# Patient Record
Sex: Female | Born: 1945 | Race: Black or African American | Hispanic: No | Smoking: Never smoker
Health system: Southern US, Community
[De-identification: ages and names within clinical notes are randomized; demographics above are authoritative.]

## PROBLEM LIST (undated history)

## (undated) DIAGNOSIS — E119 Type 2 diabetes mellitus without complications: Secondary | ICD-10-CM

## (undated) DIAGNOSIS — F329 Major depressive disorder, single episode, unspecified: Secondary | ICD-10-CM

## (undated) DIAGNOSIS — D649 Anemia, unspecified: Secondary | ICD-10-CM

## (undated) DIAGNOSIS — F32A Depression, unspecified: Secondary | ICD-10-CM

## (undated) DIAGNOSIS — I350 Nonrheumatic aortic (valve) stenosis: Secondary | ICD-10-CM

## (undated) DIAGNOSIS — H9190 Unspecified hearing loss, unspecified ear: Secondary | ICD-10-CM

## (undated) DIAGNOSIS — I639 Cerebral infarction, unspecified: Secondary | ICD-10-CM

## (undated) DIAGNOSIS — IMO0001 Reserved for inherently not codable concepts without codable children: Secondary | ICD-10-CM

## (undated) DIAGNOSIS — E785 Hyperlipidemia, unspecified: Secondary | ICD-10-CM

## (undated) DIAGNOSIS — F0390 Unspecified dementia without behavioral disturbance: Secondary | ICD-10-CM

## (undated) DIAGNOSIS — I5022 Chronic systolic (congestive) heart failure: Secondary | ICD-10-CM

## (undated) DIAGNOSIS — I219 Acute myocardial infarction, unspecified: Secondary | ICD-10-CM

## (undated) DIAGNOSIS — K219 Gastro-esophageal reflux disease without esophagitis: Secondary | ICD-10-CM

## (undated) DIAGNOSIS — I2510 Atherosclerotic heart disease of native coronary artery without angina pectoris: Secondary | ICD-10-CM

## (undated) DIAGNOSIS — N2581 Secondary hyperparathyroidism of renal origin: Secondary | ICD-10-CM

## (undated) DIAGNOSIS — I10 Essential (primary) hypertension: Secondary | ICD-10-CM

## (undated) DIAGNOSIS — N186 End stage renal disease: Secondary | ICD-10-CM

## (undated) DIAGNOSIS — Z8673 Personal history of transient ischemic attack (TIA), and cerebral infarction without residual deficits: Secondary | ICD-10-CM

## (undated) HISTORY — DX: Essential (primary) hypertension: I10

## (undated) HISTORY — DX: Type 2 diabetes mellitus without complications: E11.9

## (undated) HISTORY — DX: Unspecified dementia, unspecified severity, without behavioral disturbance, psychotic disturbance, mood disturbance, and anxiety: F03.90

## (undated) HISTORY — PX: CATARACT EXTRACTION, BILATERAL: SHX1313

## (undated) HISTORY — PX: WRIST SURGERY: SHX841

## (undated) HISTORY — DX: Unspecified hearing loss, unspecified ear: H91.90

## (undated) HISTORY — DX: Personal history of transient ischemic attack (TIA), and cerebral infarction without residual deficits: Z86.73

## (undated) HISTORY — DX: Anemia, unspecified: D64.9

## (undated) HISTORY — DX: Hyperlipidemia, unspecified: E78.5

## (undated) HISTORY — DX: Cerebral infarction, unspecified: I63.9

## (undated) HISTORY — DX: Atherosclerotic heart disease of native coronary artery without angina pectoris: I25.10

## (undated) HISTORY — DX: Chronic systolic (congestive) heart failure: I50.22

## (undated) HISTORY — DX: End stage renal disease: N18.6

## (undated) HISTORY — DX: Major depressive disorder, single episode, unspecified: F32.9

## (undated) HISTORY — DX: Gastro-esophageal reflux disease without esophagitis: K21.9

## (undated) HISTORY — DX: Depression, unspecified: F32.A

## (undated) HISTORY — DX: Secondary hyperparathyroidism of renal origin: N25.81

## (undated) HISTORY — DX: Acute myocardial infarction, unspecified: I21.9

## (undated) HISTORY — DX: Reserved for inherently not codable concepts without codable children: IMO0001

---

## 2009-11-25 ENCOUNTER — Inpatient Hospital Stay: Payer: Self-pay | Admitting: Internal Medicine

## 2009-12-20 ENCOUNTER — Encounter: Payer: Self-pay | Admitting: Family Medicine

## 2009-12-23 ENCOUNTER — Ambulatory Visit: Payer: Self-pay | Admitting: Internal Medicine

## 2010-01-18 ENCOUNTER — Other Ambulatory Visit: Payer: Self-pay | Admitting: Family Medicine

## 2010-01-31 ENCOUNTER — Other Ambulatory Visit: Payer: Self-pay | Admitting: Family Medicine

## 2012-03-24 ENCOUNTER — Inpatient Hospital Stay: Payer: Self-pay | Admitting: Internal Medicine

## 2012-03-24 LAB — COMPREHENSIVE METABOLIC PANEL
Alkaline Phosphatase: 84 U/L (ref 50–136)
Anion Gap: 14 (ref 7–16)
BUN: 67 mg/dL — ABNORMAL HIGH (ref 7–18)
Bilirubin,Total: 0.3 mg/dL (ref 0.2–1.0)
Chloride: 95 mmol/L — ABNORMAL LOW (ref 98–107)
Co2: 25 mmol/L (ref 21–32)
Creatinine: 9.04 mg/dL — ABNORMAL HIGH (ref 0.60–1.30)
EGFR (Non-African Amer.): 4 — ABNORMAL LOW
Glucose: 129 mg/dL — ABNORMAL HIGH (ref 65–99)
Potassium: 4.6 mmol/L (ref 3.5–5.1)
SGOT(AST): 27 U/L (ref 15–37)
SGPT (ALT): 19 U/L (ref 12–78)
Sodium: 134 mmol/L — ABNORMAL LOW (ref 136–145)
Total Protein: 8.3 g/dL — ABNORMAL HIGH (ref 6.4–8.2)

## 2012-03-24 LAB — CBC
HGB: 10.9 g/dL — ABNORMAL LOW (ref 12.0–16.0)
MCHC: 33.7 g/dL (ref 32.0–36.0)
Platelet: 242 10*3/uL (ref 150–440)
RDW: 17.5 % — ABNORMAL HIGH (ref 11.5–14.5)
WBC: 5.3 10*3/uL (ref 3.6–11.0)

## 2012-03-24 LAB — URINALYSIS, COMPLETE
Glucose,UR: NEGATIVE mg/dL (ref 0–75)
Protein: 100
Specific Gravity: 1.011 (ref 1.003–1.030)
Squamous Epithelial: 2
WBC UR: 16 /HPF (ref 0–5)

## 2012-03-24 LAB — CK TOTAL AND CKMB (NOT AT ARMC)
CK, Total: 128 U/L (ref 21–215)
CK-MB: 4.2 ng/mL — ABNORMAL HIGH (ref 0.5–3.6)

## 2012-03-25 LAB — LIPID PANEL
Cholesterol: 203 mg/dL — ABNORMAL HIGH (ref 0–200)
Triglycerides: 84 mg/dL (ref 0–200)
VLDL Cholesterol, Calc: 17 mg/dL (ref 5–40)

## 2012-03-25 LAB — RENAL FUNCTION PANEL
Albumin: 3.3 g/dL — ABNORMAL LOW (ref 3.4–5.0)
Anion Gap: 12 (ref 7–16)
BUN: 81 mg/dL — ABNORMAL HIGH (ref 7–18)
Chloride: 97 mmol/L — ABNORMAL LOW (ref 98–107)
Co2: 26 mmol/L (ref 21–32)
Creatinine: 10.64 mg/dL — ABNORMAL HIGH (ref 0.60–1.30)
Potassium: 4.5 mmol/L (ref 3.5–5.1)
Sodium: 135 mmol/L — ABNORMAL LOW (ref 136–145)

## 2012-03-25 LAB — PHOSPHORUS: Phosphorus: 4.8 mg/dL (ref 2.5–4.9)

## 2012-03-27 LAB — URINE CULTURE

## 2012-04-25 ENCOUNTER — Ambulatory Visit: Payer: Self-pay | Admitting: Vascular Surgery

## 2012-04-25 LAB — BASIC METABOLIC PANEL
Anion Gap: 7 (ref 7–16)
Calcium, Total: 8.7 mg/dL (ref 8.5–10.1)
Chloride: 94 mmol/L — ABNORMAL LOW (ref 98–107)
Creatinine: 6.15 mg/dL — ABNORMAL HIGH (ref 0.60–1.30)
Potassium: 4 mmol/L (ref 3.5–5.1)

## 2012-04-25 LAB — CBC
HCT: 32.5 % — ABNORMAL LOW (ref 35.0–47.0)
HGB: 10.7 g/dL — ABNORMAL LOW (ref 12.0–16.0)
MCH: 32.3 pg (ref 26.0–34.0)
MCHC: 33 g/dL (ref 32.0–36.0)
MCV: 98 fL (ref 80–100)
RDW: 18.3 % — ABNORMAL HIGH (ref 11.5–14.5)
WBC: 4 10*3/uL (ref 3.6–11.0)

## 2012-05-12 ENCOUNTER — Emergency Department: Payer: Self-pay | Admitting: Emergency Medicine

## 2012-05-12 LAB — URINALYSIS, COMPLETE
Bacteria: NONE SEEN
Bilirubin,UR: NEGATIVE
Ketone: NEGATIVE
Nitrite: NEGATIVE
Ph: 5 (ref 4.5–8.0)
Protein: >=500
RBC,UR: 4 /HPF (ref 0–5)
Specific Gravity: 1.012 (ref 1.003–1.030)
Squamous Epithelial: 2
WBC UR: 15 /HPF (ref 0–5)

## 2012-05-12 LAB — CBC
HCT: 41.3 % (ref 35.0–47.0)
HGB: 13.3 g/dL (ref 12.0–16.0)
MCV: 98 fL (ref 80–100)
RBC: 4.21 10*6/uL (ref 3.80–5.20)
WBC: 3.5 10*3/uL — ABNORMAL LOW (ref 3.6–11.0)

## 2012-05-12 LAB — COMPREHENSIVE METABOLIC PANEL
Albumin: 3.4 g/dL (ref 3.4–5.0)
Alkaline Phosphatase: 112 U/L (ref 50–136)
Anion Gap: 6 — ABNORMAL LOW (ref 7–16)
BUN: 33 mg/dL — ABNORMAL HIGH (ref 7–18)
Bilirubin,Total: 0.3 mg/dL (ref 0.2–1.0)
Calcium, Total: 8.7 mg/dL (ref 8.5–10.1)
Co2: 32 mmol/L (ref 21–32)
Creatinine: 5.88 mg/dL — ABNORMAL HIGH (ref 0.60–1.30)
EGFR (African American): 8 — ABNORMAL LOW
EGFR (Non-African Amer.): 7 — ABNORMAL LOW
Glucose: 88 mg/dL (ref 65–99)
Potassium: 3.7 mmol/L (ref 3.5–5.1)
SGPT (ALT): 18 U/L (ref 12–78)
Total Protein: 7.6 g/dL (ref 6.4–8.2)

## 2012-05-12 LAB — CK TOTAL AND CKMB (NOT AT ARMC)
CK, Total: 50 U/L (ref 21–215)
CK-MB: 1.3 ng/mL (ref 0.5–3.6)

## 2012-05-17 ENCOUNTER — Ambulatory Visit: Payer: Self-pay | Admitting: Vascular Surgery

## 2012-05-20 ENCOUNTER — Inpatient Hospital Stay: Payer: Self-pay | Admitting: Family Medicine

## 2012-05-20 LAB — URINALYSIS, COMPLETE
Glucose,UR: NEGATIVE mg/dL (ref 0–75)
Ketone: NEGATIVE
Leukocyte Esterase: NEGATIVE
Ph: 5 (ref 4.5–8.0)
Protein: >=500
RBC,UR: 3 /HPF (ref 0–5)
Specific Gravity: 1.013 (ref 1.003–1.030)
WBC UR: 1 /HPF (ref 0–5)

## 2012-05-20 LAB — TROPONIN I: Troponin-I: 0.49 ng/mL — ABNORMAL HIGH

## 2012-05-20 LAB — COMPREHENSIVE METABOLIC PANEL
Anion Gap: 9 (ref 7–16)
Bilirubin,Total: 0.4 mg/dL (ref 0.2–1.0)
Calcium, Total: 8.1 mg/dL — ABNORMAL LOW (ref 8.5–10.1)
Chloride: 98 mmol/L (ref 98–107)
EGFR (Non-African Amer.): 13 — ABNORMAL LOW
Glucose: 95 mg/dL (ref 65–99)
Osmolality: 273 (ref 275–301)
SGOT(AST): 24 U/L (ref 15–37)
Sodium: 135 mmol/L — ABNORMAL LOW (ref 136–145)
Total Protein: 7.7 g/dL (ref 6.4–8.2)

## 2012-05-20 LAB — CK TOTAL AND CKMB (NOT AT ARMC)
CK, Total: 70 U/L (ref 21–215)
CK-MB: 2.2 ng/mL (ref 0.5–3.6)

## 2012-05-20 LAB — CBC
HGB: 12.5 g/dL (ref 12.0–16.0)
MCH: 31.2 pg (ref 26.0–34.0)
MCHC: 32.9 g/dL (ref 32.0–36.0)
MCV: 95 fL (ref 80–100)
Platelet: 157 10*3/uL (ref 150–440)
RBC: 4.01 10*6/uL (ref 3.80–5.20)

## 2012-05-21 DIAGNOSIS — I359 Nonrheumatic aortic valve disorder, unspecified: Secondary | ICD-10-CM

## 2012-05-21 LAB — BASIC METABOLIC PANEL
Anion Gap: 8 (ref 7–16)
BUN: 36 mg/dL — ABNORMAL HIGH (ref 7–18)
Calcium, Total: 8.1 mg/dL — ABNORMAL LOW (ref 8.5–10.1)
Chloride: 101 mmol/L (ref 98–107)
Co2: 28 mmol/L (ref 21–32)
Creatinine: 5.44 mg/dL — ABNORMAL HIGH (ref 0.60–1.30)
Glucose: 50 mg/dL — ABNORMAL LOW (ref 65–99)
Osmolality: 279 (ref 275–301)
Potassium: 3.4 mmol/L — ABNORMAL LOW (ref 3.5–5.1)
Sodium: 137 mmol/L (ref 136–145)

## 2012-05-21 LAB — CBC WITH DIFFERENTIAL/PLATELET
HCT: 35.8 % (ref 35.0–47.0)
Lymphocyte %: 23 %
MCV: 95 fL (ref 80–100)
Monocyte #: 0.8 x10 3/mm (ref 0.2–0.9)
Monocyte %: 15.9 %
Neutrophil #: 2.8 10*3/uL (ref 1.4–6.5)
Platelet: 172 10*3/uL (ref 150–440)
RBC: 3.79 10*6/uL — ABNORMAL LOW (ref 3.80–5.20)
RDW: 17.4 % — ABNORMAL HIGH (ref 11.5–14.5)
WBC: 4.8 10*3/uL (ref 3.6–11.0)

## 2012-05-21 LAB — CK TOTAL AND CKMB (NOT AT ARMC)
CK, Total: 61 U/L (ref 21–215)
CK, Total: 62 U/L (ref 21–215)
CK-MB: 1.6 ng/mL (ref 0.5–3.6)

## 2012-05-21 LAB — TROPONIN I: Troponin-I: 0.52 ng/mL — ABNORMAL HIGH

## 2012-05-24 LAB — PHOSPHORUS: Phosphorus: 3.5 mg/dL (ref 2.5–4.9)

## 2012-05-25 LAB — PLATELET COUNT: Platelet: 201 10*3/uL (ref 150–440)

## 2012-05-27 LAB — CULTURE, BLOOD (SINGLE)

## 2012-05-31 ENCOUNTER — Emergency Department: Payer: Self-pay | Admitting: Emergency Medicine

## 2012-06-01 LAB — COMPREHENSIVE METABOLIC PANEL
Albumin: 3.5 g/dL (ref 3.4–5.0)
Alkaline Phosphatase: 114 U/L (ref 50–136)
BUN: 30 mg/dL — ABNORMAL HIGH (ref 7–18)
Co2: 32 mmol/L (ref 21–32)
EGFR (African American): 12 — ABNORMAL LOW
Osmolality: 288 (ref 275–301)
SGPT (ALT): 24 U/L (ref 12–78)
Sodium: 140 mmol/L (ref 136–145)

## 2012-06-01 LAB — URINALYSIS, COMPLETE
Bacteria: NONE SEEN
Bilirubin,UR: NEGATIVE
Glucose,UR: 50 mg/dL (ref 0–75)
Hyaline Cast: 9
Ketone: NEGATIVE
Nitrite: NEGATIVE
Ph: 7 (ref 4.5–8.0)
Protein: >=500
RBC,UR: 5 /HPF (ref 0–5)
WBC UR: 2 /HPF (ref 0–5)

## 2012-06-01 LAB — CBC
HGB: 11.3 g/dL — ABNORMAL LOW (ref 12.0–16.0)
MCHC: 32.3 g/dL (ref 32.0–36.0)
MCV: 93 fL (ref 80–100)
Platelet: 291 10*3/uL (ref 150–440)
RDW: 17.9 % — ABNORMAL HIGH (ref 11.5–14.5)

## 2012-07-30 ENCOUNTER — Ambulatory Visit: Payer: Self-pay | Admitting: Vascular Surgery

## 2012-11-05 ENCOUNTER — Ambulatory Visit: Payer: Self-pay | Admitting: Vascular Surgery

## 2012-12-10 ENCOUNTER — Emergency Department: Payer: Self-pay | Admitting: Emergency Medicine

## 2012-12-10 LAB — CBC
HCT: 34.9 % — ABNORMAL LOW (ref 35.0–47.0)
MCH: 31.2 pg (ref 26.0–34.0)
MCHC: 34.2 g/dL (ref 32.0–36.0)
Platelet: 214 10*3/uL (ref 150–440)

## 2012-12-10 LAB — COMPREHENSIVE METABOLIC PANEL
Alkaline Phosphatase: 103 U/L (ref 50–136)
BUN: 43 mg/dL — ABNORMAL HIGH (ref 7–18)
Bilirubin,Total: 0.5 mg/dL (ref 0.2–1.0)
Calcium, Total: 8.7 mg/dL (ref 8.5–10.1)
Creatinine: 5.53 mg/dL — ABNORMAL HIGH (ref 0.60–1.30)
EGFR (African American): 9 — ABNORMAL LOW
EGFR (Non-African Amer.): 7 — ABNORMAL LOW
Potassium: 3.2 mmol/L — ABNORMAL LOW (ref 3.5–5.1)
SGOT(AST): 25 U/L (ref 15–37)
SGPT (ALT): 25 U/L (ref 12–78)
Sodium: 134 mmol/L — ABNORMAL LOW (ref 136–145)
Total Protein: 7.5 g/dL (ref 6.4–8.2)

## 2012-12-10 LAB — URINALYSIS, COMPLETE
Bacteria: NONE SEEN
Glucose,UR: 50 mg/dL (ref 0–75)
Ketone: NEGATIVE
Nitrite: NEGATIVE
Ph: 5 (ref 4.5–8.0)
Protein: >=500
RBC,UR: 1 /HPF (ref 0–5)
Specific Gravity: 1.011 (ref 1.003–1.030)
Squamous Epithelial: 2

## 2013-06-29 ENCOUNTER — Observation Stay: Payer: Self-pay | Admitting: Specialist

## 2013-06-29 LAB — URINALYSIS, COMPLETE
BILIRUBIN, UR: NEGATIVE
BLOOD: NEGATIVE
Bacteria: NONE SEEN
Glucose,UR: 50 mg/dL (ref 0–75)
KETONE: NEGATIVE
LEUKOCYTE ESTERASE: NEGATIVE
Nitrite: NEGATIVE
PH: 7 (ref 4.5–8.0)
RBC,UR: 5 /HPF (ref 0–5)
Specific Gravity: 1.016 (ref 1.003–1.030)
Squamous Epithelial: <1

## 2013-06-29 LAB — BASIC METABOLIC PANEL
Anion Gap: 6 — ABNORMAL LOW (ref 7–16)
BUN: 38 mg/dL — ABNORMAL HIGH (ref 7–18)
CHLORIDE: 98 mmol/L (ref 98–107)
CO2: 32 mmol/L (ref 21–32)
Calcium, Total: 8.7 mg/dL (ref 8.5–10.1)
Creatinine: 6.08 mg/dL — ABNORMAL HIGH (ref 0.60–1.30)
EGFR (African American): 8 — ABNORMAL LOW
GFR CALC NON AF AMER: 7 — AB
GLUCOSE: 98 mg/dL (ref 65–99)
Osmolality: 281 (ref 275–301)
Potassium: 3.2 mmol/L — ABNORMAL LOW (ref 3.5–5.1)
Sodium: 136 mmol/L (ref 136–145)

## 2013-06-29 LAB — CBC
HCT: 41.8 % (ref 35.0–47.0)
HGB: 13.1 g/dL (ref 12.0–16.0)
MCH: 29.8 pg (ref 26.0–34.0)
MCHC: 31.4 g/dL — ABNORMAL LOW (ref 32.0–36.0)
MCV: 95 fL (ref 80–100)
Platelet: 163 10*3/uL (ref 150–440)
RBC: 4.41 10*6/uL (ref 3.80–5.20)
RDW: 14.1 % (ref 11.5–14.5)
WBC: 2.8 10*3/uL — ABNORMAL LOW (ref 3.6–11.0)

## 2013-06-29 LAB — TROPONIN I: TROPONIN-I: 0.02 ng/mL

## 2013-06-29 LAB — CLOSTRIDIUM DIFFICILE(ARMC)

## 2013-06-29 LAB — COMPREHENSIVE METABOLIC PANEL
ALBUMIN: 3.9 g/dL (ref 3.4–5.0)
Alkaline Phosphatase: 83 U/L
Anion Gap: 6 — ABNORMAL LOW (ref 7–16)
BUN: 32 mg/dL — AB (ref 7–18)
Bilirubin,Total: 0.4 mg/dL (ref 0.2–1.0)
CALCIUM: 9.4 mg/dL (ref 8.5–10.1)
Chloride: 95 mmol/L — ABNORMAL LOW (ref 98–107)
Co2: 32 mmol/L (ref 21–32)
Creatinine: 5.54 mg/dL — ABNORMAL HIGH (ref 0.60–1.30)
EGFR (African American): 9 — ABNORMAL LOW
EGFR (Non-African Amer.): 7 — ABNORMAL LOW
GLUCOSE: 137 mg/dL — AB (ref 65–99)
Osmolality: 275 (ref 275–301)
Potassium: 3.1 mmol/L — ABNORMAL LOW (ref 3.5–5.1)
SGOT(AST): 32 U/L (ref 15–37)
SGPT (ALT): 27 U/L (ref 12–78)
Sodium: 133 mmol/L — ABNORMAL LOW (ref 136–145)
Total Protein: 8.5 g/dL — ABNORMAL HIGH (ref 6.4–8.2)

## 2013-06-29 LAB — LIPASE, BLOOD: Lipase: 155 U/L (ref 73–393)

## 2013-06-30 LAB — CBC WITH DIFFERENTIAL/PLATELET
Basophil #: 0 10*3/uL (ref 0.0–0.1)
Basophil %: 0.5 %
Eosinophil #: 0 10*3/uL (ref 0.0–0.7)
Eosinophil %: 0.7 %
HCT: 32.3 % — ABNORMAL LOW (ref 35.0–47.0)
HGB: 10.4 g/dL — ABNORMAL LOW (ref 12.0–16.0)
Lymphocyte #: 0.6 10*3/uL — ABNORMAL LOW (ref 1.0–3.6)
Lymphocyte %: 18.1 %
MCH: 30.1 pg (ref 26.0–34.0)
MCHC: 32.1 g/dL (ref 32.0–36.0)
MCV: 94 fL (ref 80–100)
Monocyte #: 0.5 x10 3/mm (ref 0.2–0.9)
Monocyte %: 14.4 %
NEUTROS PCT: 66.3 %
Neutrophil #: 2.3 10*3/uL (ref 1.4–6.5)
Platelet: 148 10*3/uL — ABNORMAL LOW (ref 150–440)
RBC: 3.45 10*6/uL — AB (ref 3.80–5.20)
RDW: 14.3 % (ref 11.5–14.5)
WBC: 3.7 10*3/uL (ref 3.6–11.0)

## 2013-06-30 LAB — BASIC METABOLIC PANEL
ANION GAP: 9 (ref 7–16)
BUN: 45 mg/dL — AB (ref 7–18)
CALCIUM: 8.2 mg/dL — AB (ref 8.5–10.1)
CHLORIDE: 98 mmol/L (ref 98–107)
Co2: 30 mmol/L (ref 21–32)
Creatinine: 6.84 mg/dL — ABNORMAL HIGH (ref 0.60–1.30)
EGFR (African American): 7 — ABNORMAL LOW
GFR CALC NON AF AMER: 6 — AB
Glucose: 111 mg/dL — ABNORMAL HIGH (ref 65–99)
Osmolality: 286 (ref 275–301)
POTASSIUM: 2.7 mmol/L — AB (ref 3.5–5.1)
SODIUM: 137 mmol/L (ref 136–145)

## 2013-06-30 LAB — CLOSTRIDIUM DIFFICILE(ARMC)

## 2013-06-30 LAB — PHOSPHORUS: PHOSPHORUS: 3.9 mg/dL (ref 2.5–4.9)

## 2013-07-01 LAB — BASIC METABOLIC PANEL
Anion Gap: 4 — ABNORMAL LOW (ref 7–16)
BUN: 27 mg/dL — AB (ref 7–18)
CALCIUM: 8.2 mg/dL — AB (ref 8.5–10.1)
Chloride: 103 mmol/L (ref 98–107)
Co2: 32 mmol/L (ref 21–32)
Creatinine: 5.18 mg/dL — ABNORMAL HIGH (ref 0.60–1.30)
EGFR (Non-African Amer.): 8 — ABNORMAL LOW
GFR CALC AF AMER: 9 — AB
Glucose: 95 mg/dL (ref 65–99)
OSMOLALITY: 282 (ref 275–301)
POTASSIUM: 3.9 mmol/L (ref 3.5–5.1)
Sodium: 139 mmol/L (ref 136–145)

## 2013-07-01 LAB — STOOL CULTURE

## 2013-07-16 ENCOUNTER — Emergency Department: Payer: Self-pay | Admitting: Internal Medicine

## 2013-07-16 LAB — COMPREHENSIVE METABOLIC PANEL
ALBUMIN: 3.6 g/dL (ref 3.4–5.0)
ALT: 24 U/L (ref 12–78)
ANION GAP: 4 — AB (ref 7–16)
Alkaline Phosphatase: 107 U/L
BUN: 17 mg/dL (ref 7–18)
Bilirubin,Total: 0.4 mg/dL (ref 0.2–1.0)
CHLORIDE: 100 mmol/L (ref 98–107)
CREATININE: 3.04 mg/dL — AB (ref 0.60–1.30)
Calcium, Total: 8.7 mg/dL (ref 8.5–10.1)
Co2: 34 mmol/L — ABNORMAL HIGH (ref 21–32)
EGFR (African American): 18 — ABNORMAL LOW
GFR CALC NON AF AMER: 15 — AB
Glucose: 159 mg/dL — ABNORMAL HIGH (ref 65–99)
OSMOLALITY: 281 (ref 275–301)
Potassium: 3.6 mmol/L (ref 3.5–5.1)
SGOT(AST): 79 U/L — ABNORMAL HIGH (ref 15–37)
Sodium: 138 mmol/L (ref 136–145)
Total Protein: 8.1 g/dL (ref 6.4–8.2)

## 2013-07-16 LAB — CBC
HCT: 31.6 % — ABNORMAL LOW (ref 35.0–47.0)
HGB: 10.2 g/dL — ABNORMAL LOW (ref 12.0–16.0)
MCH: 30.8 pg (ref 26.0–34.0)
MCHC: 32.5 g/dL (ref 32.0–36.0)
MCV: 95 fL (ref 80–100)
PLATELETS: 208 10*3/uL (ref 150–440)
RBC: 3.33 10*6/uL — ABNORMAL LOW (ref 3.80–5.20)
RDW: 15 % — ABNORMAL HIGH (ref 11.5–14.5)
WBC: 6.8 10*3/uL (ref 3.6–11.0)

## 2013-08-29 ENCOUNTER — Ambulatory Visit: Payer: Self-pay | Admitting: Vascular Surgery

## 2014-01-01 ENCOUNTER — Ambulatory Visit: Payer: Self-pay | Admitting: Physician Assistant

## 2014-04-21 ENCOUNTER — Inpatient Hospital Stay: Payer: Self-pay | Admitting: Specialist

## 2014-04-21 LAB — CBC WITH DIFFERENTIAL/PLATELET
BASOS ABS: 0.1 10*3/uL (ref 0.0–0.1)
BASOS PCT: 2.5 %
EOS ABS: 0.1 10*3/uL (ref 0.0–0.7)
Eosinophil %: 2 %
HCT: 33 % — ABNORMAL LOW (ref 35.0–47.0)
HGB: 11 g/dL — ABNORMAL LOW (ref 12.0–16.0)
Lymphocyte #: 0.9 10*3/uL — ABNORMAL LOW (ref 1.0–3.6)
Lymphocyte %: 19.5 %
MCH: 30 pg (ref 26.0–34.0)
MCHC: 33.2 g/dL (ref 32.0–36.0)
MCV: 90 fL (ref 80–100)
MONO ABS: 0.6 x10 3/mm (ref 0.2–0.9)
Monocyte %: 12 %
NEUTROS PCT: 64 %
Neutrophil #: 3.1 10*3/uL (ref 1.4–6.5)
PLATELETS: 128 10*3/uL — AB (ref 150–440)
RBC: 3.66 10*6/uL — ABNORMAL LOW (ref 3.80–5.20)
RDW: 17.2 % — AB (ref 11.5–14.5)
WBC: 4.8 10*3/uL (ref 3.6–11.0)

## 2014-04-21 LAB — TROPONIN I
Troponin-I: 21 ng/mL — ABNORMAL HIGH
Troponin-I: 20 ng/mL — ABNORMAL HIGH

## 2014-04-21 LAB — BASIC METABOLIC PANEL
ANION GAP: 8 (ref 7–16)
BUN: 44 mg/dL — ABNORMAL HIGH (ref 7–18)
CALCIUM: 9.2 mg/dL (ref 8.5–10.1)
CO2: 33 mmol/L — AB (ref 21–32)
Chloride: 93 mmol/L — ABNORMAL LOW (ref 98–107)
Creatinine: 5.72 mg/dL — ABNORMAL HIGH (ref 0.60–1.30)
EGFR (Non-African Amer.): 8 — ABNORMAL LOW
GFR CALC AF AMER: 9 — AB
Glucose: 159 mg/dL — ABNORMAL HIGH (ref 65–99)
OSMOLALITY: 283 (ref 275–301)
Potassium: 4.4 mmol/L (ref 3.5–5.1)
Sodium: 134 mmol/L — ABNORMAL LOW (ref 136–145)

## 2014-04-21 LAB — PROTIME-INR
INR: 0.9
Prothrombin Time: 12.6 secs

## 2014-04-21 LAB — APTT: ACTIVATED PTT: 35.3 s (ref 23.6–35.9)

## 2014-04-21 LAB — CK-MB
CK-MB: 7.5 ng/mL — ABNORMAL HIGH (ref 0.5–3.6)
CK-MB: 5.2 ng/mL — ABNORMAL HIGH (ref 0.5–3.6)

## 2014-04-22 ENCOUNTER — Other Ambulatory Visit: Payer: Self-pay | Admitting: Physician Assistant

## 2014-04-22 DIAGNOSIS — E785 Hyperlipidemia, unspecified: Secondary | ICD-10-CM

## 2014-04-22 DIAGNOSIS — N186 End stage renal disease: Secondary | ICD-10-CM

## 2014-04-22 DIAGNOSIS — I340 Nonrheumatic mitral (valve) insufficiency: Secondary | ICD-10-CM

## 2014-04-22 DIAGNOSIS — I214 Non-ST elevation (NSTEMI) myocardial infarction: Secondary | ICD-10-CM

## 2014-04-22 DIAGNOSIS — I10 Essential (primary) hypertension: Secondary | ICD-10-CM

## 2014-04-22 LAB — URINALYSIS, COMPLETE
Bilirubin,UR: NEGATIVE
Blood: NEGATIVE
Glucose,UR: 150 mg/dL (ref 0–75)
Ketone: NEGATIVE
Nitrite: NEGATIVE
PH: 6 (ref 4.5–8.0)
Protein: >=500
Specific Gravity: 1.022 (ref 1.003–1.030)
Squamous Epithelial: 4
WBC UR: 23 /HPF (ref 0–5)

## 2014-04-22 LAB — CBC WITH DIFFERENTIAL/PLATELET
BASOS PCT: 1.5 %
BASOS PCT: 1.2 %
Basophil #: 0.1 10*3/uL (ref 0.0–0.1)
Basophil #: 0 10*3/uL (ref 0.0–0.1)
EOS ABS: 0.1 10*3/uL (ref 0.0–0.7)
EOS PCT: 4.3 %
Eosinophil #: 0.2 10*3/uL (ref 0.0–0.7)
Eosinophil %: 3.4 %
HCT: 24.5 % — ABNORMAL LOW (ref 35.0–47.0)
HCT: 25.5 % — AB (ref 35.0–47.0)
HGB: 8.1 g/dL — AB (ref 12.0–16.0)
HGB: 8.5 g/dL — AB (ref 12.0–16.0)
LYMPHS ABS: 0.9 10*3/uL — AB (ref 1.0–3.6)
LYMPHS ABS: 0.9 10*3/uL — AB (ref 1.0–3.6)
Lymphocyte %: 24 %
Lymphocyte %: 23.9 %
MCH: 29.5 pg (ref 26.0–34.0)
MCH: 29.7 pg (ref 26.0–34.0)
MCHC: 33 g/dL (ref 32.0–36.0)
MCHC: 33.4 g/dL (ref 32.0–36.0)
MCV: 89 fL (ref 80–100)
MCV: 89 fL (ref 80–100)
MONO ABS: 0.5 x10 3/mm (ref 0.2–0.9)
MONO ABS: 0.5 x10 3/mm (ref 0.2–0.9)
Monocyte %: 12.6 %
Monocyte %: 12.8 %
NEUTROS ABS: 2.3 10*3/uL (ref 1.4–6.5)
NEUTROS PCT: 58.5 %
Neutrophil #: 2.2 10*3/uL (ref 1.4–6.5)
Neutrophil %: 57.8 %
Platelet: 98 10*3/uL — ABNORMAL LOW (ref 150–440)
Platelet: 106 10*3/uL — ABNORMAL LOW (ref 150–440)
RBC: 2.74 10*6/uL — ABNORMAL LOW (ref 3.80–5.20)
RBC: 2.86 10*6/uL — ABNORMAL LOW (ref 3.80–5.20)
RDW: 17 % — ABNORMAL HIGH (ref 11.5–14.5)
RDW: 16.9 % — ABNORMAL HIGH (ref 11.5–14.5)
WBC: 3.9 10*3/uL (ref 3.6–11.0)
WBC: 3.7 10*3/uL (ref 3.6–11.0)

## 2014-04-22 LAB — RENAL FUNCTION PANEL
ANION GAP: 4 — AB (ref 7–16)
Albumin: 2.8 g/dL — ABNORMAL LOW (ref 3.4–5.0)
BUN: 52 mg/dL — AB (ref 7–18)
CALCIUM: 8.1 mg/dL — AB (ref 8.5–10.1)
Chloride: 93 mmol/L — ABNORMAL LOW (ref 98–107)
Co2: 35 mmol/L — ABNORMAL HIGH (ref 21–32)
Creatinine: 6.6 mg/dL — ABNORMAL HIGH (ref 0.60–1.30)
GFR CALC AF AMER: 8 — AB
GFR CALC NON AF AMER: 7 — AB
Glucose: 195 mg/dL — ABNORMAL HIGH (ref 65–99)
OSMOLALITY: 284 (ref 275–301)
Phosphorus: 3.9 mg/dL (ref 2.5–4.9)
Potassium: 4.5 mmol/L (ref 3.5–5.1)
SODIUM: 132 mmol/L — AB (ref 136–145)

## 2014-04-22 LAB — BASIC METABOLIC PANEL
ANION GAP: 10 (ref 7–16)
BUN: 50 mg/dL — AB (ref 7–18)
CHLORIDE: 92 mmol/L — AB (ref 98–107)
Calcium, Total: 8.8 mg/dL (ref 8.5–10.1)
Co2: 32 mmol/L (ref 21–32)
Creatinine: 6.23 mg/dL — ABNORMAL HIGH (ref 0.60–1.30)
EGFR (African American): 9 — ABNORMAL LOW
EGFR (Non-African Amer.): 7 — ABNORMAL LOW
Glucose: 91 mg/dL (ref 65–99)
Osmolality: 281 (ref 275–301)
POTASSIUM: 4.1 mmol/L (ref 3.5–5.1)
Sodium: 134 mmol/L — ABNORMAL LOW (ref 136–145)

## 2014-04-22 LAB — LIPID PANEL
Cholesterol: 256 mg/dL — ABNORMAL HIGH (ref 0–200)
HDL: 73 mg/dL — AB (ref 40–60)
Ldl Cholesterol, Calc: 179 mg/dL — ABNORMAL HIGH (ref 0–100)
TRIGLYCERIDES: 22 mg/dL (ref 0–200)
VLDL CHOLESTEROL, CALC: 4 mg/dL — AB (ref 5–40)

## 2014-04-22 LAB — MAGNESIUM: MAGNESIUM: 2.1 mg/dL

## 2014-04-22 LAB — HEPARIN LEVEL (UNFRACTIONATED)
ANTI-XA(UNFRACTIONATED): 0.75 [IU]/mL — AB (ref 0.30–0.70)
Anti-Xa(Unfractionated): 0.22 IU/mL — ABNORMAL LOW (ref 0.30–0.70)

## 2014-04-22 LAB — HEMOGLOBIN A1C: Hemoglobin A1C: 5.1 % (ref 4.2–6.3)

## 2014-04-22 LAB — TSH: Thyroid Stimulating Horm: 1.08 u[IU]/mL

## 2014-04-22 LAB — TROPONIN I: Troponin-I: 21 ng/mL — ABNORMAL HIGH

## 2014-04-22 LAB — CK-MB: CK-MB: 4.3 ng/mL — AB (ref 0.5–3.6)

## 2014-04-23 LAB — CBC WITH DIFFERENTIAL/PLATELET
Basophil #: 0.1 10*3/uL (ref 0.0–0.1)
Basophil %: 1.5 %
EOS PCT: 7.7 %
Eosinophil #: 0.3 10*3/uL (ref 0.0–0.7)
HCT: 25.7 % — ABNORMAL LOW (ref 35.0–47.0)
HGB: 8.4 g/dL — ABNORMAL LOW (ref 12.0–16.0)
Lymphocyte #: 1.2 10*3/uL (ref 1.0–3.6)
Lymphocyte %: 33 %
MCH: 29.5 pg (ref 26.0–34.0)
MCHC: 32.7 g/dL (ref 32.0–36.0)
MCV: 90 fL (ref 80–100)
MONOS PCT: 10.7 %
Monocyte #: 0.4 x10 3/mm (ref 0.2–0.9)
NEUTROS ABS: 1.7 10*3/uL (ref 1.4–6.5)
Neutrophil %: 47.1 %
Platelet: 115 10*3/uL — ABNORMAL LOW (ref 150–440)
RBC: 2.84 10*6/uL — ABNORMAL LOW (ref 3.80–5.20)
RDW: 17 % — AB (ref 11.5–14.5)
WBC: 3.7 10*3/uL (ref 3.6–11.0)

## 2014-04-23 LAB — HEPARIN LEVEL (UNFRACTIONATED): Anti-Xa(Unfractionated): 0.33 IU/mL (ref 0.30–0.70)

## 2014-04-24 LAB — BASIC METABOLIC PANEL
Anion Gap: 9 (ref 7–16)
BUN: 48 mg/dL — AB (ref 7–18)
CHLORIDE: 94 mmol/L — AB (ref 98–107)
CREATININE: 5.9 mg/dL — AB (ref 0.60–1.30)
Calcium, Total: 8.1 mg/dL — ABNORMAL LOW (ref 8.5–10.1)
Co2: 30 mmol/L (ref 21–32)
EGFR (Non-African Amer.): 8 — ABNORMAL LOW
GFR CALC AF AMER: 9 — AB
Glucose: 111 mg/dL — ABNORMAL HIGH (ref 65–99)
Osmolality: 280 (ref 275–301)
Potassium: 4.5 mmol/L (ref 3.5–5.1)
Sodium: 133 mmol/L — ABNORMAL LOW (ref 136–145)

## 2014-04-24 LAB — CBC WITH DIFFERENTIAL/PLATELET
BASOS ABS: 0.1 10*3/uL (ref 0.0–0.1)
Basophil #: 0 10*3/uL (ref 0.0–0.1)
Basophil %: 1.5 %
Basophil %: 1 %
EOS PCT: 6.4 %
Eosinophil #: 0.3 10*3/uL (ref 0.0–0.7)
Eosinophil #: 0.2 10*3/uL (ref 0.0–0.7)
Eosinophil %: 4.7 %
HCT: 24.3 % — ABNORMAL LOW (ref 35.0–47.0)
HCT: 25.1 % — ABNORMAL LOW (ref 35.0–47.0)
HGB: 8.1 g/dL — AB (ref 12.0–16.0)
HGB: 8.3 g/dL — ABNORMAL LOW (ref 12.0–16.0)
LYMPHS ABS: 0.7 10*3/uL — AB (ref 1.0–3.6)
Lymphocyte #: 0.9 10*3/uL — ABNORMAL LOW (ref 1.0–3.6)
Lymphocyte %: 23.3 %
Lymphocyte %: 16.9 %
MCH: 30 pg (ref 26.0–34.0)
MCH: 29.6 pg (ref 26.0–34.0)
MCHC: 33.2 g/dL (ref 32.0–36.0)
MCHC: 33 g/dL (ref 32.0–36.0)
MCV: 90 fL (ref 80–100)
MCV: 90 fL (ref 80–100)
Monocyte #: 0.5 x10 3/mm (ref 0.2–0.9)
Monocyte #: 0.4 x10 3/mm (ref 0.2–0.9)
Monocyte %: 11.7 %
Monocyte %: 9.7 %
NEUTROS ABS: 2.8 10*3/uL (ref 1.4–6.5)
NEUTROS PCT: 57.1 %
Neutrophil #: 2.3 10*3/uL (ref 1.4–6.5)
Neutrophil %: 67.7 %
Platelet: 122 10*3/uL — ABNORMAL LOW (ref 150–440)
Platelet: 123 10*3/uL — ABNORMAL LOW (ref 150–440)
RBC: 2.7 10*6/uL — ABNORMAL LOW (ref 3.80–5.20)
RBC: 2.8 10*6/uL — ABNORMAL LOW (ref 3.80–5.20)
RDW: 16.4 % — ABNORMAL HIGH (ref 11.5–14.5)
RDW: 16.7 % — ABNORMAL HIGH (ref 11.5–14.5)
WBC: 4 10*3/uL (ref 3.6–11.0)
WBC: 4.1 10*3/uL (ref 3.6–11.0)

## 2014-04-24 LAB — RENAL FUNCTION PANEL
ANION GAP: 10 (ref 7–16)
Albumin: 2.7 g/dL — ABNORMAL LOW (ref 3.4–5.0)
BUN: 48 mg/dL — ABNORMAL HIGH (ref 7–18)
CALCIUM: 8.1 mg/dL — AB (ref 8.5–10.1)
Chloride: 93 mmol/L — ABNORMAL LOW (ref 98–107)
Co2: 30 mmol/L (ref 21–32)
Creatinine: 6.12 mg/dL — ABNORMAL HIGH (ref 0.60–1.30)
EGFR (African American): 9 — ABNORMAL LOW
EGFR (Non-African Amer.): 7 — ABNORMAL LOW
GLUCOSE: 229 mg/dL — AB (ref 65–99)
OSMOLALITY: 286 (ref 275–301)
PHOSPHORUS: 2.9 mg/dL (ref 2.5–4.9)
Potassium: 4.5 mmol/L (ref 3.5–5.1)
Sodium: 133 mmol/L — ABNORMAL LOW (ref 136–145)

## 2014-04-27 ENCOUNTER — Telehealth: Payer: Self-pay

## 2014-04-27 NOTE — Telephone Encounter (Signed)
Patient contacted regarding discharge from [facility] on 04/24/14. Spoke w/ pt's daughter, Vena Austrialeanor.  Patient's daughter understands to follow up with Dr. Mariah MillingGollan on 04/30/14 at 3:15 at [facility]. Patient's daughter understands discharge instructions? yes Patient's daughter understands medications and regiment? yes Patient's daughter understands to bring all medications to this visit? yes  [name] reports that pt has dementia, is resident at [facility] and they will ensure that she gets to her appt.

## 2014-04-30 ENCOUNTER — Encounter: Payer: Medicare Other | Admitting: Cardiovascular Disease

## 2014-05-12 ENCOUNTER — Ambulatory Visit (INDEPENDENT_AMBULATORY_CARE_PROVIDER_SITE_OTHER): Payer: Medicare Other | Admitting: Cardiovascular Disease

## 2014-05-12 ENCOUNTER — Encounter: Payer: Self-pay | Admitting: Cardiovascular Disease

## 2014-05-12 VITALS — BP 110/62 | HR 67 | Ht 63.0 in | Wt 128.0 lb

## 2014-05-12 DIAGNOSIS — I5022 Chronic systolic (congestive) heart failure: Secondary | ICD-10-CM | POA: Insufficient documentation

## 2014-05-12 DIAGNOSIS — E1159 Type 2 diabetes mellitus with other circulatory complications: Secondary | ICD-10-CM | POA: Insufficient documentation

## 2014-05-12 DIAGNOSIS — I350 Nonrheumatic aortic (valve) stenosis: Secondary | ICD-10-CM | POA: Insufficient documentation

## 2014-05-12 DIAGNOSIS — N186 End stage renal disease: Secondary | ICD-10-CM | POA: Insufficient documentation

## 2014-05-12 DIAGNOSIS — I214 Non-ST elevation (NSTEMI) myocardial infarction: Secondary | ICD-10-CM

## 2014-05-12 DIAGNOSIS — I159 Secondary hypertension, unspecified: Secondary | ICD-10-CM | POA: Insufficient documentation

## 2014-05-12 DIAGNOSIS — Z992 Dependence on renal dialysis: Secondary | ICD-10-CM

## 2014-05-12 NOTE — Assessment & Plan Note (Signed)
Currently with no symptoms of angina. Medications discussed in detail with her daughter. No further medication titration. Blood pressure within a reasonable range. 120 systolic today, 115 on my check

## 2014-05-12 NOTE — Assessment & Plan Note (Signed)
We have placed in order to hold her blood pressure medications prior to hemodialysis Monday, Wednesday, Friday

## 2014-05-12 NOTE — Assessment & Plan Note (Signed)
Ejection fraction 40-50% on recent echocardiogram, basal to mid inferior wall hypokinesis likely from CAD, RCA territory

## 2014-05-12 NOTE — Assessment & Plan Note (Signed)
We have encouraged continued exercise, careful diet management  Hemoglobin A1c seems to be well controlled

## 2014-05-12 NOTE — Assessment & Plan Note (Signed)
Prominent murmur on exam likely secondary to aortic valve stenosis, severe LVH. Likely  with outflow tract gradient . Relatively asymptomatic. We'll monitor for now

## 2014-05-12 NOTE — Patient Instructions (Signed)
You are doing well. No medication changes were made.  Please hold blood pressure medications the morning of hemodialysis (Monday, Wednesday and Friday)  Please call us if you have new issues that need to be addressed before your next appt.  Your physician wants you to follow-up in: 3 months.  You will receive a reminder letter in the mail two months in advance. If you don't receive a letter, please call our office to schedule the follow-up appointment.

## 2014-05-12 NOTE — Progress Notes (Signed)
Patient ID: [name], female    DOB: 03/23/1945, 69 y.o.   MRN: [id number]  HPI Comments: [name] is a pleasant 69 year old woman with end-stage renal disease on HD on (Monday, Wednesday, Friday), HTN, hyperlipidemia, who presented to [facility] with weakness, vomiting, with non-ST elevation MI, troponin 21, managed medically, ejection fraction 40-50% with basal to mid inferior wall hypokinesis, moderate aortic valve stenosis, severe LVH, moderate pulmonary hypertension. She presents to establish care in the [place] office  In follow-up today she presents with her daughter. Daughter has blood pressures from the nursing home. In general systolic pressure 120 up to 150 Patient reports no significant symptoms. No chest pain or shortness of breath with exertion Daughter does not feel that she is eating well. No dramatic weight loss.  Notes from primary care indicate a history of diabetes, chronic dementia, deconditioning, chronic constipation, GERD Lab work showing total cholesterol 278, LDL 167, hemoglobin A1c 5.8  EKG shows normal sinus rhythm     No Known Allergies  Outpatient Encounter Prescriptions as of 05/12/2014  Medication Sig  . aspirin 81 MG tablet Take 81 mg by mouth daily.  [name]. atorvastatin (LIPITOR) 20 MG tablet Take 20 mg by mouth daily.  . Calcium Acetate, Phos Binder, 667 MG CAPS Take by mouth 3 (three) times daily.  . clopidogrel (PLAVIX) 75 MG tablet Take 75 mg by mouth daily.  [name]. docusate sodium (COLACE) 100 MG capsule Take 100 mg by mouth daily.  . ergocalciferol (VITAMIN D2) 50000 UNITS capsule Take 50,000 Units by mouth every 30 (thirty) days.   [name]. ezetimibe (ZETIA) 10 MG tablet Take 10 mg by mouth daily.  . hydrALAZINE (APRESOLINE) 25 MG tablet Take 25 mg by mouth 3 (three) times daily.  . isosorbide dinitrate (ISORDIL) 30 MG tablet Take 30 mg by mouth 4 (four) times daily.  [name]. lisinopril (PRINIVIL,ZESTRIL) 20 MG tablet Take 20 mg by mouth daily.  . metoprolol  tartrate (LOPRESSOR) 25 MG tablet Take 25 mg by mouth 2 (two) times daily.  . promethazine (PHENERGAN) 25 MG tablet Take 25 mg by mouth every 6 (six) hours as needed for nausea or vomiting.  . ranitidine (ZANTAC) 150 MG capsule Take 150 mg by mouth every evening.  . [DISCONTINUED] isosorbide mononitrate (IMDUR) 30 MG 24 hr tablet Take 30 mg by mouth daily.    Past Medical History  Diagnosis Date  . Stroke   . MI (myocardial infarction)   . Hearing impaired   . ESRD (end stage renal disease)   . Hyperlipidemia   . Hypertension   . Diabetes mellitus without complication   . Depression   . Coronary artery disease   . Dementia   . Secondary hyperparathyroidism   . Dialysis patient     Monday, Wednesday and Friday.   . Chronic systolic CHF (congestive heart failure)   . GERD (gastroesophageal reflux disease)   . Personal history of transient ischemic attack (TIA) and cerebral infarction without residual deficit   . Anemia     Past Surgical History  Procedure Laterality Date  . Cataract extraction, bilateral    . Wrist surgery      Social History  reports that she has never smoked. She does not have any smokeless tobacco history on file. She reports that she does not drink alcohol or use illicit drugs.  Family History Family history is unknown by patient.   Review of Systems  Constitutional: Negative.   Respiratory: Negative.   Cardiovascular: Negative.   Gastrointestinal:  Negative.   Musculoskeletal: Negative.   Skin: Negative.   Neurological: Negative.   Hematological: Negative.   Psychiatric/Behavioral: Negative.        Relatively nonverbal  All other systems reviewed and are negative.  BP 110/62 mmHg  Pulse 67  Ht  (1.6 m)  Wt 128 lb (58.06 kg)  BMI 22.68 kg/m2   Physical Exam  Constitutional: She is oriented to person, place, and time. She appears well-developed and well-nourished.  HENT:  Head: Normocephalic.  Nose: Nose normal.  Mouth/Throat:  Oropharynx is clear and moist.  Eyes: Conjunctivae are normal. Pupils are equal, round, and reactive to light.  Neck: Normal range of motion. Neck supple. No JVD present.  Cardiovascular: Normal rate, regular rhythm, S1 normal, S2 normal, normal heart sounds and intact distal pulses.  Exam reveals no gallop and no friction rub.   No murmur heard. Pulmonary/Chest: Effort normal and breath sounds normal. No respiratory distress. She has no wheezes. She has no rales. She exhibits no tenderness.  Abdominal: Soft. Bowel sounds are normal. She exhibits no distension. There is no tenderness.  Musculoskeletal: Normal range of motion. She exhibits no edema or tenderness.  Lymphadenopathy:    She has no cervical adenopathy.  Neurological: She is alert and oriented to person, place, and time. Coordination normal.  Skin: Skin is warm and dry. No rash noted. No erythema.  Psychiatric: She has a normal mood and affect. Her behavior is normal. Judgment and thought content normal.    Assessment and Plan  Nursing note and vitals reviewed.

## 2014-05-12 NOTE — Assessment & Plan Note (Signed)
Blood pressure is well controlled on today's visit. No changes made to the medications. 

## 2014-07-10 NOTE — Op Note (Signed)
PATIENT NAMELASHE, [name] MR#:  [id number] DATE OF BIRTH:  [birth year]  DATE OF PROCEDURE:  07/30/2012  PREOPERATIVE DIAGNOSES: 1.  End-stage renal disease requiring hemodialysis.  2.  Poorly functioning dialysis access.   POSTOPERATIVE DIAGNOSES:   1.  End-stage renal disease requiring hemodialysis.  2.  Poorly functioning dialysis access.   PROCEDURES PERFORMED: 1.  Left forearm AV loop graft angiography.  2.  Percutaneous transluminal angioplasty of the venous anastomosis to 7 mm.  3.  Percutaneous transluminal angioplasty of the brachial artery to 7 mm separate and distinct lesion.   SURGEON: Levora Dredge, M.D.   SEDATION: Versed 4 mg plus fentanyl 150 mcg administered IV. Continuous ECG, pulse oximetry and cardiopulmonary monitoring is performed throughout the entire procedure by the interventional radiology nurse.   TOTAL SEDATION TIME: One hour.   ACCESS:  1.  A 6 French sheath, antegrade direction, left forearm loop graft.  2.  A 6 French sheath, retrograde direction, left forearm loop graft.   CONTRAST USED: Isovue 42 mL.   FLUOROSCOPY TIME: 2.8 minutes.   INDICATIONS: [name] is a 69 year old woman who presented to the office, referred from dialysis with increasing problems with dialysis, decreasing KT/v and prolonged bleeding. Physical examination as well as noninvasive studies demonstrated a stricture at the venous anastomosis. Risks and benefits for angiography and intervention were reviewed. All questions answered. The patient has agreed to proceed.   DESCRIPTION OF PROCEDURE: The patient is taken to special procedures and placed in the supine position. After adequate sedation is achieved, she is positioned supine with her left Charlene extended palm upward. The left Charlene is prepped and draped in a sterile fashion.   1% lidocaine is infiltrated in the soft tissues overlying the graft and access to the lateral loop of the graft is obtained.  This is in the  retrograde direction. Therefore, 1% lidocaine is infiltrated into the soft tissues overlying the medial limb of the loop graft and access is obtained in an antegrade direction, micro wire followed by micro sheath, J-wire followed by a 6 French sheath. Imaging is then performed of the graft as well as the venous outflow. After review of the images, 3000 units of heparin are given.   Magic torque wire is then advanced through the antegrade sheath across this stricture, which is approximately 90%. It is strictured at a bifurcation of the antecubital crossing vein and cephalic vein. The true cephalic vein now appears to be at least 4 to 6 mm in diameter and appears patent throughout its course. The antecubital crossing vein appears to fill the basilic vein in the mid forearm.   With the wire negotiated into the basilic distribution,  first a 5 x 4 balloon then a 6 x 4, but ultimately a 7 x 2 balloon is required to angioplasty the venous anastomosis. All inflations are to 16 to 20 atmospheres for 1 to 2 minutes. Follow-up angiography demonstrates an excellent result, but there now appears to be a lesion noted in the mid brachial, approximately mid biceps level, approximately 20 cm from this lesion. Several doses of nitroglycerin were utilized to try to relax the vein to eliminate this narrowing. However, these do not alter the vein and I believe this is actually a stricture of a valve. Therefore, the 7 mm balloon is reintroduced over the wire and advanced across this narrowing and used to angioplasty the narrowed area to a 7 mm for an inflation of 1 minute; 18 atmospheres was utilized.  After removing balloon, follow-up angiography demonstrates complete resolution of this lesion as well. Reflux images demonstrate the arterial portion is widely patent.   Pursestring suture of 4-0 Monocryl are then placed from both sheaths ,the sheaths are removed, light pressure is held and there are no immediate complications.    INTERPRETATION: The forearm loop graft appears to be in excellent condition. At the venous anastomosis, there is a high-grade stricture/stenosis greater than 90%. This is encompassing a bifurcation, where the true cephalic vein meets an antecubital crossing vein. Antecubital fills the basilic. The more proximal cephalic and basilic veins are widely patent, as is the axillary and subclavian and the central veins as well. Arterial portion is widely patent. After angioplasty of the venous anastomosis, there is now excellent luminal gain, it now matches the graft itself and therefore appears to be a full 7 cm. There is lesion noted in the mid basilic vein and this is angioplastied to 7 mm as well with an excellent result.   SUMMARY: Successful salvage of a left Charlene forearm loop graft. There now appears to have been significant maturation of the cephalic vein and it appears to be quite nice and readily usable for a fistula. Should the durability of this angioplasty fall short and continued problems occur with the graft, then clearly the best option would be to create a left Charlene brachiocephalic fistula. This was discussed with the family.    ____________________________ Charlene DillsGregory G. Jonas Goh, MD ggs:cc D: 07/31/2012 17:41:45 ET T: 07/31/2012 19:41:42 ET JOB#: [id number]  cc: Charlene DillsGregory G. Bless Belshe, MD, <Dictator> Charlene DillsGREGORY G Kacper Cartlidge MD ELECTRONICALLY SIGNED 08/06/2012 9:26

## 2014-07-10 NOTE — Consult Note (Signed)
Brief Consult Note: Diagnosis: Recurrent strokes.   Patient was seen by consultant.   Consult note dictated.   Comments: - first stroke in 01/2010 - right putamen (caudate body). Came back again on 03/24/12 for many days of not acting right. - Since then has developed left occipital, left parietal and left frontal infracts. - New ischemic areas are small (subactue) left occipital and left frontal (extension of previou infracts). - likely mid size vessel disease with hypotension (undocumented? unrecognized) event. - Consider MRA (without contrast) - reviewed rest of the stroke w/up ECHO - LVH, US carotid - ok. - Agree with ASA + Plavix, statin, gentle BP conrol, avoid hypotension, fever. - consider flu shot, multivitamines etc. - will follow less frequenlty.  Electronic Signatures: Jolene ProvostShah, Hemang Kalpeshkumar (MD)  (Signed 06-Jan-14 15:07)  Authored: Brief Consult Note   Last Updated: 06-Jan-14 15:07 by Jolene ProvostShah, Hemang Kalpeshkumar (MD)

## 2014-07-10 NOTE — Consult Note (Signed)
PATIENT NAME:  [name], [name] MR#:  [id number] DATE OF BIRTH:  1945-12-29  DATE OF CONSULTATION:  03/25/2012  REQUESTING PHYSICIAN:  Shaune PollackQing Chen, MD CONSULTING PHYSICIAN:  Caeli Linehan Lizabeth LeydenN. Noely Kuhnle, MD  REASON FOR CONSULTATION: Evaluation and management of end-stage renal disease in a hemodialysis patient.   HISTORY OF PRESENT ILLNESS: The patient is a 69 year old African American female with past medical history of hypertension, diabetes mellitus, hyperlipidemia, prior CVA, history of end-stage renal disease on hemodialysis Monday, Wednesday, and Friday and anemia of chronic kidney disease who presented to [facility] on 03/24/2012 with decreased mental status. Initial work-up revealed an acute to subacute CVA. There appeared to be basal ganglia lacunar infarcts. There was also a possibility of subacute left thalamic infarct. She is undergoing further work-up with MRI. This was performed this a.m. which demonstrated a small acute nonhemorrhagic left frontal lobe infarct adjacent to the left frontal horn of the lateral ventricle. The patient appears to be a very poor historian at this time. She cannot explain as to why she is currently here. In regards to her end-stage renal disease, the patient dialyzes at the [facility] on Monday, Wednesday, Friday. [facility]. Her estimated dry weight is 60.5 kg.  Her weight currently is 60.5 kg.  She also has associated anemia of chronic kidney disease for which she receives Epogen 1000 units IV weekly. She has a right internal jugular PermCath used for dialysis. She last dialyzed on Friday.   PAST MEDICAL HISTORY: 1.  Hypertension.  2.  Diabetes mellitus.  3. End-stage renal disease on hemodialysis Monday, Wednesday and Friday followed at the [facility] by [facility] with estimated dry weight of 60.5 kg.  4.  Anemia of chronic kidney disease.  5.  Hyperlipidemia.  6.  Prior CVA. 7.  Secondary  hyperparathyroidism.   PAST SURGICAL HISTORY: Right internal jugular PermCath placement.   SOCIAL HISTORY: The patient [place] in [place]. She lives with her daughter. No reported tobacco, alcohol or illicit drug use. [place] used to previously [place] in [place].   FAMILY HISTORY: The patient states that her father deceased of a myocardial infarction.   CURRENT INPATIENT MEDICATIONS:  Include acetaminophen 325 mg p.o. every 4 hours p.r.n., aspirin 325 mg daily, atorvastatin 80 mg p.o. at bedtime, Coreg 6.25 mg p.o. q.12 hours, ceftriaxone 1 gram IV every 24 hours, Plavix 75 mg daily, Colace 100 mg p.o. b.i.d., heparin 5000 units subcutaneous q.12 hours, sliding scale insulin, morphine 2 to 4 mg IV every 4 hours p.r.n. pain, Zofran 4 mg IV every 4 hours p.r.n., Protonix 40 mg p.o. every 6:00 a.m., Zantac 150 mg p.o. at bedtime.   ALLERGIES: No known drug allergies.   REVIEW OF SYSTEMS:  CONSTITUTIONAL: Denies fevers, chills or weight loss.  EYES: Denies diplopia, blurry vision.  HEENT: Denies headaches, hearing loss. Denies epistaxis.  CARDIOVASCULAR: Denies chest pain, palpitations, PND.  RESPIRATORY: Denies cough, shortness of breath or hemoptysis.  GASTROINTESTINAL: Denies nausea, vomiting, diarrhea.  GENITOURINARY: Denies frequency or urgency. Does have history of end-stage renal disease.  MUSCULOSKELETAL: Denies joint pain, swelling or redness.  INTEGUMENTARY: Denies skin rashes or lesions.  NEUROLOGIC: Reports prior history of CVA.  PSYCHIATRIC: Denies depression or bipolar.  ENDOCRINE: Has history of diabetes mellitus.  HEMATOLOGIC/LYMPHATIC: Has history of anemia of chronic kidney disease. ALLERGY/IMMUNOLOGIC: Denies seasonal allergies or history of immunodeficiency.   PHYSICAL EXAMINATION: VITAL SIGNS: Temperature 98, pulse 69, respirations 18, blood pressure 143/82.  GENERAL:  Well-developed, well-nourished African American female who appears her stated age, currently in no acute  distress.  HEENT: Normocephalic, atraumatic. Extraocular movements appear to be intact. Pupils are equal, round and reactive to light. No scleral icterus. Conjunctivae are pink. No epistaxis noted. Gross hearing intact. Oral mucosa moist.  NECK: Supple without JVD or lymphadenopathy.  LUNGS: Clear to auscultation bilaterally with normal respiratory effort.  HEART: S1, S2 regular rate and rhythm. No murmurs, rubs or gallops appreciated.   ABDOMEN: Soft, nontender, nondistended. Bowel sounds positive. No rebound or guarding. No gross organomegaly appreciated.  EXTREMITIES: No clubbing, cyanosis or edema.  NEUROLOGIC: The patient is awake and alert. She is spontaneously moving her upper and lower extremities. She does appear to be confused about the history of present illness, however.  SKIN: Warm and dry. No rashes noted.  MUSCULOSKELETAL: No joint redness, swelling or tenderness appreciated.  PSYCHIATRIC: The patient is with appropriate affect. No appears to have very poor insight into her current illness.   LABORATORY AND DIAGNOSTIC DATA: MRI of the brain shows acute nonhemorrhagic small left prior occipital lobe infarct at the periphery of an old infarct.  There is also a small acute nonhemorrhagic left frontal lobe infarct adjacent to the left frontal horn of the lateral ventricle.  Lipid profile shows cholesterol 203, triglycerides 84, HDL 66, LDL 120. Urinalysis shows urine protein 100 mg/dL, 2 RBCs per high-power field, 16 WBCs per high-power field. CBC shows WBC 5.3, hemoglobin 10.9, hematocrit 32.4, platelets 242.  CMP shows sodium 134, potassium is 4.6, chloride 95, CO2 25, BUN 67 creatinine 9.04, glucose 429, total protein 8.3, alkaline phosphatase 84, ALT 19, AST 27.   IMPRESSION:  This is a 69 year old African American female with past medical history of hypertension, diabetes mellitus, end-stage renal disease on hemodialysis Monday, Wednesday, and Friday followed at the [facility] by [facility] nephrology, hyperlipidemia, cerebrovascular accident who  presents now with acute cerebrovascular accident of the left frontal lobe as well as acute nonhemorrhagic small left prior occipital lobe infarct at the periphery of an old infarct.  1.  End-stage renal disease on hemodialysis Monday, Wednesday and Friday. The patient is normally followed at the [facility] by [place] [facility]rology. The patient's estimated dry weight is 60.5 kg which she appears to be at presently. We will perform hemodialysis today with attempted ultrafiltration 0.5 kg.  We will use her right internal jugular PermCath. Will avoid heparin for now.  2.  Anemia of chronic kidney disease. Hemoglobin is currently acceptable at 10.9. We will hold the Epogen for now given the acute cerebrovascular accident.  3.  Secondary hyperparathyroidism. We will check intact PTH and phosphorus during dialysis today.  4.  Acute cerebrovascular accident. The patient appears to have cerebrovascular accident in 2 regions of the brain. Would recommend neurology consultation for this, however, the hospitalist is currently following.  5.  I would like to thank Dr. Imogene Burn for this kind referral. Further plan as the patient progresses.     ____________________________ Lennox Pippins, MD mnl:ct D: 03/25/2012 11:59:26 ET T: 03/25/2012 12:23:38 ET JOB#: [id number]  cc: Lennox Pippins, MD, <Dictator> Lennox Pippins MD ELECTRONICALLY SIGNED 04/26/2012 22:46

## 2014-07-10 NOTE — Discharge Summary (Signed)
PATIENT NAMEKINSLEY, [name] MR#:  [id number] DATE OF BIRTH:  06/01/1945  DATE OF ADMISSION:  05/20/2012  DATE OF DISCHARGE:  05/24/2012.  REASON FOR ADMISSION:  Syncope associated with hypotension.  DISCHARGE DIAGNOSES: 1.  Syncope associated with hypotension. 2.  Right-sided weakness secondary to hypotension due to transient ischemic attack with history of multiple narrowing of multiple blood vessels on the brain.  3.  End-stage renal disease.  4.  Bronchitis.  5.  Elevated troponin due to demand ischemia.  6.  A history of cerebrovascular accident.  7.  Chronic right side weakness.  8.  Right forearm pain status post recent graft placement. No signs of infection. Blood cultures are negative. The patient received about two doses of vancomycin. No need to continue that.  9.  Hypertension.  10.  Insulin-dependent diabetes.  11. Vascular dementia.   LABORATORY, DIAGNOSTIC, AND RADIOLOGICAL DATA:  1.  Blood sugars running in between 100 to 279, but overall, the last several days around 100 to 150. Creatinine of admission 3.47, sodium 135, potassium 3.3, hypokalemia has been repleted. 2.  LFTs within normal limits.  3.  Troponins 0.49 then 0.48 then 0.52.  4.  Hemoglobin 11.7. White count 4.8. Blood cultures no growth x 2.  5.  URINALYSIS:  No signs of infection.  6.  PTH 177.  7.  Hepatitis B antigen negative.   EKG:  Normal sinus rhythm without any significant ST depression or elevation. She has a prolonged QT.   CONSULTANTS:  Dr. Mady Roberts, and Dr. Levora Roberts.   DISPOSITION:  Home with Home Health.   MEDICATIONS:  Zocor 5 mg once a day, Zetia 10 mg once a day, Plavix 75 mg once a day, Norvasc 2.5 mg once a day, Lantus 10 units once a day, Colace 100 mg take 2 at bedtime, Zantac 150 mg once a day, melatonin 5 mg once a day, vitamin D 500 units once a month, fish oil 1000 mg twice a day, Norco 325/5, take 1 to 2 every 6 hours, renal vitamins with B complex once a day,  Renvela take 1600 mg 3 times a day and one with snacks, vitamin B12 once a day and aspirin 81 mg once a day.  DISCHARGE INSTRUCTIONS:  The patient discharge with recommendation of [facility]  DIET:  Carbohydrate controlled renal diet.  FOLLOW UP:  Dr. Levora Roberts, Dr. Mady Roberts and [name] in the next 1 to 2 weeks.    HOSPITAL COURSE:  The patient is a very nice 69 year old female who was admitted on 05/20/2012 after an episode of syncope and hypotension. The patient has history of high blood pressure, diabetes. She had a CVA of the frontal lobe moderate high-grade diffuse narrowing of the arteries surrounding that area. She has end-stage renal disease, and she showed up to the Emergency Room after dialysis with a blood pressure of 88/41. The patient had 2.3 L removed that day and had a bolus of 250 mL, and she is starting to feel better. She presented with worsening right-sided weakness, which actually resolved after the IV fluids. She has not had any infection or chills. She had an AV graft placed on the left upper extremity three days prior by [name] and Dr. Gilda Roberts. The patient was also seen by Neurology, [name], during previous admission who has recommended her blood pressure to run in the high normal range. Her Coreg has been stopped and her Norvasc dose has been decreased prior  to this admission. The patient seemed to have elevated troponins that were related to demand ischemia. As far as problems: goes: 1.  Syncope after hypotension was likely due to the dialysis removing fluid, the patient being significantly hypotensive, having significant symptoms that were related to her prior stroke with worsening. Since the patient had a CVA in 03/2012, she was found to have significant narrowing of multiple arteries on the MRA so for that reason [name] has recommended her blood pressure to run normal high. At this moment on her state of end-stage renal disease, normal  high for her could be somewhere around 140 to 180. At this moment, I am not concerned about her blood pressures since they have been around 150s, 160s, occasionally in the 180s right now. I am going to let her have her Norvasc at 2.5 mg a day. Her Norvasc was increased here to 5 mg but I think that for her safety, it is better to decrease it back to [name]'s recommendations. Her syncopal episode happened only once, did not repeat any of the symptoms, and her right-sided weakness actually improved after her hypotension was resolved.  2.  Right forearm pain. The patient is status post recent graft placement. She received about two doses of vancomycin after dialysis. At this moment I do not think there is any need for continuation of antibiotics. Her white count is normal. She has not had any fevers, but she does have significant swelling of the left forearm. I discussed this with [name], who was very nice and came to check on the patient. He states that this is the common swelling that she could have with the fistula and that she will be okay to be discharged and follow up in the office.  At this moment I have discussed that with the family and family is okay taking her home and monitor from there.  3.  As far as her elevated troponin that were due to hypotension and demand ischemia. She is on aspirin and Plavix, and we are going to continue that. She is not on a beta blocker due to her hypotension.  4.  Her diabetes has been controlled with her Lantus. 5.  Her vascular dementia has not had any significant worsening. She has not been agitated and as far as her end-stage renal disease, she needs to continue to follow up with [name] on the regular basis. Continue dialysis on Monday , Wednesday, and Friday.   TIME SPENT:  I spent about 45 minutes with this discharge today.    ____________________________ Charlene Furnaceoberto Sanchez Gutierrez, MD rsg:jm D: 05/24/2012 09:40:30 ET T: 05/24/2012 10:09:15  ET JOB#: [id number]  cc: Charlene Furnaceoberto Sanchez Gutierrez, MD, <Dictator> Charlene Lizabeth LeydenN. Lateef, MD Renford DillsGregory G. Schnier, MD Nira ConnAriana Pancaldo, MD Pearletha FurlOBERTO SANCHEZ GUTIERRE MD ELECTRONICALLY SIGNED 06/04/2012 13:11

## 2014-07-10 NOTE — H&P (Signed)
DATE OF BIRTH:  11-08-1945  PRIMARY CARE PHYSICIAN:  Dr. Lahoma Rocker.  REFERRING PHYSICIAN:  Dr. Carollee Massed.  CHIEF COMPLAINT:  Decreased mental status for several days.   HISTORY OF PRESENT ILLNESS:  A 69 year old African American female with a history of hypertension, diabetes, ESRD on dialysis, hyperlipidemia and CVA, who presented to the ED with decreased mental status. The patient is awake, but not oriented. Could not provide any information. According to the patient's daughter, the patient has had a decreased mental status and ataxia for several days on and off. The patient also has right leg weakness. The patient denies any other symptoms. The CAT scan of head showed acute or subacute CVA. The patient had a CVA in 2011.   PAST MEDICAL HISTORY:  Hypertension; diabetes; ESRD on dialysis on Monday, Wednesday, Friday; hyperlipidemia; CVA.   FAMILY HISTORY:  Diabetes, hypertension.   SOCIAL HISTORY:  The patient has no smoking or drinking or illicit drugs. Living with her daughter. She came from [place] last November.  REVIEW OF SYSTEMS:  CONSTITUTIONAL:  The patient denies any fever or chills. No headache or dizziness. No double or blurred vision.  ENT:  No epistaxis, slurred speech or dysphagia.  CARDIOVASCULAR:  No chest pain, palpitation, orthopnea or nocturnal dyspnea. No leg edema.  PULMONARY:  No cough, sputum, shortness of breath or hemoptysis.  GASTROINTESTINAL:  No abdominal pain, nausea, vomiting or diarrhea. No melena or bloody stool.  GENITOURINARY:  No dysuria, hematuria or incontinence.  SKIN:  No rash or jaundice.  HEMATOLOGY:  No easy bruising or bleeding.  NEUROLOGY:  No syncope, loss of consciousness or seizure, but has decreased mental status and ataxia.   ALLERGIES:  None.  HOME MEDICATIONS:  Zantac 150 p.o. tablet once a day, Plavix 75 mg p.o. daily, multivitamin daily, Lantus 10 units subcu at bedtime, glipizide 5 mg p.o. daily, Coreg 6.25 mg p.o. daily, Colace 100  mg p.o. b.i.d., atorvastatin 80 mg p.o. at bedtime, aspirin 81 mg p.o. daily, Norvasc 10 mg p.o. daily,   PHYSICAL EXAMINATION: VITAL SIGNS:  Temperature 97.7, blood pressure 148/62, pulse 62, O2 saturation 100% on room air.  GENERAL:  The patient is alert, awake, oriented, in no acute distress.  HEENT:  Pupils round, equal, reactive to light and accommodation. Moist oral mucosa. Clear oropharynx.  NECK:  Supple. No JVD or carotid bruits. No lymphadenopathy. No thyromegaly.  CARDIOVASCULAR:  S1, S2, regular rate and rhythm. No murmurs or gallops.  PULMONARY:  Bilateral air entry. No wheezing or rales. No use of accessory muscles to breathe.  ABDOMEN:  Soft. No distention or tenderness. No organomegaly. Bowel sounds present.  EXTREMITIES:  No edema, clubbing or cyanosis. No calf tenderness. Strong bilateral pedal pulses.  NEUROLOGY:  The patient is awake, but confused. Follows commands. No focal deficit. Power 5/5. Sensation intact.   LABORATORY DATA:  Urinalysis showed WBC 16, RBC 2. Glucose 129, BUN 67, creatinine 9.04, sodium 134, potassium 4.6, chloride 95, bicarb of 25. CBC showed WBC 5.3, hemoglobin 10.9 and platelets 242. CK 128, CK-MB 4.2. EKG showed normal sinus rhythm at 62 BPM. CAT scan of head showed left thalamic subacute infarct.  IMPRESSIONS:  1.  Left side acute or subacute cerebrovascular accident.  2.  Urinary tract infection.  3.  Anemia.  4.  Hypertension.  5.  Diabetes.  6.  End-stage renal disease on dialysis.  7.  Hyperlipidemia.   PLAN OF TREATMENT:   1.  The patient will be admitted to the telemetry  floor. We will increase the aspirin to 325 mg p.o. daily, continue Plavix and atorvastatin. We will get an MRI of the brain, echocardiograph in the carotid duplex. 2.  We will request a PT and swallowing study.  3.  For dialysis, we will get a nephrology consult for the arrangement of dialysis. 4.  I discussed the patient's situation and plan of treatment with the  patient's daughter.   TIME SPENT:  About 66 minutes.   ____________________________ Shaune PollackQing Tamyra Fojtik, MD qc:ms D: 03/24/2012 19:48:22 ET T: 03/24/2012 20:16:59 ET JOB#: [id number]  cc: Shaune PollackQing Anastazia Creek, MD, <Dictator> Shaune PollackQING Raihan Kimmel MD ELECTRONICALLY SIGNED 03/25/2012 15:56

## 2014-07-10 NOTE — Op Note (Signed)
PATIENT NAMAlyson Ingles:  [name], [name] MR#:  [id number] DATE OF BIRTH:  06-14-1945  DATE OF PROCEDURE:  11/05/2012  PREOPERATIVE DIAGNOSES: 1.  Complication of dialysis device with nonfunction of catheter.  2.  End-stage renal disease requiring dialysis.   POSTOPERATIVE DIAGNOSES:  1.  Complication of dialysis device with nonfunction of catheter.  2.  End-stage renal disease requiring dialysis.   PROCEDURE PERFORMED: Removal of right IJ cuffed tunneled dialysis catheter.   SURGEON: Renford DillsGregory G. Corrie Brannen, M.D.   DESCRIPTION OF PROCEDURE: The patient is in the preoperative holding area. She is positioned supine. The right neck and chest wall and catheter are prepped and draped in sterile fashion. Sutures are removed. Cuff is localized by palpation and 1% lidocaine is infiltrated in the soft tissues. A small incision is created overlying the cuff and the dissection is carried down to expose the catheter, which is grasped with a hemostat and surrounding tissue attachments to the cuff are dissected sharply with an 11 blade scalpel and scissors. Catheter is then removed, light pressure is held at the base of the neck and the incision is closed with 4-0 Monocryl subcuticular.   ____________________________ Renford DillsGregory G. Raylan Hanton, MD ggs:cc D: 11/05/2012 14:16:26 ET T: 11/05/2012 17:10:48 ET JOB#: [id number]  cc: Renford DillsGregory G. Trek Kimball, MD, <Dictator> Renford DillsGREGORY G Anjelica Gorniak MD ELECTRONICALLY SIGNED 11/29/2012 9:10

## 2014-07-10 NOTE — Consult Note (Signed)
PATIENT NAME:  [name], [name] MR#:  [id number] DATE OF BIRTH:  March 25, 1945  DATE OF CONSULTATION:  03/25/2012  REFERRING PHYSICIAN:  Srikar R. Sudini, MD CONSULTING PHYSICIAN:  Hemang K. Sherryll BurgerShah, MD PRIMARY CARE PHYSICIAN: Nira ConnAriana Pancaldo, MD    REASON FOR CONSULTATION: Concern for multiple strokes,   HISTORY OF PRESENT ILLNESS: The patient is a 69 year old African American female when who had her first stroke back in November 2011 when she came down from [place] her family and was found to have some weakness on the left side of her hemibody. She started on aspirin initially before the stroke, so she was switched over to the Plavix.  Interim, the patient had a left occipitoparietal and frontal stroke which were not documented clinically. This is based on her MRI.   The patient was evaluated in the dialysis unit. No family member was available to interview, so the history was mostly taken from the chart. The patient cannot tell me how long she is here in the hospital or why she came to the hospital, but per the chart the patient was brought because of her "altered mental status."   PAST MEDICAL HISTORY: Significant for hypertension, diabetes, end-stage renal disease on hemodialysis on Monday, Wednesday, Friday through the Port-A-Cath. She has hyperlipidemia, previous history of stroke, anemia of chronic disease.   PAST SURGICAL HISTORY: Significant for port placement.   FAMILY HISTORY: Significant for diabetes and hypertension.   SOCIAL HISTORY: Significant that she currently does not smoke or drink alcohol. She was living with her daughter. She came from [place].   REVIEW OF SYSTEMS: Difficult to obtain due to her altered mental status, but she denied any pain or weakness. She is confused. She is not sure why she is here in the hospital.   MEDICATIONS AND ALLERGIES: I reviewed her allergy and home medication list and hospital medication list.   PHYSICAL  EXAMINATION: VITAL SIGNS: Her temperature is 98, pulse 70, pulse ox 100%, respiratory rate 17, blood pressure 140/80.  GENERAL: She is an elderly-looking African American female lying in the hospital bed, not in acute distress. The patient has dialysis going on through her right Port-A-Cath.  LUNGS: It was difficult to do her lung exam.  HEART: S1, S2 heart sounds.  NECK: Carotid exam did not reveal any bruit.   MENTAL STATUS EXAM: She was alert. She was not oriented. She could not tell me the current date, day, month. She could not tell me the President's name. She could not tell me any President's name. Her immediate recall was 1 out of 3 and delayed recall was 0 out of 3. The patient was not able to follow 2-step commands but was able to follow 1-step commands. The patient's attention and concentration were reduced. I did not see any neurological neglect. She does have some language impairment. Her voice was hypophonic.   On her cranial nerves, her pupils are equal, round, and reactive. Extraocular movements were slow. Her face was symmetric. Tongue was midline. Facial sensations were intact. Her hearing was intact. Her uvula was upgoing.   On her motor exam, she has mildly increased tone in her bilateral upper and lower extremities. She picked up both her arms and legs without any problems. She had some problem with her dexterity but otherwise it seemed to be nonfocal.   Her sensations were intact to light touch. Her deep tendon reflexes were symmetric.   RADIOLOGICAL DATA: On review of her radiological data,  the patient has extensive white matter microvascular ischemic changes on her MRI.   I reviewed her MRI from November 2011, which showed right putaminal infarct extending to the body of the caudate nucleus which is suggestive of lenticulostriate branch of right MCA (small vessel disease).   The patient's new MRI from this admission, she has encephalomalacia on her FLAIR sequence in her  left occipital, left parietal, and left frontal region which seems to be distinct, 3 different infarcts, but that seems to be old.   She has an area of smudgy hyperintensity in her DWI with corresponding ADC suggestive of a new infarct (ischemic nonhemorrhagic infarct and stroke in her left occipital and left frontal region which seems to be extension of her old infarct rather than new territory infarct).  multiple mid  vessel disease or small vessel disease   infarct rather than embolic phenomenon.   ASSESSMENT AND PLAN:  Problem 1.  Multiple strokes: The patient's first stroke was in November 2011. Since then, she had at least 3 strokes before her admission. Her admission seems to be coming from her subacute infarct which is in her left occipital and left frontal region. Even though it is different territories, I think it is still related to the medium vessel disease and it might be unrecognized hypertensive event caused her to have this presentation.   The patient should have an MRA of the brain done to look for MCA and PCA focal stenosis. Still, this will not change the management as SAMMPRIS trial has recommended not to pursue a stent for severe intracranial disease, and aggressive medical management is still superior.   The patient was on Plavix. I am okay with adding aspirin. If the patient can tolerate, can be switched over to Aggrenox. The patient is on a statin. We should avoid sudden reduction in her blood pressure to avoid expanding her penumbra. The patient should have a flu shot. She should take multivitamins.   The patient should avoid any hyperthermia, which is known to worsen stroke outcome in long-term.   The patient should have deep vein thrombosis prophylaxis and a swallow evaluation.   Problem 2.  End-stage renal disease on hemodialysis, per Nephrology.   I talked to the patient briefly about the importance of risk factor control, but her ability to comprehend is very limited. I  have reviewed her other stroke workup. Her echo  showed moderate LVH, and carotid ultrasound did not show any hemodynamically significant stenosis.   I will see this patient on a less frequent basis. Feel free to contact me with any further questions.  ____________________________ Durene Cal. Sherryll Burger, MD hks:cb D: 03/25/2012 15:17:57 ET T: 03/25/2012 19:58:26 ET JOB#: [id number] cc: Hemang K. Sherryll Burger, MD, <Dictator> [facility] Cal [facility] MD ELECTRONICALLY SIGNED 03/29/2012 6:52

## 2014-07-10 NOTE — Discharge Summary (Signed)
PATIENT NAME:  [name], [name] MR#:  [id number] DATE OF BIRTH:  08-21-1945  DISCHARGE SUMMARY ADDENDUM  DATE OF ADMISSION:  05/20/2012 DATE OF DISCHARGE:  05/26/2012  The patient needed to stay a little bit longer over here because of power issues at home. At this moment, the temperature has risen, the skies are clear, and the family agreed to take her home today for what we are going to discharge her. There are not any other acute issues during this hospitalization to add on to my previous discharge summary. I spent about 25 minutes with this discharge today.     ____________________________ Felipa Furnaceoberto Sanchez Gutierrez, MD rsg:dm D: 05/26/2012 11:20:00 ET T: 05/26/2012 12:39:37 ET JOB#: [id number]  cc: Felipa Furnaceoberto Sanchez Gutierrez, MD, <Dictator> ROBERTO Juanda ChanceSANCHEZ GUTIERRE MD ELECTRONICALLY SIGNED 06/04/2012 13:11

## 2014-07-10 NOTE — Discharge Summary (Signed)
PATIENT NAMAlyson Ingles:  Roberts, [name] MR#:  [id number] DATE OF BIRTH:  07-17-1945  DATE OF ADMISSION:  03/24/2012 DATE OF DISCHARGE:  03/26/2012  PRIMARY CARE PHYSICIAN: Charlene ConnAriana Pancaldo, MD   DISCHARGE DIAGNOSES:  1.  Acute left frontal and occipital cerebrovascular accident.  2.  Hypertension.  3.  End-stage renal disease.  4.  Hypertension.  5.  Hyperlipidemia.   CONSULTATION:  Charlene K. Sherryll BurgerShah, MD, Neurology.   IMAGING STUDIES:  1.  MRI of the brain showed acute left frontal and occipital CVA.  2.  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  3.  2-Echocardiogram showed ___________ 55% with no embolic source.   ADMITTING HISTORY AND PHYSICAL: Please see detailed history and physical dictated on 03/24/2012. In brief, this is a 69 year old African American female patient with history of hypertension, diabetes, end-stage renal disease who presented to the Emergency Room with complaints of decreased mental status over the past few days. History was obtained mainly from the patient's daughter.  She also had some ataxia and right-sided weakness. The patient was admitted to the hospitalist service after her CT scan of the head showed acute to subacute cerebrovascular accident.   HOSPITAL COURSE:  1.  Acute frontal occipital stroke, left side. The patient had an MRI of the brain, which confirmed the acute stroke. 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 The case was seen by Dr. Sherryll Roberts and on further discussion he suggested maintaining her blood pressure at high normal ranges, as hypotension could have caused the stroke. Her aspirin, Plavix and statin are being continued at the time of discharge. She is being set up with home health physical therapy. Followup with Dr. Sherryll Roberts of Neurology in 1  to 2 weeks.  2.  Hypertension. The patient does have labile hypertension and with a drop in her blood pressure with dialysis, which could be exacerbating her symptoms of stroke. The patient's Coreg has been stopped.  Her Norvasc is being decreased to 2.5 from 10 mg at the time of discharge. Her blood pressure needs to be maintained around 140 to 150 systolic and not less.   The patient also had some tremors briefly in her right upper extremity. She has had onset from the time of her stroke, which was thought to be the possible cause. The patient will followup with Neurology.   DISCHARGE MEDICATIONS:  1.  Lantus 10 units subcutaneous once a day at bedtime.  2.  Colace 100 mg 2 capsules oral once a day at bedtime.  3.  Zantac 150 mg oral once a day.  4.  Plavix 75 mg oral once a day.  5.  Aspirin 81 mg oral once a day. 6.  Simvastatin 40 mg oral once a day.  7.  Zetia 10 mg oral once a day. 8.  Renal tabs 1 tablet oral once a day.  9.  Fish oil 1000 mg oral 2 times a day.  10.  Melatonin 5 mg oral once a day.  11.  Vitamin D3 50,000 international units once a month.  12.  Norvasc 2.5 mg oral once a day.  13.  Ativan 0.5 mg oral once a day at bedtime, as needed for anxiety.   DISCHARGE INSTRUCTIONS:  1.  Followup with Dr. Sherryll Roberts of Neurology in 1 to 2 weeks.  2.  Follow up with primary care physician in 1 to 2 weeks.             3.  The patient will work with physical therapy at home with Home Health, which has been set up. I have advised her daughter and the patient to watch out for any further deterioration as the patient is a high risk for further strokes, which I have explained to them and they have verbalized understanding.   Time spent on day of discharge and discharge activity was 50 minutes   ____________________________ [name]. Charlene Dorko, MD srs:eg D: 03/26/2012 15:04:02 ET T: 03/26/2012 21:35:58 ET JOB#: [id number]  cc: Charlene Heath R. Meribeth Vitug, MD, <Dictator> Charlene Conn,  MD Charlene K. Sherryll Burger, MD Charlene Fisherman MD ELECTRONICALLY SIGNED 03/28/2012 15:26

## 2014-07-10 NOTE — Op Note (Signed)
PATIENT NAME:  [name], [name] MR#:  [id number] DATE OF BIRTH:  1946/01/19  DATE OF PROCEDURE:  05/17/2012  PREOPERATIVE DIAGNOSIS:  End-stage renal disease requiring hemodialysis.   POSTOPERATIVE DIAGNOSIS:  End-stage renal disease requiring hemodialysis.   PROCEDURE PERFORMED:  Creation of a left forearm arteriovenous loop graft with 4 to 7 mm tapered PTFE Propaten graft.   SURGEON:  Renford DillsGregory G Schnier, MD.   ANESTHESIA:  General by LMA.   FLUIDS:  Per anesthesia record.   ESTIMATED BLOOD LOSS:  Minimal.   SPECIMEN:  None.   INDICATIONS:  The patient is a 69 year old woman who presents for creation of permanent dialysis access. The risks and benefits are reviewed. All questions answered. The patient agrees to proceed.   DESCRIPTION OF PROCEDURE:  The patient is taken to the Operating Room and placed in the supine position. After adequate general anesthesia is induced and appropriate invasive monitors are placed, she is positioned supine with her left arm extended palm upward. The left arm is prepped and draped in a sterile fashion. Next 0.25% Marcaine is infiltrated in the soft tissue progress approximately 1 fingerbreadth below the antecubital crease, and a transverse incision is created, and dissection is carried down through the soft tissues to expose a 5 to 6 mm vein. All tributaries are then looped with Silastic vessel loops. The dissection is then brought more medially, and the brachial artery is identified. It is then looped proximally and distally.   A counterincision is then created more distally on the forearm. A Gore tunneling device is used to pull a 4 to 7 mm tapered Propaten graft subcutaneously.   The brachial artery is then controlled with Silastic vessel loops. Arteriotomy is made, extended with Potts scissors, the graft is beveled, and an end graft-to-side brachial artery anastomosis is fashioned with running CV-6 suture. Flushing maneuvers are performed, and a graft is  pressurized. It is noted to have a smooth contour and be readily palpable for easy access. The graft is then irrigated with heparinized saline and clamped just above the suture line. With the graft approximated to the vein while the vein is in its native bed, the graft is marked. The vein is then opened using an 11 blade and the Potts scissors. Stay sutures of 6-0 Prolene are placed. The graft is beveled, and an end graft-to-side vein anastomosis is fashioned using running CV-6 suture. Flushing maneuvers are performed and flow is established through the graft. A soft thrill is noted with minimal pulsitility suggesting a low resistance outflow. The wounds are then irrigated and then closed in layers using running 3-0 Vicryl followed by 4-0 Monocryl subcuticular and Dermabond. The patient tolerated the procedure well. There were no immediate complications. Sponge and needle counts are correct, and she is taken to the recovery area in excellent condition.   ____________________________ Renford DillsGregory G. Schnier, MD ggs:jm D: 05/17/2012 18:13:43 ET T: 05/18/2012 11:50:03 ET JOB#: [id number]  cc: Renford DillsGregory G. Schnier, MD, <Dictator> Molli Barrowsaven Voora, MD Renford DillsGREGORY G SCHNIER MD ELECTRONICALLY SIGNED 06/18/2012 17:18

## 2014-07-10 NOTE — H&P (Signed)
PATIENT NAME:  [name], [name] MR#:  [id number] DATE OF BIRTH:  1946-01-02  DATE OF ADMISSION:  05/20/2012  PRIMARY CARE PHYSICIAN:  Nira ConnAriana Pancaldo, MD  PRIMARY NEUROLOGIST:  Dr. Cristopher PeruHemang Shah.   CHIEF COMPLAINT:  Syncope with hypotension.   HISTORY OF PRESENTING ILLNESS:  The patient is a 69 year old African American female patient with history of hypertension, diabetes, recent acute CVA of frontal lobe with moderate to high grade diffuse narrowing of the arteries, end-stage renal disease presents to the Emergency Room with episode of syncope after dialysis with blood pressure of 88/41. The patient had 2.3 liters removed today. Had 250 bolus of normal saline and the patient woke up and was back to normal. The patient is presently back to her baseline with daughter at bedside.   The patient was noted to have some right-sided weakness per EMS which has resolved. She has had on and off cough for about 7 days now without any fever or chills.   The patient had an AV graft placed in the left upper extremity 3 days prior by Dr. Wyn Quakerew. The patient had an episode of cyanosis and pain yesterday. The daughter called Dr. Wyn Quakerew who supposedly suggested to the daughter it was normal and asked her to elevate the hand. Presently, these symptoms have resolved.   The patient was seen by Dr. Sherryll BurgerShah of neurology during her recent admission for this stroke and recommended her blood pressures to run in the high normal range. She saw Dr. Sherryll BurgerShah as an outpatient 2 weeks prior. Her Coreg was stopped and Norvasc dose decreased.   The patient's troponin was checked today in the Emergency Room and was found to be elevated at 0.49 with normal CK and MB and the patient does not complain of any shortness of breath or chest pain. An EKG does not show any acute changes.   PAST MEDICAL HISTORY:   1.  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  2.  Diabetes mellitus, type 2.  3.  End-stage renal disease on hemodialysis.  4.   Hypertension.  5.  Hyperlipidemia.  6.  Dementia, vascular.   PAST SURGICAL HISTORY:  None.   ALLERGIES:  None.   FAMILY HISTORY:  Diabetes mellitus in multiple family members. The patient's uncle had liver cirrhosis, alcohol induced.   SOCIAL HISTORY:  The patient is widowed for 4 years. [place], [place]. No smoking. No alcohol.   REVIEW OF SYSTEMS:  Unobtainable as patient is confused. Denies any concerns on reviewing 12-point review of systems. The patient does have dementia.   HOME MEDICATIONS:  Aspirin 81 mg once a day, Colace 100 mg 2 times a day, fish oil 1000 mg 2 times a day, Lantus 10 units subcutaneous once a day, melatonin 5 mg oral once a day, Norco 5/325 mg 1 tablet every 6 hours, Norvasc 2.5 mg oral once a day, Plavix 75 mg oral once a day, Renvela 800 mg 2 tablets oral 3 times a day, vitamin B12 oral once a day, vitamin D3 at 50,000 International Units once a day, Zantac 150 mg oral once a day, Zetia 10 mg oral once a day, Zocor 5 mg oral once a day.   PHYSICAL EXAMINATION: VITAL SIGNS: Temperature 98.2, pulse 74, respirations 20, blood pressure 161/67 and saturating 97% on room air.  GENERAL: Moderately built [place] female patient sitting up in bed, comfortable, conversational, cooperative with  exam.  PSYCHIATRIC: Alert and awake. Orientation unable to assess as the patient does not respond to questions well.  HEENT: Atraumatic, normocephalic. Oral mucosa dry and pink. External ears and nose normal. No pallor. No icterus. Pupils bilaterally equal and reactive to light.  NECK: Supple. No thyromegaly. No palpable lymph nodes. Trachea midline. No carotid bruits or JVD.  CARDIOVASCULAR: S1, S2. No edema. Peripheral pulses 2+. Regular rate and rhythm.  RESPIRATORY: Has some rhonchi bilaterally with crackles on the right side.  GASTROINTESTINAL: Soft abdomen, nontender. Bowel  sounds present. No hepatosplenomegaly palpable.  SKIN: Warm and dry. No petechiae, rash, ulcers.  MUSCULOSKELETAL: No joint swelling, redness, effusion of the large joints. Normal muscle tone.  EXTREMITIES: Has left upper extremity AV graft with no thrill found.  NEUROLOGICAL: Motor strength 4+ in upper and lower extremities. Sensation to fine touch intact all over. Cranial nerves II through XII intact.  LYMPHATIC: No cervical lymphadenopathy.   DIAGNOSTIC DATA:  Glucose 95, BUN 21, creatinine 3.47, sodium 135, potassium 3.3. Troponin is 0.49, MB 2.2 with CK 70. WBC is 4.4 and hemoglobin 12.5. Urinalysis shows 2+ bacteria, but only 1 WBC contaminated. EKG shows normal sinus rhythm with no acute ST-T wave changes. Chest x-ray shows chronic changes. No acute abnormalities.   ASSESSMENT AND PLAN:   1.  Syncope secondary to hypotension after dialysis. The patient needs to have less amount of fluid removed with dialysis to maintain blood pressure up considering her symptoms of right-sided weakness with hypotension. The patient has been recommended high normal blood pressure by neurologist, Dr. Sherryll Burger. We will consult nephrology for her end-stage renal disease and dialysis needs.  2.  Right-sided weakness secondary to the hypotension. The patient did have significant narrowing of multiple blood vessels. Presently symptoms have resolved. We will try to maintain the patient's blood pressure between 140 to 150. Continue her Norvasc.  3.  End-stage renal disease with vascular graft. We will consult Dr. Gilda Crease with the patient's complaint of cyanosis in her left upper extremity. Presently, the symptoms have resolved, but there is a feeble pulse in that arm. We will await further input from vascular.  4.  Bronchitis. The patient has had some cough and some crackles on the left side but no pulmonary edema. We will start her on azithromycin along with p.r.n. breathing treatments and some guaifenesin.  5.  Elevated  troponin of 0.49. This is likely secondary from the hypotension and demand ischemia in the setting of end-stage renal disease. The patient did have recent echocardiogram during this stroke. We will get a repeat echo to look for any wall motion abnormalities. If her troponin is trending down with 2 more sets of cardiac enzymes and no new findings on the echo, the patient will not need any further workup and only outpatient followup.  6.  Deep vein thrombosis prophylaxis with heparin.   CODE STATUS:  Full code.   TIME SPENT TODAY ON THIS CASE:  45 minutes.    ____________________________ Molinda Bailiff Sudini, MD srs:si D: 05/20/2012 18:13:00 ET T: 05/20/2012 20:54:47 ET JOB#: [id number]  cc: Wardell Heath R. Sudini, MD, <Dictator> Hemang K. Sherryll Burger, MD Nira Conn, MD  Orie Fisherman MD ELECTRONICALLY SIGNED 05/21/2012 14:06

## 2014-07-10 NOTE — Consult Note (Signed)
Brief Consult Note: Diagnosis: pain at surgery site s/p loop forearm av graft.   Recommend further assessment or treatment.   Comments: Patient notes pain at the surgery site and there is some errythema and edema.  This does not appear aout of the norm for this surgery but there is prosthetic involved.  Therefore I would continue Vanc for about 7 days.  She specifically denies hand pain and the graft has a good bruit.  She will continue the follow up in the office with me as arranged.  Electronic Signatures: Levora DredgeSchnier, Gregory (MD)  (Signed 04-Mar-14 19:17)  Authored: Brief Consult Note   Last Updated: 04-Mar-14 19:17 by Levora DredgeSchnier, Gregory (MD)

## 2014-07-11 NOTE — Discharge Summary (Signed)
PATIENT NAME:  [name], [name] MR#:  [id number] DATE OF BIRTH:  Sep 17, 1945  DATE OF ADMISSION:  06/29/2013 DATE OF DISCHARGE:  07/01/2013  For a detailed note, please take a look at the history and physical done on admission on April 12th.   DIAGNOSES AT DISCHARGE: Diarrhea, likely viral in nature. End-stage renal disease, on hemodialysis Monday, Wednesday, Friday. Diabetes, accelerated hypertension, secondary hyperparathyroidism, dementia, hyperlipidemia.   DISCHARGE DIET:  The patient is being discharged on a  low-sodium, low-fat, American Diabetic Association diet.   ACTIVITY: As tolerated.   FOLLOWUP: With Dr. Nira ConnAriana Pancaldo in the next 1 to 2 weeks.   DISCHARGE MEDICATIONS:  Vitamin D3 50,000 International Units monthly, Renvela 800 mg t.i.d. with meals, simvastatin 40 mg at bedtime, Zantac 150 daily, Colace 100 mg b.i.d., Plavix 75 mg daily, Zetia 10 mg daily, Lantus 5 units at bedtime, amlodipine 5 mg b.i.d., hydralazine 25 mg t.i.d.    CONSULTANTS DURING HOSPITAL COURSE: Dr. Mosetta PigeonHarmeet Singh and Dr. Mady HaagensenMunsoor Lateef from nephrology.   PERTINENT STUDIES DONE DURING THE HOSPITAL COURSE: A CT scan of the head done without contrast done on April 12th showing no acute intracranial abnormalities, old left occipital infarct. Stool negative for C. diff. Stool comprehensive culture also essentially negative.   HOSPITAL COURSE: This is a 69 year old female with medical problems as mentioned above, presented to the hospital on June 29, 2013, due to generalized weakness and ongoing diarrhea.  1.  Diarrhea. The most likely cause of the patient's diarrhea is viral in nature. Initially on admission, it was suspected to be secondary to possible C. diff or infectious diarrhea although the patient had C. diff x 2 checked which were negative. Stool for comprehensive culture has been negative. She was treated supportively with IV fluids and antidiarrheals with Imodium and her diarrhea, she says, has improved  and resolved. She is now tolerating p.o. well with no nausea, vomiting and no worsening of her diarrhea.  2.  Generalized weakness. This was likely related to deconditioning along with ongoing diarrhea. The patient's diarrhea has been treated and now resolved. She was seen by physical therapy and they thought she would benefit from short-term rehab which is where she is going to be discharged to.  3.  End-stage renal disease, on hemodialysis. The patient gets dialysis on Monday, Wednesday, Friday. She was dialyzed here on her routine schedule and tolerated it well. She will continue that upon discharge.  4.  Secondary hyperparathyroidism. The patient was maintained on her Renvela. She will resume that.  5.  Diabetes. The patient was maintained on some sliding scale insulin but will resume her Lantus upon discharge.  6.  History of previous CVA. She will continue her Plavix and her statin.  7.  Hyperlipidemia. The patient was maintained on her Zetia and Zocor. She will resume that.  8.  Accelerated hypertension. The patient was started on her amlodipine and also hydralazine was added to her regimen. She will discharge on both of those medications for her blood pressure and further titration can be made outpatient.  9.  CODE STATUS: The patient is a full code.  10.  DISPOSITION: She is being discharged to a skilled nursing facility.   TIME SPENT: 40 minutes.   ____________________________ Rolly PancakeVivek J. Cherlynn KaiserSainani, MD vjs:cs D: 07/01/2013 15:38:00 ET T: 07/01/2013 15:50:05 ET JOB#: [id number]  cc: Rolly PancakeVivek J. Cherlynn KaiserSainani, MD, <Dictator> Nira ConnAriana Pancaldo, MD Houston SirenVIVEK J Matilyn Fehrman MD ELECTRONICALLY SIGNED 07/14/2013 10:38

## 2014-07-11 NOTE — H&P (Signed)
[name], [name] MR#:  [id number] DATE OF BIRTH:  [birth year]  DATE OF ADMISSION:  06/29/2013    PRIMARY CARE PHYSICIAN: Dr. Nira Roberts.  CHIEF COMPLAINT: Weakness and diarrhea.   HISTORY OF PRESENT ILLNESS: This is a 69 year old female with known history of end-stage lung disease on hemodialysis Monday, Wednesday, Friday, hypertension, diabetes, history of CVA, history of vascular dementia, who presents with complaints of diarrhea and weakness. The patient is a very poor historian due to her dementia. History was obtained from son-in-law. He reports the patient started to develop diarrhea today, as well as has been feeling weak for the last day, has decreased p.o. intake. He denies any nausea, any vomiting, any sick contact, anyone else sick in the home. The patient had significant diarrhea in the ED where it was sent for C. difficile, which was negative. The patient was afebrile, did not have any leukocytosis. As well blood pressure was uncontrolled upon presentation, but improved after receiving IV hydralazine. Given significant patient weakness and the need for IV hydration, hospitalists were requested to admit the patient. The family reports the patient is still making urine up to 3 times per day.   PAST MEDICAL HISTORY:  1. History of CVA.  2. Diabetes mellitus type 2. 3. End-stage lung disease on hemodialysis.  4. Hypertension.  5. Hyperlipidemia.  6. Vascular dementia.   PAST SURGICAL HISTORY: None.   ALLERGIES: None.  FAMILY HISTORY: Significant for diabetes mellitus.   SOCIAL HISTORY: The patient lives with her daughter and son-in-law. No alcohol. No smoking use.   REVIEW OF SYSTEMS: The patient has dementia, confused, cannot give a reliable review of systems.   HOME MEDICATIONS:  1. Lantus 5 units at bedtime.  2. Simvastatin 40 mg at bedtime.  3. Zetia 10 mg oral daily.  4. Plavix 70 mg oral daily.  5. Plavix 75 mg daily.  6. Norvasc 5 mg oral 2 times a  day.  7. Zantac 150 mg oral daily.  8. Colace 150 mg oral daily.  9. Vitamin D3 at 50,000 international units once a month.   PHYSICAL EXAMINATION:  VITAL SIGNS: Temperature 98.7, pulse 66, respiratory rate 16, blood pressure 164/68, saturating 96% on room air.  GENERAL: Well-nourished female who looks comfortable in bed, in no apparent distress.  HEENT: Head atraumatic, normocephalic. Pupils are equal and reactive to light. Pink conjunctivae. Anicteric sclerae. Dry oral mucosa.  NECK: Supple. No thyromegaly. No JVD.  CHEST: Good air entry bilaterally. No wheezing, rales, rhonchi.  CARDIOVASCULAR: S1, S2 heard. No rubs, murmurs, or gallops.  ABDOMEN: Soft, nontender, nondistended. Bowel sounds present.  EXTREMITIES: No edema. No clubbing. No cyanosis. Pedal and radial pulses felt bilaterally.  PSYCHIATRIC: The patient is pleasant, communicative, but appears to be confused, aware of her name. She knows in the hospital, but she cannot recall the hospital name. Impaired judgment and insight.  NEUROLOGIC: Cranial nerves appear to be grossly intact on physical examination. Moves all extremities without focal deficits.  SKIN: Normal skin turgor. Warm and dry and appears to be dehydrated.  MUSCULOSKELETAL: No joint effusion or erythema.  LYMPHATICS: No cervical lymphadenopathy could be appreciated.   PERTINENT LABORATORY DATA: Glucose 137, BUN 32, creatinine 5.54, sodium 133, potassium 3.1, chloride 95, CO2 of 32. ALT 27, AST 32, alkaline phosphatase 83. Troponin 0.02. White blood cells 2.8, hemoglobin 15.1, hematocrit 41.8, platelets 163,000. Urinalysis negative for leukocyte esterase and nitrite. C. difficile is negative.   ASSESSMENT AND PLAN:  1. Generalized weakness. This  is most likely related to patient's diarrhea. We will continue with hydration. Will consult physical therapy. She has no focal deficits. CT head did not show any acute findings.  2. Diarrhea. The patient is Clostridium  difficile negative. This is most likely viral gastroenteritis. Will follow on the stool workup. Continue with gentle IV fluid hydration. The patient will receive a total of 500 normal saline IV. We will give her another 500 over the next 10 hours and we will evaluate her clinically. If she does not have any volume overload. We will continue with further IV fluids and will encourage p.o. intake.  3. History of cerebrovascular accident. The patient has no new focal deficits. Continue with Plavix.  4. Diabetes mellitus. We will hold acting insulin. We will have her on insulin sliding scale.  5. End-stage renal disease. We will nephrology if patient needs hemodialysis during her hospital stay.  6. Hypertension: Initially uncontrolled, currently acceptable. Continue with home medication. Will add p.r.n. hydralazine.  7. Hyperlipidemia. Continue with Zetia and statin.  8. Vascular dementia. Continue with supportive care.  9. Deep vein thrombosis prophylaxis. Subcutaneous heparin.  CODE STATUS: Discussed with the family. The patient has a living will. Her daughter and son-in-law are her healthcare power of attorney, and she is a full code. Total   TIME SPENT ON ADMISSION AND PATIENT CARE: 45 minutes.   ____________________________ Starleen Armsawood S. Travis Mastel, MD dse:lt D: 06/29/2013 04:03:43 ET T: 06/29/2013 06:26:12 ET JOB#: [id number]  cc: Starleen Armsawood S. Dawid Dupriest, MD, <Dictator> Kamori Barbier Teena IraniS Ziah Leandro MD ELECTRONICALLY SIGNED 07/01/2013 2:52

## 2014-07-11 NOTE — Op Note (Signed)
PATIENT NAMEHANI, [name] MR#:  [id number] DATE OF BIRTH:  10/31/1945  DATE OF PROCEDURE:  08/29/2013  PREOPERATIVE DIAGNOSES: 1.  End-stage renal disease.  2.  Clotted left arm arteriovenous graft.  3.  Stroke.  4.  Diabetes.  5.  Hypertension.   POSTOPERATIVE DIAGNOSES: 1.  End-stage renal disease.  2.  Clotted left arm arteriovenous graft.  3.  Stroke.  4.  Diabetes.  5.  Hypertension.   PROCEDURES PERFORMED: 1.  Ultrasound guidance for vascular access in both an antegrade and retrograde fashion through the left arm AV graft.  2.  Left upper extremity shuntogram and central venogram.  3.  Catheter-directed thrombolysis with 6 mg of tPA to the entirety of the graft, the cephalic vein, the brachial artery and ulnar artery.  4.  Mechanical rheolytic thrombectomy to same vessels with the AngioJet AVX catheter.  5.  Percutaneous transluminal angioplasty of brachial artery and ulnar artery with a 4 mm diameter angioplasty balloon.  6.  Percutaneous transluminal angioplasty of arterial access site with 7 mm diameter angioplasty balloon.  7.  Percutaneous transluminal angioplasty of venous access site with 7 mm diameter angioplasty balloon.  8. Percutaneous transluminal angioplasty of venous anastomosis with 4 mm diameter angioplasty balloon.  9.  Viabahn-covered stent placement with a 6 mm diameter x 5 cm length covered stent for greater than 50% residual stenosis and extravasation after angioplasty.   SURGEON: Annice Needy, M.D.   ANESTHESIA:  Local with moderate conscious sedation.   ESTIMATED BLOOD LOSS:  100 mL.   CONTRAST USED:  45 mL of Visipaque.   INDICATION FOR PROCEDURE:  This is a 69 year old individual who was recommended to have intervention for her venous anastomotic stenosis almost a year ago and did not return for followup. She presents with a clotted graft. We are attempting to salvage this. Risks and benefits were discussed. Informed consent was obtained.    DESCRIPTION OF PROCEDURE:  The patient is brought to the vascular suite. The left upper extremity was sterilely prepped and draped and a sterile surgical field was created. We accessed the graft in both an antegrade and retrograde fashion crossing with ultrasound guidance and  permanent images were recorded. 6 French sheaths were placed and the patient was given 6000 units of intravenous heparin. Her initial imaging showed thrombus throughout the graft. No outflow and what appeared to be an occlusion at the venous anastomosis. There was actually thrombus in the brachial artery and into the radial and ulnar arteries on her initial imaging. At this point, 4 mg of tPA were delivered throughout the graft. I then used a Kumpe catheter and a Magic Torque wire to get the access to the brachial artery distal to the anastomosis and into the ulnar artery, and ran 2 more milligrams of tPA into the brachial artery and ulnar artery. This was allowed to dwell for 15 minutes. Mechanical rheolytic thrombectomy was then performed throughout the graft and through the brachial and ulnar artery similar to the thrombolysis. With the a stiff angled Glidewire and a Kumpe catheter, I was able to cross the occlusion of the venous anastomosis and confirm intraluminal flow and what appeared to be a cephalic vein for outflow. Her forearm loop graft appears to be plugged into the cephalic vein. The venous anastomosis was treated with a 4 mm diameter angioplasty balloon, and with this, there was seen extravasation at the venous anastomosis, as well as high-grade residual stenosis. The remainder of the outflow cephalic vein appeared  patent, as did the central venous circulation. There was still some thrombus and narrowing at the origin of the ulnar artery, and this was treated with a 4 mm diameter angioplasty balloon there with a good angiographic completion result. The arterial access site had thrombus and irregularity of the wall and was  treated with a 7 mm diameter high-pressure angioplasty balloon. Similar was seen at the venous access sites with residual thrombus and irregularity treated with a 7 mm diameter angioplasty balloon. At this point, the graft was pulsatile. There was flow within the graft. The flow was decent through the ulnar artery distal to the graft and through the brachial artery. The biggest issues remained at the venous anastomosis. I took out the retrograde sheath and placed a 4-0 Monocryl pursestring suture.  I exchanged for an 0.018 wire and a 6 mm diameter x 5 cm in length Viabahn stent was then deployed across the venous anastomosis encompassing the residual stenosis and covering the extravasation. This was post dilated with a 5 mm balloon with an excellent angiographic completion result and good clinical flow was seen through the graft. At this point, I elected to terminate the procedure. The other sheath was removed and a 4-0 Monocryl pursestring suture was placed. Pressure was held. Sterile dressing was placed. The patient tolerated the procedure well and was taken the recovery room in stable condition.   ____________________________ Annice NeedyJason S. Ariston Grandison, MD jsd:dmm D: 08/29/2013 17:00:52 ET T: 08/29/2013 22:07:48 ET JOB#: [id number]  cc: Annice NeedyJason S. Kamoni Gentles, MD, <Dictator> Annice NeedyJASON S Yousif Edelson MD ELECTRONICALLY SIGNED 09/04/2013 10:50

## 2014-07-16 ENCOUNTER — Other Ambulatory Visit: Payer: Self-pay | Admitting: *Deleted

## 2014-07-16 ENCOUNTER — Telehealth: Payer: Self-pay | Admitting: *Deleted

## 2014-07-16 MED ORDER — ISOSORBIDE DINITRATE 30 MG PO TABS: 30.0000 mg | ORAL_TABLET | Freq: Four times a day (QID) | ORAL | Status: DC

## 2014-07-16 MED ORDER — LISINOPRIL 20 MG PO TABS: 20.0000 mg | ORAL_TABLET | Freq: Every day | ORAL | Status: DC

## 2014-07-16 NOTE — Telephone Encounter (Signed)
. °  1. Which medications need to be refilled? Isosorbide   2. Which pharmacy is medication to [facility] sent [facility] [address]  3. Do they need a 30 day or 90 day supply? 90   4. Would they like a call back once the medication has been sent to the pharmacy? Yes

## 2014-07-17 ENCOUNTER — Other Ambulatory Visit: Payer: Self-pay | Admitting: *Deleted

## 2014-07-17 MED ORDER — METOPROLOL TARTRATE 25 MG PO TABS: 25.0000 mg | ORAL_TABLET | Freq: Two times a day (BID) | ORAL | Status: DC

## 2014-07-17 MED ORDER — HYDRALAZINE HCL 25 MG PO TABS: 25.0000 mg | ORAL_TABLET | Freq: Three times a day (TID) | ORAL | Status: DC

## 2014-07-17 NOTE — Telephone Encounter (Signed)
[facility] [place]

## 2014-07-19 NOTE — Consult Note (Signed)
General Aspect Primary Cardiologist: New to [facility] ________________  69 year old female with history of CAD s/p remote MI, multiple strokes, ESRD on HD (M,W,F), DM2, HTN, HLD, and dementia who presented to [facility] on 04/21/2014 with one day history of nausea, vomiting and diarrhea. In the ED she noted brief onset of chest pain. Troponin was checked and found to be 21. Cardiology was consulted for further evaluation.  ________________  PMH; 1. CAD s/p remote MI 2. Multiple strokes  3. ESRD on HD (M,W,F) 4. DM2 5. HTN 6. HLD 7. Dementia ________________   Present Illness 69 year old female with the above problem list who presented to [facility] on 04/21/2014 with the above complaint. She was noted to have a brief episode of chest pain while in the ER. She does not currently have a cardiologist. She has reported history of CAD s/p remote MI that was treated medically. No interventions. No ischemic evaluations since. She was fairly active up until 2013 when she suffered a stroke that slowed her down. She was fored to move down to [place] from [place] at that time time. She has suffered multiple strokes since that time continuing to slow her down. She is currently living with her daughter. She frequently has abdominal/N/V pain post HD, sometimes lasting for several hours.   Her daughter noted what she felt like was some fluid along the patient's vaginal folds so she brought her in for HD on 2/1. She tolerated the procedure well and felt great afterwards. She ate well. The following day the patient did not want to get out of bed. This was followed by N/V/D x1. She also noted some abdominal pain. No chest pain. She was brought into [facility] for further evaluation. Upon her arrival to [facility] she was noted to have a brief episode of chest pain while in the ED. Troponin was checked and found to be 21-->20-->21. EKG, NSR, 87 bpm, right axis deviation, possible LV strain 2/2 LVH, inferolateral TWI. History taken from patient's daughter.  She was treated with aspirin 81 and 4 SL NTG. She is currently chest pain and abdominal pain free.   Physical Exam:  GEN no acute distress   HEENT hearing intact to voice   NECK supple   RESP normal resp effort  clear BS   CARD Regular rate and rhythm  Normal, S1, S2  Murmur   Murmur Systolic  3/6 RUSB   ABD denies tenderness  soft   EXTR negative edema   SKIN normal to palpation   NEURO cranial nerves intact   PSYCH alert   Review of Systems:  General: Fatigue  Weakness   Skin: No Complaints   ENT: No Complaints   Eyes: No Complaints   Neck: No Complaints   Respiratory: No Complaints   Cardiovascular: Chest pain or discomfort   Gastrointestinal: Nausea  Diarrhea  abdominal pain   Genitourinary: No Complaints   Vascular: No Complaints   Musculoskeletal: No Complaints   Neurologic: No Complaints   Hematologic: No Complaints   Endocrine: No Complaints   Psychiatric: No Complaints   Review of Systems: All other systems were reviewed and found to be negative   Medications/Allergies Reviewed Medications/Allergies reviewed   Family & Social History:  Family and Social History:  Family History mother: was in good health until she passed; father: passed 2/2 stroke   Social History negative tobacco, negative ETOH, negative Illicit drugs   Place of Living Home  lives with daughter  cad:    dementia:    Dialysis:    CVA:    Diabetes:    HTN:   Home Medications: Medication Instructions Status  Vitamin D3 50,000 intl units oral capsule 1 cap(s) orally once a month Active  Zantac 150 mg oral tablet 1 tab(s) orally once a day Active  Plavix 75 mg oral tablet 1 tab(s) orally once a day Active  Zetia 10 mg oral tablet 1 tab(s) orally once a day Active  Colace sodium 100 mg oral capsule 1 cap(s) orally once a day Active  Norvasc 5 mg oral tablet 1 tab(s) orally 2 times a day Active  promethazine 25 mg oral tablet 1 tab(s) orally every 6  hours, As Needed - for Nausea, Vomiting Active  PhosLo 667 mg oral capsule 1 cap(s) orally 3 times a day with meals Active  atorvastatin 20 mg oral tablet 1 tab(s) orally once a day (at bedtime) Active   Lab Results:  Thyroid:  03-Feb-16 02:20   Thyroid Stimulating Hormone 1.08 (0.45-4.50 (IU = International Unit)  ----------------------- Pregnant patients have  different reference  ranges for TSH:  - - - - - - - - - -  Pregnant, first trimetser:  0.36 - 2.50 uIU/mL)  Routine Chem:  03-Feb-16 02:20   Result Comment TROPONIN - RESULTS VERIFIED BY REPEAT TESTING.  - PREV. C/ 04-21-14 _0  BY [email or web address].Marland KitchenAJO  Result(s) reported on 22 Apr 2014 at 03:27AM.  Cholesterol, Serum  256  Triglycerides, Serum 22  HDL (INHOUSE)  73  VLDL Cholesterol Calculated  4  LDL Cholesterol Calculated  179 (Result(s) reported on 22 Apr 2014 at 03:03AM.)  Glucose, Serum 91  BUN  50  Creatinine (comp)  6.23  Sodium, Serum  134  Potassium, Serum 4.1  Chloride, Serum  92  CO2, Serum 32  Calcium (Total), Serum 8.8  Anion Gap 10  Osmolality (calc) 281  eGFR (African American)  9  eGFR (Non-African American)  7 (eGFR values <43m/min/1.73 m2 may be an indication of chronic kidney disease (CKD). Calculated eGFR, using the MRDR Study equation, is useful in  patients with stable renal function. The eGFR calculation will not be reliable in acutely ill patients when serum creatinine is changing rapidly. It is not useful in patients on dialysis. The eGFR calculation may not be applicable to patients at the low and high extremes of body sizes, pregnant women, and vegetarians.)  Magnesium, Serum 2.1 (1.8-2.4 THERAPEUTIC RANGE: 4-7 mg/dL TOXIC: > 10 mg/dL  -----------------------)  Hemoglobin A1c ([facility]) 5.1 (The American Diabetes Association recommends that a primary goal of therapy should be <7% and that physicians should reevaluate the treatment regimen in patients with HbA1c values consistently >8%.)   Cardiac:  02-Feb-16 14:48   Troponin I  21.00 (0.00-0.05 0.05 ng/mL or less: NEGATIVE  Repeat testing in 3-6 hrs  if clinically indicated. >0.05 ng/mL: POTENTIAL  MYOCARDIAL INJURY. Repeat  testing in 3-6 hrs if  clinically indicated. NOTE: An increase or decrease  of 30% or more on serial  testing suggests a  clinically important change)  Troponin I - (0.00-0.05 0.05 ng/mL or less: NEGATIVE  Repeat testing in 3-6 hrs  if clinically indicated. >0.05 ng/mL: POTENTIAL  MYOCARDIAL INJURY. Repeat  testing in 3-6 hrs if  clinically indicated. NOTE: An increase or decrease  of 30% or more on serial  testing suggests a  clinically important change)  CPK-MB, Serum  7.5 (Result(s) reported on 21 Apr 2014 at 04:56PM.)    21:02  Troponin I  20.00 (0.00-0.05 0.05 ng/mL or less: NEGATIVE  Repeat testing in 3-6 hrs  if clinically indicated. >0.05 ng/mL: POTENTIAL  MYOCARDIAL INJURY. Repeat  testing in 3-6 hrs if  clinically indicated. NOTE: An increase or decrease  of 30% or more on serial  testing suggests a  clinically important change)  CPK-MB, Serum  5.2 (Result(s) reported on 21 Apr 2014 at 09:42PM.)  03-Feb-16 02:20   Troponin I  21.00 (0.00-0.05 0.05 ng/mL or less: NEGATIVE  Repeat testing in 3-6 hrs  if clinically indicated. >0.05 ng/mL: POTENTIAL  MYOCARDIAL INJURY. Repeat  testing in 3-6 hrs if  clinically indicated. NOTE: An increase or decrease  of 30% or more on serial  testing suggests a  clinically important change)  CPK-MB, Serum  4.3 (Result(s) reported on 22 Apr 2014 at 03:11AM.)  Routine Hem:  02-Feb-16 14:48   Hemoglobin (CBC)  11.0  Hemoglobin (CBC) -  Hematocrit (CBC)  33.0  Hematocrit (CBC) -  03-Feb-16 02:20   WBC (CBC) 3.9  RBC (CBC)  2.74  Hemoglobin (CBC)  8.1  Hematocrit (CBC)  24.5  Platelet Count (CBC)  98  MCV 89  MCH 29.5  MCHC 33.0  RDW  17.0  Neutrophil % 58.5  Lymphocyte % 24.0  Monocyte % 12.6  Eosinophil % 3.4   Basophil % 1.5  Neutrophil # 2.3  Lymphocyte #  0.9  Monocyte # 0.5  Eosinophil # 0.1  Basophil # 0.1 (Result(s) reported on 22 Apr 2014 at 03:27AM.)   EKG:  EKG Interp. by me   Interpretation NSR, 87 bpm, right axis deviation, possible anterior LV strain 2/2 LVH, inferolateral TWI   Radiology Results: CT:    02-Feb-16 15:47, CT Head Without Contrast  CT Head Without Contrast   REASON FOR EXAM:    weakness/dragging right foot  COMMENTS:       PROCEDURE: CT  - CT HEAD WITHOUT CONTRAST  - Apr 21 2014  3:47PM     CLINICAL DATA:  Pt here with c/o weakness for the past few days,  denies any pain, daughter states her primary has been changing her  blood pressure medicines for the past few weeks, daughter is  wondering if her weakness is from the med changes. Daughter also  concerned that pt has been dragging her right foot when she walks,  this is new for the pt. Bilateral grips weak but equal, speech is  weak but clear, answers questions appropriately.    EXAM:  CT HEAD WITHOUT CONTRAST  TECHNIQUE:  Contiguous axial images were obtained from the base of the skull  through the vertex without intravenous contrast.    COMPARISON:  06/29/2013    FINDINGS:  There is no evidence of mass effect, midline shift, or extra-axial  fluid collections. There is no evidence of a space-occupying lesion  or intracranial hemorrhage. There is no evidence of a cortical-based  area of acute infarction. There is an old left occipital lobe  infarct with encephalomalacia. There is a small old left posterior  parietal lobe infarct with encephalomalacia. There is generalized  cerebral atrophy. There is periventricular white matter low  attenuation likely secondary to microangiopathy.  The ventricles and sulci are appropriate for the patient's age. The  basal cisterns are patent.    Visualized portions of the orbits are unremarkable. The visualized  portions of the paranasal sinuses andmastoid  air cells are  unremarkable. Cerebrovascular atherosclerotic calcifications are  noted.    The osseous structures  are unremarkable.     IMPRESSION:  1. No acute intracranial pathology.  2. Chronic microvascular disease and cerebral atrophy.    Electronically Signed    By: Kathreen Devoid    On: 04/21/2014 15:53         Verified By: Jennette Banker, M.D., MD    Other -Explain in Comment Field: Other, Rash, Swelling  Vital Signs/Nurse's Notes: **Vital Signs.:   03-Feb-16 05:02  Vital Signs Type Routine  Temperature Temperature (F) 98.5  Celsius 36.9  Temperature Source oral  Pulse Pulse 79  Respirations Respirations 18  Systolic BP Systolic BP 009  Diastolic BP (mmHg) Diastolic BP (mmHg) 80  Mean BP 102  Pulse Ox % Pulse Ox % 100  Pulse Ox Activity Level  At rest  Oxygen Delivery Room Air/ 21 %    Impression 69 year old female with history of CAD s/p remote MI, multiple strokes, ESRD on HD (M,W,F), DM2, HTN, HLD, and dementia who presented to [facility] on 04/21/2014 with one day history of nausea, vomiting and diarrhea. In the ED she noted brief onset of chest pain. Troponin was checked and found to be 21. Cardiology was consulted for further evaluation.   1. Non ST elevation MI/CAD: -Given patient's baseline dementia and fraile state she is likely not a good candidate for invasive therapy at this time -Would continue heparin gtt for 48 hours total  -Already on Plavix 75 mg daily 2/2 history of strokes -Check echo to evaluate LV function and wall motion   -Lopressor 25 mg q 6 has been added (for uncontrolled HTN) -Continue Lipitor 40 mg, goal LDL <70  2. HTN: -Improving with the addition of Lopressor as above -Her Norvasc was also increased from 5 mg to 10 mg -Hydralazine is ordered for prn BP > SBP 170 -Continue to monitor  3. History of stroke: -Continue Plavix as above  4. ESRD on HD: -Volume management per Renal  5. DM2: -Per IM  6. Dementia:  7. HLD: -As  above  8. Thrombocytopenia: -PLT count trending down -Hold ASA currently   Electronic Signatures for Addendum Section:  Leonie Man (MD) (Signed Addendum 03-Feb-16 10:59)  I saw & examined the patient this AM along with Mr. Purcell Mouton.  I have reviewed the chart & available clinical data. Admitted for SSx of N/V then developed SSCP/Angina. Has ruled in for NSTEMI.  Initial plans have been based upon medical management due to co-morbidities (dementia, prior CVAs, frailty).  I spent ~15 min talking with her daughter (primary care-giver) who feels concerned about considering "invasive" evaluations.  The patient has clearly been declining since CVA.  She does not remeber going to HD.  I am in favor of continued [facility] management of NSTEMI with IV Heparin & continued Plavix.  Optimize BP control - agree with adding BB along wtih Hydralazine as ACE-I & CCB were esentially maxed out.  Check 2 D Echo for risk stratification -- depending on findings, slight chance that reconsidering an invasive strategy would be warranted, but unlikely.  Would have to be a high risk study.  For now, after discussion with the daughter, I do not forsee pursuing invasive evaluation. Will follow along.  Trent   Electronic Signatures: Rise Mu (PA-C)  (Signed 03-Feb-16 08:59)  Authored: General Aspect/Present Illness, History and Physical Exam, Review of System, Family & Social History, Past Medical History, Home Medications, Labs, EKG , Radiology, Allergies, Vital Signs/Nurse's Notes, Impression/Plan Leonie Man (MD)  (Signed  03-Feb-16 10:59)  Co-Signer: General Aspect/Present Illness, History and Physical Exam, Review of System, Family & Social History, Past Medical History, Home Medications, Labs, EKG , Radiology, Allergies, Vital Signs/Nurse's Notes, Impression/Plan   Last Updated: 03-Feb-16 10:59 by Leonie Man (MD)

## 2014-07-19 NOTE — Discharge Summary (Signed)
PATIENT NAME:  [name], [name] MR#:  [id number] DATE OF BIRTH:  Sep 20, 1945  DATE OF ADMISSION:  04/21/2014 DATE OF DISCHARGE:  04/24/2014  For a detailed note, please take a look at the history and physical done on admission by Dr. Imogene Burn.   DISCHARGE DIAGNOSES:  1.  Non-ST-elevation myocardial infarction. 2.  Endstage renal disease, on hemodialysis Monday, Wednesday and Friday. 3.  Hypertension. 4.  Hyperlipidemia. 5.  Secondary hyperparathyroidism. 6.  History of previous stroke.   DISCHARGE DIET: The patient is being discharged in a low-sodium, low-fat renal diet.   DISCHARGE ACTIVITY: As tolerated.  DISCHARGE FOLLOWUP: Follow up with Dr. Julien Nordmann in the next 1 to 2 weeks, also follow up with Dr. Venora Maples in the next 1 to 2 weeks.  DISCHARGE MEDICATIONS: Vitamin D3 50,000 international units monthly, Zantac 150 mg daily, Plavix 75 mg daily, Zetia 10 mg daily, promethazine 25 mg q. 6 hours as needed, PhosLo 667 mg 1 tab t.i.d. with meals, atorvastatin 20 mg at bedtime, Imdur 30 mg daily, lisinopril 20 mg daily, hydralazine 25 mg t.i.d., metoprolol tartrate 25 mg b.i.d., aspirin 81 mg daily.   CONSULTANTS DURING THE HOSPITAL COURSE: Dr. Lorine Bears and Dr. Mariah Milling from cardiology and Dr. Wynelle Link from nephrology.   PERTINENT STUDIES DURING HOSPITAL COURSE: CT scan of the head done without contrast on admission showed no acute intracranial pathology, chronic microvascular disease and cerebral atrophy.  A 2-dimensional echocardiogram showed an EF of 40% to 50%, mildly to moderately decreased global LV systolic function, basal and mid inferior wall is abnormal, moderately dilated left atrium, global pericardial effusion, moderately elevated pulmonary artery systolic pressure, mild global hypokinesis more prominent in the basal and mid inferior septal wall. This is consistent with RCA distribution.  HOSPITAL COURSE: This is a 69 year old female who presented to the hospital with  weakness and dragging her right foot and also noted to have an elevated troponin, as high as 21, consistent with a non-ST elevation MI.  1.  Non-ST elevation MI. This was an incidental finding on admission. The patient actually presented with weakness and difficulty walking. The patient acutely had no chest pain, no shortness of breath, no nausea or vomiting or any cardiac symptoms. The patient was started on a heparin drip, maintained on some aspirin and Plavix and started a beta blocker and also on a statin. A cardiology consult was obtained. They did consider doing a cardiac catheterization on the patient, although given her advanced dementia and her further comorbidities, the family opted for conservative management and no aggressive measures. The patient was therefore treated with heparin drip for 48 hours and has done well with it. She has had no chest pain. She is presently hemodynamically stable. She is being discharged on aspirin, Plavix, beta blocker, statin, ACE inhibitor, and Imdur. She will have close follow-up with a cardiologist as an outpatient. Echocardiogram did show some mild LV dysfunction and did show inferior apical hypokinesis.  2.  Hypertension. The patient remained hemodynamically stable. She will continue her metoprolol, hydralazine, lisinopril and Imdur as mentioned.  3.  Endstage renal disease, on hemodialysis. The patient was followed by nephrology. She did continue to get her dialysis on Monday, Wednesday and Friday. She will resume that.  4.  Secondary hyperparathyroidism. The patient was maintained on her Renvela. She will resume that.  5.  GERD. The patient will continue her ranitidine.  6.  Weakness and dragging her right foot. This was not secondary to any intracranial pathology. Her CT  head was negative. She was seen by physical therapy. They recommended short-term rehab. [facility] is being [facility].   CODE STATUS: The patient is a FULL code.    TIME SPENT ON DISCHARGE: 40 minutes.  DISCHARGE MEDICATIONS:  ____________________________ Rolly PancakeVivek J. Cherlynn KaiserSainani, MD vjs:sb D: 04/24/2014 14:30:21 ET T: 04/24/2014 14:45:20 ET JOB#: [id number]  cc: Rolly PancakeVivek J. Cherlynn KaiserSainani, MD, <Dictator> Antonieta Ibaimothy J. Gollan, MD Janeann ForehandJames H. Hawkins Jr., MD Houston SirenVIVEK J Samik Balkcom MD ELECTRONICALLY SIGNED 05/02/2014 12:54

## 2014-07-19 NOTE — H&P (Signed)
PATIENT NAMAlyson Roberts:  Roberts, Charlene MR#:  [id number] DATE OF BIRTH:  [birth year]  DATE OF ADMISSION:  04/21/2014  PRIMARY CARE PHYSICIAN:  Dr. Juanetta GoslingHawkins  CHIEF COMPLAINT: Vomiting yesterday and chest pain today.   HISTORY OF PRESENT ILLNESS: A 69 year old African American female with a history of hypertension, diabetes, ESRD, and dementia, was sent to ED from home due to vomiting yesterday and chest pain today. The patient is demented, unable to provide information. According to the patient's daughter, the patient was noticed to have vomiting yesterday. Also  patient has had weakness since yesterday, so patient was sent to ED for further evaluation. The patient developed chest pain in the middle part of the chest in ED 1 hour ago. The patient is demented. She denies any other symptoms. According to Dr. Mindi JunkerGottlieb, patient was noted to have  elevated troponin at 21, was treated with aspirin 81 mg 4 tablets nitroglycerin sublingual.   PAST MEDICAL HISTORY: Hypertension, diabetes, heart attack, CVA, ESRD, hyperlipidemia, dementia.   PAST SURGICAL HISTORY:  None.  FAMILY HISTORY:  Diabetes.   SOCIAL HISTORY: The patient lives with her daughter. No smoking or drinking or illicit drugs.   REVIEW OF SYSTEMS: The patient is demented, unable to obtain.  ALLERGIES: None.   HOME MEDICATIONS: Zetia 10 mg p.o. daily, Zantac 150 mg p.o. daily, vitamin D3, 50,000 International Units 1 cap once a day, Renvela 800 mg p.o. 3 times a day, Plavix 75 mg p.o. daily, Lipitor 40 mg p.o. at bedtime, Lantus 5 units subcutaneous once a day at bedtime, hydralazine 25 mg p.o. t.i.d., Colace 100 mg p.o. 1-2 tablets daily, Norvasc 5 mg p.o. twice a day, Actamin 325 mg 2 tablets every 4 hours p.r.n.    PHYSICAL EXAMINATION: VITAL SIGNS: Temperature 99.2, blood pressure 164/98, pulse 84, oxygen saturation at 92% on room air and 93% on oxygen.  GENERAL: The patient is awake, but is demented, only knows her name, in no acute distress.   HEENT: Pupils round, equal, reactive to light and accommodation. Moist oral mucosa. Clear oropharynx.  NECK: Supple. No JVD or carotid bruits, no lymphadenopathy, no thyromegaly.  CARDIOVASCULAR: S1, S2, regular rate, rhythm. Systolic murmur 3/6. No gallop. PULMONARY: Bilateral air entry. No wheezing or rales. No use of accessory muscle to breathe.  ABDOMEN: Soft. No distention or tenderness. No organomegaly. Bowel sounds present.  EXTREMITIES: No edema, clubbing, or cyanosis. No calf tenderness. Bilateral pedal pulses present.  SKIN: No rash or jaundice.  NEUROLOGIC: The patient is demented, followed limited commands. No focal deficit. Power 4/5. Sensation intact.   IMAGING: CAT scan of head, no acute intracranial pathology.   LABORATORY DATA: Troponin 21, glucose 159, BUN 44, creatinine 5.72, sodium 134, potassium 4.4, chloride 93, bicarbonate 33. WBC 4.8, hemoglobin 11.1, platelets 128,000.   EKG showed normal sinus rhythm at 87 BPM.   IMPRESSIONS: 1.  Acute non-ST segment elevation myocardial infarction. 2.  Hypertension.  3.  End-stage renal disease. 4.  Diabetes.  5.  Hyponatremia.  6.  Anemia of chronic disease.  7.  Thrombocytopenia.  8.  Dementia.  9.  History of cerebrovascular accident. 10.  Hyperlipidemia.   PLAN OF TREATMENT:   1.  The patient will be admitted to telemetry floor. We will continue aspirin, Plavix, and start heparin drip. Follow up troponin level.  2.  Will get a cardiology consult.  3.  Also, we will start statin.  4.  For ESRD, we will get a nephrology consult for hemodialysis.  5.  For diabetes, we will start a sliding scale, check hemoglobin A1c.  6.  For hypertension, according to patient's daughter, the patient's blood pressure has been not controlled about 170, even up to 200. The patient has been treated with Norvasc and then was added lisinopril, and blood pressure is still at 170s.  We will continue lisinopril and start Lopressor, give  hydralazine IV p.r.n.   I discussed the patient's condition and plan of treatment with the patient's daughter, who is healthcare power of attorney and she said the patient wants full code.   TIME SPENT: About 66 minutes.    ____________________________ Shaune Pollack, MD qc:LT D: 04/21/2014 17:37:00 ET T: 04/21/2014 18:23:41 ET JOB#: [id number]  cc: Shaune Pollack, MD, <Dictator> Shaune Pollack MD ELECTRONICALLY SIGNED 04/23/2014 17:38

## 2014-07-20 ENCOUNTER — Other Ambulatory Visit: Payer: Self-pay | Admitting: Vascular Surgery

## 2014-07-20 DIAGNOSIS — N186 End stage renal disease: Secondary | ICD-10-CM

## 2014-07-21 ENCOUNTER — Ambulatory Visit: Payer: Medicare Other

## 2014-07-27 ENCOUNTER — Telehealth: Payer: Self-pay

## 2014-07-27 NOTE — Telephone Encounter (Signed)
Left message for [name] to call back.

## 2014-07-27 NOTE — Telephone Encounter (Signed)
Pt c/o BP issue: STAT if pt c/o blurred vision, one-sided weakness or slurred speech  Daughter is calling  1. What are your last 5 BP readings? This morning 174/101, yesterday 169/91, 5/6 182/98, 5/2 157/90, 5/1  154/93  2. Are you having any other symptoms (ex. Dizziness, headache, blurred vision, passed out)? No, has been complaining of headache, but after fluids given, is ok.  3. What is your BP issue? elevated

## 2014-07-28 ENCOUNTER — Encounter: Payer: Self-pay | Admitting: *Deleted

## 2014-07-28 ENCOUNTER — Encounter
Admission: RE | Disposition: A | Discharge status: Home / Self Care | Payer: Medicare Other | Source: Ambulatory Visit | Attending: Vascular Surgery

## 2014-07-28 ENCOUNTER — Ambulatory Visit: Admission: RE | Admit: 2014-07-28 | Payer: Medicare Other | Source: Ambulatory Visit

## 2014-07-28 ENCOUNTER — Ambulatory Visit
Admission: RE | Admit: 2014-07-28 | Discharge: 2014-07-28 | Disposition: A | Discharge status: Home / Self Care | Payer: Medicare Other | Source: Ambulatory Visit | Attending: Vascular Surgery | Admitting: Vascular Surgery

## 2014-07-28 DIAGNOSIS — Z992 Dependence on renal dialysis: Secondary | ICD-10-CM | POA: Diagnosis not present

## 2014-07-28 DIAGNOSIS — I120 Hypertensive chronic kidney disease with stage 5 chronic kidney disease or end stage renal disease: Secondary | ICD-10-CM | POA: Insufficient documentation

## 2014-07-28 DIAGNOSIS — E119 Type 2 diabetes mellitus without complications: Secondary | ICD-10-CM | POA: Insufficient documentation

## 2014-07-28 DIAGNOSIS — Y832 Surgical operation with anastomosis, bypass or graft as the cause of abnormal reaction of the patient, or of later complication, without mention of misadventure at the time of the procedure: Secondary | ICD-10-CM | POA: Insufficient documentation

## 2014-07-28 DIAGNOSIS — Z79899 Other long term (current) drug therapy: Secondary | ICD-10-CM | POA: Insufficient documentation

## 2014-07-28 DIAGNOSIS — Z8673 Personal history of transient ischemic attack (TIA), and cerebral infarction without residual deficits: Secondary | ICD-10-CM | POA: Insufficient documentation

## 2014-07-28 DIAGNOSIS — T82868A Thrombosis of vascular prosthetic devices, implants and grafts, initial encounter: Secondary | ICD-10-CM | POA: Insufficient documentation

## 2014-07-28 DIAGNOSIS — Z7982 Long term (current) use of aspirin: Secondary | ICD-10-CM | POA: Insufficient documentation

## 2014-07-28 DIAGNOSIS — E785 Hyperlipidemia, unspecified: Secondary | ICD-10-CM | POA: Diagnosis not present

## 2014-07-28 DIAGNOSIS — N186 End stage renal disease: Secondary | ICD-10-CM | POA: Diagnosis not present

## 2014-07-28 HISTORY — PX: PERIPHERAL VASCULAR CATHETERIZATION: SHX172C

## 2014-07-28 LAB — GLUCOSE, CAPILLARY: Glucose-Capillary: 104 mg/dL — ABNORMAL HIGH (ref 70–99)

## 2014-07-28 LAB — POTASSIUM (ARMC VASCULAR LAB ONLY): Potassium (ARMC vascular lab): 4

## 2014-07-28 SURGERY — A/V SHUNTOGRAM/FISTULAGRAM
Anesthesia: Moderate Sedation | Laterality: Left | Wound class: Clean

## 2014-07-28 MED ORDER — IOHEXOL 300 MG/ML  SOLN
INTRAMUSCULAR | Status: DC | PRN
  Administered 2014-07-28: 20 mL via INTRAVENOUS
  Administered 2014-07-28: 50 mL via INTRAVENOUS

## 2014-07-28 MED ORDER — HEPARIN SODIUM (PORCINE) 1000 UNIT/ML IJ SOLN
INTRAMUSCULAR | Status: AC
  Filled 2014-07-28: qty 1

## 2014-07-28 MED ORDER — CEFAZOLIN SODIUM 1-5 GM-% IV SOLN
INTRAVENOUS | Status: AC
  Filled 2014-07-28: qty 50

## 2014-07-28 MED ORDER — FENTANYL CITRATE (PF) 100 MCG/2ML IJ SOLN
INTRAMUSCULAR | Status: DC | PRN
  Administered 2014-07-28 (×2): 50 ug via INTRAVENOUS

## 2014-07-28 MED ORDER — HEPARIN SODIUM (PORCINE) 1000 UNIT/ML IJ SOLN
INTRAMUSCULAR | Status: DC | PRN
  Administered 2014-07-28: 3000 [IU] via INTRAVENOUS
  Administered 2014-07-28: 2000 [IU] via INTRAVENOUS

## 2014-07-28 MED ORDER — MIDAZOLAM HCL 2 MG/2ML IJ SOLN
INTRAMUSCULAR | Status: DC | PRN
  Administered 2014-07-28: 1 mg via INTRAVENOUS
  Administered 2014-07-28: 2 mg via INTRAVENOUS

## 2014-07-28 MED ORDER — SODIUM CHLORIDE 0.9 % IV SOLN
INTRAVENOUS | Status: DC
  Administered 2014-07-28: 15:00:00 via INTRAVENOUS

## 2014-07-28 MED ORDER — LIDOCAINE HCL (PF) 1 % IJ SOLN
INTRAMUSCULAR | Status: AC
  Filled 2014-07-28: qty 10

## 2014-07-28 MED ORDER — FENTANYL CITRATE (PF) 100 MCG/2ML IJ SOLN
INTRAMUSCULAR | Status: AC
  Filled 2014-07-28: qty 2

## 2014-07-28 MED ORDER — CEFAZOLIN SODIUM 1-5 GM-% IV SOLN
1.0000 g | Freq: Once | INTRAVENOUS | Status: AC
  Administered 2014-07-28: 1 g via INTRAVENOUS
  Filled 2014-07-28: qty 50

## 2014-07-28 MED ORDER — MIDAZOLAM HCL 5 MG/5ML IJ SOLN
INTRAMUSCULAR | Status: AC
  Filled 2014-07-28: qty 5

## 2014-07-28 MED ORDER — HEPARIN (PORCINE) IN NACL 2-0.9 UNIT/ML-% IJ SOLN
INTRAMUSCULAR | Status: AC
  Filled 2014-07-28: qty 1000

## 2014-07-28 SURGICAL SUPPLY — 12 items
BALLN DORADO 8X60X80 (BALLOONS) ×6
BALLN LUTONIX DCB 7X40X130 (BALLOONS) ×3
BALLOON DORADO 8X60X80 (BALLOONS) ×2 IMPLANT
BALLOON LUTONIX DCB 7X40X130 (BALLOONS) ×1 IMPLANT
CATH SLIP KMP 65CM 5FR (CATHETERS) ×3 IMPLANT
DEVICE PRESTO INFLATION (MISCELLANEOUS) ×3 IMPLANT
DRAPE BRACHIAL (DRAPES) ×3 IMPLANT
PACK ANGIOGRAPHY (CUSTOM PROCEDURE TRAY) ×3 IMPLANT
SET INTRO CAPELLA COAXIAL (SET/KITS/TRAYS/PACK) ×6 IMPLANT
SHEATH BRITE TIP 6FRX5.5 (SHEATH) ×6 IMPLANT
TOWEL OR 17X26 4PK STRL BLUE (TOWEL DISPOSABLE) ×3 IMPLANT
WIRE MAGIC TOR.035 180C (WIRE) ×3 IMPLANT

## 2014-07-28 NOTE — Telephone Encounter (Signed)
Spoke w/ [name].  She is appreciative of the call, but states that pt is sched to have her abscess looked at today.  Asked her to call back if we can be of further assistance.

## 2014-07-28 NOTE — H&P (Signed)
[facility] Admission History & Physical  MRN : [id number]  [name] is a 69 y.o. (05/24/1945) female who presents with chief complaint of No chief complaint on file. [name].  History of Present Illness: The patient is sent by the dialysis center secondary to problems with the left arm forearm AV loop graft. The problems include prolonged bleeding as well as difficulty with cannulation. The duration of this has been for several weeks. This occurs with every dialysis session.  Current Facility-Administered Medications  Medication Dose Route Frequency Provider Last Rate Last Dose  . 0.9 %  sodium chloride infusion   Intravenous Continuous Renford DillsGregory G Clarabell Matsuoka, MD 20 mL/hr at 07/28/14 1437    . ceFAZolin (ANCEF) IVPB 1 g/50 mL premix  1 g Intravenous Once Renford DillsGregory G Leean Amezcua, MD        Past Medical History  Diagnosis Date  . Stroke   . MI (myocardial infarction)   . Hearing impaired   . ESRD (end stage renal disease)   . Hyperlipidemia   . Hypertension   . Diabetes mellitus without complication   . Depression   . Coronary artery disease   . Dementia   . Secondary hyperparathyroidism   . Dialysis patient     Monday, Wednesday and Friday.   . Chronic systolic CHF (congestive heart failure)   . GERD (gastroesophageal reflux disease)   . Personal history of transient ischemic attack (TIA) and cerebral infarction without residual deficit   . Anemia     Past Surgical History  Procedure Laterality Date  . Cataract extraction, bilateral    . Wrist surgery      Social History History  Substance Use Topics  . Smoking status: Never Smoker   . Smokeless tobacco: Not on file  . Alcohol Use: No    Family History Family History  Problem Relation Age of Onset  . Family history unknown: Yes   no family history of porphyria or autoimmune disease  No Known Allergies   REVIEW OF SYSTEMS (Negative unless checked)  Constitutional: [] Weight loss  [] Fever   [] Chills Cardiac: [] Chest pain   [] Chest pressure   [] Palpitations   [] Shortness of breath when laying flat   [] Shortness of breath at rest   [] Shortness of breath with exertion. Vascular:  [] Pain in legs with walking   [] Pain in legs at rest   [] Pain in legs when laying flat   [] Claudication   [] Pain in feet when walking  [] Pain in feet at rest  [] Pain in feet when laying flat   [] History of DVT   [] Phlebitis   [] Swelling in legs   [] Varicose veins   [] Non-healing ulcers Pulmonary:   [] Uses home oxygen   [] Productive cough   [] Hemoptysis   [] Wheeze  [] COPD   [] Asthma Neurologic:  [] Dizziness  [] Blackouts   [] Seizures   [] History of stroke   [] History of TIA  [] Aphasia   [] Temporary blindness   [] Dysphagia   [] Weakness or numbness in arms   [] Weakness or numbness in legs Musculoskeletal:  [] Arthritis   [] Joint swelling   [] Joint pain   [] Low back pain Hematologic:  [] Easy bruising  [] Easy bleeding   [] Hypercoagulable state   [] Anemic  [] Hepatitis Gastrointestinal:  [] Blood in stool   [] Vomiting blood  [] Gastroesophageal reflux/heartburn   [] Difficulty swallowing. Genitourinary:  [] Chronic kidney disease   [] Difficult urination  [] Frequent urination  [] Burning with urination   [] Blood in urine Skin:  [] Rashes   [] Ulcers   [] Wounds Psychological:  []   History of anxiety   []  History of major depression.  Physical Examination  Filed Vitals:   07/28/14 1418  BP: 145/81  Pulse: 68  Temp: 98.3 F (36.8 C)  TempSrc: Oral  Resp: 14  Height: 5\' 3"  (1.6 m)  Weight: 123 lb (55.792 kg)  SpO2: 96%   Body mass index is 21.79 kg/(m^2).  Head: [place]/AT, No temporalis wasting.  Ear/Nose/Throat: Hearing grossly intact, nares w/o erythema or drainage, oropharynx w/o Erythema/Exudate, Mallampati score: Class II.  Dentition poor.  Eyes: PERRLA, EOMI.  Neck: Supple, no nuchal rigidity.  No bruit or JVD.  Pulmonary:  Good air movement, clear to auscultation bilaterally, no increased work of respiration or use of  accessory muscles  Cardiac: RRR, normal S1, S2, no Murmurs, rubs or gallops. Vascular: Left forearm AV loop graft with marked pulsatility. No evidence of purulence or drainage. Vessel Right Left  Radial Palpable Palpable  Ulnar Palpable Palpable  Brachial Palpable Palpable  Carotid Palpable, without bruit Palpable, without bruit  Aorta Not palpable N/A  Femoral Palpable Palpable  Popliteal Palpable Palpable  PT Palpable Palpable  DP Palpable Palpable   Gastrointestinal: soft, non-tender/non-distended. No guarding/reflex. No masses, surgical incisions, or scars. Musculoskeletal: M/S 5/5 throughout.  No deformity or atrophy. Neurologic: CN 2-12 intact. Pain and light touch intact in extremities.  Symmetrical.  Speech is fluent. Motor exam as listed above. Psychiatric: Judgment intact, Mood & affect appropriate for pt's clinical situation. Dermatologic: No rashes or ulcers noted.  No cellulitis or open wounds. Lymph : No Cervical, Axillary, or Inguinal lymphadenopathy.  Diagnostic Studies None     CBC Lab Results  Component Value Date   WBC 4.1 04/24/2014   HGB 8.3* 04/24/2014   HCT 25.1* 04/24/2014   MCV 90 04/24/2014   PLT 123* 04/24/2014    BMET    Component Value Date/Time   NA 133* 04/24/2014 1037   K 4.5 04/24/2014 1037   CL 93* 04/24/2014 1037   CO2 30 04/24/2014 1037   GLUCOSE 229* 04/24/2014 1037   BUN 48* 04/24/2014 1037   CREATININE 6.12* 04/24/2014 1037   CALCIUM 8.1* 04/24/2014 1037   GFRNONAA 15* 07/16/2013 1750   GFRAA 18* 07/16/2013 1750   CrCl cannot be calculated (Patient has no serum creatinine result on file.).  COAG Lab Results  Component Value Date   INR 0.9 04/21/2014    Assessment/Plan 1. The patient presents with complications of her AV access. This will require angiography with the hope for intervention. Risks and benefits have been reviewed all questions answered patient has agreed to proceed. 2.  End-stage renal disease requiring  hemodialysis. She will continue her dialysis via her forearm access however should this not be salvageable she is aware that a tunneled catheter will be placed so that she can maintain her dialysis schedule. 3.  Hypertension patient will continue her home meds 4.  Diabetes mellitus. Patient will be monitored for her blood sugars she will be treated with insulin sliding scale periprocedurally. She will continue her home diabetic regime upon discharge. 5. Hyperlipidemia she will continue her statin.   Saman Giddens, Latina CraverGregory G, MD  07/28/2014 3:11 PM

## 2014-07-28 NOTE — Discharge Instructions (Signed)

## 2014-07-28 NOTE — Op Note (Signed)
OPERATIVE NOTE   PROCEDURE: 1. left forearm loop arteriovenous graft cannulation under ultrasound guidance 2. left arm graftogram 3. Percutaneous transluminal angioplasty of the cephalic vein above the elbow 4. Percutaneous transluminal angioplasty of the forearm loop graft venous cannulation zone 5. Percutaneous transluminal angioplasty of the arterial portion forearm loop graft  PRE-OPERATIVE DIAGNOSIS: Complication left arteriovenous forearm loop graft                                                       End Stage Renal Disease  POST-OPERATIVE DIAGNOSIS: same as above   SURGEON: Katha Cabal, M.D.  ANESTHESIA: Conscious Sedation   ESTIMATED BLOOD LOSS: minimal  FINDING(S): 1. Strictures of the venous outflow in the proximal cephalic at the mid biceps level. 2. Greater than 80% narrowing of the venous portion of the left forearm loop graft. 3. Greater than 70% narrowing of the arterial portion of the left forearm loop graft  SPECIMEN(S):  None  CONTRAST: 60 cc Isovue  FLUOROSCOPY TIME: 3.1 minutes  INDICATIONS: [name] is a 69 y.o. female who  presents with malfunctioning left AV access.  The patient is scheduled for left forearm loop graft angiography and intervention .  The patient is aware the risks include but are not limited to: bleeding, infection, thrombosis of the cannulated access, and possible anaphylactic reaction to the contrast.  The patient acknowledges if the access can not be salvaged a tunneled catheter will be needed and will be placed during this procedure.  The patient is aware of the risks of the procedure and elects to proceed with the angiogram and intervention.  DESCRIPTION: After full informed written consent was obtained, the patient was brought back to the Special Procedure suite and placed supine position.  Appropriate cardiopulmonary monitors were placed.  The left arm was prepped and draped in the standard fashion.  Appropriate timeout  is called. The left forearm loop graft  was cannulated with a micropuncture needle in an antegrade direction.  The microwire was advanced and the needle was exchanged for  a microsheath.  The J-wire was then advanced and a 6 Fr sheath inserted.  Hand injections were completed to image the access from the arterial anastomosis through the entire access.  The central venous structures were also imaged by hand injections.  In a similar fashion, a 6 French sheath was placed in the retrograde direction into the forearm loop graft so that the arterial portion can be treated.  Based on the images,  3 separate and distinct lesions are identified. There is a string sign noted in the cephalic vein which is the only outflow at the mid biceps level above the antecubital fossa. This is separate and distinct from the graft which is a forearm loop graft. A second lesion of greater than 80% is noted along the lateral portion of the graft in the area where the venous cannulation occurs. A third separate and distinct lesion is noted extending to the arterial anastomosis within the graft and this required an additional sheath in the retrograde direction to be treated.  Having evaluated the diagnostic images 3000 units of heparin was given. A Magic torque wire was advanced through the antegrade sheath and negotiated across both the lesion within the graft as well as the lesion within the cephalic vein. Initially the cephalic lesion is  treated it is dilated with a 5 x 4 Dorado balloon to 18 atm for 1 minute. A 7 x 4 Lutonix balloon is then advanced across the lesion inflated to 14 atm for 3 full minutes. Follow-up imaging demonstrates less than 10% residual stenosis and attention is then turned to the lesion within the forearm loop graft itself. An 8 x 6 Dorado balloon is advanced across this lesion and inflated to 20 atm for approximately 1-1/2 minutes. Again follow-up imaging demonstrates less than 10% residual stenosis. Also  noted is a lack of forward flow within the graft. Upon recompression of the graft and injection through the antegrade sheath. The sheath is occlusive through lesion which extends toward the arterial anastomosis. This then requires that a second retrograde sheath be placed. Magic torque wire is then advanced through the retrograde sheath and negotiated out into the brachial artery where injection demonstrates the stenosis within the arterial portion the 8 x 6 Dorado balloon is then advanced over the wire across this lesion and inflated to 16 atm for proximally 2 minutes. Follow-up imaging from the brachial artery demonstrates that there is less than 10% residual stenosis and there is now a rapid flow of contrast through the AV loop graft.    A 4-0 Monocryl purse-string suture was sewn around both of the sheaths.  The sheaths are removed and light pressure was applied.  A sterile bandage was applied to the puncture site.   INTERPRETATION: Initial views in a retrograde direction demonstrate 2 separate and distinct lesions one in the venous outflow more proximally on the arm and the other within the graft itself in the venous cannulation site. As the case progresses a third critical stenosis is noted in the arterial portion at the level of the anastomosis. All 3 of these lesions are well treated as described above with less than 10% residual stenosis at the conclusion of the procedure.    COMPLICATIONS: None  CONDITION: [name], M.D [facility] Office: [phone]  07/28/2014 7:32 PM

## 2014-07-29 ENCOUNTER — Encounter: Payer: Self-pay | Admitting: Vascular Surgery

## 2014-07-29 ENCOUNTER — Telehealth: Payer: Self-pay | Admitting: *Deleted

## 2014-07-29 NOTE — Telephone Encounter (Signed)
Pt daughter calling having question about medication  Isosorbide her previous dosage is 1 once a day and new prescription says 4 once a day   Pt is being given the old dosage, for daughter was not sure if this was a mistake or is there a change she did not know about.  Please advise.

## 2014-07-29 NOTE — Telephone Encounter (Signed)
Pt is taking Isosorbide 30 mg 1 Tablet by mouth daily. Pt was never taking Isosorbide 30 mg QID. She was taking Isosorbide during dialysis 4 times a week but she is currently taking 1 tablet by mouth daily. She believes her isosorbide was confused at last ov when she mentioned that she was taking medication 4 times a week during dialysis days. Please advise pt daughter for she is confused and would like to just make sure that Dr. Mariah MillingGollan is aware that pt has not been taking Isosorbide 30 mg QID as seen in last OV.

## 2014-08-11 ENCOUNTER — Ambulatory Visit (INDEPENDENT_AMBULATORY_CARE_PROVIDER_SITE_OTHER): Payer: Medicare Other | Admitting: Cardiovascular Disease

## 2014-08-11 ENCOUNTER — Encounter: Payer: Self-pay | Admitting: Cardiovascular Disease

## 2014-08-11 VITALS — BP 133/69 | HR 64 | Ht 63.0 in | Wt 133.0 lb

## 2014-08-11 DIAGNOSIS — I5022 Chronic systolic (congestive) heart failure: Secondary | ICD-10-CM

## 2014-08-11 DIAGNOSIS — I350 Nonrheumatic aortic (valve) stenosis: Secondary | ICD-10-CM | POA: Diagnosis not present

## 2014-08-11 DIAGNOSIS — I159 Secondary hypertension, unspecified: Secondary | ICD-10-CM

## 2014-08-11 DIAGNOSIS — E785 Hyperlipidemia, unspecified: Secondary | ICD-10-CM | POA: Diagnosis not present

## 2014-08-11 DIAGNOSIS — E1159 Type 2 diabetes mellitus with other circulatory complications: Secondary | ICD-10-CM

## 2014-08-11 MED ORDER — ISOSORBIDE MONONITRATE ER 30 MG PO TB24: 30.0000 mg | ORAL_TABLET | Freq: Every day | ORAL | Status: DC

## 2014-08-11 MED ORDER — ATORVASTATIN CALCIUM 80 MG PO TABS: 80.0000 mg | ORAL_TABLET | Freq: Every day | ORAL | Status: DC

## 2014-08-11 NOTE — Progress Notes (Signed)
Patient ID: [name], female    DOB: 06/30/1945, 69 y.o.   MRN: [id number]  HPI Comments: [name] is a pleasant 69 year old woman with end-stage renal disease on HD on (Monday, Wednesday, Friday), HTN, hyperlipidemia, who presented to [facility] with weakness, vomiting, with non-ST elevation MI, troponin 21, managed medically, ejection fraction 40-50% with basal to mid inferior wall hypokinesis, moderate aortic valve stenosis, severe LVH, moderate pulmonary hypertension. She presents for routine follow-up of her coronary artery disease, aortic valve stenosis, outflow tract gradient  In follow-up today she presents with her daughter.  Patient has been doing well, no recent falls. Eating well. Does exercises several days per week Denies any chest pain or shortness of breath with exertion   previous Lab work showing total cholesterol 278, LDL 167, hemoglobin A1c 5.8, this was on Lipitor 20 mg daily   These results were discussed with patient's daughter today  She has been taking Lipitor 20 mg daily, daughter reports dose was increased up to 80 mg for a short period of time, prescription was renewed for 20 mg daily though she does not know why the dose was  decreased     No Known Allergies  Outpatient Encounter Prescriptions as of 08/11/2014  Medication Sig  . aspirin 81 MG tablet Take 81 mg by mouth daily.  [name] atorvastatin (LIPITOR) 80 MG tablet Take 1 tablet (80 mg total) by mouth daily.  . calcium acetate (PHOSLO) 667 MG capsule Take 667 mg by mouth.  . Cholecalciferol (VITAMIN D-3 PO) Take 1 capsule by mouth every morning.  . clopidogrel (PLAVIX) 75 MG tablet Take 75 mg by mouth daily.  [name] docusate sodium (COLACE) 100 MG capsule Take 100 mg by mouth daily.  . ergocalciferol (VITAMIN D2) 50000 UNITS capsule Take 50,000 Units by mouth every 30 (thirty) days.   [name] ezetimibe (ZETIA) 10 MG tablet Take 10 mg by mouth daily.  . hydrALAZINE (APRESOLINE) 25 MG tablet Take 1 tablet (25 mg total) by  mouth 3 (three) times daily. (Patient taking differently: Take 25 mg by mouth 2 (two) times daily. )  . Lactobacillus (PROBIOTIC ACIDOPHILUS PO) Take 1 capsule by mouth every morning.  [name] lisinopril (PRINIVIL,ZESTRIL) 20 MG tablet Take 1 tablet (20 mg total) by mouth daily.  . metoprolol tartrate (LOPRESSOR) 25 MG tablet Take 1 tablet (25 mg total) by mouth 2 (two) times daily.  . Omega-3 Fatty Acids (FISH OIL) 1000 MG CPDR Take by mouth 2 (two) times daily.  . promethazine (PHENERGAN) 25 MG tablet Take 25 mg by mouth every 6 (six) hours as needed for nausea or vomiting.  . ranitidine (ZANTAC) 150 MG capsule Take 150 mg by mouth every evening.  . vitamin C (ASCORBIC ACID) 500 MG tablet Take 1,000 mg by mouth daily.  . [DISCONTINUED] atorvastatin (LIPITOR) 20 MG tablet Take 20 mg by mouth daily.  . [DISCONTINUED] isosorbide dinitrate (ISORDIL) 30 MG tablet Take 30 mg by mouth daily.  . isosorbide mononitrate (IMDUR) 30 MG 24 hr tablet Take 1 tablet (30 mg total) by mouth daily.  . [DISCONTINUED] atorvastatin (LIPITOR) 80 MG tablet Take 80 mg by mouth daily at 6 PM.  . [DISCONTINUED] Calcium Acetate, Phos Binder, 667 MG CAPS Take by mouth 3 (three) times daily.  . [DISCONTINUED] isosorbide dinitrate (ISORDIL) 30 MG tablet Take 1 tablet (30 mg total) by mouth 4 (four) times daily. (Patient not taking: Reported on 08/11/2014)   No facility-administered encounter medications on file as of 08/11/2014.  Past Medical History  Diagnosis Date  . Stroke   . MI (myocardial infarction)   . Hearing impaired   . ESRD (end stage renal disease)   . Hyperlipidemia   . Hypertension   . Diabetes mellitus without complication   . Depression   . Coronary artery disease   . Dementia   . Secondary hyperparathyroidism   . Dialysis patient     Monday, Wednesday and Friday.   . Chronic systolic CHF (congestive heart failure)   . GERD (gastroesophageal reflux disease)   . Personal history of transient ischemic  attack (TIA) and cerebral infarction without residual deficit   . Anemia     Past Surgical History  Procedure Laterality Date  . Cataract extraction, bilateral    . Wrist surgery    . Peripheral vascular catheterization Left 07/28/2014    Procedure: A/V Shuntogram/Fistulagram;  Surgeon: Renford DillsGregory G Schnier, MD;  Location: [facility] INVASIVE CV LAB;  Service: Cardiovascular;  Laterality: Left;  . Peripheral vascular catheterization Left 07/28/2014    Procedure: A/V Shunt Intervention;  Surgeon: Renford DillsGregory G Schnier, MD;  Location: [facility] INVASIVE CV LAB;  Service: Cardiovascular;  Laterality: Left;    Social History  reports that she has never smoked. She does not have any smokeless tobacco history on file. She reports that she does not drink alcohol or use illicit drugs.  Family History Family history is unknown by patient.   Review of Systems  Constitutional: Negative.   Respiratory: Negative.   Cardiovascular: Negative.   Gastrointestinal: Negative.   Musculoskeletal: Positive for gait problem.  Skin: Negative.   Neurological: Negative.   Hematological: Negative.   Psychiatric/Behavioral: Negative.        Relatively nonverbal  All other systems reviewed and are negative.  BP 133/69 mmHg  Pulse 64  Ht 5\' 3"  (1.6 m)  Wt 133 lb (60.328 kg)  BMI 23.57 kg/m2   Physical Exam  Constitutional: She is oriented to person, place, and time. She appears well-developed and well-nourished.  Sleepy  HENT:  Head: Normocephalic.  Nose: Nose normal.  Mouth/Throat: Oropharynx is clear and moist.  Eyes: Conjunctivae are normal. Pupils are equal, round, and reactive to light.  Neck: Normal range of motion. Neck supple. No JVD present.  Cardiovascular: Normal rate, regular rhythm, S1 normal, S2 normal and intact distal pulses.  Exam reveals no gallop and no friction rub.   Murmur heard.  Systolic murmur is present with a grade of 3/6  Pulmonary/Chest: Effort normal and breath sounds normal. No  respiratory distress. She has no wheezes. She has no rales. She exhibits no tenderness.  Abdominal: Soft. Bowel sounds are normal. She exhibits no distension. There is no tenderness.  Musculoskeletal: Normal range of motion. She exhibits no edema or tenderness.  Lymphadenopathy:    She has no cervical adenopathy.  Neurological: She is alert and oriented to person, place, and time. Coordination normal.  Skin: Skin is warm and dry. No rash noted. No erythema.  Psychiatric: She has a normal mood and affect. Her behavior is normal. Judgment and thought content normal.    Assessment and Plan  Nursing note and vitals reviewed.

## 2014-08-11 NOTE — Assessment & Plan Note (Signed)
Blood pressure is well controlled on today's visit. No changes made to the medications. 

## 2014-08-11 NOTE — Assessment & Plan Note (Signed)
Ejection fraction 40-50% on echocardiogram, basal to mid inferior wall hypokinesis likely from CAD, RCA territory She appears euvolemic on today's visit

## 2014-08-11 NOTE — Patient Instructions (Signed)
You are doing well.  Please increase the lipitor up to 80 mg daily  Please call us if you have new issues that need to be addressed before your next appt.  Your physician wants you to follow-up in: 6 months.  You will receive a reminder letter in the mail two months in advance. If you don't receive a letter, please call our office to schedule the follow-up appointment.   

## 2014-08-11 NOTE — Assessment & Plan Note (Signed)
Relatively sedentary, asymptomatic from her aortic valve stenosis

## 2014-08-11 NOTE — Assessment & Plan Note (Signed)
We will increase Lipitor back up to 80 mg daily. This was renewed at the lower dose. No complications per the daughter on the higher dose

## 2014-08-11 NOTE — Assessment & Plan Note (Addendum)
We have encouraged continued exercise, careful diet management   

## 2014-09-07 ENCOUNTER — Telehealth: Payer: Self-pay

## 2014-09-07 ENCOUNTER — Other Ambulatory Visit: Payer: Self-pay

## 2014-09-07 MED ORDER — CLOPIDOGREL BISULFATE 75 MG PO TABS: 75.0000 mg | ORAL_TABLET | Freq: Every day | ORAL | Status: DC

## 2014-09-07 NOTE — Telephone Encounter (Signed)
Left message for Eleanor to call back.

## 2014-09-07 NOTE — Telephone Encounter (Signed)
Pt daugter called states pt feet are still swollen Pt c/o swelling: STAT is pt has developed SOB within 24 hours  1. How long have you been experiencing swelling? Since the last visit with Dr. Mariah Milling  2. Where is the swelling located? Both feet, but not today  3.  Are you currently taking a "fluid pill"? No  4.  Are you currently SOB? no  5.  Have you traveled recently? no

## 2014-09-11 ENCOUNTER — Telehealth: Payer: Self-pay | Admitting: Family Medicine

## 2014-09-11 NOTE — Telephone Encounter (Signed)
Pt will be placed at [facility] (July 2-4) and they are requesting a PCL for the pt. Daughter will fax additional information.

## 2014-09-15 NOTE — Telephone Encounter (Signed)
I spoke to her the day they called but the forms were not in yet. You may want to check with [employer] or see if they have been put on [employer] desk. Just saw that you had this message last so if you need me to do this I will but I did talk to them just have not seen forms.

## 2014-09-16 ENCOUNTER — Encounter: Payer: Self-pay | Admitting: Family Medicine

## 2014-09-24 ENCOUNTER — Telehealth: Payer: Self-pay | Admitting: *Deleted

## 2014-09-24 NOTE — Telephone Encounter (Signed)
Spoke w/ Vena AustriaEleanor.  She reports that at pt's last ov on 08/11/14, pt's ov, her Lipitor was increased from 20 mg to 80 mg and that Dr. Mariah MillingGollan had advised that the Zetia was not helping pt very much.  She would like to d/c this, as the price has gone up to over $300/month.  Advised her that I will update her med list.

## 2014-09-24 NOTE — Telephone Encounter (Signed)
Pt c/o medication issue:  1. Name of Medication: Zetia  2. How are you currently taking this medication (dosage and times per day)? 10 mg 1 time a day  3. Are you having a reaction (difficulty breathing--STAT)? no  4. What is your medication issue? Daughter called and wants to know if her mother should continue this medication.  Please call Vena Austrialeanor her daughter back. Thanks

## 2014-10-02 ENCOUNTER — Other Ambulatory Visit: Payer: Self-pay | Admitting: Family Medicine

## 2014-10-02 MED ORDER — RANITIDINE HCL 150 MG PO CAPS: 150.0000 mg | ORAL_CAPSULE | Freq: Every evening | ORAL | Status: DC

## 2014-11-25 ENCOUNTER — Other Ambulatory Visit: Payer: Self-pay | Admitting: Cardiovascular Disease

## 2014-12-02 ENCOUNTER — Emergency Department: Payer: Medicare Other

## 2014-12-02 ENCOUNTER — Encounter: Payer: Self-pay | Admitting: Emergency Medicine

## 2014-12-02 ENCOUNTER — Inpatient Hospital Stay
Admission: EM | Admit: 2014-12-02 | Discharge: 2014-12-05 | DRG: 100 | Disposition: A | Discharge status: Skilled Nursing Facility | Payer: Medicare Other | Attending: Internal Medicine | Admitting: Internal Medicine

## 2014-12-02 DIAGNOSIS — N2581 Secondary hyperparathyroidism of renal origin: Secondary | ICD-10-CM | POA: Diagnosis present

## 2014-12-02 DIAGNOSIS — I252 Old myocardial infarction: Secondary | ICD-10-CM | POA: Diagnosis not present

## 2014-12-02 DIAGNOSIS — N186 End stage renal disease: Secondary | ICD-10-CM | POA: Diagnosis present

## 2014-12-02 DIAGNOSIS — F0390 Unspecified dementia without behavioral disturbance: Secondary | ICD-10-CM | POA: Diagnosis present

## 2014-12-02 DIAGNOSIS — Z8673 Personal history of transient ischemic attack (TIA), and cerebral infarction without residual deficits: Secondary | ICD-10-CM | POA: Diagnosis not present

## 2014-12-02 DIAGNOSIS — R569 Unspecified convulsions: Principal | ICD-10-CM

## 2014-12-02 DIAGNOSIS — H9190 Unspecified hearing loss, unspecified ear: Secondary | ICD-10-CM | POA: Diagnosis present

## 2014-12-02 DIAGNOSIS — I120 Hypertensive chronic kidney disease with stage 5 chronic kidney disease or end stage renal disease: Secondary | ICD-10-CM | POA: Diagnosis present

## 2014-12-02 DIAGNOSIS — G9389 Other specified disorders of brain: Secondary | ICD-10-CM | POA: Diagnosis present

## 2014-12-02 DIAGNOSIS — I5022 Chronic systolic (congestive) heart failure: Secondary | ICD-10-CM | POA: Diagnosis present

## 2014-12-02 DIAGNOSIS — D631 Anemia in chronic kidney disease: Secondary | ICD-10-CM | POA: Diagnosis present

## 2014-12-02 DIAGNOSIS — R531 Weakness: Secondary | ICD-10-CM | POA: Diagnosis present

## 2014-12-02 DIAGNOSIS — R748 Abnormal levels of other serum enzymes: Secondary | ICD-10-CM | POA: Diagnosis present

## 2014-12-02 DIAGNOSIS — E785 Hyperlipidemia, unspecified: Secondary | ICD-10-CM | POA: Diagnosis present

## 2014-12-02 DIAGNOSIS — E1122 Type 2 diabetes mellitus with diabetic chronic kidney disease: Secondary | ICD-10-CM | POA: Diagnosis present

## 2014-12-02 DIAGNOSIS — Z7982 Long term (current) use of aspirin: Secondary | ICD-10-CM | POA: Diagnosis not present

## 2014-12-02 DIAGNOSIS — Z992 Dependence on renal dialysis: Secondary | ICD-10-CM

## 2014-12-02 DIAGNOSIS — Z79899 Other long term (current) drug therapy: Secondary | ICD-10-CM

## 2014-12-02 DIAGNOSIS — I2510 Atherosclerotic heart disease of native coronary artery without angina pectoris: Secondary | ICD-10-CM | POA: Diagnosis present

## 2014-12-02 DIAGNOSIS — F329 Major depressive disorder, single episode, unspecified: Secondary | ICD-10-CM | POA: Diagnosis present

## 2014-12-02 LAB — CBC WITH DIFFERENTIAL/PLATELET
BASOS PCT: 2 %
Basophils Absolute: 0.1 10*3/uL (ref 0–0.1)
EOS ABS: 0.1 10*3/uL (ref 0–0.7)
EOS PCT: 2 %
HCT: 30.5 % — ABNORMAL LOW (ref 35.0–47.0)
Hemoglobin: 10.2 g/dL — ABNORMAL LOW (ref 12.0–16.0)
LYMPHS ABS: 0.6 10*3/uL — AB (ref 1.0–3.6)
Lymphocytes Relative: 14 %
MCH: 28.7 pg (ref 26.0–34.0)
MCHC: 33.3 g/dL (ref 32.0–36.0)
MCV: 86.3 fL (ref 80.0–100.0)
MONO ABS: 0.5 10*3/uL (ref 0.2–0.9)
MONOS PCT: 11 %
NEUTROS PCT: 71 %
Neutro Abs: 2.9 10*3/uL (ref 1.4–6.5)
PLATELETS: 203 10*3/uL (ref 150–440)
RBC: 3.53 MIL/uL — ABNORMAL LOW (ref 3.80–5.20)
RDW: 20.9 % — AB (ref 11.5–14.5)
WBC: 4.1 10*3/uL (ref 3.6–11.0)

## 2014-12-02 LAB — COMPREHENSIVE METABOLIC PANEL
ALBUMIN: 3.7 g/dL (ref 3.5–5.0)
ALK PHOS: 115 U/L (ref 38–126)
ALT: 30 U/L (ref 14–54)
ANION GAP: 15 (ref 5–15)
AST: 36 U/L (ref 15–41)
BUN: 55 mg/dL — ABNORMAL HIGH (ref 6–20)
CALCIUM: 9 mg/dL (ref 8.9–10.3)
CO2: 29 mmol/L (ref 22–32)
Chloride: 91 mmol/L — ABNORMAL LOW (ref 101–111)
Creatinine, Ser: 7.85 mg/dL — ABNORMAL HIGH (ref 0.44–1.00)
GFR calc non Af Amer: 5 mL/min — ABNORMAL LOW (ref >60)
GFR, EST AFRICAN AMERICAN: 5 mL/min — AB (ref >60)
GLUCOSE: 163 mg/dL — AB (ref 65–99)
POTASSIUM: 4.9 mmol/L (ref 3.5–5.1)
SODIUM: 135 mmol/L (ref 135–145)
Total Bilirubin: 0.7 mg/dL (ref 0.3–1.2)
Total Protein: 7.2 g/dL (ref 6.5–8.1)

## 2014-12-02 LAB — TROPONIN I: Troponin I: 0.39 ng/mL — ABNORMAL HIGH (ref <0.031)

## 2014-12-02 LAB — URINALYSIS COMPLETE WITH MICROSCOPIC (ARMC ONLY)
BACTERIA UA: NONE SEEN
BILIRUBIN URINE: NEGATIVE
GLUCOSE, UA: 50 mg/dL — AB
HGB URINE DIPSTICK: NEGATIVE
KETONES UR: NEGATIVE mg/dL
Leukocytes, UA: NEGATIVE
NITRITE: NEGATIVE
Protein, ur: >500 mg/dL — AB
Specific Gravity, Urine: 1.016 (ref 1.005–1.030)
pH: 7 (ref 5.0–8.0)

## 2014-12-02 LAB — APTT: APTT: 34 s (ref 24–36)

## 2014-12-02 LAB — PROTIME-INR
INR: 1.13
PROTHROMBIN TIME: 14.7 s (ref 11.4–15.0)

## 2014-12-02 LAB — MRSA PCR SCREENING: MRSA by PCR: NEGATIVE

## 2014-12-02 MED ORDER — CALCIUM ACETATE 667 MG PO CAPS
1334.0000 mg | ORAL_CAPSULE | Freq: Three times a day (TID) | ORAL | Status: DC
  Administered 2014-12-03 – 2014-12-05 (×5): 1334 mg via ORAL
  Filled 2014-12-02 (×13): qty 2

## 2014-12-02 MED ORDER — VITAMIN D (ERGOCALCIFEROL) 1.25 MG (50000 UNIT) PO CAPS
50000.0000 [IU] | ORAL_CAPSULE | ORAL | Status: DC
  Administered 2014-12-02: 50000 [IU] via ORAL
  Filled 2014-12-02: qty 1

## 2014-12-02 MED ORDER — ASPIRIN EC 81 MG PO TBEC
81.0000 mg | DELAYED_RELEASE_TABLET | Freq: Every day | ORAL | Status: DC
  Administered 2014-12-03 – 2014-12-05 (×3): 81 mg via ORAL
  Filled 2014-12-02 (×3): qty 1

## 2014-12-02 MED ORDER — SODIUM CHLORIDE 0.9 % IV SOLN
200.0000 mg | Freq: Once | INTRAVENOUS | Status: AC
  Administered 2014-12-02: 200 mg via INTRAVENOUS
  Filled 2014-12-02: qty 20

## 2014-12-02 MED ORDER — ONDANSETRON HCL 4 MG/2ML IJ SOLN: 4.0000 mg | Freq: Four times a day (QID) | INTRAMUSCULAR | Status: DC | PRN

## 2014-12-02 MED ORDER — OMEGA-3-ACID ETHYL ESTERS 1 G PO CAPS
1.0000 g | ORAL_CAPSULE | Freq: Every day | ORAL | Status: DC
  Administered 2014-12-03 – 2014-12-05 (×3): 1 g via ORAL
  Filled 2014-12-02 (×3): qty 1

## 2014-12-02 MED ORDER — DOCUSATE SODIUM 100 MG PO CAPS
100.0000 mg | ORAL_CAPSULE | Freq: Every day | ORAL | Status: DC
  Administered 2014-12-02 – 2014-12-04 (×3): 100 mg via ORAL
  Filled 2014-12-02 (×3): qty 1

## 2014-12-02 MED ORDER — ACETAMINOPHEN 650 MG RE SUPP: 650.0000 mg | Freq: Four times a day (QID) | RECTAL | Status: DC | PRN

## 2014-12-02 MED ORDER — ACETAMINOPHEN 325 MG PO TABS: 650.0000 mg | ORAL_TABLET | Freq: Four times a day (QID) | ORAL | Status: DC | PRN

## 2014-12-02 MED ORDER — VITAMIN C 500 MG PO TABS
500.0000 mg | ORAL_TABLET | Freq: Two times a day (BID) | ORAL | Status: DC
Start: 2014-12-02 — End: 2014-12-05
  Administered 2014-12-02 – 2014-12-05 (×6): 500 mg via ORAL
  Filled 2014-12-02 (×6): qty 1

## 2014-12-02 MED ORDER — RISAQUAD PO CAPS
1.0000 | ORAL_CAPSULE | Freq: Every day | ORAL | Status: DC
  Administered 2014-12-02 – 2014-12-04 (×3): 1 via ORAL
  Filled 2014-12-02 (×3): qty 1

## 2014-12-02 MED ORDER — METOPROLOL TARTRATE 25 MG PO TABS
25.0000 mg | ORAL_TABLET | Freq: Two times a day (BID) | ORAL | Status: DC
  Administered 2014-12-02 – 2014-12-05 (×6): 25 mg via ORAL
  Filled 2014-12-02 (×6): qty 1

## 2014-12-02 MED ORDER — ATORVASTATIN CALCIUM 20 MG PO TABS
80.0000 mg | ORAL_TABLET | Freq: Every day | ORAL | Status: DC
  Administered 2014-12-02 – 2014-12-05 (×4): 80 mg via ORAL
  Filled 2014-12-02 (×4): qty 4

## 2014-12-02 MED ORDER — ASPIRIN 81 MG PO CHEW
324.0000 mg | CHEWABLE_TABLET | Freq: Once | ORAL | Status: AC
  Administered 2014-12-02: 324 mg via ORAL
  Filled 2014-12-02: qty 4

## 2014-12-02 MED ORDER — ONDANSETRON HCL 4 MG PO TABS: 4.0000 mg | ORAL_TABLET | Freq: Four times a day (QID) | ORAL | Status: DC | PRN

## 2014-12-02 MED ORDER — HYDRALAZINE HCL 25 MG PO TABS
25.0000 mg | ORAL_TABLET | Freq: Three times a day (TID) | ORAL | Status: DC
  Administered 2014-12-02 – 2014-12-05 (×7): 25 mg via ORAL
  Filled 2014-12-02 (×8): qty 1

## 2014-12-02 MED ORDER — CLOPIDOGREL BISULFATE 75 MG PO TABS
75.0000 mg | ORAL_TABLET | Freq: Every day | ORAL | Status: DC
  Administered 2014-12-03 – 2014-12-05 (×3): 75 mg via ORAL
  Filled 2014-12-02 (×3): qty 1

## 2014-12-02 MED ORDER — ISOSORBIDE MONONITRATE ER 30 MG PO TB24
30.0000 mg | ORAL_TABLET | Freq: Every day | ORAL | Status: DC
  Administered 2014-12-03 – 2014-12-05 (×3): 30 mg via ORAL
  Filled 2014-12-02 (×3): qty 1

## 2014-12-02 MED ORDER — HEPARIN SODIUM (PORCINE) 5000 UNIT/ML IJ SOLN
5000.0000 [IU] | Freq: Three times a day (TID) | INTRAMUSCULAR | Status: DC
  Administered 2014-12-02 – 2014-12-05 (×7): 5000 [IU] via SUBCUTANEOUS
  Filled 2014-12-02 (×8): qty 1

## 2014-12-02 MED ORDER — PROMETHAZINE HCL 25 MG PO TABS
25.0000 mg | ORAL_TABLET | Freq: Four times a day (QID) | ORAL | Status: DC | PRN
  Filled 2014-12-02: qty 1

## 2014-12-02 MED ORDER — LISINOPRIL 20 MG PO TABS
20.0000 mg | ORAL_TABLET | Freq: Every day | ORAL | Status: DC
  Administered 2014-12-03 – 2014-12-05 (×2): 20 mg via ORAL
  Filled 2014-12-02 (×2): qty 1

## 2014-12-02 MED ORDER — FAMOTIDINE 20 MG PO TABS
20.0000 mg | ORAL_TABLET | Freq: Every day | ORAL | Status: DC
  Administered 2014-12-03 – 2014-12-05 (×3): 20 mg via ORAL
  Filled 2014-12-02 (×3): qty 1

## 2014-12-02 MED ORDER — SODIUM CHLORIDE 0.9 % IV SOLN
100.0000 mg | Freq: Two times a day (BID) | INTRAVENOUS | Status: DC
  Administered 2014-12-03 – 2014-12-04 (×4): 100 mg via INTRAVENOUS
  Filled 2014-12-02 (×8): qty 10

## 2014-12-02 NOTE — ED Notes (Signed)
Pt and family request that pt be left on bedpan and attempt to urinate prior to using a foley on pt.

## 2014-12-02 NOTE — ED Notes (Signed)
Ems from dialysis center for possible seizure. No hx of seizure. Dialysis MWF, no treatment today.

## 2014-12-02 NOTE — H&P (Signed)
[facility] [place] [facility] [place]   PATIENT NAME: [name]    MR#:  [id number]  DATE OF BIRTH:  [birth year]  DATE OF ADMISSION:  12/02/2014  PRIMARY CARE PHYSICIAN: Fidel Levy, MD   REQUESTING/REFERRING PHYSICIAN: Dr. Chari Manning  CHIEF COMPLAINT:   Chief Complaint  Patient presents with  . Seizures    HISTORY OF PRESENT ILLNESS:  [name]  is a 69 y.o. female with a known history of end-stage renal disease on hemodialysis, history of previous CVA, hypertension, hyperlipidemia, diabetes, secondary hyperparathyroidism, chronic systolic CHF, GERD, who presented to the hospital due to witnessed seizures at the dialysis center today. As per the daughter who gives most of the history she weakness patient having a seizure at the dialysis center. The daughter describes a seizure as an episode when her legs started to give way and she sat down and then her arm started to shake. The patient was somewhat responsive during the episode but shortly after the actual episodes she was lethargic and confused. She had another similar episode shortly thereafter the first one and sent to the ER for further evaluation. Patient CT scan of the head was negative and the ER physician spoke to a neurologist who requested admission and further workup of her seizures. Patient presently denies any headache, numbness, tingling or any other associated symptoms presently.  PAST MEDICAL HISTORY:   Past Medical History  Diagnosis Date  . Stroke   . MI (myocardial infarction)   . Hearing impaired   . ESRD (end stage renal disease)   . Hyperlipidemia   . Hypertension   . Diabetes mellitus without complication   . Depression   . Coronary artery disease   . Dementia   . Secondary hyperparathyroidism   . Dialysis patient     Monday, Wednesday and Friday.   . Chronic systolic CHF (congestive heart failure)   . GERD (gastroesophageal reflux disease)   . Personal history of  transient ischemic attack (TIA) and cerebral infarction without residual deficit   . Anemia     PAST SURGICAL HISTORY:   Past Surgical History  Procedure Laterality Date  . Cataract extraction, bilateral    . Wrist surgery    . Peripheral vascular catheterization Left 07/28/2014    Procedure: A/V Shuntogram/Fistulagram;  Surgeon: Renford Dills, MD;  Location: [facility] INVASIVE CV LAB;  Service: Cardiovascular;  Laterality: Left;  . Peripheral vascular catheterization Left 07/28/2014    Procedure: A/V Shunt Intervention;  Surgeon: Renford Dills, MD;  Location: [facility] INVASIVE CV LAB;  Service: Cardiovascular;  Laterality: Left;    SOCIAL HISTORY:   Social History  Substance Use Topics  . Smoking status: Never Smoker   . Smokeless tobacco: Not on file  . Alcohol Use: No    FAMILY HISTORY:   Family History  Problem Relation Age of Onset  . Family history unknown: Yes    DRUG ALLERGIES:  No Known Allergies  REVIEW OF SYSTEMS:   Review of Systems  Constitutional: Negative for fever and weight loss.  HENT: Negative for congestion, nosebleeds and tinnitus.   Eyes: Negative for blurred vision, double vision and redness.  Respiratory: Negative for cough, hemoptysis and shortness of breath.   Cardiovascular: Negative for chest pain, orthopnea, leg swelling and PND.  Gastrointestinal: Negative for nausea, vomiting, abdominal pain, diarrhea and melena.  Genitourinary: Negative for dysuria, urgency and hematuria.  Musculoskeletal: Negative for joint pain and falls.  Neurological: Positive for seizures. Negative for  dizziness, tingling, sensory change, focal weakness, weakness and headaches.  Endo/Heme/Allergies: Negative for polydipsia. Does not bruise/bleed easily.  Psychiatric/Behavioral: Negative for depression and memory loss. The patient is not nervous/anxious.     MEDICATIONS AT HOME:   Prior to Admission medications   Medication Sig Start Date End Date Taking?  Authorizing Provider  aspirin EC 81 MG tablet Take 81 mg by mouth daily.   Yes Historical Provider, MD  atorvastatin (LIPITOR) 80 MG tablet Take 1 tablet (80 mg total) by mouth daily. 08/11/14  Yes Antonieta Iba, MD  calcium acetate (PHOSLO) 667 MG capsule Take 1,334 mg by mouth 3 (three) times daily with meals.    Yes Historical Provider, MD  clopidogrel (PLAVIX) 75 MG tablet Take 1 tablet (75 mg total) by mouth daily. 09/07/14  Yes Antonieta Iba, MD  docusate sodium (COLACE) 100 MG capsule Take 100 mg by mouth at bedtime.    Yes Historical Provider, MD  hydrALAZINE (APRESOLINE) 25 MG tablet Take 1 tablet (25 mg total) by mouth 3 (three) times daily. Patient taking differently: Take 25 mg by mouth 2 (two) times daily.  07/17/14  Yes Antonieta Iba, MD  isosorbide mononitrate (IMDUR) 30 MG 24 hr tablet Take 1 tablet (30 mg total) by mouth daily. 08/11/14  Yes Antonieta Iba, MD  lactobacillus acidophilus (BACID) TABS tablet Take 1 tablet by mouth at bedtime.   Yes Historical Provider, MD  lisinopril (PRINIVIL,ZESTRIL) 20 MG tablet Take 1 tablet (20 mg total) by mouth daily. 07/16/14  Yes Antonieta Iba, MD  metoprolol tartrate (LOPRESSOR) 25 MG tablet Take 25 mg by mouth 2 (two) times daily.   Yes Historical Provider, MD  Omega-3 Fatty Acids (FISH OIL) 500 MG CAPS Take 500 mg by mouth 2 (two) times daily.   Yes Historical Provider, MD  promethazine (PHENERGAN) 25 MG tablet Take 25 mg by mouth every 6 (six) hours as needed for nausea or vomiting.   Yes Historical Provider, MD  ranitidine (ZANTAC) 150 MG capsule Take 1 capsule (150 mg total) by mouth every evening. Patient taking differently: Take 150 mg by mouth daily.  10/02/14  Yes Amy Rusty Aus, NP  vitamin C (ASCORBIC ACID) 500 MG tablet Take 500 mg by mouth 2 (two) times daily.    Yes Historical Provider, MD  Vitamin D, Ergocalciferol, (DRISDOL) 50000 UNITS CAPS capsule Take 50,000 Units by mouth every 30 (thirty) days. Pt takes on the  7th of every month.   Yes Historical Provider, MD      VITAL SIGNS:  Blood pressure 130/69, pulse 68, temperature 98.2 F (36.8 C), temperature source Oral, SpO2 85 %.  PHYSICAL EXAMINATION:  Physical Exam  GENERAL:  69 y.o.-year-old patient lying in the bed with no acute distress.  EYES: Pupils equal, round, reactive to light and accommodation. No scleral icterus. Extraocular muscles intact.  HEENT: Head atraumatic, normocephalic. Oropharynx and nasopharynx clear. No oropharyngeal erythema, moist oral mucosa  NECK:  Supple, no jugular venous distention. No thyroid enlargement, no tenderness.  LUNGS: Normal breath sounds bilaterally, no wheezing, rales, rhonchi. No use of accessory muscles of respiration.  CARDIOVASCULAR: S1, S2 RRR. 2/6 systolic ejection murmur heard at the left sternal border, No rubs, gallops, clicks.  ABDOMEN: Soft, nontender, nondistended. Bowel sounds present. No organomegaly or mass.  EXTREMITIES: No pedal edema, cyanosis, or clubbing. + 2 pedal & radial pulses b/l.   NEUROLOGIC: Cranial nerves II through XII are intact. No focal Motor or sensory deficits appreciated b/l.  PSYCHIATRIC: The patient is alert and oriented x 1. Good affect.  SKIN: No obvious rash, lesion, or ulcer.   Left upper extremity AV fistula with good bruit and thrill  LABORATORY PANEL:   CBC  Recent Labs Lab 12/02/14 1428  WBC 4.1  HGB 10.2*  HCT 30.5*  PLT 203   ------------------------------------------------------------------------------------------------------------------  Chemistries   Recent Labs Lab 12/02/14 1428  NA 135  K 4.9  CL 91*  CO2 29  GLUCOSE 163*  BUN 55*  CREATININE 7.85*  CALCIUM 9.0  AST 36  ALT 30  ALKPHOS 115  BILITOT 0.7   ------------------------------------------------------------------------------------------------------------------  Cardiac Enzymes  Recent Labs Lab 12/02/14 1428  TROPONINI 0.39*    ------------------------------------------------------------------------------------------------------------------  RADIOLOGY:  Ct Head Wo Contrast  12/02/2014   CLINICAL DATA:  Possible seizure  EXAM: CT HEAD WITHOUT CONTRAST  TECHNIQUE: Contiguous axial images were obtained from the base of the skull through the vertex without intravenous contrast.  COMPARISON:  April 21, 2014  FINDINGS: There is no midline shift, hydrocephalus, or mass. No acute hemorrhage or acute transcortical infarct is identified. There is encephalomalacia/old infarct in the left parietal and parietal occipital lobe. Old infarct are identified in bilateral basal ganglia. The bony calvarium is intact. The visualized sinuses are clear. There is chronic diffuse atrophy. Chronic bilateral periventricular white matter small vessel ischemic changes identified.  IMPRESSION: No focal acute intracranial abnormality identified.  Old infarcts unchanged compared prior exam.  Chronic diffuse atrophy and chronic bilateral periventricular white matter small vessel ischemic change.   Electronically Signed   By: Sherian Rein M.D.   On: 12/02/2014 13:59   Dg Chest Portable 1 View  12/02/2014   CLINICAL DATA:  Seizure.  Renal failure.  EXAM: PORTABLE CHEST - 1 VIEW  COMPARISON:  December 10, 2012  FINDINGS: There is mild left midlung atelectatic change. No edema or consolidation. Heart is upper normal in size with pulmonary vascularity within normal limits. No adenopathy. There is atherosclerotic change in the aorta.  IMPRESSION: Mild left midlung atelectatic change. No edema or consolidation. No change in cardiac silhouette allowing for somewhat more shallow degree of inspiration at this time.   Electronically Signed   By: Bretta Bang III M.D.   On: 12/02/2014 14:46     IMPRESSION AND PLAN:   69 year old female with past medical history of end-stage renal disease on hemodialysis, hypertension, history of previous MI, history of  previous CVA, secondary hyperparathyroid, GERD, who presented to the hospital due to witnessed seizures.  #1 seizures-likely the cause of patient's shaking and altered mental status change. -CT head is negative on admission. The ER physician discussed with neurologist who recommended loading the patient with IV Vimpat. -I will continue maintenance dose of Vimpat. Order an EEG, get a neurology consult. Place patient on seizure precautions.  #2 end-stage renal disease on hemodialysis-patient did not get her dialysis today as she had seizures prior to being started. -I will consult nephrology. There is no urgent need for dialysis presently.  #3 hypertension-continue metoprolol, lisinopril, Hydralazine  #4 secondary hyperparathyroidism-continue PhosLo.  #5 history of previous CVA-continue Plavix, statin    All the records are reviewed and case discussed with ED provider. Management plans discussed with the patient, family and they are in agreement.  CODE STATUS: Full  TOTAL TIME TAKING CARE OF THIS PATIENT: 45 minutes.    Houston Siren M.D on 12/02/2014 at 3:53 PM  Between 7am to 6pm - Pager - [phone]  After 6pm go to [email or web address] -  password [email or web address] [facility]  [facility]  Office  [phone]  CC: Primary care physician; Dicky Doe, MD

## 2014-12-02 NOTE — ED Notes (Signed)
Pt continues to drink without difficulty. Pt is in no acute distress and is able to carry out a conversation.

## 2014-12-02 NOTE — Plan of Care (Signed)
Problem: Discharge Progression Outcomes Goal: Discharge plan in place and appropriate Individualization: Pt lives at home with daughter and her family. Pt uses 2xwheels walker with 1xassist. Dialysis on MWF. AV fistula in L forearm. Goal: Other Discharge Outcomes/Goals Outcome: Progressing Pt alert to self. Calm and cooperative. VSS. Vimpat initiated. No signs of pain nor discomfort.

## 2014-12-02 NOTE — ED Provider Notes (Addendum)
[facility] Emergency Department Provider Note  ____________________________________________  Time seen: Approximately 12:55 PM  I have reviewed the triage vital signs and the nursing notes.   HISTORY  Chief Complaint Seizures  History of present illness and review of systems limited second or to the patient's dementia. All information obtained from daughter at bedside.  HPI [name] is a 69 y.o. female history of end stage renal disease on dialysis, usually dialyzed Monday Wednesday Friday, dementia, coronary artery disease, diabetes, hypertension, hyperlipidemia presents for evaluation of seizure today. Daughter reports she brought her mother to dialysis today and mother had one episode of generalized tonic-clonic activity which resolved but was followed shortly thereafter by another seizure which was noted by nursing staff. She did not receive dialysis. Daughter reports that she has "looks different" since yesterday but denies any recent illness including no cough, vomiting, diarrhea, fevers or chills. Current severity of symptoms is moderate. She has had no seizures before. No modifying factors.   Past Medical History  Diagnosis Date  . Stroke   . MI (myocardial infarction)   . Hearing impaired   . ESRD (end stage renal disease)   . Hyperlipidemia   . Hypertension   . Diabetes mellitus without complication   . Depression   . Coronary artery disease   . Dementia   . Secondary hyperparathyroidism   . Dialysis patient     Monday, Wednesday and Friday.   . Chronic systolic CHF (congestive heart failure)   . GERD (gastroesophageal reflux disease)   . Personal history of transient ischemic attack (TIA) and cerebral infarction without residual deficit   . Anemia     Patient Active Problem List   Diagnosis Date Noted  . Hyperlipidemia 08/11/2014  . End stage renal disease on dialysis 05/12/2014  . Chronic systolic CHF (congestive heart failure)  05/12/2014  . Secondary hypertension, unspecified 05/12/2014  . Type 2 diabetes mellitus with other circulatory complications 05/12/2014  . Non-STEMI (non-ST elevated myocardial infarction) 05/12/2014  . Moderate aortic valve stenosis 05/12/2014    Past Surgical History  Procedure Laterality Date  . Cataract extraction, bilateral    . Wrist surgery    . Peripheral vascular catheterization Left 07/28/2014    Procedure: A/V Shuntogram/Fistulagram;  Surgeon: Renford Dills, MD;  Location: [facility] INVASIVE CV LAB;  Service: Cardiovascular;  Laterality: Left;  . Peripheral vascular catheterization Left 07/28/2014    Procedure: A/V Shunt Intervention;  Surgeon: Renford Dills, MD;  Location: [facility] INVASIVE CV LAB;  Service: Cardiovascular;  Laterality: Left;    Current Outpatient Rx  Name  Route  Sig  Dispense  Refill  . aspirin EC 81 MG tablet   Oral   Take 81 mg by mouth daily.         [name] atorvastatin (LIPITOR) 80 MG tablet   Oral   Take 1 tablet (80 mg total) by mouth daily.   90 tablet   3   . calcium acetate (PHOSLO) 667 MG capsule   Oral   Take 1,334 mg by mouth 3 (three) times daily with meals.          . clopidogrel (PLAVIX) 75 MG tablet   Oral   Take 1 tablet (75 mg total) by mouth daily.   30 tablet   3   . docusate sodium (COLACE) 100 MG capsule   Oral   Take 100 mg by mouth at bedtime.          . hydrALAZINE (APRESOLINE) 25  MG tablet   Oral   Take 1 tablet (25 mg total) by mouth 3 (three) times daily. Patient taking differently: Take 25 mg by mouth 2 (two) times daily.    90 tablet   3   . isosorbide mononitrate (IMDUR) 30 MG 24 hr tablet   Oral   Take 1 tablet (30 mg total) by mouth daily.   90 tablet   3   . lactobacillus acidophilus (BACID) TABS tablet   Oral   Take 1 tablet by mouth at bedtime.         [name] lisinopril (PRINIVIL,ZESTRIL) 20 MG tablet   Oral   Take 1 tablet (20 mg total) by mouth daily.   90 tablet   3   . metoprolol  tartrate (LOPRESSOR) 25 MG tablet   Oral   Take 25 mg by mouth 2 (two) times daily.         . Omega-3 Fatty Acids (FISH OIL) 500 MG CAPS   Oral   Take 500 mg by mouth 2 (two) times daily.         . promethazine (PHENERGAN) 25 MG tablet   Oral   Take 25 mg by mouth every 6 (six) hours as needed for nausea or vomiting.         . ranitidine (ZANTAC) 150 MG capsule   Oral   Take 1 capsule (150 mg total) by mouth every evening. Patient taking differently: Take 150 mg by mouth daily.    90 capsule   3   . vitamin C (ASCORBIC ACID) 500 MG tablet   Oral   Take 500 mg by mouth 2 (two) times daily.          . Vitamin D, Ergocalciferol, (DRISDOL) 50000 UNITS CAPS capsule   Oral   Take 50,000 Units by mouth every 30 (thirty) days. Pt takes on the 7th of every month.           Allergies Review of patient's allergies indicates no known allergies.  Family History  Problem Relation Age of Onset  . Family history unknown: Yes    Social History Social History  Substance Use Topics  . Smoking status: Never Smoker   . Smokeless tobacco: None  . Alcohol Use: No    Review of Systems  Constitutional: No fever/chills Cardiovascular: Denies chest pain. Respiratory: Denies shortness of breath. Gastrointestinal: No abdominal pain.  No nausea, no vomiting.  No diarrhea.  No constipation. Skin: Negative for rash. Neurological: Negative for headaches, + for chronic right-sided weakness.    History of present illness and review of systems limited second or to the patient's dementia. All information obtained from daughter at bedside. ____________________________________________   PHYSICAL EXAM: Filed Vitals:   12/02/14 1307  BP: 130/69  Pulse: 68  Temp: 98.2 F (36.8 C)  TempSrc: Oral  SpO2: 85%      Constitutional: Alert and oriented to self and place but not to eat year which is her baseline according to her daughter. She is chronically ill-appearing but in no  acute distress. Eyes: Conjunctivae are normal. PERRL. EOMI. Head: Atraumatic. Nose: No congestion/rhinnorhea. Mouth/Throat: Mucous membranes are moist.  Oropharynx non-erythematous. Neck: No stridor.  Cardiovascular: Normal rate, regular rhythm. + IV/VI systolic murmur. Good peripheral circulation. Respiratory: Normal respiratory effort.  No retractions. Diminished breath sounds bilateral bases. Gastrointestinal: Soft and nontender. No distention. No abdominal bruits. No CVA tenderness. Genitourinary: deferred Musculoskeletal: No lower extremity tenderness nor edema.  No joint effusions. Neurologic:  Normal speech  and language. Rightward tongue deviation. 3+ out of 5 strength in bilateral upper and lower extremities, sensation intact to light touch throughout. Skin:  Skin is warm, dry and intact. No rash noted. Psychiatric: Mood and affect are normal. Speech and behavior are normal.  ____________________________________________   LABS (all labs ordered are listed, but only abnormal results are displayed)  Labs Reviewed  CBC WITH DIFFERENTIAL/PLATELET - Abnormal; Notable for the following:    RBC 3.53 (*)    Hemoglobin 10.2 (*)    HCT 30.5 (*)    RDW 20.9 (*)    Lymphs Abs 0.6 (*)    All other components within normal limits  COMPREHENSIVE METABOLIC PANEL - Abnormal; Notable for the following:    Chloride 91 (*)    Glucose, Bld 163 (*)    BUN 55 (*)    Creatinine, Ser 7.85 (*)    GFR calc non Af Amer 5 (*)    GFR calc Af Amer 5 (*)    All other components within normal limits  TROPONIN I - Abnormal; Notable for the following:    Troponin I 0.39 (*)    All other components within normal limits  PROTIME-INR  APTT  URINALYSIS COMPLETEWITH MICROSCOPIC ([facility] ONLY)   ____________________________________________  EKG  ED ECG REPORT I, Gayla Doss, the attending physician, personally viewed and interpreted this ECG.   Date: 12/02/2014  EKG Time: 14:06  Rate: 65   Rhythm: normal sinus rhythm  Axis:normal  Intervals:none  ST&T Change: Q waves in lead 3, aVF, V1, V2 3, V2 similar to prior EKG.  ____________________________________________  RADIOLOGY  Chest x-ray  IMPRESSION: Mild left midlung atelectatic change. No edema or consolidation. No change in cardiac silhouette allowing for somewhat more shallow degree of inspiration at this time.  CT head FINDINGS: There is no midline shift, hydrocephalus, or mass. No acute hemorrhage or acute transcortical infarct is identified. There is encephalomalacia/old infarct in the left parietal and parietal occipital lobe. Old infarct are identified in bilateral basal ganglia. The bony calvarium is intact. The visualized sinuses are clear. There is chronic diffuse atrophy. Chronic bilateral periventricular white matter small vessel ischemic changes identified.  IMPRESSION: No focal acute intracranial abnormality identified.  Old infarcts unchanged compared prior exam.  Chronic diffuse atrophy and chronic bilateral periventricular white matter small vessel ischemic change. ____________________________________________   PROCEDURES  Procedure(s) performed: None  Critical Care performed: No  ____________________________________________   INITIAL IMPRESSION / ASSESSMENT AND PLAN / ED COURSE  Pertinent labs & imaging results that were available during my care of the patient were reviewed by me and considered in my medical decision making (see chart for details).  [name] is a 69 y.o. female history of end stage renal disease on dialysis, usually dialyzed Monday Wednesday Friday, dementia, coronary artery disease, diabetes, hypertension, hyperlipidemia presents for evaluation of new-onset seizures today. On arrival to the emergency department, she is at her baseline terms of mental status however she does have weakness in bilateral upper and lower extremities. According to her daughter at  bedside, previously her weakness was only right-sided however at this time she has 3 out of 5 strength in the left arm and leg which may represent postictal/Todd's paralysis or new acute CVA. She is not a candidate for TPA given seizure at time of symptom onset. Additionally, on arrival she is hypoxic with O2 saturation 85% which has improved with supplemental oxygen. She has no chronic home oxygen requirement. Plan for screening labs, chest x-ray,  CT head, urinalysis, anticipate likely admission.  ----------------------------------------- 3:45 PM on 12/02/2014 -----------------------------------------  Labs reviewed and are notable for mild stable chronic anemia. Elevated BUN and creatinine level as expected given her history, normal potassium. Troponin is elevated at 0.39, most recent troponin was 21 on chart review. It is unclear whether not this is chronic. We'll give aspirin. Normal coags. Chest x-ray with no acute cardio pulmonary abnormality. On arrival to the emergency department she was noted to be hypoxic with O2 saturation of 85% however she is now satting 95-100% on 1 L of oxygen. No acute indication for dialysis. CT head with no acute intracranial process, old infarcts are noted and unchanged from prior however given the patient has new left-sided weakness, is not back to her baseline in terms of neurological examination, I discussed the case with Dr. Mellody Drown who recommends loading with Vimpat. He agrees with admission. I discussed with Dr. Cherlynn Kaiser, hospitalist, for admission at this time. ____________________________________________   FINAL CLINICAL IMPRESSION(S) / ED DIAGNOSES  Final diagnoses:  Seizures      Gayla Doss, MD 12/02/14 1549  Gayla Doss, MD 12/02/14 1556

## 2014-12-03 ENCOUNTER — Encounter: Payer: Self-pay | Admitting: *Deleted

## 2014-12-03 ENCOUNTER — Inpatient Hospital Stay: Payer: Medicare Other

## 2014-12-03 LAB — BASIC METABOLIC PANEL
Anion gap: 15 (ref 5–15)
BUN: 70 mg/dL — AB (ref 6–20)
CHLORIDE: 90 mmol/L — AB (ref 101–111)
CO2: 30 mmol/L (ref 22–32)
CREATININE: 8.62 mg/dL — AB (ref 0.44–1.00)
Calcium: 8.9 mg/dL (ref 8.9–10.3)
GFR calc Af Amer: 5 mL/min — ABNORMAL LOW (ref >60)
GFR calc non Af Amer: 4 mL/min — ABNORMAL LOW (ref >60)
GLUCOSE: 156 mg/dL — AB (ref 65–99)
Potassium: 4 mmol/L (ref 3.5–5.1)
SODIUM: 135 mmol/L (ref 135–145)

## 2014-12-03 LAB — CBC
HEMATOCRIT: 30.9 % — AB (ref 35.0–47.0)
Hemoglobin: 10.1 g/dL — ABNORMAL LOW (ref 12.0–16.0)
MCH: 28.1 pg (ref 26.0–34.0)
MCHC: 32.7 g/dL (ref 32.0–36.0)
MCV: 85.9 fL (ref 80.0–100.0)
PLATELETS: 216 10*3/uL (ref 150–440)
RBC: 3.6 MIL/uL — ABNORMAL LOW (ref 3.80–5.20)
RDW: 20.3 % — AB (ref 11.5–14.5)
WBC: 3.6 10*3/uL (ref 3.6–11.0)

## 2014-12-03 LAB — TROPONIN I: Troponin I: 0.24 ng/mL — ABNORMAL HIGH (ref <0.031)

## 2014-12-03 MED ORDER — NEPRO/CARBSTEADY PO LIQD
237.0000 mL | Freq: Two times a day (BID) | ORAL | Status: DC
  Administered 2014-12-03 – 2014-12-04 (×3): 237 mL via ORAL

## 2014-12-03 NOTE — Progress Notes (Addendum)
[facility] [place] [facility]   PATIENT NAME: [name]    MR#:  [id number]  DATE OF BIRTH:  08-30-1945  SUBJECTIVE:  CHIEF COMPLAINT:   Chief Complaint  Patient presents with  . Seizures   the patient is demented and has no complaints, no active seizure after admission. Per her daughter, the patient has weakness.  REVIEW OF SYSTEMS:  CONSTITUTIONAL: No fever, fatigue or weakness.  EYES: No blurred or double vision.  EARS, NOSE, AND THROAT: No tinnitus or ear pain.  RESPIRATORY: No cough, shortness of breath, wheezing or hemoptysis.  CARDIOVASCULAR: No chest pain, orthopnea, edema.  GASTROINTESTINAL: No nausea, vomiting, diarrhea or abdominal pain.  GENITOURINARY: No dysuria, hematuria.  ENDOCRINE: No polyuria, nocturia,  HEMATOLOGY: No anemia, easy bruising or bleeding SKIN: No rash or lesion. MUSCULOSKELETAL: No joint pain or arthritis.   NEUROLOGIC: No tingling, numbness, weakness.  PSYCHIATRY: No anxiety or depression.   DRUG ALLERGIES:  No Known Allergies  VITALS:  Blood pressure 129/66, pulse 71, temperature 97.8 F (36.6 C), temperature source Oral, resp. rate 20, height 5' (1.524 m), weight 60.691 kg (133 lb 12.8 oz), SpO2 100 %.  PHYSICAL EXAMINATION:  GENERAL:  69 y.o.-year-old patient lying in the bed with no acute distress.  EYES: Pupils equal, round, reactive to light and accommodation. No scleral icterus. Extraocular muscles intact.  HEENT: Head atraumatic, normocephalic. Oropharynx and nasopharynx clear. Moist oral mucosa. NECK:  Supple, no jugular venous distention. No thyroid enlargement, no tenderness.  LUNGS: Normal breath sounds bilaterally, no wheezing, rales,rhonchi or crepitation. No use of accessory muscles of respiration.  CARDIOVASCULAR: S1, S2 normal. 2/6 systolic murmurs, no rubs, or gallops.  ABDOMEN: Soft, nontender, nondistended. Bowel sounds present. No organomegaly or mass.  EXTREMITIES: No pedal edema,  cyanosis, or clubbing.  NEUROLOGIC: Cranial nerves II through XII are intact. Muscle strength 5/5 in all extremities. Sensation intact. Gait not checked.  PSYCHIATRIC: The patient is alert and oriented x 2. Demented. SKIN: No obvious rash, lesion, or ulcer.    LABORATORY PANEL:   CBC  Recent Labs Lab 12/03/14 0619  WBC 3.6  HGB 10.1*  HCT 30.9*  PLT 216   ------------------------------------------------------------------------------------------------------------------  Chemistries   Recent Labs Lab 12/02/14 1428 12/03/14 0619  NA 135 135  K 4.9 4.0  CL 91* 90*  CO2 29 30  GLUCOSE 163* 156*  BUN 55* 70*  CREATININE 7.85* 8.62*  CALCIUM 9.0 8.9  AST 36  --   ALT 30  --   ALKPHOS 115  --   BILITOT 0.7  --    ------------------------------------------------------------------------------------------------------------------  Cardiac Enzymes  Recent Labs Lab 12/02/14 1428  TROPONINI 0.39*   ------------------------------------------------------------------------------------------------------------------  RADIOLOGY:  Ct Head Wo Contrast  12/02/2014   CLINICAL DATA:  Possible seizure  EXAM: CT HEAD WITHOUT CONTRAST  TECHNIQUE: Contiguous axial images were obtained from the base of the skull through the vertex without intravenous contrast.  COMPARISON:  April 21, 2014  FINDINGS: There is no midline shift, hydrocephalus, or mass. No acute hemorrhage or acute transcortical infarct is identified. There is encephalomalacia/old infarct in the left parietal and parietal occipital lobe. Old infarct are identified in bilateral basal ganglia. The bony calvarium is intact. The visualized sinuses are clear. There is chronic diffuse atrophy. Chronic bilateral periventricular white matter small vessel ischemic changes identified.  IMPRESSION: No focal acute intracranial abnormality identified.  Old infarcts unchanged compared prior exam.  Chronic diffuse atrophy and chronic bilateral  periventricular white matter small  vessel ischemic change.   Electronically Signed   By: Sherian Rein M.D.   On: 12/02/2014 13:59   Dg Chest Portable 1 View  12/02/2014   CLINICAL DATA:  Seizure.  Renal failure.  EXAM: PORTABLE CHEST - 1 VIEW  COMPARISON:  December 10, 2012  FINDINGS: There is mild left midlung atelectatic change. No edema or consolidation. Heart is upper normal in size with pulmonary vascularity within normal limits. No adenopathy. There is atherosclerotic change in the aorta.  IMPRESSION: Mild left midlung atelectatic change. No edema or consolidation. No change in cardiac silhouette allowing for somewhat more shallow degree of inspiration at this time.   Electronically Signed   By: Bretta Bang III M.D.   On: 12/02/2014 14:46    EKG:   Orders placed or performed during the hospital encounter of 12/02/14  . ED EKG  . ED EKG  . ED EKG  . ED EKG  . EKG 12-Lead  . EKG 12-Lead  . EKG 12-Lead  . EKG 12-Lead    ASSESSMENT AND PLAN:   #1 seizures. No seizure after admission. Continue Vimpat. Follow-up neurologist. on seizure precautions.  #2 end-stage renal disease on hemodialysis.  f/u nephrology for HD. There is no urgent need for dialysis presently.  #3 hypertension-continue metoprolol, lisinopril, Hydralazine  #4 secondary hyperparathyroidism-continue PhosLo.  #5 history of previous CVA-continue Plavix, statin  * Elevated troponin, due to demanding ischemia/ESRD.  f/u troponin, continue Aspirin, plavix and lipitor.  Weakness. PT.   All the records are reviewed and case discussed with Care Management/Social Workerr. Management plans discussed with the patient, her daughter and they are in agreement. Greater than 50% of time spent in coordination of care.   CODE STATUS: Full code  TOTAL TIME TAKING CARE OF THIS PATIENT: 38 minutes.   POSSIBLE D/C IN 2 DAYS, DEPENDING ON CLINICAL CONDITION.   Shaune Pollack M.D on 12/03/2014 at 1:32 PM  Between 7am  to 6pm - Pager - [phone]  After 6pm go to [email or web address] - password [email or web address] [facility]  [facility]  Office  [phone]  CC: Primary care physician; Fidel Levy, MD

## 2014-12-03 NOTE — Care Management (Signed)
Admitted to [facility] with the diagnosis of seizures. Lives with daughter, Randye Lobo, [phone]) for a long time.  Sees Dr. Juanetta Gosling. Unsure when last office visit was. Seen Dr. Mariah Milling in the past. States she has been going to dialysis for about 2 years. Transports per [facility]. Home Health in the past, long time ago. No skilled facility. No home oxygen. Uses a cane to aid in ambulation.  No falls. Good appetite. States she takes care of her basic activities of daily living herself, but daughter helps sometime.  Witnessed seizure activity during dialysis yesterday. EEG scheduled for this morning.  Gwenette Greet RN MSN Care Management [phone]

## 2014-12-03 NOTE — Progress Notes (Signed)
Initial Nutrition Assessment   INTERVENTION:   Meals and Snacks: Cater to patient preferences; CNA Sherika agreeable to aid pt at meal times Medical Food Supplement Therapy: will recommend Nepro Shake po BID, each supplement provides 425 kcal and 19 grams protein  NUTRITION DIAGNOSIS:   Increased nutrient needs related to chronic illness as evidenced by estimated needs.  GOAL:   Patient will meet greater than or equal to 90% of their needs  MONITOR:    (Energy Intake, Electrolyte and renal Profile, Digestive system, Anthropometrics, UOP)  REASON FOR ASSESSMENT:    (Dialysis Pt)    ASSESSMENT:   Pt admitted with weakness and seizures. Pt with h/o ESRD on HD.   Past Medical History  Diagnosis Date  . Stroke   . MI (myocardial infarction)   . Hearing impaired   . ESRD (end stage renal disease)   . Hyperlipidemia   . Hypertension   . Diabetes mellitus without complication   . Depression   . Coronary artery disease   . Dementia   . Secondary hyperparathyroidism   . Dialysis patient     Monday, Wednesday and Friday.   . Chronic systolic CHF (congestive heart failure)   . GERD (gastroesophageal reflux disease)   . Personal history of transient ischemic attack (TIA) and cerebral infarction without residual deficit   . Anemia      Diet Order:  Diet renal/carb modified with fluid restriction Diet-HS Snack?: Nothing; Room service appropriate?: Yes; Fluid consistency:: Thin    Current Nutrition: Pt reports not having breakfast yet this am. Pt asking for tray, CNA ordered another breakfast tray.   Food/Nutrition-Related History: Pt unable to clarify usual po intake or appetite, but did reply 'yes' when asked if she normally ate 3 meals per day. Pt reports not knowing if she drinks supplement or Nepros PTA. Pt does report she needs help eating.    Medications: Phoslo, Colace, vitamin C, omega-3, vitamin D  Electrolyte/Renal Profile and Glucose Profile:   Recent  Labs Lab 12/02/14 1428 12/03/14 0619  NA 135 135  K 4.9 4.0  CL 91* 90*  CO2 29 30  BUN 55* 70*  CREATININE 7.85* 8.62*  CALCIUM 9.0 8.9  GLUCOSE 163* 156*   Protein Profile:  Recent Labs Lab 12/02/14 1428  ALBUMIN 3.7    Gastrointestinal Profile: Last BM:  12/01/2014   Nutrition-Focused Physical Exam Findings: Nutrition-Focused physical exam completed. Findings are WDL for fat depletion, muscle depletion, and edema for upper body. RD unable to finish lower extremity exam as pt did not want to be uncovered.    Weight Change: pt does not know her weight or usual dry weight on visit. Per [facility] weight stable for the past 4 months.   Skin:  Reviewed, no issues   Height:   Ht Readings from Last 1 Encounters:  12/02/14 5' (1.524 m)    Weight:   Wt Readings from Last 1 Encounters:  12/02/14 133 lb 12.8 oz (60.691 kg)   Wt Readings from Last 10 Encounters:  12/02/14 133 lb 12.8 oz (60.691 kg)  08/11/14 133 lb (60.328 kg)  07/28/14 123 lb (55.792 kg)  05/12/14 128 lb (58.06 kg)    BMI:  Body mass index is 26.13 kg/(m^2).  Estimated Nutritional Needs:   Kcal:  BEE: 1057kcals, TEE: (IF 1.2-1.4)(AF 1.2) 1523-1776kcals  Protein:  73-90g protein (1.2-1.5g/kg)  Fluid:  UOP+1L  EDUCATION NEEDS:   Education needs no appropriate at this time  MODERATE Care Level  Leda Quail, RD,  LDN Pager [phone]

## 2014-12-03 NOTE — Plan of Care (Signed)
Problem: Discharge Progression Outcomes Goal: Other Discharge Outcomes/Goals Outcome: Progressing Plan of care progress to goal for: 1. Discharge Plan:         In Place and Appropriate.         Refused PT Evaluation. 2. Pain:         Denies pain. 3. Hemodynamically Stable:         VSS.         Afebrile.         Dialysis M-W-F.  AV Fistula LFA.          4. Complications:         No signs/symptoms of complications noted. 5. Diet:         Renal Carb Modified.         Increased Appetite.         Feeding Supplement Nepro ordered.  6. Activity:         Bedrest.         Two Person Assist.

## 2014-12-03 NOTE — Progress Notes (Signed)
PT Cancellation Note  Patient Details Name: [name] MRN: [id number] DOB: [birth year]   Cancelled Treatment:    Reason Eval/Treat Not Completed: Other (comment) (See PT note for further details ) PT attempted evaluation, however pt was too lethargic to attend to therapy questions or tasks. Pt partially oriented. Will attempt at later time/date when more appropriate.    Benna Dunks 12/03/2014, 4:11 PM  Benna Dunks, SPT. [phone]

## 2014-12-03 NOTE — Consult Note (Signed)
Reason for Consult: seizure Referring Physician: Dr. Bridgett Larsson  [name] is an 69 y.o. female.  HPI:  Seen at request of Dr. Bridgett Larsson for seizure;  69 yo RHD F presents to [facility] from dialysis from a seizure.  Pt does not remember the episode and none of the witnesses are at bedside.  There was no incontinence or tongue biting seen.  Pt does not talk much but does state that she is ok.  Her baseline is unclear to me as there is mention of dementia hx.  Past Medical History  Diagnosis Date  . Stroke   . MI (myocardial infarction)   . Hearing impaired   . ESRD (end stage renal disease)   . Hyperlipidemia   . Hypertension   . Diabetes mellitus without complication   . Depression   . Coronary artery disease   . Dementia   . Secondary hyperparathyroidism   . Dialysis patient     Monday, Wednesday and Friday.   . Chronic systolic CHF (congestive heart failure)   . GERD (gastroesophageal reflux disease)   . Personal history of transient ischemic attack (TIA) and cerebral infarction without residual deficit   . Anemia     Past Surgical History  Procedure Laterality Date  . Cataract extraction, bilateral    . Wrist surgery    . Peripheral vascular catheterization Left 07/28/2014    Procedure: A/V Shuntogram/Fistulagram;  Surgeon: Katha Cabal, MD;  Location: [facility];  Service: Cardiovascular;  Laterality: Left;  . Peripheral vascular catheterization Left 07/28/2014    Procedure: A/V Shunt Intervention;  Surgeon: Katha Cabal, MD;  Location: [facility];  Service: Cardiovascular;  Laterality: Left;    Family History  Problem Relation Age of Onset  . Family history unknown: Yes    Social History:  reports that she has never smoked. She does not have any smokeless tobacco history on file. She reports that she does not drink alcohol or use illicit drugs.  Allergies: No Known Allergies  Medications: personally reviewed by me as per chart  Results for orders  placed or performed during the hospital encounter of 12/02/14 (from the past 48 hour(s))  CBC with Differential     Status: Abnormal   Collection Time: 12/02/14  2:28 PM  Result Value Ref Range   WBC 4.1 3.6 - 11.0 K/uL   RBC 3.53 (L) 3.80 - 5.20 MIL/uL   Hemoglobin 10.2 (L) 12.0 - 16.0 g/dL   HCT 30.5 (L) 35.0 - 47.0 %   MCV 86.3 80.0 - 100.0 fL   MCH 28.7 26.0 - 34.0 pg   MCHC 33.3 32.0 - 36.0 g/dL   RDW 20.9 (H) 11.5 - 14.5 %   Platelets 203 150 - 440 K/uL   Neutrophils Relative % 71 %   Neutro Abs 2.9 1.4 - 6.5 K/uL   Lymphocytes Relative 14 %   Lymphs Abs 0.6 (L) 1.0 - 3.6 K/uL   Monocytes Relative 11 %   Monocytes Absolute 0.5 0.2 - 0.9 K/uL   Eosinophils Relative 2 %   Eosinophils Absolute 0.1 0 - 0.7 K/uL   Basophils Relative 2 %   Basophils Absolute 0.1 0 - 0.1 K/uL  Comprehensive metabolic panel     Status: Abnormal   Collection Time: 12/02/14  2:28 PM  Result Value Ref Range   Sodium 135 135 - 145 mmol/L   Potassium 4.9 3.5 - 5.1 mmol/L   Chloride 91 (L) 101 - 111 mmol/L  CO2 29 22 - 32 mmol/L   Glucose, Bld 163 (H) 65 - 99 mg/dL   BUN 55 (H) 6 - 20 mg/dL   Creatinine, Ser 7.85 (H) 0.44 - 1.00 mg/dL   Calcium 9.0 8.9 - 10.3 mg/dL   Total Protein 7.2 6.5 - 8.1 g/dL   Albumin 3.7 3.5 - 5.0 g/dL   AST 36 15 - 41 U/L   ALT 30 14 - 54 U/L   Alkaline Phosphatase 115 38 - 126 U/L   Total Bilirubin 0.7 0.3 - 1.2 mg/dL   GFR calc non Af Amer 5 (L) >60 mL/min   GFR calc Af Amer 5 (L) >60 mL/min    Comment: (NOTE) The eGFR has been calculated using the CKD EPI equation. This calculation has not been validated in all clinical situations. eGFR's persistently <60 mL/min signify possible Chronic Kidney Disease.    Anion gap 15 5 - 15  Troponin I     Status: Abnormal   Collection Time: 12/02/14  2:28 PM  Result Value Ref Range   Troponin I 0.39 (H) <0.031 ng/mL    Comment: READ BACK AND VERIFIED WITH;  SHANNON MARTIN AT 1509 9 14/16 SDR        PERSISTENTLY  INCREASED TROPONIN VALUES IN THE RANGE OF 0.04-0.49 ng/mL CAN BE SEEN IN:       -UNSTABLE ANGINA       -CONGESTIVE HEART FAILURE       -MYOCARDITIS       -CHEST TRAUMA       -ARRYHTHMIAS       -LATE PRESENTING MYOCARDIAL INFARCTION       -COPD   CLINICAL FOLLOW-UP RECOMMENDED.   Protime-INR     Status: None   Collection Time: 12/02/14  2:28 PM  Result Value Ref Range   Prothrombin Time 14.7 11.4 - 15.0 seconds   INR 1.13   APTT     Status: None   Collection Time: 12/02/14  2:28 PM  Result Value Ref Range   aPTT 34 24 - 36 seconds  Urinalysis complete, with microscopic ([facility] only)     Status: Abnormal   Collection Time: 12/02/14  5:21 PM  Result Value Ref Range   Color, Urine YELLOW (A) YELLOW   APPearance CLEAR (A) CLEAR   Glucose, UA 50 (A) NEGATIVE mg/dL   Bilirubin Urine NEGATIVE NEGATIVE   Ketones, ur NEGATIVE NEGATIVE mg/dL   Specific Gravity, Urine 1.016 1.005 - 1.030   Hgb urine dipstick NEGATIVE NEGATIVE   pH 7.0 5.0 - 8.0   Protein, ur >500 (A) NEGATIVE mg/dL   Nitrite NEGATIVE NEGATIVE   Leukocytes, UA NEGATIVE NEGATIVE   RBC / HPF 0-5 0 - 5 RBC/hpf   WBC, UA 0-5 0 - 5 WBC/hpf   Bacteria, UA NONE SEEN NONE SEEN   Squamous Epithelial / LPF 0-5 (A) NONE SEEN  MRSA PCR Screening     Status: None   Collection Time: 12/02/14  9:45 PM  Result Value Ref Range   MRSA by PCR NEGATIVE NEGATIVE    Comment:        The GeneXpert MRSA Assay (FDA approved for NASAL specimens only), is one component of a comprehensive MRSA colonization surveillance program. It is not intended to diagnose MRSA infection nor to guide or monitor treatment for MRSA infections.   Basic metabolic panel     Status: Abnormal   Collection Time: 12/03/14  6:19 AM  Result Value Ref Range   Sodium 135 135 -  145 mmol/L   Potassium 4.0 3.5 - 5.1 mmol/L   Chloride 90 (L) 101 - 111 mmol/L   CO2 30 22 - 32 mmol/L   Glucose, Bld 156 (H) 65 - 99 mg/dL   BUN 70 (H) 6 - 20 mg/dL   Creatinine, Ser  8.62 (H) 0.44 - 1.00 mg/dL   Calcium 8.9 8.9 - 10.3 mg/dL   GFR calc non Af Amer 4 (L) >60 mL/min   GFR calc Af Amer 5 (L) >60 mL/min    Comment: (NOTE) The eGFR has been calculated using the CKD EPI equation. This calculation has not been validated in all clinical situations. eGFR's persistently <60 mL/min signify possible Chronic Kidney Disease.    Anion gap 15 5 - 15  CBC     Status: Abnormal   Collection Time: 12/03/14  6:19 AM  Result Value Ref Range   WBC 3.6 3.6 - 11.0 K/uL   RBC 3.60 (L) 3.80 - 5.20 MIL/uL   Hemoglobin 10.1 (L) 12.0 - 16.0 g/dL   HCT 30.9 (L) 35.0 - 47.0 %   MCV 85.9 80.0 - 100.0 fL   MCH 28.1 26.0 - 34.0 pg   MCHC 32.7 32.0 - 36.0 g/dL   RDW 20.3 (H) 11.5 - 14.5 %   Platelets 216 150 - 440 K/uL  Troponin I     Status: Abnormal   Collection Time: 12/03/14  2:48 PM  Result Value Ref Range   Troponin I 0.24 (H) <0.031 ng/mL    Comment: RESULTS PREVIOUSLY CALLED BY SDR AT 1509 12/02/14.Marland KitchenMarland KitchenSMG        PERSISTENTLY INCREASED TROPONIN VALUES IN THE RANGE OF 0.04-0.49 ng/mL CAN BE SEEN IN:       -UNSTABLE ANGINA       -CONGESTIVE HEART FAILURE       -MYOCARDITIS       -CHEST TRAUMA       -ARRYHTHMIAS       -LATE PRESENTING MYOCARDIAL INFARCTION       -COPD   CLINICAL FOLLOW-UP RECOMMENDED.     Ct Head Wo Contrast  12/02/2014   CLINICAL DATA:  Possible seizure  EXAM: CT HEAD WITHOUT CONTRAST  TECHNIQUE: Contiguous axial images were obtained from the base of the skull through the vertex without intravenous contrast.  COMPARISON:  April 21, 2014  FINDINGS: There is no midline shift, hydrocephalus, or mass. No acute hemorrhage or acute transcortical infarct is identified. There is encephalomalacia/old infarct in the left parietal and parietal occipital lobe. Old infarct are identified in bilateral basal ganglia. The bony calvarium is intact. The visualized sinuses are clear. There is chronic diffuse atrophy. Chronic bilateral periventricular white matter  small vessel ischemic changes identified.  IMPRESSION: No focal acute intracranial abnormality identified.  Old infarcts unchanged compared prior exam.  Chronic diffuse atrophy and chronic bilateral periventricular white matter small vessel ischemic change.   Electronically Signed   By: Abelardo Diesel M.D.   On: 12/02/2014 13:59   Dg Chest Portable 1 View  12/02/2014   CLINICAL DATA:  Seizure.  Renal failure.  EXAM: PORTABLE CHEST - 1 VIEW  COMPARISON:  December 10, 2012  FINDINGS: There is mild left midlung atelectatic change. No edema or consolidation. Heart is upper normal in size with pulmonary vascularity within normal limits. No adenopathy. There is atherosclerotic change in the aorta.  IMPRESSION: Mild left midlung atelectatic change. No edema or consolidation. No change in cardiac silhouette allowing for somewhat more shallow degree of inspiration at this time.  Electronically Signed   By: Lowella Grip III M.D.   On: 12/02/2014 14:46    Review of Systems  Unable to perform ROS: medical condition   Blood pressure 129/66, pulse 71, temperature 97.8 F (36.6 C), temperature source Oral, resp. rate 20, height 5' (1.524 m), weight 60.691 kg (133 lb 12.8 oz), SpO2 100 %. Physical Exam  Nursing note and vitals reviewed. Constitutional: She appears well-developed. No distress.  HENT:  Head: Normocephalic and atraumatic.  Right Ear: External ear normal.  Left Ear: External ear normal.  Nose: Nose normal.  Mouth/Throat: Oropharynx is clear and moist.  Eyes: Conjunctivae and EOM are normal. Pupils are equal, round, and reactive to light. No scleral icterus.  Neck: Normal range of motion. Neck supple. No JVD present.  Cardiovascular: Normal rate, regular rhythm and intact distal pulses.  Exam reveals no gallop and no friction rub.   Murmur heard. Respiratory: Effort normal and breath sounds normal. No respiratory distress.  GI: Soft. Bowel sounds are normal. She exhibits no distension.   Musculoskeletal: Normal range of motion. She exhibits no edema.  Neurological:  Alert but oriented only to person, follows most commands but does have problems naming, mild dysarthria PERRLA, EOMI, nl VF to threat, face symmetric, tongue midline 5-/5 B, nl tone FTN WNL 1+/4 B, mute plantars B Intact to light touch B, no obvious neglect  Skin: She is not diaphoretic.   CT of head personally reviewed by me and shows an old L occipital infarct with severe white matter changes and moderate atrophy  Assessment/Plan: 1.  Seizure-  This was seen during dialysis and could be dysequilibrium syndrome as well.  However, due to old stroke, a seizure is a more plausible cause. 2.  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  Dementia-  Unsure of pts baseline at this point -  Continue Vimpat 123m q12h -  Continue dialysis -  Will try to find baseline with family members -  Continue ASA, plavix and lipitor -  Keep Mg > 2, Ca > 8 and Na > 130 -  No driving or operating heavy machinery x 6 months -  Will follow briefly  Gwenivere Hiraldo 12/03/2014, 4:48 PM

## 2014-12-03 NOTE — Progress Notes (Addendum)
Subjective:   Patient known to our practice from previous admissions She has dementia and is not able to provide any meaningful history She is not sure why she is in the hospital Denies shortness of breath  Objective:  Vital signs in last 24 hours:  Temp:  [97.8 F (36.6 C)-98.2 F (36.8 C)] 97.8 F (36.6 C) (09/15 0440) Pulse Rate:  [66-75] 66 (09/15 0440) Resp:  [13-18] 18 (09/15 0440) BP: (130-171)/(69-90) 158/79 mmHg (09/15 0440) SpO2:  [85 %-100 %] 96 % (09/15 0440) Weight:  [60.691 kg (133 lb 12.8 oz)] 60.691 kg (133 lb 12.8 oz) (09/14 1743)  Weight change:  Filed Weights   12/02/14 1743  Weight: 60.691 kg (133 lb 12.8 oz)    Intake/Output:   No intake or output data in the 24 hours ending 12/03/14 1131   Physical Exam: General:  laying in the bed, no acute distress   HEENT  anicteric   Neck  supple   Pulm/lungs  clear bilaterally, normal effort   CVS/Heart  regular, no rub   Abdomen:   soft, nontender   Extremities:  no peripheral edema   Neurologic:  alert, not oriented, answers questions   Skin:  no acute rashes   Access:  AV graft        Basic Metabolic Panel:   Recent Labs Lab 12/02/14 1428 12/03/14 0619  NA 135 135  K 4.9 4.0  CL 91* 90*  CO2 29 30  GLUCOSE 163* 156*  BUN 55* 70*  CREATININE 7.85* 8.62*  CALCIUM 9.0 8.9     CBC:  Recent Labs Lab 12/02/14 1428 12/03/14 0619  WBC 4.1 3.6  NEUTROABS 2.9  --   HGB 10.2* 10.1*  HCT 30.5* 30.9*  MCV 86.3 85.9  PLT 203 216      Microbiology:  Recent Results (from the past 720 hour(s))  MRSA PCR Screening     Status: None   Collection Time: 12/02/14  9:45 PM  Result Value Ref Range Status   MRSA by PCR NEGATIVE NEGATIVE Final    Comment:        The GeneXpert MRSA Assay (FDA approved for NASAL specimens only), is one component of a comprehensive MRSA colonization surveillance program. It is not intended to diagnose MRSA infection nor to guide or monitor treatment  for MRSA infections.     Coagulation Studies:  Recent Labs  12/02/14 1428  LABPROT 14.7  INR 1.13    Urinalysis:  Recent Labs  12/02/14 1721  COLORURINE YELLOW*  LABSPEC 1.016  PHURINE 7.0  GLUCOSEU 50*  HGBUR NEGATIVE  BILIRUBINUR NEGATIVE  KETONESUR NEGATIVE  PROTEINUR >500*  NITRITE NEGATIVE  LEUKOCYTESUR NEGATIVE      Imaging: Ct Head Wo Contrast  12/02/2014   CLINICAL DATA:  Possible seizure  EXAM: CT HEAD WITHOUT CONTRAST  TECHNIQUE: Contiguous axial images were obtained from the base of the skull through the vertex without intravenous contrast.  COMPARISON:  April 21, 2014  FINDINGS: There is no midline shift, hydrocephalus, or mass. No acute hemorrhage or acute transcortical infarct is identified. There is encephalomalacia/old infarct in the left parietal and parietal occipital lobe. Old infarct are identified in bilateral basal ganglia. The bony calvarium is intact. The visualized sinuses are clear. There is chronic diffuse atrophy. Chronic bilateral periventricular white matter small vessel ischemic changes identified.  IMPRESSION: No focal acute intracranial abnormality identified.  Old infarcts unchanged compared prior exam.  Chronic diffuse atrophy and chronic bilateral periventricular white matter small vessel  ischemic change.   Electronically Signed   By: Sherian Rein M.D.   On: 12/02/2014 13:59   Dg Chest Portable 1 View  12/02/2014   CLINICAL DATA:  Seizure.  Renal failure.  EXAM: PORTABLE CHEST - 1 VIEW  COMPARISON:  December 10, 2012  FINDINGS: There is mild left midlung atelectatic change. No edema or consolidation. Heart is upper normal in size with pulmonary vascularity within normal limits. No adenopathy. There is atherosclerotic change in the aorta.  IMPRESSION: Mild left midlung atelectatic change. No edema or consolidation. No change in cardiac silhouette allowing for somewhat more shallow degree of inspiration at this time.   Electronically Signed    By: Bretta Bang III M.D.   On: 12/02/2014 14:46     Medications:     . acidophilus  1 capsule Oral QHS  . aspirin EC  81 mg Oral Daily  . atorvastatin  80 mg Oral Daily  . calcium acetate  1,334 mg Oral TID WC  . clopidogrel  75 mg Oral Daily  . docusate sodium  100 mg Oral QHS  . famotidine  20 mg Oral Daily  . feeding supplement (NEPRO CARB STEADY)  237 mL Oral BID BM  . heparin  5,000 Units Subcutaneous 3 times per day  . hydrALAZINE  25 mg Oral TID  . isosorbide mononitrate  30 mg Oral Daily  . lacosamide (VIMPAT) IV  100 mg Intravenous Q12H  . lisinopril  20 mg Oral Daily  . metoprolol tartrate  25 mg Oral BID  . omega-3 acid ethyl esters  1 g Oral Daily  . vitamin C  500 mg Oral BID  . Vitamin D (Ergocalciferol)  50,000 Units Oral Q30 days   acetaminophen **OR** acetaminophen, ondansetron **OR** ondansetron (ZOFRAN) IV, promethazine  Assessment/ Plan:  69 y.o. African American female  female with past medical history of hypertension, diabetes mellitus, end-stage renal disease on hemodialysis Monday, Wednesday, and Friday followed at the [facility] by [facility] nephrology, hyperlipidemia, cerebrovascular accident of the left frontal lobe as well as acute nonhemorrhagic small left prior occipital lobe infarct at the periphery of an old infarct admitted from dialysis after an episode of seizure  [facility]/MWF  1.  End-stage renal disease on hemodialysis Monday, Wednesday and Friday   - will arrange for HD tomorrow  2.  Anemia of chronic kidney disease: hemoglobin 10.1 - low dose EPO with treatment  3.  Secondary hyperparathyroidism.  - monitor phos during admission -  low Phos diet     LOS: 1 [name] 9/15/201611:31 AM

## 2014-12-03 NOTE — Plan of Care (Signed)
Problem: Discharge Progression Outcomes Goal: Other Discharge Outcomes/Goals Outcome: Progressing Pt is alert to self, no c/o pain at this time. Negative for MRSA PCR. Takes pills crushed in apple sauce. Resting quietly, VSS.

## 2014-12-04 NOTE — Progress Notes (Signed)
Subjective:   Patient seen during dialysis Tolerating well She is confused not aware of her surroundings  Objective:  Vital signs in last 24 hours:  Temp:  [97.8 F (36.6 C)-98.2 F (36.8 C)] 98 F (36.7 C) (09/16 0935) Pulse Rate:  [67-73] 70 (09/16 1030) Resp:  [18-25] 25 (09/16 0943) BP: (129-155)/(66-84) 140/75 mmHg (09/16 1000) SpO2:  [94 %-100 %] 95 % (09/16 1000)  Weight change:  Filed Weights   12/02/14 1743  Weight: 60.691 kg (133 lb 12.8 oz)    Intake/Output:    Intake/Output Summary (Last 24 hours) at 12/04/14 1033 Last data filed at 12/03/14 2000  Gross per 24 hour  Intake    360 ml  Output    100 ml  Net    260 ml     Physical Exam: General:  no acute distress, laying in the bed   HEENT  anicteric, moist mucous membranes   Neck  supple, no masses   Pulm/lungs  normal effort, clear   CVS/Heart  regular rhythm, no rub or gallop   Abdomen:   soft, nontender   Extremities:  no peripheral edema   Neurologic:  alert, able to answer questions   Skin:  no acute rashes   Access:  left arm AVG        Basic Metabolic Panel:   Recent Labs Lab 12/02/14 1428 12/03/14 0619  NA 135 135  K 4.9 4.0  CL 91* 90*  CO2 29 30  GLUCOSE 163* 156*  BUN 55* 70*  CREATININE 7.85* 8.62*  CALCIUM 9.0 8.9     CBC:  Recent Labs Lab 12/02/14 1428 12/03/14 0619  WBC 4.1 3.6  NEUTROABS 2.9  --   HGB 10.2* 10.1*  HCT 30.5* 30.9*  MCV 86.3 85.9  PLT 203 216      Microbiology:  Recent Results (from the past 720 hour(s))  MRSA PCR Screening     Status: None   Collection Time: 12/02/14  9:45 PM  Result Value Ref Range Status   MRSA by PCR NEGATIVE NEGATIVE Final    Comment:        The GeneXpert MRSA Assay (FDA approved for NASAL specimens only), is one component of a comprehensive MRSA colonization surveillance program. It is not intended to diagnose MRSA infection nor to guide or monitor treatment for MRSA infections.     Coagulation  Studies:  Recent Labs  12/02/14 1428  LABPROT 14.7  INR 1.13    Urinalysis:  Recent Labs  12/02/14 1721  COLORURINE YELLOW*  LABSPEC 1.016  PHURINE 7.0  GLUCOSEU 50*  HGBUR NEGATIVE  BILIRUBINUR NEGATIVE  KETONESUR NEGATIVE  PROTEINUR >500*  NITRITE NEGATIVE  LEUKOCYTESUR NEGATIVE      Imaging: Ct Head Wo Contrast  12/02/2014   CLINICAL DATA:  Possible seizure  EXAM: CT HEAD WITHOUT CONTRAST  TECHNIQUE: Contiguous axial images were obtained from the base of the skull through the vertex without intravenous contrast.  COMPARISON:  April 21, 2014  FINDINGS: There is no midline shift, hydrocephalus, or mass. No acute hemorrhage or acute transcortical infarct is identified. There is encephalomalacia/old infarct in the left parietal and parietal occipital lobe. Old infarct are identified in bilateral basal ganglia. The bony calvarium is intact. The visualized sinuses are clear. There is chronic diffuse atrophy. Chronic bilateral periventricular white matter small vessel ischemic changes identified.  IMPRESSION: No focal acute intracranial abnormality identified.  Old infarcts unchanged compared prior exam.  Chronic diffuse atrophy and chronic bilateral periventricular white  matter small vessel ischemic change.   Electronically Signed   By: Sherian Rein M.D.   On: 12/02/2014 13:59   Dg Chest Portable 1 View  12/02/2014   CLINICAL DATA:  Seizure.  Renal failure.  EXAM: PORTABLE CHEST - 1 VIEW  COMPARISON:  December 10, 2012  FINDINGS: There is mild left midlung atelectatic change. No edema or consolidation. Heart is upper normal in size with pulmonary vascularity within normal limits. No adenopathy. There is atherosclerotic change in the aorta.  IMPRESSION: Mild left midlung atelectatic change. No edema or consolidation. No change in cardiac silhouette allowing for somewhat more shallow degree of inspiration at this time.   Electronically Signed   By: Bretta Bang III M.D.   On:  12/02/2014 14:46     Medications:     . acidophilus  1 capsule Oral QHS  . aspirin EC  81 mg Oral Daily  . atorvastatin  80 mg Oral Daily  . calcium acetate  1,334 mg Oral TID WC  . clopidogrel  75 mg Oral Daily  . docusate sodium  100 mg Oral QHS  . famotidine  20 mg Oral Daily  . feeding supplement (NEPRO CARB STEADY)  237 mL Oral BID BM  . heparin  5,000 Units Subcutaneous 3 times per day  . hydrALAZINE  25 mg Oral TID  . isosorbide mononitrate  30 mg Oral Daily  . lacosamide (VIMPAT) IV  100 mg Intravenous Q12H  . lisinopril  20 mg Oral Daily  . metoprolol tartrate  25 mg Oral BID  . omega-3 acid ethyl esters  1 g Oral Daily  . vitamin C  500 mg Oral BID  . Vitamin D (Ergocalciferol)  50,000 Units Oral Q30 days   acetaminophen **OR** acetaminophen, ondansetron **OR** ondansetron (ZOFRAN) IV, promethazine  Assessment/ Plan:  69 y.o. African American female  female with past medical history of hypertension, diabetes mellitus, end-stage renal disease on hemodialysis Monday, Wednesday, and Friday followed at the [facility] by [facility] nephrology, hyperlipidemia, cerebrovascular accident of the left frontal lobe as well as acute nonhemorrhagic small left prior occipital lobe infarct at the periphery of an old infarct admitted from dialysis after an episode of seizure  [facility]/MWF  1.  End-stage renal disease on hemodialysis Monday, Wednesday and Friday Patient seen during dialysis Tolerating well   2.  Anemia of chronic kidney disease: hemoglobin 10.1 - low dose EPO with treatment  3.  Secondary hyperparathyroidism.  - monitor phos during admission -  low Phos diet   4. Baseline Dementia  5. Seizure at dialysis outpatient Vimpat 100 q 12     LOS: 2 SINGH,HARMEET 9/16/201610:33 AM

## 2014-12-04 NOTE — Clinical Social Work Placement (Signed)
   CLINICAL SOCIAL WORK PLACEMENT  NOTE  Date:  12/04/2014  Patient Details  Name: [name] MRN: [id number] Date of Birth: 04-Oct-1945  Clinical Social Work is seeking post-discharge placement for this patient at the Skilled  Nursing Facility level of care (*CSW will initial, date and re-position this form in  chart as items are completed):  Yes   Patient/family provided with [facility] Clinical Social Work Department's list of facilities offering this level of care within the geographic area requested by the patient (or if unable, by the patient's family).  Yes   Patient/family informed of their freedom to choose among providers that offer the needed level of care, that participate in Medicare, Medicaid or managed care program needed by the patient, have an available bed and are willing to accept the patient.  Yes   Patient/family informed of [facility]'s ownership interest in [facility], as well as of the fact that they are under no obligation to receive care at these facilities.  PASRR submitted to EDS on       PASRR number received on       Existing PASRR number confirmed on 12/04/14     FL2 transmitted to all facilities in geographic area requested by pt/family on 12/04/14     FL2 transmitted to all facilities within larger geographic area on       Patient informed that his/her managed care company has contracts with or will negotiate with certain facilities, including the following:            Patient/family informed of bed offers received.  Patient chooses bed at       Physician recommends and patient chooses bed at      Patient to be transferred to   on  .  Patient to be transferred to facility by       Patient family notified on   of transfer.  Name of family member notified:        PHYSICIAN Please sign FL2     Additional Comment:    _______________________________________________ Haig Prophet, [name] 12/04/2014, 3:27  PM

## 2014-12-04 NOTE — Plan of Care (Signed)
Problem: Discharge Progression Outcomes Goal: Other Discharge Outcomes/Goals Outcome: Progressing Plan of Care Progress to Goal:   Pt is alert to self. Pt denies pain. Pt had a BM during shift. BP is elevated. No other signs of distress noted. Will continue to monitor.

## 2014-12-04 NOTE — Progress Notes (Signed)
[facility] - [place] at [facility]   PATIENT NAME: [name]    MR#:  [id number]  DATE OF BIRTH:  [birth year]  SUBJECTIVE:  CHIEF COMPLAINT:   Chief Complaint  Patient presents with  . Seizures   the patient is demented and has no complaints, no active seizure after admission. On dialysis.  REVIEW OF SYSTEMS:  CONSTITUTIONAL: No fever, fatigue or weakness.  EYES: No blurred or double vision.  EARS, NOSE, AND THROAT: No tinnitus or ear pain.  RESPIRATORY: No cough, shortness of breath, wheezing or hemoptysis.  CARDIOVASCULAR: No chest pain, orthopnea, edema.  GASTROINTESTINAL: No nausea, vomiting, diarrhea or abdominal pain.  GENITOURINARY: No dysuria, hematuria.  ENDOCRINE: No polyuria, nocturia,  HEMATOLOGY: No anemia, easy bruising or bleeding SKIN: No rash or lesion. MUSCULOSKELETAL: No joint pain or arthritis.   NEUROLOGIC: No tingling, numbness, weakness.  PSYCHIATRY: No anxiety or depression.   DRUG ALLERGIES:  No Known Allergies  VITALS:  Blood pressure 173/80, pulse 69, temperature 98.2 F (36.8 C), temperature source Oral, resp. rate 18, height 5' (1.524 m), weight 60.6 kg (133 lb 9.6 oz), SpO2 96 %.  PHYSICAL EXAMINATION:  GENERAL:  69 y.o.-year-old patient lying in the bed with no acute distress.  EYES: Pupils equal, round, reactive to light and accommodation. No scleral icterus. Extraocular muscles intact.  HEENT: Head atraumatic, normocephalic. Oropharynx and nasopharynx clear. Moist oral mucosa. NECK:  Supple, no jugular venous distention. No thyroid enlargement, no tenderness.  LUNGS: Normal breath sounds bilaterally, no wheezing, rales,rhonchi or crepitation. No use of accessory muscles of respiration.  CARDIOVASCULAR: S1, S2 normal. 2/6 systolic murmurs, no rubs, or gallops.  ABDOMEN: Soft, nontender, nondistended. Bowel sounds present. No organomegaly or mass.  EXTREMITIES: No pedal edema, cyanosis, or clubbing.  NEUROLOGIC:  Cranial nerves II through XII are intact. Muscle strength 5/5 in all extremities. Sensation intact. Gait not checked.  PSYCHIATRIC: The patient is alert and oriented x 2. Demented. SKIN: No obvious rash, lesion, or ulcer.    LABORATORY PANEL:   CBC  Recent Labs Lab 12/03/14 0619  WBC 3.6  HGB 10.1*  HCT 30.9*  PLT 216   ------------------------------------------------------------------------------------------------------------------  Chemistries   Recent Labs Lab 12/02/14 1428 12/03/14 0619  NA 135 135  K 4.9 4.0  CL 91* 90*  CO2 29 30  GLUCOSE 163* 156*  BUN 55* 70*  CREATININE 7.85* 8.62*  CALCIUM 9.0 8.9  AST 36  --   ALT 30  --   ALKPHOS 115  --   BILITOT 0.7  --    ------------------------------------------------------------------------------------------------------------------  Cardiac Enzymes  Recent Labs Lab 12/03/14 1448  TROPONINI 0.24*   ------------------------------------------------------------------------------------------------------------------  RADIOLOGY:  Ct Head Wo Contrast  12/02/2014   CLINICAL DATA:  Possible seizure  EXAM: CT HEAD WITHOUT CONTRAST  TECHNIQUE: Contiguous axial images were obtained from the base of the skull through the vertex without intravenous contrast.  COMPARISON:  April 21, 2014  FINDINGS: There is no midline shift, hydrocephalus, or mass. No acute hemorrhage or acute transcortical infarct is identified. There is encephalomalacia/old infarct in the left parietal and parietal occipital lobe. Old infarct are identified in bilateral basal ganglia. The bony calvarium is intact. The visualized sinuses are clear. There is chronic diffuse atrophy. Chronic bilateral periventricular white matter small vessel ischemic changes identified.  IMPRESSION: No focal acute intracranial abnormality identified.  Old infarcts unchanged compared prior exam.  Chronic diffuse atrophy and chronic bilateral periventricular white matter small  vessel ischemic change.  Electronically Signed   By: Sherian Rein M.D.   On: 12/02/2014 13:59   Dg Chest Portable 1 View  12/02/2014   CLINICAL DATA:  Seizure.  Renal failure.  EXAM: PORTABLE CHEST - 1 VIEW  COMPARISON:  December 10, 2012  FINDINGS: There is mild left midlung atelectatic change. No edema or consolidation. Heart is upper normal in size with pulmonary vascularity within normal limits. No adenopathy. There is atherosclerotic change in the aorta.  IMPRESSION: Mild left midlung atelectatic change. No edema or consolidation. No change in cardiac silhouette allowing for somewhat more shallow degree of inspiration at this time.   Electronically Signed   By: Bretta Bang III M.D.   On: 12/02/2014 14:46    EKG:   Orders placed or performed during the hospital encounter of 12/02/14  . ED EKG  . ED EKG  . ED EKG  . ED EKG  . EKG 12-Lead  . EKG 12-Lead  . EKG 12-Lead  . EKG 12-Lead    ASSESSMENT AND PLAN:   #1 seizures. No seizure after admission. Continue Vimpat per neurologist. on seizure precautions.  #2 end-stage renal disease on hemodialysis. On dialysis just now.  #3 hypertension-continue metoprolol, lisinopril, Hydralazine  #4 secondary hyperparathyroidism-continue PhosLo.  #5 history of previous CVA-continue Plavix, statin  * Elevated troponin, due to demanding ischemia/ESRD.  troponin trended down, continue Aspirin, plavix and lipitor.  Weakness. Per PT evaluation, the patient needed a skilled nursing facility placement.  All the records are reviewed and case discussed with Care Management/Social Workerr. Management plans discussed with the patient, her daughter and they are in agreement. Greater than 50% of time spent in coordination of care.   CODE STATUS: Full code  TOTAL TIME TAKING CARE OF THIS PATIENT: 36 minutes.   POSSIBLE D/C IN 1 DAYS, DEPENDING ON CLINICAL CONDITION.   Shaune Pollack M.D on 12/04/2014 at 1:36 PM  Between 7am to 6pm - Pager  - [phone]  After 6pm go to [email or web address] - password [email or web address] [facility]  [facility]  Office  [phone]  CC: Primary care physician; Fidel Levy, MD

## 2014-12-04 NOTE — Plan of Care (Signed)
Problem: Discharge Progression Outcomes Goal: Other Discharge Outcomes/Goals Outcome: Progressing Plan of care progress to goal for: 1. Discharge Plan:         PT recommendation SNF. 2. Pain:         Denies pain. 3. Hemodynamically Stable:         VSS.         Afebrile.         Dialysis M-W-F.  AV Fistula LFA. Removed 2L Fluid per Marquita Palms, Dialysis RN.           4. Complications:         No signs/symptoms of complications noted. 5. Diet:         Renal Carb Modified Diet.         Increased Appetite.         Feeding Supplement Nepro BID between Meals.   6. Activity:         OOB with PT. Pivot only per PT.         PT recommendation SNF.

## 2014-12-04 NOTE — Evaluation (Signed)
Physical Therapy Evaluation Patient Details Name: [name] MRN: [id number] DOB: [birth year] Today's Date: 12/04/2014   History of Present Illness  Pt is a 69 yo female who was admitted to the hospital s/p suffering a seizure during her outpatient dialysis treatment.   Clinical Impression  Pt presents with hx of stroke, MI, ESRD, DM, CAD, and CHF. Examination reveals that pt performs bed mobility at mod assist, transfers at [facility] assist, and is currently unable to safely ambulate. Pt remains limited by cognition and fatigue and is not able to provide reliable history. Pt is, however, able to participate with therapy and perform therex with increased cueing as well as a few transfers. She has generalized weakness and decreased activity tolerance. Because of this, pt will continue to benefit from skilled PT in order to address these deficits and eventually return her to premorbid state.     Follow Up Recommendations SNF    Equipment Recommendations   (TBD)    Recommendations for Other Services       Precautions / Restrictions Precautions Precautions: Fall (Seizure ) Restrictions Weight Bearing Restrictions: No      Mobility  Bed Mobility Overal bed mobility: Needs Assistance Bed Mobility: Supine to Sit     Supine to sit: Mod assist     General bed mobility comments: Pt needs assist to get to EOB and for management of LEs. Pt can assist with the initial rise from supine, but needs cueing to place hands behind her to help scoot  Transfers Overall transfer level: Needs assistance Equipment used: Rolling walker (2 wheeled) Transfers: Sit to/from Stand Sit to Stand: Mod assist         General transfer comment: Pt needs assist to get fully into standing and also to maintain standing balance with RW. She tends to lean posteriorly, but if cued to self-correct and lean forward she is able to do so, but only briefly. Needs cues for hand and foot placement prior to  transfers  Ambulation/Gait Ambulation/Gait assistance:  (Unsafe this date)              Stairs            Wheelchair Mobility    Modified Rankin (Stroke Patients Only)       Balance Overall balance assessment: Needs assistance Sitting-balance support: Bilateral upper extremity supported Sitting balance-Leahy Scale: Fair     Standing balance support: Bilateral upper extremity supported Standing balance-Leahy Scale: Poor Standing balance comment: Tends to lean posterior.                              Pertinent Vitals/Pain Pain Assessment: No/denies pain    Home Living                   Additional Comments: Due to pt disorientation/cognition, history of home environment not reliable     Prior Function           Comments: Not able to attain secondary to cognition     Hand Dominance        Extremity/Trunk Assessment   Upper Extremity Assessment: Generalized weakness           Lower Extremity Assessment: Generalized weakness         Communication   Communication:  (Hard to assess due to cognition)  Cognition Arousal/Alertness: Awake/alert Behavior During Therapy: Flat affect Overall Cognitive Status: No family/caregiver present to determine baseline cognitive functioning (Oriented to person  but not place or time)                      General Comments      Exercises Other Exercises Other Exercises: Pt performed bilateral therex x 5 reps at min assist for facilitation of movement. She also requires repeated cueing to carry out tasks. She inconsistently follow 1 step commands, but is able to participate and perform activities. Exercises included: ankle pumps, heel slides, leg press into therapists hands, chest press into therapist hands.      Assessment/Plan    PT Assessment Patient needs continued PT services  PT Diagnosis Difficulty walking;Abnormality of gait;Generalized weakness   PT Problem List  Decreased strength;Decreased range of motion;Decreased activity tolerance;Decreased balance;Decreased cognition  PT Treatment Interventions DME instruction;Gait training;Stair training;Functional mobility training;Therapeutic activities;Therapeutic exercise;Balance training;Neuromuscular re-education;Cognitive remediation;Patient/family education;Wheelchair mobility training;Manual techniques   PT Goals (Current goals can be found in the Care Plan section) Acute Rehab PT Goals Patient Stated Goal: none stated PT Goal Formulation: Patient unable to participate in goal setting Time For Goal Achievement: 12/18/14 Potential to Achieve Goals: Good    Frequency Min 2X/week   Barriers to discharge        Co-evaluation               End of Session Equipment Utilized During Treatment: Gait belt Activity Tolerance: Patient limited by fatigue Patient left: in bed;with call bell/phone within reach;with bed alarm set Nurse Communication: Mobility status         Time: 1610-9604 PT Time Calculation (min) (ACUTE ONLY): 21 min   Charges:         PT G CodesBenna Dunks 12/08/14, 11:29 AM  Benna Dunks, SPT. [phone]

## 2014-12-04 NOTE — Care Management Important Message (Signed)
Important Message  Patient Details  Name: [name] MRN: [id number] Date of Birth: 1945/10/22   Medicare Important Message Given:  Yes-second notification given    Verita Schneiders Allmond 12/04/2014, 10:29 AM

## 2014-12-04 NOTE — Discharge Summary (Addendum)
[facility] [place] [facility] [place] at [facility]   PATIENT NAME: [name]    MR#:  [id number]  DATE OF BIRTH:  03-Aug-1945  DATE OF ADMISSION:  12/02/2014 ADMITTING PHYSICIAN: Houston Siren, MD  DATE OF DISCHARGE: 12/05/2014  PRIMARY CARE PHYSICIAN: Fidel Levy, MD    ADMISSION DIAGNOSIS:  Seizures [R56.9]   DISCHARGE DIAGNOSIS:  Seizure  SECONDARY DIAGNOSIS:   Past Medical History  Diagnosis Date  . Stroke   . MI (myocardial infarction)   . Hearing impaired   . ESRD (end stage renal disease)   . Hyperlipidemia   . Hypertension   . Diabetes mellitus without complication   . Depression   . Coronary artery disease   . Dementia   . Secondary hyperparathyroidism   . Dialysis patient     Monday, Wednesday and Friday.   . Chronic systolic CHF (congestive heart failure)   . GERD (gastroesophageal reflux disease)   . Personal history of transient ischemic attack (TIA) and cerebral infarction without residual deficit   . Anemia     HOSPITAL COURSE:   #1 seizures. The patient was admitted for new onset seizure but no seizure after admission.  Vimpat was started per neurologist and will be continued. She will need to follow up with neurologist as outpatient in 4 weeks.  #2 end-stage renal disease on hemodialysis. Continue HD per former schedule.  #3 hypertension-continue metoprolol, lisinopril, Hydralazine  #4 secondary hyperparathyroidism-continue PhosLo.  #5 history of previous CVA-continue Plavix, statin  DISCHARGE CONDITIONS:   Stable.  CONSULTS OBTAINED:    neurologist  DRUG ALLERGIES:  No Known Allergies  DISCHARGE MEDICATIONS:   Current Discharge Medication List    CONTINUE these medications which have NOT CHANGED   Details  aspirin EC 81 MG tablet Take 81 mg by mouth daily.    atorvastatin (LIPITOR) 80 MG tablet Take 1 tablet (80 mg total) by mouth daily. Qty: 90 tablet, Refills: 3    calcium acetate (PHOSLO) 667 MG  capsule Take 1,334 mg by mouth 3 (three) times daily with meals.     clopidogrel (PLAVIX) 75 MG tablet Take 1 tablet (75 mg total) by mouth daily. Qty: 30 tablet, Refills: 3    docusate sodium (COLACE) 100 MG capsule Take 100 mg by mouth at bedtime.     hydrALAZINE (APRESOLINE) 25 MG tablet Take 1 tablet (25 mg total) by mouth 3 (three) times daily. Qty: 90 tablet, Refills: 3    isosorbide mononitrate (IMDUR) 30 MG 24 hr tablet Take 1 tablet (30 mg total) by mouth daily. Qty: 90 tablet, Refills: 3    lactobacillus acidophilus (BACID) TABS tablet Take 1 tablet by mouth at bedtime.    lisinopril (PRINIVIL,ZESTRIL) 20 MG tablet Take 1 tablet (20 mg total) by mouth daily. Qty: 90 tablet, Refills: 3    metoprolol tartrate (LOPRESSOR) 25 MG tablet Take 25 mg by mouth 2 (two) times daily.    Omega-3 Fatty Acids (FISH OIL) 500 MG CAPS Take 500 mg by mouth 2 (two) times daily.    promethazine (PHENERGAN) 25 MG tablet Take 25 mg by mouth every 6 (six) hours as needed for nausea or vomiting.    ranitidine (ZANTAC) 150 MG capsule Take 1 capsule (150 mg total) by mouth every evening. Qty: 90 capsule, Refills: 3    vitamin C (ASCORBIC ACID) 500 MG tablet Take 500 mg by mouth 2 (two) times daily.     Vitamin D, Ergocalciferol, (DRISDOL) 50000 UNITS CAPS capsule Take 50,000  Units by mouth every 30 (thirty) days. Pt takes on the 7th of every month.         DISCHARGE INSTRUCTIONS:   DIET:  Renal diet  DISCHARGE CONDITION:  Stable  ACTIVITY:  Activity as tolerated  OXYGEN:  Home Oxygen: No.   Oxygen Delivery: room air  DISCHARGE LOCATION:  nursing home   If you experience worsening of your admission symptoms, develop shortness of breath, life threatening emergency, suicidal or homicidal thoughts you must seek medical attention immediately by calling 911 or calling your MD immediately  if symptoms less severe.  You Must read complete instructions/literature along with all the  possible adverse reactions/side effects for all the Medicines you take and that have been prescribed to you. Take any new Medicines after you have completely understood and accpet all the possible adverse reactions/side effects.   Please note  You were cared for by a hospitalist during your hospital stay. If you have any questions about your discharge medications or the care you received while you were in the hospital after you are discharged, you can call the unit and asked to speak with the hospitalist on call if the hospitalist that took care of you is not available. Once you are discharged, your primary care physician will handle any further medical issues. Please note that NO REFILLS for any discharge medications will be authorized once you are discharged, as it is imperative that you return to your primary care physician (or establish a relationship with a primary care physician if you do not have one) for your aftercare needs so that they can reassess your need for medications and monitor your lab values.      Today   SUBJECTIVE   No complaints. Not oriented.   VITAL SIGNS:  Blood pressure 173/80, pulse 69, temperature 98.2 F (36.8 C), temperature source Oral, resp. rate 18, height 5' (1.524 m), weight 60.6 kg (133 lb 9.6 oz), SpO2 96 %.  I/O:   Intake/Output Summary (Last 24 hours) at 12/04/14 1332 Last data filed at 12/04/14 1315  Gross per 24 hour  Intake    240 ml  Output   2100 ml  Net  -1860 ml    PHYSICAL EXAMINATION:  GENERAL:  69 y.o.-year-old patient lying in the bed with no acute distress.  EYES: Pupils equal, round, reactive to light and accommodation. No scleral icterus. Extraocular muscles intact.  HEENT: Head atraumatic, normocephalic. Oropharynx and nasopharynx clear.  NECK:  Supple, no jugular venous distention. No thyroid enlargement, no tenderness.  LUNGS: Normal breath sounds bilaterally, no wheezing, rales,rhonchi or crepitation. No use of accessory  muscles of respiration.  CARDIOVASCULAR: S1, S2 normal. 2/6 systolic murmurs, no rubs, or gallops.  ABDOMEN: Soft, non-tender, non-distended. Bowel sounds present. No organomegaly or mass.  EXTREMITIES: No pedal edema, cyanosis, or clubbing.  NEUROLOGIC: Cranial nerves II through XII are intact. Muscle strength 5/5 in all extremities. Sensation intact. Gait not checked.  PSYCHIATRIC: The patient is alert and oriented x 3.  SKIN: No obvious rash, lesion, or ulcer.   DATA REVIEW:   CBC  Recent Labs Lab 12/03/14 0619  WBC 3.6  HGB 10.1*  HCT 30.9*  PLT 216    Chemistries   Recent Labs Lab 12/02/14 1428 12/03/14 0619  NA 135 135  K 4.9 4.0  CL 91* 90*  CO2 29 30  GLUCOSE 163* 156*  BUN 55* 70*  CREATININE 7.85* 8.62*  CALCIUM 9.0 8.9  AST 36  --   ALT  30  --   ALKPHOS 115  --   BILITOT 0.7  --     Cardiac Enzymes  Recent Labs Lab 12/03/14 1448  TROPONINI 0.24*    Microbiology Results  Results for orders placed or performed during the hospital encounter of 12/02/14  MRSA PCR Screening     Status: None   Collection Time: 12/02/14  9:45 PM  Result Value Ref Range Status   MRSA by PCR NEGATIVE NEGATIVE Final    Comment:        The GeneXpert MRSA Assay (FDA approved for NASAL specimens only), is one component of a comprehensive MRSA colonization surveillance program. It is not intended to diagnose MRSA infection nor to guide or monitor treatment for MRSA infections.     RADIOLOGY:  Ct Head Wo Contrast  12/02/2014   CLINICAL DATA:  Possible seizure  EXAM: CT HEAD WITHOUT CONTRAST  TECHNIQUE: Contiguous axial images were obtained from the base of the skull through the vertex without intravenous contrast.  COMPARISON:  April 21, 2014  FINDINGS: There is no midline shift, hydrocephalus, or mass. No acute hemorrhage or acute transcortical infarct is identified. There is encephalomalacia/old infarct in the left parietal and parietal occipital lobe. Old infarct  are identified in bilateral basal ganglia. The bony calvarium is intact. The visualized sinuses are clear. There is chronic diffuse atrophy. Chronic bilateral periventricular white matter small vessel ischemic changes identified.  IMPRESSION: No focal acute intracranial abnormality identified.  Old infarcts unchanged compared prior exam.  Chronic diffuse atrophy and chronic bilateral periventricular white matter small vessel ischemic change.   Electronically Signed   By: Sherian Rein M.D.   On: 12/02/2014 13:59   Dg Chest Portable 1 View  12/02/2014   CLINICAL DATA:  Seizure.  Renal failure.  EXAM: PORTABLE CHEST - 1 VIEW  COMPARISON:  December 10, 2012  FINDINGS: There is mild left midlung atelectatic change. No edema or consolidation. Heart is upper normal in size with pulmonary vascularity within normal limits. No adenopathy. There is atherosclerotic change in the aorta.  IMPRESSION: Mild left midlung atelectatic change. No edema or consolidation. No change in cardiac silhouette allowing for somewhat more shallow degree of inspiration at this time.   Electronically Signed   By: Bretta Bang III M.D.   On: 12/02/2014 14:46        Management plans discussed with the patient, family and they are in agreement.  CODE STATUS:     Code Status Orders        Start     Ordered   12/02/14 1737  Full code   Continuous     12/02/14 1736    Advance Directive Documentation        Most Recent Value   Type of Advance Directive  Healthcare Power of Attorney   Pre-existing out of facility DNR order (yellow form or pink MOST form)     "MOST" Form in Place?        TOTAL TIME TAKING CARE OF THIS PATIENT: 36 minutes.    Shaune Pollack M.D on 12/04/2014 at 1:32 PM  Between 7am to 6pm - Pager - [phone]  After 6pm go to [email or web address] - password [email or web address] [facility]  [place] [facility]  Office  [phone]  CC: Primary care physician; Fidel Levy, MD

## 2014-12-04 NOTE — Clinical Social Work Note (Signed)
Clinical Social Work Assessment  Patient Details  Name: [name] MRN: [id number] Date of Birth: March 03, 1946  Date of referral:  12/04/14               Reason for consult:  Facility Placement                Permission sought to share information with:  Chartered certified accountant granted to share information::  Yes, Verbal Permission Granted  Name::      [place]               Relationship::   [place]:     Housing/Transportation Living arrangements for the past 2 months:  [place] of Information:  Patient Patient Interpreter Needed:  None Criminal Activity/Legal Involvement Pertinent to Current Situation/Hospitalization:  No - Comment as needed Significant Relationships:  Adult Children Lives with:  Self Do you feel safe going back to the place where you live?  Yes Need for family participation in patient care:  Yes (Comment)  Care giving concerns: Per patient she lives alone in [place].    Social Worker assessment / plan: Holiday representative (CSW) received verbal consult from PT that they are recommending SNF. CSW met with patient alone at bedside. Patient appeared to be confused and reported that she lives in [place]. Patient did state that she was on dialysis. Per Maudie Mercury dialysis coordinator patient goes to [facility] on [address] in [place]. CSW explained that PT is recommending rehab. Patient did give CSW permission to fax out referral. CSW explained that patient will have to go to a SNF that will transport to dialysis and can accept her over the weekend. Per MD patient will be ready for D/C Saturday.  [facility] made a bed offer. CSW asked if patient wanted to go to [facility] and she shook her head yes. CSW left a voicemail for patient's daughter Hershal Coria.    Employment status:  Disabled (Comment on whether or not currently receiving Disability), Retired Office manager PT Recommendations:  [place] / Referral to community resources:  [place]  Patient/Family's Response to care: Patient is agreeable to SNF search however appears to be confused.   Patient/Family's Understanding of and Emotional Response to Diagnosis, Current Treatment, and Prognosis: CSW will continue to try and reach patient's daughter.   Emotional Assessment Appearance:  Appears stated age Attitude/Demeanor/Rapport:    Affect (typically observed):  Flat, Quiet Orientation:  Fluctuating Orientation (Suspected and/or reported Sundowners) Alcohol / Substance use:  Not Applicable Psych involvement (Current and /or in the community):  No (Comment)  Discharge Needs  Concerns to be addressed:  Cognitive Concerns Readmission within the last 30 days:  No Current discharge risk:  Cognitively Impaired Barriers to Discharge:  Continued Medical Work up   Elwyn Reach 12/04/2014, 3:38 PM

## 2014-12-05 MED ORDER — NEPRO/CARBSTEADY PO LIQD: 237.0000 mL | Freq: Two times a day (BID) | ORAL | Status: DC

## 2014-12-05 MED ORDER — LACOSAMIDE 100 MG PO TABS: 1.0000 | ORAL_TABLET | Freq: Two times a day (BID) | ORAL | Status: DC

## 2014-12-05 MED ORDER — EPOETIN ALFA 10000 UNIT/ML IJ SOLN: 4000.0000 [IU] | INTRAMUSCULAR | Status: DC

## 2014-12-05 NOTE — Plan of Care (Signed)
Problem: Discharge Progression Outcomes Goal: Other Discharge Outcomes/Goals Outcome: Progressing Plan of Care Progress to Goal:   Pt denies pain. Pt has been resting comfortable during shift. No seizure activity during shift. No other signs of distress noted. Will continue to monitor.

## 2014-12-05 NOTE — Plan of Care (Signed)
Problem: Discharge Progression Outcomes Goal: Activity appropriate for discharge plan Outcome: Not Progressing Pt is alert with confusion, baseline with dementia, bedbound, needs help with mobility in bed, history of CVA, resting in bed inbetween care, good appetite, needs tray set up assistance, oliguria, hemodialysis performed on 9/16. [facility], report given to carolyn, pt to be transported via EMS, care management updated patient's family on discharge plan. Uneventful shift.

## 2014-12-05 NOTE — Progress Notes (Signed)
Subjective:   Patient denies any acute complaints No shortness of breath Still not oriented to place  Objective:  Vital signs in last 24 hours:  Temp:  [98.1 F (36.7 C)-98.7 F (37.1 C)] 98.6 F (37 C) (09/17 0515) Pulse Rate:  [64-103] 72 (09/17 0516) Resp:  [18-20] 20 (09/17 0515) BP: (99-173)/(66-98) 153/79 mmHg (09/17 0516) SpO2:  [94 %-97 %] 97 % (09/17 0515) Weight:  [62.9 kg (138 lb 10.7 oz)] 62.9 kg (138 lb 10.7 oz) (09/16 1330)  Weight change:  Filed Weights   12/02/14 1743 12/04/14 0935 12/04/14 1330  Weight: 60.691 kg (133 lb 12.8 oz) 60.6 kg (133 lb 9.6 oz) 62.9 kg (138 lb 10.7 oz)    Intake/Output:    Intake/Output Summary (Last 24 hours) at 12/05/14 1038 Last data filed at 12/04/14 1315  Gross per 24 hour  Intake      0 ml  Output   2000 ml  Net  -2000 ml     Physical Exam: General:  no acute distress, laying in the bed   HEENT  anicteric, moist mucous membranes   Neck  supple, no masses   Pulm/lungs  normal effort, clear   CVS/Heart  regular rhythm, no rub or gallop   Abdomen:   soft, nontender   Extremities:  no peripheral edema   Neurologic:  alert, able to answer questions , not oriented to place  Skin:  no acute rashes   Access:  left arm AVG        Basic Metabolic Panel:   Recent Labs Lab 12/02/14 1428 12/03/14 0619  NA 135 135  K 4.9 4.0  CL 91* 90*  CO2 29 30  GLUCOSE 163* 156*  BUN 55* 70*  CREATININE 7.85* 8.62*  CALCIUM 9.0 8.9     CBC:  Recent Labs Lab 12/02/14 1428 12/03/14 0619  WBC 4.1 3.6  NEUTROABS 2.9  --   HGB 10.2* 10.1*  HCT 30.5* 30.9*  MCV 86.3 85.9  PLT 203 216      Microbiology:  Recent Results (from the past 720 hour(s))  MRSA PCR Screening     Status: None   Collection Time: 12/02/14  9:45 PM  Result Value Ref Range Status   MRSA by PCR NEGATIVE NEGATIVE Final    Comment:        The GeneXpert MRSA Assay (FDA approved for NASAL specimens only), is one component of a comprehensive  MRSA colonization surveillance program. It is not intended to diagnose MRSA infection nor to guide or monitor treatment for MRSA infections.     Coagulation Studies:  Recent Labs  12/02/14 1428  LABPROT 14.7  INR 1.13    Urinalysis:  Recent Labs  12/02/14 1721  COLORURINE YELLOW*  LABSPEC 1.016  PHURINE 7.0  GLUCOSEU 50*  HGBUR NEGATIVE  BILIRUBINUR NEGATIVE  KETONESUR NEGATIVE  PROTEINUR >500*  NITRITE NEGATIVE  LEUKOCYTESUR NEGATIVE      Imaging: No results found.   Medications:     . acidophilus  1 capsule Oral QHS  . aspirin EC  81 mg Oral Daily  . atorvastatin  80 mg Oral Daily  . calcium acetate  1,334 mg Oral TID WC  . clopidogrel  75 mg Oral Daily  . docusate sodium  100 mg Oral QHS  . famotidine  20 mg Oral Daily  . feeding supplement (NEPRO CARB STEADY)  237 mL Oral BID BM  . heparin  5,000 Units Subcutaneous 3 times per day  . hydrALAZINE  25 mg Oral TID  . isosorbide mononitrate  30 mg Oral Daily  . lacosamide (VIMPAT) IV  100 mg Intravenous Q12H  . lisinopril  20 mg Oral Daily  . metoprolol tartrate  25 mg Oral BID  . omega-3 acid ethyl esters  1 g Oral Daily  . vitamin C  500 mg Oral BID  . Vitamin D (Ergocalciferol)  50,000 Units Oral Q30 days   acetaminophen **OR** acetaminophen, ondansetron **OR** ondansetron (ZOFRAN) IV, promethazine  Assessment/ Plan:  69 y.o. African American female  female with past medical history of hypertension, diabetes mellitus, end-stage renal disease on hemodialysis Monday, Wednesday, and Friday followed at the [facility] by [facility] nephrology, hyperlipidemia, cerebrovascular accident of the left frontal lobe as well as acute nonhemorrhagic small left prior occipital lobe infarct at the periphery of an old infarct admitted from dialysis after an episode of seizure  [facility]/MWF  1.  End-stage renal disease on hemodialysis Monday, Wednesday and Friday Next hemodialysis session  planned for Monday  2.  Anemia of chronic kidney disease: - low dose EPO with treatment  3.  Secondary hyperparathyroidism.  - monitor phos during admission -  low Phos diet   4. Baseline Dementia  5. Seizure at dialysis outpatient Vimpat iv 100 q 12     LOS: 3 SINGH,HARMEET 9/17/201610:38 AM

## 2014-12-07 ENCOUNTER — Other Ambulatory Visit: Payer: Self-pay | Admitting: Vascular Surgery

## 2014-12-09 NOTE — Progress Notes (Signed)
Late entry 12/09/14 for 12/05/14  CSW was notified that Pt has been cleared for dc. CSW followed up with Pt in the room, no family at bedside. CSW shared available SNF bed options with Pt. Pt selected [facility]. Pt pleasant and stated that she had no questions. CSW prepared dc packet and faxed dc summary to facility. CSW left message with [name] regarding dc to SNF [facility].   No further CSW needs at this time.   Wilford Grist, [name] [phone]

## 2014-12-09 NOTE — Progress Notes (Addendum)
CSW followed up with [name] to discuss Pt's transition at [facility]. Pt's family is considering moving her to [facility] (who was full at Pt's dc time) because it is close to their home. CSW discussed process as well as how things are going at [facility]. [name] reports having issues with night staff at center. CSW emailed daughter ombudsman information and resources regarding communicating concerns to SNF staff.   No further needs at this time.   Wilford Grist, [name] [phone]

## 2014-12-09 NOTE — Progress Notes (Signed)
Late entry 12/09/14 for 12/06/14  CSW was notified by RN [facility] that Pt's family came to hospital last night to sit with patient and were not aware of dc. CSW got #'s from [facility] for [name] Ms. Love to follow up and offer support or answer questions regarding dc to [facility]. [name] stated that she appreciated the call and "everything is fine now."    Wilford Grist, [place] [phone]

## 2014-12-09 NOTE — Clinical Social Work Placement (Signed)
Late entry 12/09/14 for 12/05/14  CLINICAL SOCIAL WORK PLACEMENT  NOTE  Date:  12/09/2014  Patient Details  Name: [name] MRN: [id number] Date of Birth: 11/08/1945  Clinical Social Work is seeking post-discharge placement for this patient at the Skilled  Nursing Facility level of care (*CSW will initial, date and re-position this form in  chart as items are completed):  Yes   Patient/family provided with [place] Clinical Social Work Department's list of facilities offering this level of care within the geographic area requested by the patient (or if unable, by the patient's family).  Yes   Patient/family informed of their freedom to choose among providers that offer the needed level of care, that participate in Medicare, Medicaid or managed care program needed by the patient, have an available bed and are willing to accept the patient.  Yes   Patient/family informed of [place] [facility]'s ownership interest in [facility] and [facility], as well as of the fact that they are under no obligation to receive care at these facilities.  PASRR submitted to EDS on       PASRR number received on       Existing PASRR number confirmed on 12/04/14     FL2 transmitted to all facilities in geographic area requested by pt/family on 12/04/14     FL2 transmitted to all facilities within larger geographic area on       Patient informed that his/her managed care company has contracts with or will negotiate with certain facilities, including the following:        Yes   Patient/family informed of bed offers received.  Patient chooses bed at [facility]     Physician recommends and patient chooses bed at      Patient to be transferred to [facility] on 12/05/14.  Patient to be transferred to facility by EMS     Patient family notified on 12/05/14 of transfer.  Name of family member notified:  Left message with Daughter Elenor Love     PHYSICIAN Please sign  FL2     Additional Comment:    _______________________________________________ Ned Card, [name] 12/09/2014, 10:31 PM

## 2014-12-10 ENCOUNTER — Encounter: Admission: RE | Payer: Self-pay | Source: Ambulatory Visit

## 2014-12-10 SURGERY — A/V SHUNTOGRAM/FISTULAGRAM
Anesthesia: Moderate Sedation | Laterality: Left

## 2014-12-11 ENCOUNTER — Ambulatory Visit: Admission: RE | Admit: 2014-12-11 | Payer: Medicare Other | Source: Ambulatory Visit | Admitting: Vascular Surgery

## 2014-12-21 ENCOUNTER — Encounter: Payer: Self-pay | Admitting: Emergency Medicine

## 2014-12-21 ENCOUNTER — Emergency Department
Admission: EM | Admit: 2014-12-21 | Discharge: 2014-12-21 | Disposition: A | Discharge status: Home / Self Care | Payer: Medicare Other | Attending: Emergency Medicine | Admitting: Emergency Medicine

## 2014-12-21 ENCOUNTER — Emergency Department: Payer: Medicare Other

## 2014-12-21 DIAGNOSIS — W1839XA Other fall on same level, initial encounter: Secondary | ICD-10-CM | POA: Insufficient documentation

## 2014-12-21 DIAGNOSIS — Y92122 Bedroom in nursing home as the place of occurrence of the external cause: Secondary | ICD-10-CM | POA: Insufficient documentation

## 2014-12-21 DIAGNOSIS — Y92129 Unspecified place in nursing home as the place of occurrence of the external cause: Secondary | ICD-10-CM

## 2014-12-21 DIAGNOSIS — N186 End stage renal disease: Secondary | ICD-10-CM | POA: Insufficient documentation

## 2014-12-21 DIAGNOSIS — Y9389 Activity, other specified: Secondary | ICD-10-CM | POA: Diagnosis not present

## 2014-12-21 DIAGNOSIS — W19XXXA Unspecified fall, initial encounter: Secondary | ICD-10-CM

## 2014-12-21 DIAGNOSIS — Z043 Encounter for examination and observation following other accident: Secondary | ICD-10-CM | POA: Insufficient documentation

## 2014-12-21 DIAGNOSIS — Z7902 Long term (current) use of antithrombotics/antiplatelets: Secondary | ICD-10-CM | POA: Diagnosis not present

## 2014-12-21 DIAGNOSIS — Y998 Other external cause status: Secondary | ICD-10-CM | POA: Diagnosis not present

## 2014-12-21 DIAGNOSIS — Z992 Dependence on renal dialysis: Secondary | ICD-10-CM | POA: Diagnosis not present

## 2014-12-21 DIAGNOSIS — I120 Hypertensive chronic kidney disease with stage 5 chronic kidney disease or end stage renal disease: Secondary | ICD-10-CM | POA: Diagnosis not present

## 2014-12-21 DIAGNOSIS — E119 Type 2 diabetes mellitus without complications: Secondary | ICD-10-CM | POA: Insufficient documentation

## 2014-12-21 DIAGNOSIS — Z79899 Other long term (current) drug therapy: Secondary | ICD-10-CM | POA: Insufficient documentation

## 2014-12-21 DIAGNOSIS — Z7982 Long term (current) use of aspirin: Secondary | ICD-10-CM | POA: Insufficient documentation

## 2014-12-21 NOTE — Discharge Instructions (Signed)

## 2014-12-21 NOTE — ED Provider Notes (Signed)
[facility] Emergency Department Provider Note  ____________________________________________  Time seen: On arrival, the EMS  I have reviewed the triage vital signs and the nursing notes.   HISTORY  Chief Complaint Fall   history significantly limited by chronic dementia. History is per EMS   HPI Elira Droz is a 69 y.o. female who lives at [facility] and apparently was found down last night. Staff put her back in bed but she was apparently complaining ofleg pain and arm pain bilaterally. Patient's has end-stage renal disease and typically gets dialysis on Monday Wednesday Friday. She is due for dialysis today. No other history provided     Past Medical History  Diagnosis Date  . Stroke   . MI (myocardial infarction)   . Hearing impaired   . ESRD (end stage renal disease)   . Hyperlipidemia   . Hypertension   . Diabetes mellitus without complication   . Depression   . Coronary artery disease   . Dementia   . Secondary hyperparathyroidism   . Dialysis patient     Monday, Wednesday and Friday.   . Chronic systolic CHF (congestive heart failure)   . GERD (gastroesophageal reflux disease)   . Personal history of transient ischemic attack (TIA) and cerebral infarction without residual deficit   . Anemia     Patient Active Problem List   Diagnosis Date Noted  . Seizures (HCC) 12/02/2014  . Hyperlipidemia 08/11/2014  . End stage renal disease on dialysis (HCC) 05/12/2014  . Chronic systolic CHF (congestive heart failure) (HCC) 05/12/2014  . Secondary hypertension, unspecified 05/12/2014  . Type 2 diabetes mellitus with other circulatory complications (HCC) 05/12/2014  . Non-STEMI (non-ST elevated myocardial infarction) (HCC) 05/12/2014  . Moderate aortic valve stenosis 05/12/2014    Past Surgical History  Procedure Laterality Date  . Cataract extraction, bilateral    . Wrist surgery    . Peripheral vascular catheterization Left  07/28/2014    Procedure: A/V Shuntogram/Fistulagram;  Surgeon: Renford Dills, MD;  Location: [facility] INVASIVE CV LAB;  Service: Cardiovascular;  Laterality: Left;  . Peripheral vascular catheterization Left 07/28/2014    Procedure: A/V Shunt Intervention;  Surgeon: Renford Dills, MD;  Location: [facility] INVASIVE CV LAB;  Service: Cardiovascular;  Laterality: Left;    Current Outpatient Rx  Name  Route  Sig  Dispense  Refill  . aspirin EC 81 MG tablet   Oral   Take 81 mg by mouth daily.         [name] atorvastatin (LIPITOR) 80 MG tablet   Oral   Take 1 tablet (80 mg total) by mouth daily.   90 tablet   3   . calcium acetate (PHOSLO) 667 MG capsule   Oral   Take 1,334 mg by mouth 3 (three) times daily with meals.          . clopidogrel (PLAVIX) 75 MG tablet   Oral   Take 1 tablet (75 mg total) by mouth daily.   30 tablet   3   . docusate sodium (COLACE) 100 MG capsule   Oral   Take 100 mg by mouth at bedtime.          . hydrALAZINE (APRESOLINE) 25 MG tablet   Oral   Take 1 tablet (25 mg total) by mouth 3 (three) times daily. Patient taking differently: Take 25 mg by mouth 2 (two) times daily.    90 tablet   3   . isosorbide mononitrate (IMDUR) 30 MG 24  hr tablet   Oral   Take 1 tablet (30 mg total) by mouth daily.   90 tablet   3   . Lacosamide 100 MG TABS   Oral   Take 1 tablet (100 mg total) by mouth 2 (two) times daily.   60 tablet   0   . lactobacillus acidophilus (BACID) TABS tablet   Oral   Take 1 tablet by mouth at bedtime.         [name] lisinopril (PRINIVIL,ZESTRIL) 20 MG tablet   Oral   Take 1 tablet (20 mg total) by mouth daily.   90 tablet   3   . metoprolol tartrate (LOPRESSOR) 25 MG tablet   Oral   Take 25 mg by mouth 2 (two) times daily.         . Nutritional Supplements (FEEDING SUPPLEMENT, NEPRO CARB STEADY,) LIQD   Oral   Take 237 mLs by mouth 2 (two) times daily between meals.   60 Can   0   . Omega-3 Fatty Acids (FISH OIL) 500 MG  CAPS   Oral   Take 500 mg by mouth 2 (two) times daily.         . promethazine (PHENERGAN) 25 MG tablet   Oral   Take 25 mg by mouth every 6 (six) hours as needed for nausea or vomiting.         . ranitidine (ZANTAC) 150 MG capsule   Oral   Take 1 capsule (150 mg total) by mouth every evening. Patient taking differently: Take 150 mg by mouth daily.    90 capsule   3   . vitamin C (ASCORBIC ACID) 500 MG tablet   Oral   Take 500 mg by mouth 2 (two) times daily.          . Vitamin D, Ergocalciferol, (DRISDOL) 50000 UNITS CAPS capsule   Oral   Take 50,000 Units by mouth every 30 (thirty) days. Pt takes on the 7th of every month.           Allergies Review of patient's allergies indicates no known allergies.  Family History  Problem Relation Age of Onset  . Family history unknown: Yes    Social History Social History  Substance Use Topics  . Smoking status: Never Smoker   . Smokeless tobacco: Not on file  . Alcohol Use: No    Review of Systems Limited by dementia   Cardiovascular: She denies chest pain. Respiratory: She denies shortness of breath Gastrointestinal: She denies abdominal pain  Musculoskeletal: Negative for back pain. She complains of leg pain bilaterally Skin: Negative for rash. Neurological: Negative for headaches or focal weakness Psychiatric: No anxiety    ____________________________________________   PHYSICAL EXAM:  VITAL SIGNS: BP 183/93 mmHg  Pulse 80  Temp(Src) 98.1 F (36.7 C)  Resp 17  Ht  (1.626 m)  Wt 134 lb 11.2 oz (61.1 kg)  BMI 23.11 kg/m2  SpO2 95%  ED Triage Vitals  Enc Vitals Group     BP --      Pulse --      Resp --      Temp --      Temp src --      SpO2 --      Weight --      Height --      Head Cir --      [facility] Flow --      Pain Score --      Pain  Loc --      Pain Edu? --      Excl. in GC? --      Constitutional: Well appearing and in no distress. Eyes: Conjunctivae are normal.   ENT   Head: Normocephalic and atraumatic. Full range of motion of neck without pain   Mouth/Throat: Mucous membranes are moist. Cardiovascular: Normal rate, regular rhythm. Normal and symmetric distal pulses are present in all extremities. No murmurs, rubs, or gallops. Respiratory: Normal respiratory effort without tachypnea nor retractions. Breath sounds are clear and equal bilaterally.  Gastrointestinal: Soft and non-tender in all quadrants. No distention. There is no CVA tenderness. Genitourinary: deferred Musculoskeletal: No injuries or abrasions noted. Performed extensive passive ranging of both hips without any pain. Full range of motion of all joints without noticeable pain Neurologic:  . No gross focal neurologic deficits are appreciated. Skin:  Skin is warm, dry and intact. No rash noted. Psychiatric: Mood and affect are normal.   ____________________________________________    LABS (pertinent positives/negatives)  Labs Reviewed - No data to display  ____________________________________________   EKG  ED ECG REPORT I, Jene Every, the attending physician, personally viewed and interpreted this ECG.   Date: 12/21/2014  EKG Time: 8:22 AM  Rate: 80  Rhythm: normal sinus rhythm  Axis: Normal  Intervals:none  ST&T Change: Nonspecific   ____________________________________________    RADIOLOGY I have personally reviewed any xrays that were ordered on this patient: Pelvis x-ray unremarkable  ____________________________________________   PROCEDURES  Procedure(s) performed: none  Critical Care performed: none  ____________________________________________   INITIAL IMPRESSION / ASSESSMENT AND PLAN / ED COURSE  Pertinent labs & imaging results that were available during my care of the patient were reviewed by me and considered in my medical decision making (see chart for details).  Patient overall well-appearing. No exam findings of injury. I will  obtain x-ray of pelvis given complaint of bilateral leg pain. Do not feel blood work is necessary at this time as this was likely a mechanical fall. EKG shows no signs of hyperkalemia.   Pelvis x-ray shows no fracture  I helped the patient stand up and she was able to ambulate on her own. Daughter reports the patient is at her baseline and she will take her to dialysis today ____________________________________________   FINAL CLINICAL IMPRESSION(S) / ED DIAGNOSES  Final diagnoses:  Fall at nursing home, initial encounter     Jene Every, MD 12/21/14 1429

## 2014-12-21 NOTE — ED Notes (Signed)
Pt arrived via EMS from [facility]. Staff found pt on floor after an unwitnessed fall. Pt complains of pain in head, abdomen and hips. EMS vital signs BP 168/94, P 81, R 16. Pt alert. History of dementia.

## 2014-12-21 NOTE — ED Notes (Signed)
Patient transported to X-ray 

## 2015-01-18 ENCOUNTER — Telehealth: Payer: Self-pay | Admitting: Family Medicine

## 2015-01-18 NOTE — Telephone Encounter (Signed)
Gasper LloydLinda Collins, physical therapist with [facility] asked for an extension of home health physical therapy for an additional week.  Pt has had to cancel a few appts for various reasons.  Her call back number is [phone] or [phone]

## 2015-01-18 NOTE — Telephone Encounter (Signed)
OK to prolong order for PT at existing  schedule for another week.-jh

## 2015-01-19 NOTE — Telephone Encounter (Signed)
Verbal given 

## 2015-01-20 ENCOUNTER — Other Ambulatory Visit: Payer: Self-pay | Admitting: Vascular Surgery

## 2015-01-21 ENCOUNTER — Ambulatory Visit: Admission: RE | Admit: 2015-01-21 | Payer: Medicare Other | Source: Ambulatory Visit | Admitting: Vascular Surgery

## 2015-01-21 ENCOUNTER — Encounter: Admission: RE | Payer: Self-pay | Source: Ambulatory Visit

## 2015-01-21 SURGERY — A/V SHUNTOGRAM/FISTULAGRAM
Anesthesia: Moderate Sedation

## 2015-01-21 MED ORDER — DEXTROSE 5 % IV SOLN: 1.5000 g | INTRAVENOUS | Status: DC

## 2015-02-01 ENCOUNTER — Other Ambulatory Visit: Payer: Self-pay | Admitting: Vascular Surgery

## 2015-02-04 ENCOUNTER — Inpatient Hospital Stay: Payer: Medicare Other

## 2015-02-04 ENCOUNTER — Encounter: Payer: Self-pay | Admitting: *Deleted

## 2015-02-04 ENCOUNTER — Ambulatory Visit: Payer: Medicare Other

## 2015-02-04 ENCOUNTER — Inpatient Hospital Stay
Admission: RE | Admit: 2015-02-04 | Discharge: 2015-02-23 | DRG: 252 | Disposition: A | Discharge status: Skilled Nursing Facility | Payer: Medicare Other | Source: Ambulatory Visit | Attending: Internal Medicine | Admitting: Internal Medicine

## 2015-02-04 ENCOUNTER — Encounter
Admission: RE | Disposition: A | Discharge status: Skilled Nursing Facility | Payer: Medicare Other | Source: Ambulatory Visit | Attending: Internal Medicine

## 2015-02-04 DIAGNOSIS — I119 Hypertensive heart disease without heart failure: Secondary | ICD-10-CM | POA: Insufficient documentation

## 2015-02-04 DIAGNOSIS — I422 Other hypertrophic cardiomyopathy: Secondary | ICD-10-CM | POA: Diagnosis present

## 2015-02-04 DIAGNOSIS — Z992 Dependence on renal dialysis: Secondary | ICD-10-CM | POA: Diagnosis not present

## 2015-02-04 DIAGNOSIS — T68XXXA Hypothermia, initial encounter: Secondary | ICD-10-CM | POA: Diagnosis present

## 2015-02-04 DIAGNOSIS — Y712 Prosthetic and other implants, materials and accessory cardiovascular devices associated with adverse incidents: Secondary | ICD-10-CM | POA: Diagnosis not present

## 2015-02-04 DIAGNOSIS — Y832 Surgical operation with anastomosis, bypass or graft as the cause of abnormal reaction of the patient, or of later complication, without mention of misadventure at the time of the procedure: Secondary | ICD-10-CM | POA: Diagnosis not present

## 2015-02-04 DIAGNOSIS — E1122 Type 2 diabetes mellitus with diabetic chronic kidney disease: Secondary | ICD-10-CM | POA: Diagnosis not present

## 2015-02-04 DIAGNOSIS — Z7982 Long term (current) use of aspirin: Secondary | ICD-10-CM | POA: Diagnosis not present

## 2015-02-04 DIAGNOSIS — F0390 Unspecified dementia without behavioral disturbance: Secondary | ICD-10-CM | POA: Diagnosis present

## 2015-02-04 DIAGNOSIS — I252 Old myocardial infarction: Secondary | ICD-10-CM

## 2015-02-04 DIAGNOSIS — Z794 Long term (current) use of insulin: Secondary | ICD-10-CM

## 2015-02-04 DIAGNOSIS — R4182 Altered mental status, unspecified: Secondary | ICD-10-CM | POA: Diagnosis not present

## 2015-02-04 DIAGNOSIS — G931 Anoxic brain damage, not elsewhere classified: Secondary | ICD-10-CM | POA: Diagnosis not present

## 2015-02-04 DIAGNOSIS — J9601 Acute respiratory failure with hypoxia: Secondary | ICD-10-CM | POA: Diagnosis not present

## 2015-02-04 DIAGNOSIS — G40909 Epilepsy, unspecified, not intractable, without status epilepticus: Secondary | ICD-10-CM | POA: Diagnosis not present

## 2015-02-04 DIAGNOSIS — T783XXA Angioneurotic edema, initial encounter: Secondary | ICD-10-CM

## 2015-02-04 DIAGNOSIS — N186 End stage renal disease: Secondary | ICD-10-CM | POA: Diagnosis present

## 2015-02-04 DIAGNOSIS — F329 Major depressive disorder, single episode, unspecified: Secondary | ICD-10-CM | POA: Diagnosis present

## 2015-02-04 DIAGNOSIS — D631 Anemia in chronic kidney disease: Secondary | ICD-10-CM | POA: Diagnosis not present

## 2015-02-04 DIAGNOSIS — T8241XA Breakdown (mechanical) of vascular dialysis catheter, initial encounter: Secondary | ICD-10-CM | POA: Diagnosis present

## 2015-02-04 DIAGNOSIS — I350 Nonrheumatic aortic (valve) stenosis: Secondary | ICD-10-CM | POA: Diagnosis present

## 2015-02-04 DIAGNOSIS — R4701 Aphasia: Secondary | ICD-10-CM | POA: Diagnosis not present

## 2015-02-04 DIAGNOSIS — G8191 Hemiplegia, unspecified affecting right dominant side: Secondary | ICD-10-CM | POA: Diagnosis present

## 2015-02-04 DIAGNOSIS — Z7902 Long term (current) use of antithrombotics/antiplatelets: Secondary | ICD-10-CM | POA: Diagnosis not present

## 2015-02-04 DIAGNOSIS — I2510 Atherosclerotic heart disease of native coronary artery without angina pectoris: Secondary | ICD-10-CM | POA: Diagnosis present

## 2015-02-04 DIAGNOSIS — N2581 Secondary hyperparathyroidism of renal origin: Secondary | ICD-10-CM | POA: Diagnosis not present

## 2015-02-04 DIAGNOSIS — H9190 Unspecified hearing loss, unspecified ear: Secondary | ICD-10-CM | POA: Diagnosis present

## 2015-02-04 DIAGNOSIS — F22 Delusional disorders: Secondary | ICD-10-CM

## 2015-02-04 DIAGNOSIS — Z8249 Family history of ischemic heart disease and other diseases of the circulatory system: Secondary | ICD-10-CM

## 2015-02-04 DIAGNOSIS — D638 Anemia in other chronic diseases classified elsewhere: Secondary | ICD-10-CM | POA: Diagnosis present

## 2015-02-04 DIAGNOSIS — I255 Ischemic cardiomyopathy: Secondary | ICD-10-CM | POA: Diagnosis present

## 2015-02-04 DIAGNOSIS — R1310 Dysphagia, unspecified: Secondary | ICD-10-CM | POA: Diagnosis not present

## 2015-02-04 DIAGNOSIS — J9600 Acute respiratory failure, unspecified whether with hypoxia or hypercapnia: Secondary | ICD-10-CM | POA: Diagnosis not present

## 2015-02-04 DIAGNOSIS — Z9981 Dependence on supplemental oxygen: Secondary | ICD-10-CM

## 2015-02-04 DIAGNOSIS — E440 Moderate protein-calorie malnutrition: Secondary | ICD-10-CM | POA: Diagnosis not present

## 2015-02-04 DIAGNOSIS — K219 Gastro-esophageal reflux disease without esophagitis: Secondary | ICD-10-CM | POA: Diagnosis present

## 2015-02-04 DIAGNOSIS — G9341 Metabolic encephalopathy: Secondary | ICD-10-CM | POA: Diagnosis not present

## 2015-02-04 DIAGNOSIS — I120 Hypertensive chronic kidney disease with stage 5 chronic kidney disease or end stage renal disease: Secondary | ICD-10-CM | POA: Diagnosis not present

## 2015-02-04 DIAGNOSIS — T82868A Thrombosis of vascular prosthetic devices, implants and grafts, initial encounter: Secondary | ICD-10-CM | POA: Diagnosis present

## 2015-02-04 DIAGNOSIS — I214 Non-ST elevation (NSTEMI) myocardial infarction: Secondary | ICD-10-CM | POA: Diagnosis not present

## 2015-02-04 DIAGNOSIS — I43 Cardiomyopathy in diseases classified elsewhere: Secondary | ICD-10-CM

## 2015-02-04 DIAGNOSIS — Z4659 Encounter for fitting and adjustment of other gastrointestinal appliance and device: Secondary | ICD-10-CM

## 2015-02-04 DIAGNOSIS — I110 Hypertensive heart disease with heart failure: Secondary | ICD-10-CM | POA: Diagnosis not present

## 2015-02-04 DIAGNOSIS — Z8673 Personal history of transient ischemic attack (TIA), and cerebral infarction without residual deficits: Secondary | ICD-10-CM | POA: Diagnosis not present

## 2015-02-04 DIAGNOSIS — I5022 Chronic systolic (congestive) heart failure: Secondary | ICD-10-CM | POA: Diagnosis present

## 2015-02-04 DIAGNOSIS — I272 Other secondary pulmonary hypertension: Secondary | ICD-10-CM | POA: Diagnosis not present

## 2015-02-04 DIAGNOSIS — I132 Hypertensive heart and chronic kidney disease with heart failure and with stage 5 chronic kidney disease, or end stage renal disease: Secondary | ICD-10-CM | POA: Diagnosis not present

## 2015-02-04 DIAGNOSIS — T783XXD Angioneurotic edema, subsequent encounter: Secondary | ICD-10-CM | POA: Diagnosis not present

## 2015-02-04 DIAGNOSIS — Z9109 Other allergy status, other than to drugs and biological substances: Secondary | ICD-10-CM | POA: Diagnosis not present

## 2015-02-04 DIAGNOSIS — T783XXS Angioneurotic edema, sequela: Secondary | ICD-10-CM | POA: Diagnosis not present

## 2015-02-04 DIAGNOSIS — R0600 Dyspnea, unspecified: Secondary | ICD-10-CM

## 2015-02-04 DIAGNOSIS — Z91041 Radiographic dye allergy status: Secondary | ICD-10-CM

## 2015-02-04 DIAGNOSIS — Z931 Gastrostomy status: Secondary | ICD-10-CM

## 2015-02-04 DIAGNOSIS — Z515 Encounter for palliative care: Secondary | ICD-10-CM | POA: Diagnosis not present

## 2015-02-04 DIAGNOSIS — J9690 Respiratory failure, unspecified, unspecified whether with hypoxia or hypercapnia: Secondary | ICD-10-CM | POA: Insufficient documentation

## 2015-02-04 DIAGNOSIS — R400 Somnolence: Secondary | ICD-10-CM | POA: Diagnosis not present

## 2015-02-04 DIAGNOSIS — R404 Transient alteration of awareness: Secondary | ICD-10-CM | POA: Diagnosis not present

## 2015-02-04 DIAGNOSIS — R7989 Other specified abnormal findings of blood chemistry: Secondary | ICD-10-CM | POA: Diagnosis not present

## 2015-02-04 DIAGNOSIS — I25111 Atherosclerotic heart disease of native coronary artery with angina pectoris with documented spasm: Secondary | ICD-10-CM | POA: Insufficient documentation

## 2015-02-04 HISTORY — PX: PERIPHERAL VASCULAR CATHETERIZATION: SHX172C

## 2015-02-04 HISTORY — DX: Nonrheumatic aortic (valve) stenosis: I35.0

## 2015-02-04 LAB — CBC
HEMATOCRIT: 36 % (ref 35.0–47.0)
Hemoglobin: 11.8 g/dL — ABNORMAL LOW (ref 12.0–16.0)
MCH: 28.9 pg (ref 26.0–34.0)
MCHC: 32.9 g/dL (ref 32.0–36.0)
MCV: 87.8 fL (ref 80.0–100.0)
Platelets: 119 10*3/uL — ABNORMAL LOW (ref 150–440)
RBC: 4.1 MIL/uL (ref 3.80–5.20)
RDW: 21.6 % — AB (ref 11.5–14.5)
WBC: 4.1 10*3/uL (ref 3.6–11.0)

## 2015-02-04 LAB — BLOOD GAS, ARTERIAL
Acid-Base Excess: 1.2 mmol/L (ref 0.0–3.0)
Allens test (pass/fail): POSITIVE — AB
Bicarbonate: 25 mEq/L (ref 21.0–28.0)
FIO2: 0.3
LHR: 14 {breaths}/min
O2 Saturation: 99.1 %
PEEP/CPAP: 5 cmH2O
PO2 ART: 132 mmHg — AB (ref 83.0–108.0)
Patient temperature: 37
VT: 450 mL
pCO2 arterial: 36 mmHg (ref 32.0–48.0)
pH, Arterial: 7.45 (ref 7.350–7.450)

## 2015-02-04 LAB — CREATININE, SERUM
CREATININE: 7.97 mg/dL — AB (ref 0.44–1.00)
GFR calc Af Amer: 5 mL/min — ABNORMAL LOW (ref >60)
GFR, EST NON AFRICAN AMERICAN: 5 mL/min — AB (ref >60)

## 2015-02-04 LAB — GLUCOSE, CAPILLARY
Glucose-Capillary: 162 mg/dL — ABNORMAL HIGH (ref 65–99)
Glucose-Capillary: 189 mg/dL — ABNORMAL HIGH (ref 65–99)

## 2015-02-04 LAB — POTASSIUM (ARMC VASCULAR LAB ONLY): Potassium (ARMC vascular lab): 3.9 (ref 3.5–5.1)

## 2015-02-04 LAB — MRSA PCR SCREENING: MRSA BY PCR: NEGATIVE

## 2015-02-04 LAB — TRIGLYCERIDES: Triglycerides: 734 mg/dL — ABNORMAL HIGH (ref <150)

## 2015-02-04 SURGERY — A/V SHUNTOGRAM/FISTULAGRAM
Anesthesia: Moderate Sedation | Laterality: Left

## 2015-02-04 MED ORDER — HEPARIN SODIUM (PORCINE) 1000 UNIT/ML IJ SOLN
INTRAMUSCULAR | Status: DC | PRN
  Administered 2015-02-04: 4000 [IU] via INTRAVENOUS

## 2015-02-04 MED ORDER — PROMETHAZINE HCL 25 MG PO TABS: 25.0000 mg | ORAL_TABLET | Freq: Four times a day (QID) | ORAL | Status: DC | PRN

## 2015-02-04 MED ORDER — DIPHENHYDRAMINE HCL 50 MG/ML IJ SOLN
12.5000 mg | Freq: Once | INTRAMUSCULAR | Status: AC
  Administered 2015-02-04: 12.5 mg via INTRAVENOUS

## 2015-02-04 MED ORDER — ATORVASTATIN CALCIUM 20 MG PO TABS: 80.0000 mg | ORAL_TABLET | Freq: Every day | ORAL | Status: DC

## 2015-02-04 MED ORDER — HEPARIN SODIUM (PORCINE) 1000 UNIT/ML IJ SOLN
INTRAMUSCULAR | Status: AC
  Filled 2015-02-04: qty 1

## 2015-02-04 MED ORDER — FENTANYL CITRATE (PF) 100 MCG/2ML IJ SOLN
INTRAMUSCULAR | Status: DC | PRN
  Administered 2015-02-04 (×3): 50 ug via INTRAVENOUS

## 2015-02-04 MED ORDER — ACETAMINOPHEN 325 MG PO TABS: 325.0000 mg | ORAL_TABLET | ORAL | Status: DC | PRN

## 2015-02-04 MED ORDER — MORPHINE SULFATE (PF) 4 MG/ML IV SOLN: 2.0000 mg | INTRAVENOUS | Status: DC | PRN

## 2015-02-04 MED ORDER — INSULIN ASPART 100 UNIT/ML ~~LOC~~ SOLN
0.0000 [IU] | SUBCUTANEOUS | Status: DC
  Administered 2015-02-04: 3 [IU] via SUBCUTANEOUS
  Administered 2015-02-05 (×3): 2 [IU] via SUBCUTANEOUS
  Administered 2015-02-05 – 2015-02-06 (×2): 3 [IU] via SUBCUTANEOUS
  Administered 2015-02-06 (×3): 5 [IU] via SUBCUTANEOUS
  Administered 2015-02-06 (×2): 3 [IU] via SUBCUTANEOUS
  Administered 2015-02-07: 5 [IU] via SUBCUTANEOUS
  Administered 2015-02-07 (×3): 3 [IU] via SUBCUTANEOUS
  Administered 2015-02-07: 5 [IU] via SUBCUTANEOUS
  Administered 2015-02-07 – 2015-02-08 (×2): 3 [IU] via SUBCUTANEOUS
  Administered 2015-02-08: 5 [IU] via SUBCUTANEOUS
  Administered 2015-02-08: 8 [IU] via SUBCUTANEOUS
  Administered 2015-02-08: 5 [IU] via SUBCUTANEOUS
  Administered 2015-02-08: 2 [IU] via SUBCUTANEOUS
  Administered 2015-02-08 – 2015-02-09 (×2): 3 [IU] via SUBCUTANEOUS
  Administered 2015-02-09 (×2): 2 [IU] via SUBCUTANEOUS
  Administered 2015-02-09: 5 [IU] via SUBCUTANEOUS
  Administered 2015-02-09: 3 [IU] via SUBCUTANEOUS
  Administered 2015-02-09: 2 [IU] via SUBCUTANEOUS
  Administered 2015-02-10 (×2): 3 [IU] via SUBCUTANEOUS
  Administered 2015-02-10: 2 [IU] via SUBCUTANEOUS
  Administered 2015-02-10: 3 [IU] via SUBCUTANEOUS
  Administered 2015-02-11: 2 [IU] via SUBCUTANEOUS
  Administered 2015-02-11 (×5): 3 [IU] via SUBCUTANEOUS
  Administered 2015-02-11: 5 [IU] via SUBCUTANEOUS
  Administered 2015-02-12: 3 [IU] via SUBCUTANEOUS
  Administered 2015-02-12 (×2): 5 [IU] via SUBCUTANEOUS
  Administered 2015-02-12: 2 [IU] via SUBCUTANEOUS
  Administered 2015-02-13 (×4): 3 [IU] via SUBCUTANEOUS
  Administered 2015-02-13: 5 [IU] via SUBCUTANEOUS
  Administered 2015-02-14 (×2): 2 [IU] via SUBCUTANEOUS
  Administered 2015-02-14 – 2015-02-15 (×5): 3 [IU] via SUBCUTANEOUS
  Administered 2015-02-15: 2 [IU] via SUBCUTANEOUS
  Administered 2015-02-15: 3 [IU] via SUBCUTANEOUS
  Administered 2015-02-16: 2 [IU] via SUBCUTANEOUS
  Administered 2015-02-16: 3 [IU] via SUBCUTANEOUS
  Administered 2015-02-16 (×2): 2 [IU] via SUBCUTANEOUS
  Administered 2015-02-17 – 2015-02-18 (×4): 3 [IU] via SUBCUTANEOUS
  Administered 2015-02-18: 2 [IU] via SUBCUTANEOUS
  Administered 2015-02-18: 3 [IU] via SUBCUTANEOUS
  Administered 2015-02-18 – 2015-02-19 (×2): 2 [IU] via SUBCUTANEOUS
  Administered 2015-02-19: 3 [IU] via SUBCUTANEOUS
  Administered 2015-02-19: 5 [IU] via SUBCUTANEOUS
  Administered 2015-02-19 – 2015-02-20 (×2): 2 [IU] via SUBCUTANEOUS
  Administered 2015-02-20: 11 [IU] via SUBCUTANEOUS
  Administered 2015-02-20: 5 [IU] via SUBCUTANEOUS
  Administered 2015-02-20 – 2015-02-21 (×3): 3 [IU] via SUBCUTANEOUS
  Administered 2015-02-21 (×2): 2 [IU] via SUBCUTANEOUS
  Administered 2015-02-21 (×2): 3 [IU] via SUBCUTANEOUS
  Administered 2015-02-22: 5 [IU] via SUBCUTANEOUS
  Administered 2015-02-22 – 2015-02-23 (×4): 2 [IU] via SUBCUTANEOUS
  Administered 2015-02-23: 3 [IU] via SUBCUTANEOUS
  Administered 2015-02-23: 5 [IU] via SUBCUTANEOUS
  Filled 2015-02-04 (×2): qty 2
  Filled 2015-02-04 (×2): qty 3
  Filled 2015-02-04: qty 5
  Filled 2015-02-04: qty 2
  Filled 2015-02-04: qty 3
  Filled 2015-02-04: qty 2
  Filled 2015-02-04: qty 5
  Filled 2015-02-04 (×3): qty 3
  Filled 2015-02-04: qty 2
  Filled 2015-02-04: qty 3
  Filled 2015-02-04: qty 5
  Filled 2015-02-04: qty 8
  Filled 2015-02-04 (×2): qty 5
  Filled 2015-02-04: qty 3
  Filled 2015-02-04 (×3): qty 5
  Filled 2015-02-04 (×2): qty 2
  Filled 2015-02-04 (×2): qty 3
  Filled 2015-02-04: qty 5
  Filled 2015-02-04: qty 3
  Filled 2015-02-04: qty 2
  Filled 2015-02-04 (×3): qty 3
  Filled 2015-02-04: qty 5
  Filled 2015-02-04: qty 11
  Filled 2015-02-04: qty 3
  Filled 2015-02-04: qty 2
  Filled 2015-02-04: qty 3
  Filled 2015-02-04: qty 2
  Filled 2015-02-04 (×2): qty 3
  Filled 2015-02-04: qty 5
  Filled 2015-02-04 (×2): qty 2
  Filled 2015-02-04: qty 3
  Filled 2015-02-04: qty 5
  Filled 2015-02-04 (×2): qty 2
  Filled 2015-02-04: qty 5
  Filled 2015-02-04 (×4): qty 3
  Filled 2015-02-04 (×3): qty 2
  Filled 2015-02-04: qty 3
  Filled 2015-02-04: qty 2
  Filled 2015-02-04: qty 5
  Filled 2015-02-04: qty 2
  Filled 2015-02-04 (×2): qty 3
  Filled 2015-02-04: qty 2
  Filled 2015-02-04 (×2): qty 3
  Filled 2015-02-04: qty 2
  Filled 2015-02-04 (×2): qty 3
  Filled 2015-02-04 (×2): qty 2
  Filled 2015-02-04 (×2): qty 3
  Filled 2015-02-04 (×2): qty 2
  Filled 2015-02-04 (×3): qty 3
  Filled 2015-02-04: qty 2
  Filled 2015-02-04 (×2): qty 3
  Filled 2015-02-04: qty 2
  Filled 2015-02-04 (×3): qty 3
  Filled 2015-02-04: qty 5
  Filled 2015-02-04 (×3): qty 3
  Filled 2015-02-04: qty 2
  Filled 2015-02-04: qty 5

## 2015-02-04 MED ORDER — HYDRALAZINE HCL 20 MG/ML IJ SOLN
20.0000 mg | INTRAMUSCULAR | Status: DC | PRN
  Administered 2015-02-04 – 2015-02-06 (×2): 20 mg via INTRAVENOUS
  Filled 2015-02-04 (×2): qty 1

## 2015-02-04 MED ORDER — LABETALOL HCL 5 MG/ML IV SOLN: 10.0000 mg | INTRAVENOUS | Status: DC | PRN

## 2015-02-04 MED ORDER — METOPROLOL TARTRATE 1 MG/ML IV SOLN: 2.0000 mg | INTRAVENOUS | Status: DC | PRN

## 2015-02-04 MED ORDER — FENTANYL CITRATE (PF) 100 MCG/2ML IJ SOLN
INTRAMUSCULAR | Status: AC
  Filled 2015-02-04: qty 2

## 2015-02-04 MED ORDER — SODIUM CHLORIDE 0.9 % IV SOLN: 500.0000 mL | Freq: Once | INTRAVENOUS | Status: DC | PRN

## 2015-02-04 MED ORDER — MIDAZOLAM HCL 2 MG/2ML IJ SOLN
INTRAMUSCULAR | Status: DC | PRN
  Administered 2015-02-04 (×2): 1 mg via INTRAVENOUS
  Administered 2015-02-04: 2 mg via INTRAVENOUS

## 2015-02-04 MED ORDER — METHYLPREDNISOLONE SODIUM SUCC 125 MG IJ SOLR
INTRAMUSCULAR | Status: AC
  Filled 2015-02-04: qty 2

## 2015-02-04 MED ORDER — VITAMIN C 500 MG PO TABS
500.0000 mg | ORAL_TABLET | Freq: Two times a day (BID) | ORAL | Status: DC
  Administered 2015-02-04 – 2015-02-08 (×9): 500 mg via ORAL
  Filled 2015-02-04 (×9): qty 1

## 2015-02-04 MED ORDER — PROPOFOL 1000 MG/100ML IV EMUL
0.0000 ug/kg/min | INTRAVENOUS | Status: DC
  Administered 2015-02-04: 15 ug/kg/min via INTRAVENOUS
  Administered 2015-02-04: 50 ug/kg/min via INTRAVENOUS
  Administered 2015-02-05: 45 ug/kg/min via INTRAVENOUS
  Administered 2015-02-05: 30 ug/kg/min via INTRAVENOUS
  Administered 2015-02-06: 25.244 ug/kg/min via INTRAVENOUS
  Administered 2015-02-06 – 2015-02-08 (×2): 20 ug/kg/min via INTRAVENOUS
  Filled 2015-02-04 (×8): qty 100

## 2015-02-04 MED ORDER — PANTOPRAZOLE SODIUM 40 MG PO TBEC
40.0000 mg | DELAYED_RELEASE_TABLET | Freq: Every day | ORAL | Status: DC
  Administered 2015-02-05: 40 mg via ORAL
  Filled 2015-02-04: qty 1

## 2015-02-04 MED ORDER — OXYCODONE-ACETAMINOPHEN 5-325 MG PO TABS: 1.0000 | ORAL_TABLET | ORAL | Status: DC | PRN

## 2015-02-04 MED ORDER — PROPOFOL 1000 MG/100ML IV EMUL
INTRAVENOUS | Status: AC
  Filled 2015-02-04: qty 100

## 2015-02-04 MED ORDER — ASPIRIN EC 81 MG PO TBEC: 81.0000 mg | DELAYED_RELEASE_TABLET | Freq: Every day | ORAL | Status: DC

## 2015-02-04 MED ORDER — MIDAZOLAM HCL 5 MG/5ML IJ SOLN
INTRAMUSCULAR | Status: AC
  Filled 2015-02-04: qty 5

## 2015-02-04 MED ORDER — ALTEPLASE 2 MG IJ SOLR
INTRAMUSCULAR | Status: AC
  Filled 2015-02-04: qty 4

## 2015-02-04 MED ORDER — LIDOCAINE-EPINEPHRINE (PF) 1 %-1:200000 IJ SOLN
INTRAMUSCULAR | Status: DC | PRN
  Administered 2015-02-04: 10 mL

## 2015-02-04 MED ORDER — ALUM & MAG HYDROXIDE-SIMETH 200-200-20 MG/5ML PO SUSP
15.0000 mL | ORAL | Status: DC | PRN
Start: 2015-02-04 — End: 2015-02-08

## 2015-02-04 MED ORDER — LIDOCAINE-EPINEPHRINE (PF) 1 %-1:200000 IJ SOLN
INTRAMUSCULAR | Status: AC
  Filled 2015-02-04: qty 30

## 2015-02-04 MED ORDER — CLOPIDOGREL BISULFATE 75 MG PO TABS
75.0000 mg | ORAL_TABLET | Freq: Every day | ORAL | Status: DC
  Administered 2015-02-04 – 2015-02-08 (×5): 75 mg via ORAL
  Filled 2015-02-04 (×5): qty 1

## 2015-02-04 MED ORDER — DEXTROSE 5 % IV SOLN
1.5000 g | Freq: Once | INTRAVENOUS | Status: AC
  Administered 2015-02-04: 1.5 g via INTRAVENOUS

## 2015-02-04 MED ORDER — INSULIN ASPART 100 UNIT/ML ~~LOC~~ SOLN: 0.0000 [IU] | SUBCUTANEOUS | Status: DC

## 2015-02-04 MED ORDER — HEPARIN (PORCINE) IN NACL 2-0.9 UNIT/ML-% IJ SOLN
INTRAMUSCULAR | Status: AC
  Filled 2015-02-04: qty 1000

## 2015-02-04 MED ORDER — ASPIRIN 81 MG PO CHEW
81.0000 mg | CHEWABLE_TABLET | Freq: Every day | ORAL | Status: DC
  Administered 2015-02-04 – 2015-02-23 (×17): 81 mg
  Filled 2015-02-04 (×17): qty 1

## 2015-02-04 MED ORDER — ATORVASTATIN CALCIUM 20 MG PO TABS
40.0000 mg | ORAL_TABLET | Freq: Every day | ORAL | Status: DC
  Administered 2015-02-05 – 2015-02-09 (×5): 40 mg via ORAL
  Filled 2015-02-04 (×5): qty 2

## 2015-02-04 MED ORDER — METHYLPREDNISOLONE SODIUM SUCC 125 MG IJ SOLR
125.0000 mg | Freq: Once | INTRAMUSCULAR | Status: AC
  Administered 2015-02-04: 125 mg via INTRAVENOUS

## 2015-02-04 MED ORDER — GUAIFENESIN-DM 100-10 MG/5ML PO SYRP: 15.0000 mL | ORAL_SOLUTION | ORAL | Status: DC | PRN

## 2015-02-04 MED ORDER — DIPHENHYDRAMINE HCL 50 MG/ML IJ SOLN
25.0000 mg | Freq: Three times a day (TID) | INTRAMUSCULAR | Status: DC
  Administered 2015-02-04 – 2015-02-08 (×12): 25 mg via INTRAVENOUS
  Filled 2015-02-04 (×12): qty 1

## 2015-02-04 MED ORDER — DEXAMETHASONE SODIUM PHOSPHATE 10 MG/ML IJ SOLN
10.0000 mg | Freq: Four times a day (QID) | INTRAMUSCULAR | Status: DC
  Administered 2015-02-04 – 2015-02-08 (×15): 10 mg via INTRAVENOUS
  Filled 2015-02-04 (×16): qty 1

## 2015-02-04 MED ORDER — PHENOL 1.4 % MT LIQD: 1.0000 | OROMUCOSAL | Status: DC | PRN

## 2015-02-04 MED ORDER — HYDRALAZINE HCL 20 MG/ML IJ SOLN: 5.0000 mg | INTRAMUSCULAR | Status: DC | PRN

## 2015-02-04 MED ORDER — ACETAMINOPHEN 325 MG RE SUPP: 325.0000 mg | RECTAL | Status: DC | PRN

## 2015-02-04 MED ORDER — HEPARIN SODIUM (PORCINE) 5000 UNIT/ML IJ SOLN
5000.0000 [IU] | Freq: Three times a day (TID) | INTRAMUSCULAR | Status: DC
  Administered 2015-02-04 – 2015-02-06 (×6): 5000 [IU] via SUBCUTANEOUS
  Filled 2015-02-04 (×6): qty 1

## 2015-02-04 MED ORDER — DIPHENHYDRAMINE HCL 50 MG/ML IJ SOLN
INTRAMUSCULAR | Status: AC
  Filled 2015-02-04: qty 1

## 2015-02-04 MED ORDER — IOHEXOL 300 MG/ML  SOLN
INTRAMUSCULAR | Status: DC | PRN
  Administered 2015-02-04: 25 mL via INTRAVENOUS

## 2015-02-04 MED ORDER — SODIUM CHLORIDE 0.9 % IV SOLN
INTRAVENOUS | Status: DC
  Administered 2015-02-04: 10:00:00 via INTRAVENOUS

## 2015-02-04 MED ORDER — METOPROLOL TARTRATE 25 MG PO TABS
25.0000 mg | ORAL_TABLET | Freq: Two times a day (BID) | ORAL | Status: DC
  Administered 2015-02-05: 25 mg via ORAL
  Filled 2015-02-04 (×2): qty 1

## 2015-02-04 MED ORDER — ONDANSETRON HCL 4 MG/2ML IJ SOLN: 4.0000 mg | Freq: Four times a day (QID) | INTRAMUSCULAR | Status: DC | PRN

## 2015-02-04 MED FILL — Medication: Qty: 1 | Status: AC

## 2015-02-04 SURGICAL SUPPLY — 17 items
BALLN DORADO 7X80X80 (BALLOONS) ×3
BALLOON DORADO 7X80X80 (BALLOONS) ×1 IMPLANT
CANNULA 5F STIFF (CANNULA) ×3 IMPLANT
CATH 5F KA2 (CATHETERS) ×3 IMPLANT
CATH EMBOLECTOMY 5FR (BALLOONS) ×3 IMPLANT
DEVICE PRESTO INFLATION (MISCELLANEOUS) ×3 IMPLANT
DRAPE BRACHIAL (DRAPES) ×3 IMPLANT
PACK ANGIOGRAPHY (CUSTOM PROCEDURE TRAY) ×3 IMPLANT
SET AVX THROMB ULT (MISCELLANEOUS) ×3 IMPLANT
SHEATH BRITE TIP 6FRX5.5 (SHEATH) ×6 IMPLANT
SHEATH BRITE TIP 7FRX5.5 (SHEATH) ×3 IMPLANT
STENT VIABAHN 8X150X120 (Permanent Stent) ×2 IMPLANT
STENT VIABAHN 8X15X120 7FR (Permanent Stent) ×1 IMPLANT
SUT MNCRL AB 4-0 PS2 18 (SUTURE) ×3 IMPLANT
TOWEL OR 17X26 4PK STRL BLUE (TOWEL DISPOSABLE) ×3 IMPLANT
WIRE G V18X300CM (WIRE) ×3 IMPLANT
WIRE MAGIC TOR.035 180C (WIRE) ×6 IMPLANT

## 2015-02-04 NOTE — Consult Note (Signed)
[facility] Pulmonary Medicine Consultation      Name: [name] MRN: [id number] DOB: September 13, 1945    ADMISSION DATE:  02/04/2015  CHIEF COMPLAINT:    acute resp failure   HISTORY OF PRESENT ILLNESS  69 yo AAf with ESRD on HD s/p fistulagram repair was in specials unit when a code blue was called for resp distress, patient with acute angioedema, emergently intubated, placed on sedation and full vent support  SIGNIFICANT EVENTS  11/16 intubated   PAST MEDICAL HISTORY    :  Past Medical History  Diagnosis Date  . Stroke (HCC)   . MI (myocardial infarction) (HCC)   . Hearing impaired   . ESRD (end stage renal disease) (HCC)   . Hyperlipidemia   . Hypertension   . Diabetes mellitus without complication (HCC)   . Depression   . Coronary artery disease   . Dementia   . Secondary hyperparathyroidism (HCC)   . Dialysis patient [facility])     Monday, Wednesday and Friday.   . Chronic systolic CHF (congestive heart failure) (HCC)   . GERD (gastroesophageal reflux disease)   . Personal history of transient ischemic attack (TIA) and cerebral infarction without residual deficit   . Anemia    Past Surgical History  Procedure Laterality Date  . Cataract extraction, bilateral    . Wrist surgery    . Peripheral vascular catheterization Left 07/28/2014    Procedure: A/V Shuntogram/Fistulagram;  Surgeon: Renford Dills, MD;  Location: [facility] INVASIVE CV LAB;  Service: Cardiovascular;  Laterality: Left;  . Peripheral vascular catheterization Left 07/28/2014    Procedure: A/V Shunt Intervention;  Surgeon: Renford Dills, MD;  Location: [facility] INVASIVE CV LAB;  Service: Cardiovascular;  Laterality: Left;   Prior to Admission medications   Medication Sig Start Date End Date Taking? Authorizing Provider  aspirin EC 81 MG tablet Take 81 mg by mouth daily.   Yes Historical Provider, MD  atorvastatin (LIPITOR) 40 MG tablet Take 40 mg by mouth daily at 6 PM.   Yes Historical Provider, MD    calcium acetate (PHOSLO) 667 MG capsule Take 1,334 mg by mouth 3 (three) times daily with meals.    Yes Historical Provider, MD  clopidogrel (PLAVIX) 75 MG tablet Take 1 tablet (75 mg total) by mouth daily. 09/07/14  Yes Antonieta Iba, MD  COD LIVER OIL PO Take 530 mg by mouth daily.   Yes Historical Provider, MD  docusate sodium (COLACE) 100 MG capsule Take 100 mg by mouth at bedtime.    Yes Historical Provider, MD  hydrALAZINE (APRESOLINE) 25 MG tablet Take 1 tablet (25 mg total) by mouth 3 (three) times daily. Patient taking differently: Take 25 mg by mouth 2 (two) times daily.  07/17/14  Yes Antonieta Iba, MD  isosorbide mononitrate (IMDUR) 30 MG 24 hr tablet Take 1 tablet (30 mg total) by mouth daily. 08/11/14  Yes Antonieta Iba, MD  lactobacillus acidophilus (BACID) TABS tablet Take 1 tablet by mouth at bedtime.   Yes Historical Provider, MD  lisinopril (PRINIVIL,ZESTRIL) 5 MG tablet Take 5 mg by mouth daily.   Yes Historical Provider, MD  metoprolol tartrate (LOPRESSOR) 25 MG tablet Take 25 mg by mouth 2 (two) times daily.   Yes Historical Provider, MD  Omega-3 Fatty Acids (FISH OIL) 500 MG CAPS Take 500 mg by mouth 2 (two) times daily.   Yes Historical Provider, MD  ranitidine (ZANTAC) 150 MG capsule Take 1 capsule (150 mg total) by mouth every  evening. Patient taking differently: Take 150 mg by mouth daily.  10/02/14  Yes Amy Rusty AusLauren Krebs, NP  Vitamin D, Ergocalciferol, (DRISDOL) 50000 UNITS CAPS capsule Take 50,000 Units by mouth every 30 (thirty) days. Pt takes on the 7th of every month.   Yes Historical Provider, MD  atorvastatin (LIPITOR) 80 MG tablet Take 1 tablet (80 mg total) by mouth daily. 08/11/14   Antonieta Ibaimothy J Gollan, MD  insulin aspart (NOVOLOG) 100 UNIT/ML injection Inject 3-13 Units into the skin 3 (three) times daily with meals. Sliding scale    Historical Provider, MD  lisinopril (PRINIVIL,ZESTRIL) 20 MG tablet Take 1 tablet (20 mg total) by mouth daily. 07/16/14   Antonieta Ibaimothy  J Gollan, MD  LORazepam (ATIVAN) 2 MG/ML concentrated solution Take 2 mg by mouth as needed for anxiety.    Historical Provider, MD  Nutritional Supplements (FEEDING SUPPLEMENT, NEPRO CARB STEADY,) LIQD Take 237 mLs by mouth 2 (two) times daily between meals. 12/05/14   Gale Journeyatherine P Walsh, MD  promethazine (PHENERGAN) 25 MG tablet Take 25 mg by mouth every 6 (six) hours as needed for nausea or vomiting.    Historical Provider, MD  vitamin C (ASCORBIC ACID) 500 MG tablet Take 500 mg by mouth 2 (two) times daily.     Historical Provider, MD   Allergies  Allergen Reactions  . Oxygen Swelling and Rash    Capnography line oxygen tubing monitor     FAMILY HISTORY   Family History  Problem Relation Age of Onset  . Family history unknown: Yes      SOCIAL HISTORY    reports that she has never smoked. She does not have any smokeless tobacco history on file. She reports that she does not drink alcohol or use illicit drugs.  Review of Systems  Unable to perform ROS: critical illness      VITAL SIGNS    Temp:  [95.5 F (35.3 C)-97.8 F (36.6 C)] 95.5 F (35.3 C) (11/17 1310) Pulse Rate:  [54-80] 80 (11/17 1300) Resp:  [11-24] 23 (11/17 1310) BP: (114-173)/(67-115) 173/115 mmHg (11/17 1300) SpO2:  [89 %-100 %] 100 % (11/17 1300) Weight:  [128 lb (58.06 kg)] 128 lb (58.06 kg) (11/17 0934) HEMODYNAMICS:   VENTILATOR SETTINGS:   INTAKE / OUTPUT: No intake or output data in the 24 hours ending 02/04/15 1528     PHYSICAL EXAM   Physical Exam  Constitutional: No distress.  HENT:  Head: Normocephalic and atraumatic.  Swollen lips and tongue  Eyes: Pupils are equal, round, and reactive to light.  Neck: Normal range of motion. Neck supple.  Cardiovascular: Normal rate and regular rhythm.   No murmur heard. Pulmonary/Chest: No respiratory distress. She has no wheezes. She has no rales.  resp distress  Abdominal: Soft. Bowel sounds are normal. She exhibits no distension. There  is no tenderness.  Musculoskeletal: She exhibits no edema.  Neurological:  gcs<8T  Skin: Skin is warm. She is not diaphoretic.       LABS   LABS:  CBC No results for input(s): WBC, HGB, HCT, PLT in the last 168 hours. Coag's No results for input(s): APTT, INR in the last 168 hours. BMET No results for input(s): NA, K, CL, CO2, BUN, CREATININE, GLUCOSE in the last 168 hours. Electrolytes No results for input(s): CALCIUM, MG, PHOS in the last 168 hours. Sepsis Markers No results for input(s): LATICACIDVEN, PROCALCITON, O2SATVEN in the last 168 hours. ABG No results for input(s): PHART, PCO2ART, PO2ART in the last 168 hours.  Liver Enzymes No results for input(s): AST, ALT, ALKPHOS, BILITOT, ALBUMIN in the last 168 hours. Cardiac Enzymes No results for input(s): TROPONINI, PROBNP in the last 168 hours. Glucose  Recent Labs Lab 02/04/15 1347  GLUCAP 162*     No results found for this or any previous visit (from the past 240 hour(s)).   Current facility-administered medications:  .  0.9 %  sodium chloride infusion, 500 mL, Intravenous, Once PRN, Annice Needy, MD .  acetaminophen (TYLENOL) tablet 325-650 mg, 325-650 mg, Oral, Q4H PRN **OR** acetaminophen (TYLENOL) suppository 325-650 mg, 325-650 mg, Rectal, Q4H PRN, Annice Needy, MD .  alum & mag hydroxide-simeth (MAALOX/MYLANTA) 200-200-20 MG/5ML suspension 15-30 mL, 15-30 mL, Oral, Q2H PRN, Annice Needy, MD .  diphenhydrAMINE (BENADRYL) 50 MG/ML injection, , , ,  .  guaiFENesin-dextromethorphan (ROBITUSSIN DM) 100-10 MG/5ML syrup 15 mL, 15 mL, Oral, Q4H PRN, Annice Needy, MD .  hydrALAZINE (APRESOLINE) injection 5 mg, 5 mg, Intravenous, Q20 Min PRN, Annice Needy, MD .  labetalol (NORMODYNE,TRANDATE) injection 10 mg, 10 mg, Intravenous, Q10 min PRN, Annice Needy, MD .  methylPREDNISolone sodium succinate (SOLU-MEDROL) 125 mg/2 mL injection, , , ,  .  metoprolol (LOPRESSOR) injection 2-5 mg, 2-5 mg, Intravenous, Q2H PRN, Annice Needy, MD .  morphine 4 MG/ML injection 2-4 mg, 2-4 mg, Intravenous, Q1H PRN, Annice Needy, MD .  ondansetron (ZOFRAN) injection 4 mg, 4 mg, Intravenous, Q6H PRN, Annice Needy, MD .  oxyCODONE-acetaminophen (PERCOCET/ROXICET) 5-325 MG per tablet 1-2 tablet, 1-2 tablet, Oral, Q4H PRN, Annice Needy, MD .  phenol (CHLORASEPTIC) mouth spray 1 spray, 1 spray, Mouth/Throat, PRN, Annice Needy, MD .  propofol (DIPRIVAN) 1000 MG/100ML infusion, , , ,  .  propofol (DIPRIVAN) 1000 MG/100ML infusion, 5-80 mcg/kg/min, Intravenous, Titrated, Erin Fulling, MD, Last Rate: 5.2 mL/hr at 02/04/15 1401, 15 mcg/kg/min at 02/04/15 1401  IMAGING    X-ray Chest Pa Or Ap  02/04/2015  CLINICAL DATA:  Postop cardiac catheterization, the patient coded while in special procedures EXAM: CHEST 1 VIEW COMPARISON:  Portable chest x-ray of 12/02/2014 FINDINGS: The tip of the endotracheal tube is approximately 2.1 cm above the carina. The lungs appear well aerated. No infiltrate or effusion is seen. Cardiomegaly is stable. IMPRESSION: 1. Endotracheal tube tip 2.1 cm above the carina. 2. Improved aeration of the lungs. Electronically Signed   By: Dwyane Dee M.D.   On: 02/04/2015 13:26   Dg Abd 1 View  02/04/2015  CLINICAL DATA:  NG tube placement. EXAM: ABDOMEN - 1 VIEW COMPARISON:  12/21/2014 FINDINGS: The NG tube tip is in the fundal/ body region of the stomach. The bowel gas pattern is unremarkable. External pacer paddles are noted over the chest. IMPRESSION: NG tube tip is in the fundal/ body region of the stomach. Electronically Signed   By: Rudie Meyer M.D.   On: 02/04/2015 14:33    MAJOR EVENTS/TEST RESULTS: Thrombosed left arm AV graft s/p balloon angioplast  I  MICRO DATA: MRSA PCR  Urine  Blood Resp   ANTIMICROBIALS:     ASSESSMENT/PLAN   69 yo AAF with ESRD on HD with acute resp failure from acute angioedema ??etiology  PULMONARY-angioedema 1.Respiratory Failure -continue Full MV support -continue  Bronchodilator Therapy -Wean Fio2 and PEEP as tolerated -start decadron 10 TID -will attempt SAT/SBT in next 24 hrs   CARDIOVASCULAR Vasopressors as needed  RENAL Follow up Nephrology -follow up vasc surgery recs  GASTROINTESTINAL NG,  will assess TF in next 24 hrs  HEMATOLOGIC follow CBC  INFECTIOUS No signs of infection at this time  ENDOCRINE - ICU hypoglycemic\Hyperglycemia protocol   NEUROLOGIC - intubated and sedated - minimal sedation to achieve a RASS goal: -1    I have personally obtained a history, examined the patient, evaluated laboratory and independently reviewed  imaging results, formulated the assessment and plan and placed orders.  The Patient requires high complexity decision making for assessment and support, frequent evaluation and titration of therapies, application of advanced monitoring technologies and extensive interpretation of multiple databases. Critical Care Time devoted to patient care services described in this note is 45 minutes.   Overall, patient is critically ill, prognosis is guarded. Patient at high risk for cardiac arrest and death.    Lucie Leather, M.D.  [facility]  Medical Director [facility] Medical Director [facility] Cardio-Pulmonary Department

## 2015-02-04 NOTE — Progress Notes (Signed)
Patient now intubated and sedated in the ICU. Has significant facial and tongue swelling that was not present before the procedure. Now stable and with oxygen saturations 100% on the ventilator. Appears to have some sort of allergic reaction/angioedema. Initial suspicion was from the oxygen tubing in the postoperative recovery area but not entirely clear what the source is. Possible contrast reaction or other medication reaction. Have discussed the case with Dr. Belia HemanKasa from the critical care service. Agree with ventilator support, steroids, Benadryl, and trying to keep her head elevated to reduce the swelling. Tried to find the daughter to discuss the situation was unable to find her in the waiting area at this time. If she needs dialysis, her AV graft should be fine to use for her dialysis treatments.

## 2015-02-04 NOTE — OR Nursing (Signed)
Transferred to CCU

## 2015-02-04 NOTE — Progress Notes (Signed)
   02/04/15 1438  Clinical Encounter Type  Visited With Family  Visit Type Follow-up  Referral From Chaplain  Consult/Referral To Chaplain  Spiritual Encounters  Spiritual Needs Emotional  Stress Factors  Family Stress Factors Other (Comment)  Chaplain rounded in the unit and checked in with the family. Was on my way back to get an update and the nurse came out to escort family back to visit with patient. Chaplain Therma Lasure A. Kassia Demarinis Ext. [phone]

## 2015-02-04 NOTE — Op Note (Signed)
[facility]    OPERATIVE NOTE   PROCEDURE: 1.   Left brachial artery to cephalic vein loop forearm arteriovenous graft cannulation under ultrasound guidance in both a retrograde and then antegrade fashion crossing 2.   left arm shuntogram and central venogram 3.  Catheter directed thrombolysis with 4 mg of TPA delivered with the AngioJet AVX catheter 4.  Mechanical rheolytic thrombectomy to the  Left arm AV graft and cephalic vein beyond the graft with the AngioJet AVX catheter 5.  Fogarty embolectomy for residual arterial plug 6.  Percutaneous transluminal angioplasty of arterial anastomosis and proximal portion of the graft with 7 mm diameter by 8 cm length high pressure angioplasty balloon 7.  Percutaneous transluminal angioplasty of the distal graft and venous anastomosis with 2 inflations of the 7 mm diameter by 8 cm length high pressure angioplasty balloon 8.   Viabahn covered stent placement to the distal graft, venous anastomosis, and initial few centimeters of the cephalic vein with 16m diameter by 15 cm length Viabahn covered stent  PRE-OPERATIVE DIAGNOSIS: 1. ESRD 2.  Thrombosed  Left arm arteriovenous graft  POST-OPERATIVE DIAGNOSIS: same as above   SURGEON: JLeotis Pain MD  ANESTHESIA: local with MCS  ESTIMATED BLOOD LOSS:  25 cc  FINDING(S): 1.  near occlusive stenosis of the venous anastomosis, Nehring and thrombus within the graft both in the initial portions near the anastomosis as well as at the access sites  SPECIMEN(S):  None  CONTRAST:  50 cc  INDICATIONS: Patient is a  69year old female who presents with a thrombosed left arm arteriovenous graft.  The patient is scheduled for an attempted declot and shuntogram.  The patient is aware the risks include but are not limited to: bleeding, infection, thrombosis of the cannulated access, and possible anaphylactic reaction to the contrast.  The patient is aware of the risks of the procedure and  elects to proceed forward.  DESCRIPTION: After full informed written consent was obtained, the patient was brought back to the angiography suite and placed supine upon the angiography table.  The patient was connected to monitoring equipment.  The left arm was prepped and draped in the standard fashion for a percutaneous access intervention.  Under ultrasound guidance, the left brachial artery to cephalic vein loop forearm arteriovenous graft was cannulated with a micropuncture needle under direct ultrasound guidance due to the pulseless nature of the graft in both an antegrade and a retrograde fashion crossing, and permanent images were performed.  The microwire was advanced and the needle was exchanged for the a microsheath.  I then upsized to a 6 Fr Sheath and imaging was performed.  Hand injections were completed to image the access including the central venous system. This demonstrated no flow within the AV graft.  Based on the images, this patient will need extensive treatment to salvage the graft. I then gave the patient  4000 units of intravenous heparin.  I then placed a Magic torque wire into the brachial artery from the retrograde sheath and into the  Upper arm cephalic vein from the antegrade sheath.  Kumpe catheter were used to help facilitate wire placement.  4 mg of TPA were deployed throughout the entirety of the graft and into the cephalic vein. This was allowed to dwell for 10-15 minutes. Mechanical rheolytic thrombectomy was then performed throughout the graft and into the  cephalic vein.  About 90-100 cc of effluent was returned.This uncovered  Narrowing at the access sites with residual thrombus as  well as a high-grade near occlusive stenosis of the venous anastomosis and initial portion of the cephalic vein just beyond the previously placed stent.  A residual arterial plug was also seen at the arterial anastomosis. An attempt to clear the arterial plug was done with  2 passes of the  Fogarty embolectomy balloon. Flow-limiting arterial plug remained, and I elected to treat this lesion with a  7 mm diameter by 8 cm length high pressure angioplasty balloon inflated to 10 atm for 1 minute. This resulted in resolution of the arterial plug, and clearance of the arterial side of the graft. The arterial outflow was seen to be intact through the radial and ulnar arteries as well on these images. The retrograde sheath was removed. I then turned my attention to the thrombus in the distal graft and the cephalic vein. Mechanical rheolytic thrombectomy was performed. This resulted in  Improvement in the distal graft but thrombus still remained and there was still a high-grade stenosis at the venous anastomosis and into the cephalic vein.  I then elected to treat this with  Angioplasty. 2 inflations of the 7 mm diameter by 8 cm length high pressure angioplasty balloon were used in the distal graft and into the venous anastomosis and cephalic vein. Inflations were taken to between 14-16 atm and held for 1 minute. Significant residual stenosis particularly at the venous anastomosis and a lesser amount of thrombus in the distal graft was still seen. I upsized to a 7 [place] sheath and a 0.018 wire. I then deployed a Viabahn covered stent using an 8 mm diameter by 15 cm length stent in the distal portion of the graft across the venous anastomosis and into the first few centimeters of the cephalic vein. This was postdilated with a 7 mm balloon with excellent angiographic completion imaging. There was a mild to moderate stenosis in the cephalic vein near the subclavian vein confluence that was in the 50% range but did not appear particularly flow limiting so I did not treat this today. The remainder of the venous outflow was patent.   Based on the completion imaging, no further intervention is necessary.  The wire and balloon were removed from the sheath.  A 4-0 Monocryl purse-string suture was sewn around the  sheath.  The sheath was removed while tying down the suture.  A sterile bandage was applied to the puncture site.  COMPLICATIONS: None  CONDITION: Stable   [name] 02/04/2015 12:03 PM

## 2015-02-04 NOTE — ED Provider Notes (Addendum)
[facility] Department of Emergency Medicine   Code Blue CONSULT NOTE  Chief Complaint: Cardiac arrest/unresponsive   Level V Caveat: Unresponsive  History of present illness: I was contacted by the hospital for a CODE BLUE cardiac arrest in special precedures and presented to the patient's bedside.  Pt had just undergone a fistulagram and was in recovery.  She developed some respiratory distress, agonal breathing, and required support with BVM.  It was unclear to the nursing staff if she had a pulse and a code blue was called.  Upon my arrival, staff was using a doppler to attempt to detect pulse.  Pulse was palpable on my exam (left femoral).  Pt was unresponsive and had agonal breathing.  ROS: Unable to obtain, Level V caveat  Scheduled Meds: Continuous Infusions: PRN Meds:. Past Medical History  Diagnosis Date  . Stroke (HCC)   . MI (myocardial infarction) (HCC)   . Hearing impaired   . ESRD (end stage renal disease) (HCC)   . Hyperlipidemia   . Hypertension   . Diabetes mellitus without complication (HCC)   . Depression   . Coronary artery disease   . Dementia   . Secondary hyperparathyroidism (HCC)   . Dialysis patient [facility])     Monday, Wednesday and Friday.   . Chronic systolic CHF (congestive heart failure) (HCC)   . GERD (gastroesophageal reflux disease)   . Personal history of transient ischemic attack (TIA) and cerebral infarction without residual deficit   . Anemia    Past Surgical History  Procedure Laterality Date  . Cataract extraction, bilateral    . Wrist surgery    . Peripheral vascular catheterization Left 07/28/2014    Procedure: A/V Shuntogram/Fistulagram;  Surgeon: Renford Dills, MD;  Location: [facility] INVASIVE CV LAB;  Service: Cardiovascular;  Laterality: Left;  . Peripheral vascular catheterization Left 07/28/2014    Procedure: A/V Shunt Intervention;  Surgeon: Renford Dills, MD;  Location: [facility] INVASIVE CV LAB;  Service:  Cardiovascular;  Laterality: Left;   Social History   Social History  . Marital Status: Widowed    Spouse Name: N/A  . Number of Children: N/A  . Years of Education: N/A   Occupational History  . Not on file.   Social History Main Topics  . Smoking status: Never Smoker   . Smokeless tobacco: Not on file  . Alcohol Use: No  . Drug Use: No  . Sexual Activity: Not on file   Other Topics Concern  . Not on file   Social History Narrative   Allergies  Allergen Reactions  . Oxygen Swelling and Rash    Capnography line oxygen tubing monitor    Last set of Vital Signs (not current) Filed Vitals:   12/21/14 1100  BP: 183/93  Pulse: 80  Temp:   Resp: 17      Physical Exam  Gen: unresponsive with agonal breathing. Cardiovascular: regular heart rate at approx 65 Resp: apneic. Breath sounds equal bilaterally with bagging  Abd: nondistended  Neuro: withdraws to pain.  HEENT: No blood in posterior pharynx, gag reflex absent  Neck: overall appears normal on my exam.  Nurses report possible swelling.  Musculoskeletal: No deformity  Skin: warm  Procedures  INTUBATION Performed by: Darien Ramus Required items: required blood products, implants, devices, and special equipment available Patient identity confirmed: provided demographic data and hospital-assigned identification number Time out: Immediately prior to procedure a "time out" was called to verify the correct patient, procedure, equipment, support staff  and site/side marked as required. Indications: respiratory failure, altered mental status  Intubation method: direct laryngoscopy Preoxygenation: BVM Sedatives: etomidate 18 mg Paralytic:  Rocuronium 80 mg Tube Size: 7.5cuffed Post-procedure assessment: chest rise and ETCO2 monitor Breath sounds: equal and absent over the epigastrium Tube secured by Respiratory Therapy Patient tolerated the procedure well with no immediate complications.  CRITICAL  CARE Performed by: Darien RamusKAMINSKI,Graylyn Bunney W Total critical care time: 30 Critical care time was exclusive of separately billable procedures and treating other patients. Critical care was necessary to treat or prevent imminent or life-threatening deterioration. Critical care was time spent personally by me on the following activities: development of treatment plan with patient and/or surrogate as well as nursing, discussions with consultants, evaluation of patient's response to treatment, examination of patient, obtaining history from patient or surrogate, ordering and performing treatments and interventions, ordering and review of laboratory studies, ordering and review of radiographic studies, pulse oximetry and re-evaluation of patient's condition.  Medical Decision making   Positive pulse. Poor respiratory effort. Discussed wth Dr. Wyn Quakerew. Due to inability to protect airway, pt was intubated.   Assessment and Plan  Pt intubated as noted above. Call to Dr. Sherryll BurgerShah and then Dr. Scot DockVishani for admission. Discussed with house supervisor - to bed 9 ICU.   Darien Ramusavid W Demarquis Osley, MD 02/04/15 1305  Addendum: The above note was dictated on the day I provided services which was 02/04/2015. For reasons unclear to me, the computer labeled the date of service back to her prior admission to the hospital, 12/21/2014. I have tried to alter this, but I am unable, as the medical record system views the proper date (02/04/2015) as after her prior discharge and not as a note at the beginning of her current admission.  Darien Ramusavid W Paddy Walthall, MD 02/07/15 [phone]

## 2015-02-04 NOTE — Progress Notes (Signed)
   02/04/15 1400  Clinical Encounter Type  Visited With Family  Visit Type Code  Referral From Nurse  Consult/Referral To Chaplain  Spiritual Encounters  Spiritual Needs Prayer;Emotional  Responded to Code [facility] page.  Met with pt's daughter.  Provided pastoral presence, prayer and support.  Nursing staff said pt appeared to be responding.  Staff said they would be transferring pt to ccu.  Staff updated family member.  I updated unit chaplain to follow up with patient and family member at a later time.  [place] [phone]

## 2015-02-04 NOTE — OR Nursing (Signed)
#  20 gauge PIV saline lock placed right forearm by Asencion PartridgeKeith Harris ER staff. 80 MG IV rocuronium IV then Dr Carollee MassedKaminski placed 7.5 french ETT #19 at lips.

## 2015-02-04 NOTE — OR Nursing (Signed)
Code called, Dr Carollee MassedKaminski from ER arrived, respiratory at bedside bagging patient. Patient prepared for intubation. Pt intermittantly attempting to assist with respirations per Respiratory.

## 2015-02-04 NOTE — Progress Notes (Signed)
Patient with agonal breathing.  Code called.  Dr Carollee MassedKaminski, ED MD responded.  Pt intubated per MD without difficulty. Will transport for ICU 9.

## 2015-02-04 NOTE — H&P (Addendum)
[facility] [place] [facility]   PATIENT NAME: [name]    MR#:  [id number]  DATE OF BIRTH:  1946-01-16  DATE OF ADMISSION:  02/04/2015  PRIMARY CARE PHYSICIAN: Filbert Berthold, NP   REQUESTING/REFERRING PHYSICIAN: Dew  CHIEF COMPLAINT:  No chief complaint on file.   HISTORY OF PRESENT ILLNESS: [name]  is a 69 y.o. female with a known history of stroke, coronary artery disease, end-stage renal disease on hemodialysis for 3 years, hyperlipidemia, hypertension, diabetes, secondary, chronic systolic CHF- was scheduled for a fistulogram today the procedure took little longer than expected as per the ER physician who had seen the patient after that but patient tolerated the procedure very well after that when she was in the recovery room she had some swelling on her neck and face and altered mental status with some respiratory distress so CODE BLUE was activated, and ER physician reached over there he found patient had pulse and her blood pressure was also recordable, but due to swelling she was suspected to compromise her airway and so intubated the patient and started on ventilator. And called hospitalist team for admission.  Later on further discussion with her daughter was present there in the recovery room she told that patient had similar swelling in reaction, not up to this serious extent , in past more than a year ago in it also happened in the recovery room after the procedure and she notices that her lips and her neck area which was touching the oxygen tubing was getting swollen , so they took it off and patient recovered and she was sent home .   patient again had a fistulogram procedure in May 2016 but no reactions happened at that time as her daughter remembered to tell them to cover the tubing and they covered it with the cost. So he did not touch her face.  She says today she forgot to tell the nurses about that and after that but due to being on  patient started swelling up and this event happened. On further questioning any investigation I also found that patient is on lisinopril for her blood pressure control but daughter clearly remembers that lisinopril was started just a few months ago and patient's first time reaction which was more than a year ago at that time she was not on lisinopril.  PAST MEDICAL HISTORY:   Past Medical History  Diagnosis Date  . Stroke (HCC)   . MI (myocardial infarction) (HCC)   . Hearing impaired   . ESRD (end stage renal disease) (HCC)   . Hyperlipidemia   . Hypertension   . Diabetes mellitus without complication (HCC)   . Depression   . Coronary artery disease   . Dementia   . Secondary hyperparathyroidism (HCC)   . Dialysis patient [facility])     Monday, Wednesday and Friday.   . Chronic systolic CHF (congestive heart failure) (HCC)   . GERD (gastroesophageal reflux disease)   . Personal history of transient ischemic attack (TIA) and cerebral infarction without residual deficit   . Anemia     PAST SURGICAL HISTORY:  Past Surgical History  Procedure Laterality Date  . Cataract extraction, bilateral    . Wrist surgery    . Peripheral vascular catheterization Left 07/28/2014    Procedure: A/V Shuntogram/Fistulagram;  Surgeon: Renford Dills, MD;  Location: [facility] INVASIVE CV LAB;  Service: Cardiovascular;  Laterality: Left;  . Peripheral vascular catheterization Left 07/28/2014    Procedure:  A/V Shunt Intervention;  Surgeon: Renford DillsGregory G Schnier, MD;  Location: [facility] INVASIVE CV LAB;  Service: Cardiovascular;  Laterality: Left;    SOCIAL HISTORY:  Social History  Substance Use Topics  . Smoking status: Never Smoker   . Smokeless tobacco: Not on file  . Alcohol Use: No    FAMILY HISTORY:  Family History  Problem Relation Age of Onset  . CAD Father     DRUG ALLERGIES:  Allergies  Allergen Reactions  . Oxygen Swelling and Rash    Capnography line oxygen tubing monitor    REVIEW OF  SYSTEMS:   Patient is intubated and on ventilator support.  MEDICATIONS AT HOME:  Prior to Admission medications   Medication Sig Start Date End Date Taking? Authorizing Provider  aspirin EC 81 MG tablet Take 81 mg by mouth daily.   Yes Historical Provider, MD  atorvastatin (LIPITOR) 40 MG tablet Take 40 mg by mouth daily at 6 PM.   Yes Historical Provider, MD  calcium acetate (PHOSLO) 667 MG capsule Take 1,334 mg by mouth 3 (three) times daily with meals.    Yes Historical Provider, MD  clopidogrel (PLAVIX) 75 MG tablet Take 1 tablet (75 mg total) by mouth daily. 09/07/14  Yes Antonieta Ibaimothy J Gollan, MD  COD LIVER OIL PO Take 530 mg by mouth daily.   Yes Historical Provider, MD  docusate sodium (COLACE) 100 MG capsule Take 100 mg by mouth at bedtime.    Yes Historical Provider, MD  hydrALAZINE (APRESOLINE) 25 MG tablet Take 1 tablet (25 mg total) by mouth 3 (three) times daily. Patient taking differently: Take 25 mg by mouth 2 (two) times daily.  07/17/14  Yes Antonieta Ibaimothy J Gollan, MD  isosorbide mononitrate (IMDUR) 30 MG 24 hr tablet Take 1 tablet (30 mg total) by mouth daily. 08/11/14  Yes Antonieta Ibaimothy J Gollan, MD  lactobacillus acidophilus (BACID) TABS tablet Take 1 tablet by mouth at bedtime.   Yes Historical Provider, MD  lisinopril (PRINIVIL,ZESTRIL) 5 MG tablet Take 5 mg by mouth daily.   Yes Historical Provider, MD  metoprolol tartrate (LOPRESSOR) 25 MG tablet Take 25 mg by mouth 2 (two) times daily.   Yes Historical Provider, MD  Omega-3 Fatty Acids (FISH OIL) 500 MG CAPS Take 500 mg by mouth 2 (two) times daily.   Yes Historical Provider, MD  ranitidine (ZANTAC) 150 MG capsule Take 1 capsule (150 mg total) by mouth every evening. Patient taking differently: Take 150 mg by mouth daily.  10/02/14  Yes Amy Rusty AusLauren Krebs, NP  Vitamin D, Ergocalciferol, (DRISDOL) 50000 UNITS CAPS capsule Take 50,000 Units by mouth every 30 (thirty) days. Pt takes on the 7th of every month.   Yes Historical Provider, MD   atorvastatin (LIPITOR) 80 MG tablet Take 1 tablet (80 mg total) by mouth daily. 08/11/14   Antonieta Ibaimothy J Gollan, MD  insulin aspart (NOVOLOG) 100 UNIT/ML injection Inject 3-13 Units into the skin 3 (three) times daily with meals. Sliding scale    Historical Provider, MD  lisinopril (PRINIVIL,ZESTRIL) 20 MG tablet Take 1 tablet (20 mg total) by mouth daily. 07/16/14   Antonieta Ibaimothy J Gollan, MD  LORazepam (ATIVAN) 2 MG/ML concentrated solution Take 2 mg by mouth as needed for anxiety.    Historical Provider, MD  Nutritional Supplements (FEEDING SUPPLEMENT, NEPRO CARB STEADY,) LIQD Take 237 mLs by mouth 2 (two) times daily between meals. 12/05/14   Gale Journeyatherine P Walsh, MD  promethazine (PHENERGAN) 25 MG tablet Take 25 mg by mouth every 6 (  six) hours as needed for nausea or vomiting.    Historical Provider, MD  vitamin C (ASCORBIC ACID) 500 MG tablet Take 500 mg by mouth 2 (two) times daily.     Historical Provider, MD      PHYSICAL EXAMINATION:   VITAL SIGNS: Blood pressure 155/83, pulse 70, temperature 97.6 F (36.4 C), temperature source Axillary, resp. rate 0, height  (1.676 m), weight 58.06 kg (128 lb), SpO2 100 %.  GENERAL:  69 y.o.-year-old patient lying in the bed with no acute distress. On ventilatory support EYES: Pupils equal, round, reactive to light . No scleral icterus.  HEENT: Head atraumatic, normocephalic. Seen her immediately after the reaction- in recovery room, she has a little bit swelling at that time on her chin and her upper lip, later around 6:30 PM in ICU examined her again and she has her eyelids her lips and her tongue was swollen and protruding from mouth. NECK:  Supple, no jugular venous distention. No thyroid enlargement, no tenderness.  LUNGS: Normal breath sounds bilaterally, no wheezing, rales,rhonchi or crepitation. No use of accessory muscles of respiration.  CARDIOVASCULAR: S1, S2 normal. No murmurs, rubs, or gallops.  ABDOMEN: Soft, nontender, nondistended. Bowel  sounds present. No organomegaly or mass.  EXTREMITIES: No pedal edema, cyanosis, or clubbing. Left arm AV fistula present NEUROLOGIC: Sedated on ventilatory support.  PSYCHIATRIC: The patient is sedated.  SKIN: No obvious rash, lesion, or ulcer.   LABORATORY PANEL:   CBC  Recent Labs Lab 02/04/15 1731  WBC 4.1  HGB 11.8*  HCT 36.0  PLT 119*  MCV 87.8  MCH 28.9  MCHC 32.9  RDW 21.6*   ------------------------------------------------------------------------------------------------------------------  Chemistries   Recent Labs Lab 02/04/15 1731  CREATININE 7.97*   ------------------------------------------------------------------------------------------------------------------ estimated creatinine clearance is 6.2 mL/min (by C-G formula based on Cr of 7.97). ------------------------------------------------------------------------------------------------------------------ No results for input(s): TSH, T4TOTAL, T3FREE, THYROIDAB in the last 72 hours.  Invalid input(s): FREET3   Coagulation profile No results for input(s): INR, PROTIME in the last 168 hours. ------------------------------------------------------------------------------------------------------------------- No results for input(s): DDIMER in the last 72 hours. -------------------------------------------------------------------------------------------------------------------  Cardiac Enzymes No results for input(s): CKMB, TROPONINI, MYOGLOBIN in the last 168 hours.  Invalid input(s): CK ------------------------------------------------------------------------------------------------------------------ Invalid input(s): POCBNP  ---------------------------------------------------------------------------------------------------------------  Urinalysis    Component Value Date/Time   COLORURINE YELLOW* 12/02/2014 1721   COLORURINE Yellow 04/22/2014 0702   APPEARANCEUR CLEAR* 12/02/2014 1721   APPEARANCEUR  Hazy 04/22/2014 0702   LABSPEC 1.016 12/02/2014 1721   LABSPEC 1.022 04/22/2014 0702   PHURINE 7.0 12/02/2014 1721   PHURINE 6.0 04/22/2014 0702   GLUCOSEU 50* 12/02/2014 1721   GLUCOSEU 150 mg/dL 84/69/6295 2841   HGBUR NEGATIVE 12/02/2014 1721   HGBUR Negative 04/22/2014 0702   BILIRUBINUR NEGATIVE 12/02/2014 1721   BILIRUBINUR Negative 04/22/2014 0702   KETONESUR NEGATIVE 12/02/2014 1721   KETONESUR Negative 04/22/2014 0702   PROTEINUR >500* 12/02/2014 1721   PROTEINUR >=500 04/22/2014 0702   NITRITE NEGATIVE 12/02/2014 1721   NITRITE Negative 04/22/2014 0702   LEUKOCYTESUR NEGATIVE 12/02/2014 1721   LEUKOCYTESUR 1+ 04/22/2014 0702     RADIOLOGY: X-ray Chest Pa Or Ap  02/04/2015  CLINICAL DATA:  Postop cardiac catheterization, the patient coded while in special procedures EXAM: CHEST 1 VIEW COMPARISON:  Portable chest x-ray of 12/02/2014 FINDINGS: The tip of the endotracheal tube is approximately 2.1 cm above the carina. The lungs appear well aerated. No infiltrate or effusion is seen. Cardiomegaly is stable. IMPRESSION: 1. Endotracheal tube tip 2.1 cm above  the carina. 2. Improved aeration of the lungs. Electronically Signed   By: Dwyane Dee M.D.   On: 02/04/2015 13:26   Dg Abd 1 View  02/04/2015  CLINICAL DATA:  NG tube placement. EXAM: ABDOMEN - 1 VIEW COMPARISON:  12/21/2014 FINDINGS: The NG tube tip is in the fundal/ body region of the stomach. The bowel gas pattern is unremarkable. External pacer paddles are noted over the chest. IMPRESSION: NG tube tip is in the fundal/ body region of the stomach. Electronically Signed   By: Rudie Meyer M.D.   On: 02/04/2015 14:33    EKG: Orders placed or performed during the hospital encounter of 12/21/14  . EKG 12-Lead  . EKG 12-Lead  . EKG    IMPRESSION AND PLAN: * Acute respiratory failure with altered mental status and angioedema  After having a long discussion with patient's daughter it seems like she is allergic to the  tubing used for oxygen delivery,   Steroid and Benadryl to help with angioedema.  Ventilatory support meanwhile.  Pulmonary team to help manage ventilator and plan for extubation when possible.  * End-stage renal disease on hemodialysis  As per vascular doctor her fistula is working fine now we will call nephrology consult to help with dialysis.  * Hypertension  Continue home medications except lisinopril  Though it is very clear now that her reaction is not due to lisinopril as daughter told she had one reaction in the past more than a year ago when she was not taking lisinopril, but it is very well known to cause angioedema so I would like to avoid that- but clearly not putting it in her allergy list and I informed her daughter about that.  * Hypothermia  Possibly due to angioedema and was a dilation,   She is on warming he has a blanket.  * Coronary artery disease  Continue aspirin and Plavix   All the records are reviewed and case discussed with ED provider. Management plans discussed with the patient, family and they are in agreement.  CODE STATUS:    Code Status Orders        Start     Ordered   02/04/15 1710  Full code   Continuous     02/04/15 1709       TOTAL TIME TAKING CARE OF THIS PATIENT: 50 minutes.    Altamese Dilling M.D on 02/04/2015   Between 7am to 6pm - Pager - [phone]  After 6pm go to [email or web address] - password [email or web address] [facility]  [place] [facility]  Office  [phone]  CC: Primary care physician; Amy L Krebs, NP   Note: This dictation was prepared with Dragon dictation along with smaller phrase technology. Any transcriptional errors that result from this process are unintentional.

## 2015-02-04 NOTE — H&P (Signed)
[place] [facility] Admission History & Physical  MRN : [id number]  [name] is a 69 y.o. (12/23/1945) female who presents with chief complaint of No chief complaint on file. [name].  History of Present Illness: Patient with a poorly functioning left arm AV graft referred from the dialysis access center. Up until Monday, this was running although the flows were low and there was prolonged bleeding. Yesterday, they were unable to access the graft and felt that it was clotted. She is sent to try to have this salvage today. She has no other complaints today.  Current Facility-Administered Medications  Medication Dose Route Frequency Provider Last Rate Last Dose  . 0.9 %  sodium chloride infusion   Intravenous Continuous Kimberly A Stegmayer, PA-C 10 mL/hr at 02/04/15 1018    . insulin aspart (novoLOG) injection 0-24 Units  0-24 Units Subcutaneous 6 times per day Tonette LedererKimberly A Stegmayer, PA-C        Past Medical History  Diagnosis Date  . Stroke (HCC)   . MI (myocardial infarction) (HCC)   . Hearing impaired   . ESRD (end stage renal disease) (HCC)   . Hyperlipidemia   . Hypertension   . Diabetes mellitus without complication (HCC)   . Depression   . Coronary artery disease   . Dementia   . Secondary hyperparathyroidism (HCC)   . Dialysis patient [facility](HCC)     Monday, Wednesday and Friday.   . Chronic systolic CHF (congestive heart failure) (HCC)   . GERD (gastroesophageal reflux disease)   . Personal history of transient ischemic attack (TIA) and cerebral infarction without residual deficit   . Anemia     Past Surgical History  Procedure Laterality Date  . Cataract extraction, bilateral    . Wrist surgery    . Peripheral vascular catheterization Left 07/28/2014    Procedure: A/V Shuntogram/Fistulagram;  Surgeon: Renford DillsGregory G Schnier, MD;  Location: [facility] INVASIVE CV LAB;  Service: Cardiovascular;  Laterality: Left;  . Peripheral vascular catheterization Left 07/28/2014     Procedure: A/V Shunt Intervention;  Surgeon: Renford DillsGregory G Schnier, MD;  Location: [facility] INVASIVE CV LAB;  Service: Cardiovascular;  Laterality: Left;    Social History Social History  Substance Use Topics  . Smoking status: Never Smoker   . Smokeless tobacco: None  . Alcohol Use: No  No IVDU  Family History No known history of clotting disorders, bleeding disorders, autoimmune diseases, or aneurysms  No Known Allergies   REVIEW OF SYSTEMS (Negative unless checked)  Constitutional: [] Weight loss  [] Fever  [] Chills Cardiac: [] Chest pain   [] Chest pressure   [] Palpitations   [] Shortness of breath when laying flat   [] Shortness of breath at rest   [] Shortness of breath with exertion. Vascular:  [] Pain in legs with walking   [] Pain in legs at rest   [] Pain in legs when laying flat   [] Claudication   [] Pain in feet when walking  [] Pain in feet at rest  [] Pain in feet when laying flat   [] History of DVT   [] Phlebitis   [x] Swelling in legs   [] Varicose veins   [] Non-healing ulcers Pulmonary:   [] Uses home oxygen   [] Productive cough   [] Hemoptysis   [] Wheeze  [] COPD   [] Asthma Neurologic:  [] Dizziness  [] Blackouts   [x] Seizures   [] History of stroke   [] History of TIA  [] Aphasia   [] Temporary blindness   [] Dysphagia   [] Weakness or numbness in arms   [] Weakness or numbness in legs Musculoskeletal:  [] Arthritis   []   Joint swelling   Joint pain   Low back pain Hematologic:  Easy bruising  Easy bleeding   Hypercoagulable state   Anemic  Hepatitis Gastrointestinal:  Blood in stool   Vomiting blood  Gastroesophageal reflux/heartburn   Difficulty swallowing. Genitourinary:  Chronic kidney disease   Difficult urination  Frequent urination  Burning with urination   Blood in urine Skin:  Rashes   Ulcers   Wounds Psychological:  History of anxiety    History of major depression.  Physical Examination  Filed Vitals:   02/04/15 0934  BP: 160/91  Pulse: 59   Temp: 97.8 F (36.6 C)  TempSrc: Oral  Resp: 16  Height:  (1.6 m)  Weight: 58.06 kg (128 lb)  SpO2: 92%   Body mass index is 22.68 kg/(m^2). Gen: WD/WN, NAD Head: [place]/AT, No temporalis wasting. Prominent temp pulse not noted. Ear/Nose/Throat: Hearing grossly intact, nares w/o erythema or drainage, oropharynx w/o Erythema/Exudate,  Eyes: PERRLA, EOMI.  Neck: Supple, no nuchal rigidity.  No JVD.  Pulmonary:  Good air movement,  no use of accessory muscles.  Cardiac: RRR, murmur present. Vascular: no thrill in AVG left arm Vessel Right Left  Radial Palpable Palpable                                   Gastrointestinal: soft, non-tender/non-distended. No guarding/reflex.  Musculoskeletal: M/S 5/5 throughout.  Extremities without ischemic changes.  No deformity or atrophy.  Neurologic: CN 2-12 intact. Pain and light touch intact in extremities.  Symmetrical.  Speech is fluent. Motor exam as listed above. Psychiatric: Judgment intact, Mood & affect appropriate for pt's clinical situation. Dermatologic: No rashes or ulcers noted.  No cellulitis or open wounds. Lymph : No Cervical, Axillary, or Inguinal lymphadenopathy.     CBC Lab Results  Component Value Date   WBC 3.6 12/03/2014   HGB 10.1* 12/03/2014   HCT 30.9* 12/03/2014   MCV 85.9 12/03/2014   PLT 216 12/03/2014    BMET    Component Value Date/Time   NA 135 12/03/2014 0619   NA 133* 04/24/2014 1037   K 4.0 12/03/2014 0619   K 4.5 04/24/2014 1037   CL 90* 12/03/2014 0619   CL 93* 04/24/2014 1037   CO2 30 12/03/2014 0619   CO2 30 04/24/2014 1037   GLUCOSE 156* 12/03/2014 0619   GLUCOSE 229* 04/24/2014 1037   BUN 70* 12/03/2014 0619   BUN 48* 04/24/2014 1037   CREATININE 8.62* 12/03/2014 0619   CREATININE 6.12* 04/24/2014 1037   CALCIUM 8.9 12/03/2014 0619   CALCIUM 8.1* 04/24/2014 1037   GFRNONAA 4* 12/03/2014 0619   GFRNONAA 7* 04/24/2014 1037   GFRNONAA 15* 07/16/2013 1750   GFRAA 5* 12/03/2014  0619   GFRAA 9* 04/24/2014 1037   GFRAA 18* 07/16/2013 1750   CrCl cannot be calculated (Patient has no serum creatinine result on file.).  COAG Lab Results  Component Value Date   INR 1.13 12/02/2014   INR 0.9 04/21/2014    Radiology No results found.    Assessment/Plan 1. End-stage renal disease. Not able to get dialysis yesterday due to a nonfunctional graft. Needs access. 2. Dysfunction of dialysis access. Access is reported as thrombosed and does not have a thrill. Will attempt salvage today. 3. Diabetes. Continue outpatient medicines. Stable. 4. Hypertension. Stable. Continue outpatient medicines   DEW,JASON, MD  02/04/2015 10:33 AM

## 2015-02-04 NOTE — ED Provider Notes (Addendum)
13:07  X-ray reviewed. Tube ok, but a little low. Was supposed to be at 19, now at 3721.  Re-instructed respiratory to move to 19cm.    Darien Ramusavid W Ran Tullis, MD 02/04/15 1307  Addendum: The above note was dictated on the day I provided services which was 02/04/2015. For reasons unclear to me, the computer labeled the date of service back to her prior admission to the hospital, 12/21/2014. I have tried to alter this, but I am unable, as the medical record system views the proper date (02/04/2015) as after her prior discharge and not as a note at the beginning of her current admission.   Darien Ramusavid W Domonique Cothran, MD 02/07/15 [id number]

## 2015-02-04 NOTE — OR Nursing (Signed)
Pt deveolped swelling redness and rash where capnography tubing touched body. Dr Wyn Quakerew aware, medicated for allergy, benedryl and solumedrol given.

## 2015-02-05 ENCOUNTER — Inpatient Hospital Stay: Payer: Medicare Other

## 2015-02-05 ENCOUNTER — Encounter: Payer: Self-pay | Admitting: Vascular Surgery

## 2015-02-05 DIAGNOSIS — T783XXA Angioneurotic edema, initial encounter: Secondary | ICD-10-CM

## 2015-02-05 DIAGNOSIS — J9600 Acute respiratory failure, unspecified whether with hypoxia or hypercapnia: Secondary | ICD-10-CM

## 2015-02-05 LAB — GLUCOSE, CAPILLARY
GLUCOSE-CAPILLARY: 149 mg/dL — AB (ref 65–99)
GLUCOSE-CAPILLARY: 135 mg/dL — AB (ref 65–99)
GLUCOSE-CAPILLARY: 104 mg/dL — AB (ref 65–99)
Glucose-Capillary: 156 mg/dL — ABNORMAL HIGH (ref 65–99)
Glucose-Capillary: 131 mg/dL — ABNORMAL HIGH (ref 65–99)

## 2015-02-05 LAB — CBC
HEMATOCRIT: 39.1 % (ref 35.0–47.0)
Hemoglobin: 12.5 g/dL (ref 12.0–16.0)
MCH: 27.9 pg (ref 26.0–34.0)
MCHC: 31.9 g/dL — AB (ref 32.0–36.0)
MCV: 87.7 fL (ref 80.0–100.0)
Platelets: 135 10*3/uL — ABNORMAL LOW (ref 150–440)
RBC: 4.46 MIL/uL (ref 3.80–5.20)
RDW: 21.6 % — AB (ref 11.5–14.5)
WBC: 2.2 10*3/uL — ABNORMAL LOW (ref 3.6–11.0)

## 2015-02-05 LAB — BASIC METABOLIC PANEL
Anion gap: 16 — ABNORMAL HIGH (ref 5–15)
BUN: 65 mg/dL — AB (ref 6–20)
CALCIUM: 8.7 mg/dL — AB (ref 8.9–10.3)
CO2: 24 mmol/L (ref 22–32)
Chloride: 94 mmol/L — ABNORMAL LOW (ref 101–111)
Creatinine, Ser: 8.61 mg/dL — ABNORMAL HIGH (ref 0.44–1.00)
GFR calc Af Amer: 5 mL/min — ABNORMAL LOW (ref >60)
GFR, EST NON AFRICAN AMERICAN: 4 mL/min — AB (ref >60)
GLUCOSE: 152 mg/dL — AB (ref 65–99)
Potassium: 4.2 mmol/L (ref 3.5–5.1)
Sodium: 134 mmol/L — ABNORMAL LOW (ref 135–145)

## 2015-02-05 MED ORDER — CHLORHEXIDINE GLUCONATE 0.12% ORAL RINSE (MEDLINE KIT)
15.0000 mL | Freq: Two times a day (BID) | OROMUCOSAL | Status: DC
  Administered 2015-02-05 – 2015-02-16 (×20): 15 mL via OROMUCOSAL
  Filled 2015-02-05 (×27): qty 15

## 2015-02-05 MED ORDER — HYDRALAZINE HCL 25 MG PO TABS
25.0000 mg | ORAL_TABLET | Freq: Two times a day (BID) | ORAL | Status: DC
  Administered 2015-02-05: 25 mg via ORAL
  Filled 2015-02-05: qty 1

## 2015-02-05 MED ORDER — VITAL HIGH PROTEIN PO LIQD
1000.0000 mL | ORAL | Status: DC
  Administered 2015-02-05: 1000 mL
  Administered 2015-02-06: 07:00:00
  Administered 2015-02-06 – 2015-02-08 (×3): 1000 mL

## 2015-02-05 MED ORDER — FREE WATER
100.0000 mL | Freq: Three times a day (TID) | Status: DC
  Administered 2015-02-05 – 2015-02-17 (×35): 100 mL

## 2015-02-05 MED ORDER — ISOSORBIDE MONONITRATE ER 30 MG PO TB24
30.0000 mg | ORAL_TABLET | Freq: Every day | ORAL | Status: DC
  Administered 2015-02-06 – 2015-02-08 (×2): 30 mg via ORAL
  Filled 2015-02-05 (×3): qty 1

## 2015-02-05 MED ORDER — LEVETIRACETAM 500 MG/5ML IV SOLN
500.0000 mg | Freq: Two times a day (BID) | INTRAVENOUS | Status: DC
  Administered 2015-02-05 – 2015-02-07 (×4): 500 mg via INTRAVENOUS
  Filled 2015-02-05 (×5): qty 5

## 2015-02-05 MED ORDER — CETYLPYRIDINIUM CHLORIDE 0.05 % MT LIQD
7.0000 mL | Freq: Two times a day (BID) | OROMUCOSAL | Status: DC
Start: 2015-02-05 — End: 2015-02-06
  Administered 2015-02-05 (×2): 7 mL via OROMUCOSAL

## 2015-02-05 MED ORDER — ANTISEPTIC ORAL RINSE SOLUTION (CORINZ)
7.0000 mL | Freq: Four times a day (QID) | OROMUCOSAL | Status: DC
  Administered 2015-02-05 – 2015-02-17 (×44): 7 mL via OROMUCOSAL
  Filled 2015-02-05 (×53): qty 7

## 2015-02-05 MED ORDER — CHLORHEXIDINE GLUCONATE 0.12 % MT SOLN
15.0000 mL | Freq: Two times a day (BID) | OROMUCOSAL | Status: DC
  Administered 2015-02-05 (×2): 15 mL via OROMUCOSAL
  Filled 2015-02-05: qty 15

## 2015-02-05 MED ORDER — METOPROLOL TARTRATE 25 MG PO TABS
12.5000 mg | ORAL_TABLET | Freq: Two times a day (BID) | ORAL | Status: DC
  Administered 2015-02-05 – 2015-02-08 (×4): 12.5 mg via ORAL
  Filled 2015-02-05 (×6): qty 1

## 2015-02-05 NOTE — Care Management (Signed)
Patient coded after having vascular procedure on thrombosed AV graft.   She suffered acute respiratory failure ?due to angioedema.  She has esrd on and hemodialysis. Notified dialysis coordinator of admission

## 2015-02-05 NOTE — Progress Notes (Signed)
Initial Nutrition Assessment     INTERVENTION:   EN: recommend starting Adult Tube Feeding Protocol with Vital High Protein at rate of 20 ml/hr with goal rate of 40 ml/hr providing 960 kcals, 85 g of protein and 806 mL of free water. Additional kcals from diprivan. Continue to assess  NUTRITION DIAGNOSIS:   Inadequate oral intake related to acute illness as evidenced by NPO status.  GOAL:   Provide needs based on ASPEN/SCCM guidelines  MONITOR:    (Energy Intake, Anthropometrics, Electrolyte/Renal Profile, Glucose Profile, Pulmonary)  REASON FOR ASSESSMENT:   Ventilator, Consult Enteral/tube feeding initiation and management  ASSESSMENT:    Pt admitted with thrombosed AV graft s/p vascular intervention, pt developed acute respiratory failure due to acute angioedema requiring intubation; pt with ESRD on HD  Past Medical History  Diagnosis Date  . Stroke (HCC)   . MI (myocardial infarction) (HCC)   . Hearing impaired   . ESRD (end stage renal disease) (HCC)   . Hyperlipidemia   . Hypertension   . Diabetes mellitus without complication (HCC)   . Depression   . Coronary artery disease   . Dementia   . Secondary hyperparathyroidism (HCC)   . Dialysis patient [facility](HCC)     Monday, Wednesday and Friday.   . Chronic systolic CHF (congestive heart failure) (HCC)   . GERD (gastroesophageal reflux disease)   . Personal history of transient ischemic attack (TIA) and cerebral infarction without residual deficit   . Anemia    Diet Order:   NPO  Digestive System: NG in place, LIS with minimal drainage, abdomen soft  Skin:  Reviewed, no issues  Electrolyte and Renal Profile:  Recent Labs Lab 02/04/15 1731 02/05/15 0611  BUN  --  65*  CREATININE 7.97* 8.61*  NA  --  134*  K  --  4.2   Glucose Profile:   Recent Labs  02/05/15 0342 02/05/15 0716 02/05/15 1123  GLUCAP 156* 149* 131*   Meds: decadron, benadryl, ss novolog, diprivan (277 kcals in 24 hours at current  rate)  Nutrition Focused Physical Exam:  Unable to complete Nutrition-Focused physical exam at this time.    Height:   Ht Readings from Last 1 Encounters:  02/04/15 5\' 6"  (1.676 m)    Weight:   Wt Readings from Last 1 Encounters:  02/04/15 128 lb (58.06 kg)    Filed Weights   02/04/15 0934 02/04/15 1310  Weight: 128 lb (58.06 kg) 128 lb (58.06 kg)    BMI:  Body mass index is 20.67 kg/(m^2).  Estimated Nutritional Needs:   Kcal:  1211 kcals (BEE 1128, Ve: 6.3, Tmax: 36.8) using wt of 58.1 kg  Protein:  70-116 g (1.2-2.0 g/kg)   Fluid:  1000 mL plus UOP  HIGH Care Level  Romelle Starcherate Domanique Huesman MS, RD, LDN [phone] Pager

## 2015-02-05 NOTE — Consult Note (Signed)
[facility] Pulmonary Medicine Consultation      Name: [name] MRN: [id number] DOB: [birth year]    ADMISSION DATE:  02/04/2015  CHIEF COMPLAINT:    acute resp failure   HISTORY OF PRESENT ILLNESS  Remains intubated,sedated, still with angioedema Angioedema stoi  SIGNIFICANT EVENTS  11/16 intubated    Review of Systems  Unable to perform ROS: critical illness      VITAL SIGNS    Temp:  [95.5 F (35.3 C)-98.3 F (36.8 C)] 97.9 F (36.6 C) (11/18 0430) Pulse Rate:  [52-88] 63 (11/18 1046) Resp:  [0-27] 13 (11/18 1000) BP: (111-196)/(48-129) 152/48 mmHg (11/18 1046) SpO2:  [89 %-100 %] 100 % (11/18 1000) FiO2 (%):  [30 %-100 %] 30 % (11/18 0318) Weight:  [128 lb (58.06 kg)] 128 lb (58.06 kg) (11/17 1310) HEMODYNAMICS:   VENTILATOR SETTINGS: Vent Mode:  [-] PRVC FiO2 (%):  [30 %-100 %] 30 % Set Rate:  [14 bmp-15 bmp] 14 bmp Vt Set:  [450 mL-500 mL] 450 mL PEEP:  [5 cmH20] 5 cmH20 INTAKE / OUTPUT:  Intake/Output Summary (Last 24 hours) at 02/05/15 1108 Last data filed at 02/05/15 1000  Gross per 24 hour  Intake 596.79 ml  Output    249 ml  Net 347.79 ml       PHYSICAL EXAM   Physical Exam  Constitutional: No distress.  HENT:  Head: Normocephalic and atraumatic.  Swollen lips and tongue  Eyes: Pupils are equal, round, and reactive to light.  Neck: Normal range of motion. Neck supple.  Cardiovascular: Normal rate and regular rhythm.   No murmur heard. Pulmonary/Chest: No respiratory distress. She has no wheezes. She has no rales.  resp distress  Abdominal: Soft. Bowel sounds are normal. She exhibits no distension. There is no tenderness.  Musculoskeletal: She exhibits no edema.  Neurological:  gcs<8T  Skin: Skin is warm. She is not diaphoretic.       LABS   LABS:  CBC  Recent Labs Lab 02/04/15 1731 02/05/15 0611  WBC 4.1 2.2*  HGB 11.8* 12.5  HCT 36.0 39.1  PLT 119* 135*   Coag's No results for input(s): APTT, INR in  the last 168 hours. BMET  Recent Labs Lab 02/04/15 1731 02/05/15 0611  NA  --  134*  K  --  4.2  CL  --  94*  CO2  --  24  BUN  --  65*  CREATININE 7.97* 8.61*  GLUCOSE  --  152*   Electrolytes  Recent Labs Lab 02/05/15 0611  CALCIUM 8.7*   Sepsis Markers No results for input(s): LATICACIDVEN, PROCALCITON, O2SATVEN in the last 168 hours. ABG  Recent Labs Lab 02/04/15 1705  PHART 7.45  PCO2ART 36  PO2ART 132*   Liver Enzymes No results for input(s): AST, ALT, ALKPHOS, BILITOT, ALBUMIN in the last 168 hours. Cardiac Enzymes No results for input(s): TROPONINI, PROBNP in the last 168 hours. Glucose  Recent Labs Lab 02/04/15 1347 02/04/15 2330 02/05/15 0342 02/05/15 0716  GLUCAP 162* 189* 156* 149*     Recent Results (from the past 240 hour(s))  MRSA PCR Screening     Status: None   Collection Time: 02/04/15  2:05 PM  Result Value Ref Range Status   MRSA by PCR NEGATIVE NEGATIVE Final    Comment:        The GeneXpert MRSA Assay (FDA approved for NASAL specimens only), is one component of a comprehensive MRSA colonization surveillance program. It is not intended to diagnose  MRSA infection nor to guide or monitor treatment for MRSA infections.      Current facility-administered medications:  .  0.9 %  sodium chloride infusion, 500 mL, Intravenous, Once PRN, Annice Needy, MD .  acetaminophen (TYLENOL) tablet 325-650 mg, 325-650 mg, Oral, Q4H PRN **OR** acetaminophen (TYLENOL) suppository 325-650 mg, 325-650 mg, Rectal, Q4H PRN, Annice Needy, MD .  alum & mag hydroxide-simeth (MAALOX/MYLANTA) 200-200-20 MG/5ML suspension 15-30 mL, 15-30 mL, Oral, Q2H PRN, Annice Needy, MD .  antiseptic oral rinse (CPC / CETYLPYRIDINIUM CHLORIDE 0.05%) solution 7 mL, 7 mL, Mouth Rinse, q12n4p, Erin Fulling, MD .  antiseptic oral rinse solution (CORINZ), 7 mL, Mouth Rinse, QID, Altamese Dilling, MD, 7 mL at 02/05/15 0350 .  aspirin chewable tablet 81 mg, 81 mg, Per  Tube, Daily, Altamese Dilling, MD, 81 mg at 02/05/15 1046 .  atorvastatin (LIPITOR) tablet 40 mg, 40 mg, Oral, q1800, Altamese Dilling, MD .  chlorhexidine (PERIDEX) 0.12 % solution 15 mL, 15 mL, Mouth Rinse, BID, Erin Fulling, MD, 15 mL at 02/05/15 1000 .  chlorhexidine gluconate (PERIDEX) 0.12 % solution 15 mL, 15 mL, Mouth Rinse, BID, Altamese Dilling, MD, 15 mL at 02/05/15 0800 .  clopidogrel (PLAVIX) tablet 75 mg, 75 mg, Oral, Daily, Altamese Dilling, MD, 75 mg at 02/05/15 1046 .  dexamethasone (DECADRON) injection 10 mg, 10 mg, Intravenous, 4 times per day, Erin Fulling, MD, 10 mg at 02/05/15 0553 .  diphenhydrAMINE (BENADRYL) injection 25 mg, 25 mg, Intravenous, Q8H, Erin Fulling, MD, 25 mg at 02/05/15 0554 .  guaiFENesin-dextromethorphan (ROBITUSSIN DM) 100-10 MG/5ML syrup 15 mL, 15 mL, Oral, Q4H PRN, Annice Needy, MD .  heparin injection 5,000 Units, 5,000 Units, Subcutaneous, 3 times per day, Altamese Dilling, MD, 5,000 Units at 02/05/15 0557 .  hydrALAZINE (APRESOLINE) injection 20 mg, 20 mg, Intravenous, Q4H PRN, Erin Fulling, MD, 20 mg at 02/04/15 1623 .  hydrALAZINE (APRESOLINE) injection 5 mg, 5 mg, Intravenous, Q20 Min PRN, Annice Needy, MD .  insulin aspart (novoLOG) injection 0-15 Units, 0-15 Units, Subcutaneous, 6 times per day, Wyatt Haste, MD, 2 Units at 02/05/15 [phone] .  labetalol (NORMODYNE,TRANDATE) injection 10 mg, 10 mg, Intravenous, Q10 min PRN, Annice Needy, MD .  metoprolol (LOPRESSOR) injection 2-5 mg, 2-5 mg, Intravenous, Q2H PRN, Annice Needy, MD .  metoprolol tartrate (LOPRESSOR) tablet 25 mg, 25 mg, Oral, BID, Altamese Dilling, MD, 25 mg at 02/05/15 1046 .  morphine 4 MG/ML injection 2-4 mg, 2-4 mg, Intravenous, Q1H PRN, Annice Needy, MD .  ondansetron (ZOFRAN) injection 4 mg, 4 mg, Intravenous, Q6H PRN, Annice Needy, MD .  oxyCODONE-acetaminophen (PERCOCET/ROXICET) 5-325 MG per tablet 1-2 tablet, 1-2 tablet, Oral, Q4H PRN, Annice Needy, MD .   pantoprazole (PROTONIX) EC tablet 40 mg, 40 mg, Oral, Q1200, Erin Fulling, MD .  phenol (CHLORASEPTIC) mouth spray 1 spray, 1 spray, Mouth/Throat, PRN, Annice Needy, MD .  promethazine (PHENERGAN) tablet 25 mg, 25 mg, Oral, Q6H PRN, Altamese Dilling, MD .  propofol (DIPRIVAN) 1000 MG/100ML infusion, 5-80 mcg/kg/min, Intravenous, Titrated, Erin Fulling, MD, Last Rate: 10.5 mL/hr at 02/05/15 0852, 30 mcg/kg/min at 02/05/15 0852 .  vitamin C (ASCORBIC ACID) tablet 500 mg, 500 mg, Oral, BID, Altamese Dilling, MD, 500 mg at 02/05/15 1046  IMAGING    X-ray Chest Pa Or Ap  02/04/2015  CLINICAL DATA:  Postop cardiac catheterization, the patient coded while in special procedures EXAM: CHEST 1 VIEW COMPARISON:  Portable chest  x-ray of 12/02/2014 FINDINGS: The tip of the endotracheal tube is approximately 2.1 cm above the carina. The lungs appear well aerated. No infiltrate or effusion is seen. Cardiomegaly is stable. IMPRESSION: 1. Endotracheal tube tip 2.1 cm above the carina. 2. Improved aeration of the lungs. Electronically Signed   By: Dwyane DeePaul  Barry M.D.   On: 02/04/2015 13:26   Dg Abd 1 View  02/04/2015  CLINICAL DATA:  NG tube placement. EXAM: ABDOMEN - 1 VIEW COMPARISON:  12/21/2014 FINDINGS: The NG tube tip is in the fundal/ body region of the stomach. The bowel gas pattern is unremarkable. External pacer paddles are noted over the chest. IMPRESSION: NG tube tip is in the fundal/ body region of the stomach. Electronically Signed   By: Rudie MeyerP.  Gallerani M.D.   On: 02/04/2015 14:33    MAJOR EVENTS/TEST RESULTS: Thrombosed left arm AV graft s/p balloon angioplast  I  MICRO DATA: MRSA PCR  Urine  Blood Resp   ANTIMICROBIALS:     ASSESSMENT/PLAN   69 yo AAF with ESRD on HD with acute resp failure from acute angioedema ??etiology  PULMONARY-angioedema -Respiratory Failure -continue Full MV support -continue Bronchodilator Therapy -Wean Fio2 and PEEP as tolerated -continue decadron  10 TID   CARDIOVASCULAR Vasopressors as needed  RENAL Follow up Nephrology -follow up vasc surgery recs  GASTROINTESTINAL NG, will start TF's   HEMATOLOGIC follow CBC  INFECTIOUS No signs of infection at this time  ENDOCRINE - ICU hypoglycemic\Hyperglycemia protocol   NEUROLOGIC - intubated and sedated - minimal sedation to achieve a RASS goal: -1    I have personally obtained a history, examined the patient, evaluated laboratory and independently reviewed  imaging results, formulated the assessment and plan and placed orders.  The Patient requires high complexity decision making for assessment and support, frequent evaluation and titration of therapies, application of advanced monitoring technologies and extensive interpretation of multiple databases. Critical Care Time devoted to patient care services described in this note is 45 minutes.   Overall, patient is critically ill, prognosis is guarded. Patient at high risk for cardiac arrest and death.    Lucie LeatherKurian David Chiniqua Kilcrease, M.D.  [facility]  Medical Director [facility] Medical Director [facility]

## 2015-02-05 NOTE — Progress Notes (Signed)
[facility] - [place] [facility]   PATIENT NAME: [name]    MR#:  [id number]  DATE OF BIRTH:  November 09, 1945  SUBJECTIVE:  CHIEF COMPLAINT:  No chief complaint on file.  Currently intubated and sedated.  REVIEW OF SYSTEMS:   ROS unable to obtain due to intubation and sedation.  DRUG ALLERGIES:   Allergies  Allergen Reactions  . Contrast Media [Iodinated Diagnostic Agents] Anaphylaxis  . Oxygen Swelling and Rash    Capnography line oxygen tubing monitor    VITALS:  Blood pressure 149/76, pulse 62, temperature 97.9 F (36.6 C), temperature source Oral, resp. rate 14, height  (1.676 m), weight 58.06 kg (128 lb), SpO2 100 %.  PHYSICAL EXAMINATION:  GENERAL:  69 y.o.-year-old patient critically ill-appearing, on mechanical ventilation EYES: Pupils are small, minimally reactive, eyes are not tracking immediately together very slight deviation of the right eye, clear conjunctiva, some conjunctival edema HEENT: Head atraumatic, normocephalic. Oropharynx and nasopharynx clear.  NECK:  Supple, no jugular venous distention. No thyroid enlargement, no tenderness.  LUNGS: Normal breath sounds bilaterally, no wheezing, rales,rhonchi or crepitation. No use of accessory muscles of respiration. Coarse. Mechanical CARDIOVASCULAR: S1, S2 normal. No murmurs, rubs, or gallops.  ABDOMEN: Soft, nontender, nondistended. Bowel sounds present. No organomegaly or mass.  EXTREMITIES: No pedal edema, cyanosis, or clubbing.  NEUROLOGIC: Sedated.   LABORATORY PANEL:   CBC  Recent Labs Lab 02/05/15 0611  WBC 2.2*  HGB 12.5  HCT 39.1  PLT 135*   ------------------------------------------------------------------------------------------------------------------  Chemistries   Recent Labs Lab 02/05/15 0611  NA 134*  K 4.2  CL 94*  CO2 24  GLUCOSE 152*  BUN 65*  CREATININE 8.61*  CALCIUM 8.7*    ------------------------------------------------------------------------------------------------------------------  Cardiac Enzymes No results for input(s): TROPONINI in the last 168 hours. ------------------------------------------------------------------------------------------------------------------  RADIOLOGY:  X-ray Chest Pa Or Ap  02/04/2015  CLINICAL DATA:  Postop cardiac catheterization, the patient coded while in special procedures EXAM: CHEST 1 VIEW COMPARISON:  Portable chest x-ray of 12/02/2014 FINDINGS: The tip of the endotracheal tube is approximately 2.1 cm above the carina. The lungs appear well aerated. No infiltrate or effusion is seen. Cardiomegaly is stable. IMPRESSION: 1. Endotracheal tube tip 2.1 cm above the carina. 2. Improved aeration of the lungs. Electronically Signed   By: Dwyane Dee M.D.   On: 02/04/2015 13:26   Dg Abd 1 View  02/04/2015  CLINICAL DATA:  NG tube placement. EXAM: ABDOMEN - 1 VIEW COMPARISON:  12/21/2014 FINDINGS: The NG tube tip is in the fundal/ body region of the stomach. The bowel gas pattern is unremarkable. External pacer paddles are noted over the chest. IMPRESSION: NG tube tip is in the fundal/ body region of the stomach. Electronically Signed   By: Rudie Meyer M.D.   On: 02/04/2015 14:33    EKG:   Orders placed or performed during the hospital encounter of 12/21/14  . EKG 12-Lead  . EKG 12-Lead  . EKG    ASSESSMENT AND PLAN:   1 angioedema with respiratory compromise: - Continue Decadron and Benadryl - Pulmonology following, on full ventilator support - Trigger is unknown. I did review the photographs of the patient which were taken after her surgery showing welts over her face at the points of contact with the oxygen tubing. She has had a similar event in the past. We'll need to discuss with anesthesia  2 altered mental status: Patient is not responding even with light and sedation. She has a  history of seizure disorder and  also dementia. - Obtain neurology consultation - Obtain CT head - Continue neuro checks and assessments - Continue Keppra per Dr. Alver FisherShaw's note earlier this week.  - At baseline patient lives with her daughter. Is mobile. Is alert and mostly oriented.  3 ESRD on HD - Nephrology following. Continue hemodialysis in the room  4  Hypertension - Blood pressure controlled at this time. Continue metoprolol, isosorbide, hydralazine  5  Coronary artery disease - Continue aspirin Plavix  All the records are reviewed and case discussed with Care Management/Social Workerr. Management plans discussed with the patient, family and they are in agreement.  CODE STATUS: Full   TOTAL TIME TAKING CARE OF THIS PATIENT: 40 minutes.  Greater than 50% of time spent in care coordination and counseling. Care plan discussed with the patient's daughter at the bedside. POSSIBLE D/C IN ? DAYS, DEPENDING ON CLINICAL CONDITION.   Elby ShowersWALSH, CATHERINE M.D on 02/05/2015 at 5:20 PM  Between 7am to 6pm - Pager - [phone]  After 6pm go to [email or web address] - password [email or web address] [facility]  [facility]  Office  [phone]  CC: Primary care physician; Amy Diana EvesL Krebs, NP

## 2015-02-05 NOTE — Progress Notes (Signed)
   02/05/15 1500  Clinical Encounter Type  Visited With Patient and family together  Visit Type Follow-up  Consult/Referral To Chaplain  Spiritual Encounters  Spiritual Needs Emotional  Stress Factors  Patient Stress Factors Not reviewed  Family Stress Factors Health changes  Chaplain was in unit and patient's daughter came in and offered a listening ear and support. Notified nurse that daughter wanted to speak with her.  Chaplain Alias Villagran A. Shiron Whetsel Ext. [phone]

## 2015-02-05 NOTE — Progress Notes (Signed)
[facility]  Daily Progress Note   Subjective: 1 Day Post-Op: Left brachial artery to cephalic vein loop forearm arteriovenous graft cannulation under ultrasound guidance in both a retrograde and then antegrade fashion crossing, left arm shuntogram and central venogram, Catheter directed thrombolysis with 4 mg of TPA delivered with the AngioJet AVX catheter, mechanical rheolytic thrombectomy to the Left arm AV graft and cephalic vein beyond the graft with the AngioJet AVX catheter, fogarty embolectomy for residual arterial plug, percutaneous transluminal angioplasty of arterial anastomosis and proximal portion of the graft with 7 mm diameter by 8 cm length high pressure angioplasty balloon, percutaneous transluminal angioplasty of the distal graft and venous anastomosis with 2 inflations of the 7 mm diameter by 8 cm length high pressure angioplasty balloon with viabahn covered stent placement to the distal graft, venous anastomosis, and initial few centimeters of the cephalic vein with 8mm diameter by 15 cm length Viabahn covered stent.  Patient intubated and sedated. Eye and angioedema still presents. No rash / hives noted.   Objective: Filed Vitals:   02/05/15 0430 02/05/15 0500 02/05/15 0530 02/05/15 0600  BP: 118/66 114/67 111/70 131/74  Pulse: 55 56 56 64  Temp: 97.9 F (36.6 C)     TempSrc: Oral     Resp: Height:      Weight:      SpO2: 99% 99% 99% 100%    Intake/Output Summary (Last 24 hours) at 02/05/15 0823 Last data filed at 02/05/15 0600  Gross per 24 hour  Intake 444.76 ml  Output    249 ml  Net 195.76 ml    Physical Exam: Patient sedated and on vent Face: right eye edema and angioedema present CV: RRR Pulmonary: Intubated, CTA Bilaterally Abdomen: Soft, Nontender, Nondistended Skin: No rash / hives noted   Laboratory: CBC    Component Value Date/Time   WBC 2.2* 02/05/2015 0611   WBC 4.1 04/24/2014 1037   HGB 12.5 02/05/2015  0611   HGB 8.3* 04/24/2014 1037   HCT 39.1 02/05/2015 0611   HCT 25.1* 04/24/2014 1037   PLT 135* 02/05/2015 0611   PLT 123* 04/24/2014 1037   BMET    Component Value Date/Time   NA 134* 02/05/2015 0611   NA 133* 04/24/2014 1037   K 4.2 02/05/2015 0611   K 4.5 04/24/2014 1037   CL 94* 02/05/2015 0611   CL 93* 04/24/2014 1037   CO2 24 02/05/2015 0611   CO2 30 04/24/2014 1037   GLUCOSE 152* 02/05/2015 0611   GLUCOSE 229* 04/24/2014 1037   BUN 65* 02/05/2015 0611   BUN 48* 04/24/2014 1037   CREATININE 8.61* 02/05/2015 0611   CREATININE 6.12* 04/24/2014 1037   CALCIUM 8.7* 02/05/2015 0611   CALCIUM 8.1* 04/24/2014 1037   GFRNONAA 4* 02/05/2015 0611   GFRNONAA 7* 04/24/2014 1037   GFRNONAA 15* 07/16/2013 1750   GFRAA 5* 02/05/2015 0611   GFRAA 9* 04/24/2014 1037   GFRAA 18* 07/16/2013 1750   Assessment/Planning: 69 year old female POD #1 left arm fistulogram. Had anaphylactic reaction s/p procedure in PACU yesterday. Initial suspicion was from the oxygen tubing in the postoperative recovery area but not entirely clear what the source is. Possible contrast reaction or other medication reaction. Added contrast / iodine allergy to patients chart.  1) If she needs dialysis, her AV graft should be fine to use for her dialysis treatments. 2) Care as per primary team 3) Will continue to follow  [facility]  Raheel Kunkle PA-C 02/05/2015 8:23 AM

## 2015-02-05 NOTE — Progress Notes (Signed)
   02/05/15 0900  Clinical Encounter Type  Visited With Patient  Visit Type Follow-up  Consult/Referral To Chaplain  Spiritual Encounters  Spiritual Needs Emotional;Other (Comment)  Stress Factors  Patient Stress Factors None identified  Chaplain rounded in the unit and offered a compassionate presence.  Chaplain Lydiah Pong A. Danissa Rundle Ext. [phone]

## 2015-02-05 NOTE — Progress Notes (Signed)
[facility]  ROUNDING NOTE   Subjective:  Patient came here yesterday for a fistulogram. Thereafter she hadsignificant complication with respiratory failure and acute tongue swelling. She missed her dialysis treatment yesterday.   Objective:  Vital signs in last 24 hours:  Temp:  [97.6 F (36.4 C)-98.3 F (36.8 C)] 97.9 F (36.6 C) (11/18 1200) Pulse Rate:  [52-88] 62 (11/18 1200) Resp:  [0-27] 14 (11/18 1200) BP: (111-196)/(48-129) 149/76 mmHg (11/18 1200) SpO2:  [99 %-100 %] 100 % (11/18 1200) FiO2 (%):  [30 %] 30 % (11/18 0810)  Weight change:  Filed Weights   02/04/15 0934 02/04/15 1310  Weight: 58.06 kg (128 lb) 58.06 kg (128 lb)    Intake/Output: I/O last 3 completed shifts: In: 444.8 [I.V.:354.8; NG/GT:90] Out: 249 [Urine:49; Emesis/NG output:200]   Intake/Output this shift:  Total I/O In: 253 [P.O.:150; I.V.:103] Out: -   Physical Exam: General: Critically ill appearing  Head: Normocephalic, atraumatic. Moist oral mucosal membranes, tongue swelling noted.  Eyes: Anicteric  Neck: Supple, trachea midline  Lungs:  Clear to auscultation normal effort  Heart: Regular rate and rhythm no rubs  Abdomen:  Soft, nontender, BS present  Extremities:  trace peripheral edema.  Neurologic: Intubated/sedated  Skin: No lesions  Access: LUE AVG    Basic Metabolic Panel:  Recent Labs Lab 02/04/15 1731 02/05/15 0611  NA  --  134*  K  --  4.2  CL  --  94*  CO2  --  24  GLUCOSE  --  152*  BUN  --  65*  CREATININE 7.97* 8.61*  CALCIUM  --  8.7*    Liver Function Tests: No results for input(s): AST, ALT, ALKPHOS, BILITOT, PROT, ALBUMIN in the last 168 hours. No results for input(s): LIPASE, AMYLASE in the last 168 hours. No results for input(s): AMMONIA in the last 168 hours.  CBC:  Recent Labs Lab 02/04/15 1731 02/05/15 0611  WBC 4.1 2.2*  HGB 11.8* 12.5  HCT 36.0 39.1  MCV 87.8 87.7  PLT 119* 135*    Cardiac Enzymes: No results for  input(s): CKTOTAL, CKMB, CKMBINDEX, TROPONINI in the last 168 hours.  BNP: Invalid input(s): POCBNP  CBG:  Recent Labs Lab 02/04/15 1347 02/04/15 2330 02/05/15 0342 02/05/15 0716 02/05/15 1123  GLUCAP 162* 189* 156* 149* 131*    Microbiology: Results for orders placed or performed during the hospital encounter of 02/04/15  MRSA PCR Screening     Status: None   Collection Time: 02/04/15  2:05 PM  Result Value Ref Range Status   MRSA by PCR NEGATIVE NEGATIVE Final    Comment:        The GeneXpert MRSA Assay (FDA approved for NASAL specimens only), is one component of a comprehensive MRSA colonization surveillance program. It is not intended to diagnose MRSA infection nor to guide or monitor treatment for MRSA infections.     Coagulation Studies: No results for input(s): LABPROT, INR in the last 72 hours.  Urinalysis: No results for input(s): COLORURINE, LABSPEC, PHURINE, GLUCOSEU, HGBUR, BILIRUBINUR, KETONESUR, PROTEINUR, UROBILINOGEN, NITRITE, LEUKOCYTESUR in the last 72 hours.  Invalid input(s): APPERANCEUR    Imaging: X-ray Chest Pa Or Ap  02/04/2015  CLINICAL DATA:  Postop cardiac catheterization, the patient coded while in special procedures EXAM: CHEST 1 VIEW COMPARISON:  Portable chest x-ray of 12/02/2014 FINDINGS: The tip of the endotracheal tube is approximately 2.1 cm above the carina. The lungs appear well aerated. No infiltrate or effusion is seen. Cardiomegaly is stable.  IMPRESSION: 1. Endotracheal tube tip 2.1 cm above the carina. 2. Improved aeration of the lungs. Electronically Signed   By: Dwyane Dee M.D.   On: 02/04/2015 13:26   Dg Abd 1 View  02/04/2015  CLINICAL DATA:  NG tube placement. EXAM: ABDOMEN - 1 VIEW COMPARISON:  12/21/2014 FINDINGS: The NG tube tip is in the fundal/ body region of the stomach. The bowel gas pattern is unremarkable. External pacer paddles are noted over the chest. IMPRESSION: NG tube tip is in the fundal/ body region of  the stomach. Electronically Signed   By: Rudie Meyer M.D.   On: 02/04/2015 14:33     Medications:   . propofol (DIPRIVAN) infusion 30 mcg/kg/min (02/05/15 0852)   . antiseptic oral rinse  7 mL Mouth Rinse q12n4p  . antiseptic oral rinse  7 mL Mouth Rinse QID  . aspirin  81 mg Per Tube Daily  . atorvastatin  40 mg Oral q1800  . chlorhexidine  15 mL Mouth Rinse BID  . chlorhexidine gluconate  15 mL Mouth Rinse BID  . clopidogrel  75 mg Oral Daily  . dexamethasone  10 mg Intravenous 4 times per day  . diphenhydrAMINE  25 mg Intravenous Q8H  . feeding supplement (VITAL HIGH PROTEIN)  1,000 mL Per Tube Q24H  . free water  100 mL Per Tube 3 times per day  . heparin  5,000 Units Subcutaneous 3 times per day  . insulin aspart  0-15 Units Subcutaneous 6 times per day  . metoprolol tartrate  25 mg Oral BID  . pantoprazole  40 mg Oral Q1200  . vitamin C  500 mg Oral BID   sodium chloride, acetaminophen **OR** acetaminophen, alum & mag hydroxide-simeth, guaiFENesin-dextromethorphan, hydrALAZINE, hydrALAZINE, labetalol, metoprolol, morphine injection, ondansetron, oxyCODONE-acetaminophen, phenol, promethazine  Assessment/ Plan:  69 y.o. female past medical history of end-stage renal disease, myocardial infarction, CVA, hyperlipidemia, hypertension, diabetes mellitus, depression, secondary hyperparathyroidism, dementia, GERD, anemia chronic kidney disease who presented for a fistulogram and ended up having acute respiratory failure and acute tongue swelling/angioedema.  1.  End-stage renal disease on hemodialysis.  We will proceed with hemodialysis treatment at the bedside today.  We will perform gentle ultrafiltration of 1 kg.  2.  Acute respiratory failure/ngioedema.  The patient is currently maintained on the ventilator. There is significant tongue swelling.  Therefore she will likely be kept intubated for now.  3.  Anemia chronic kidney disease.  Hold Epogen for now given hemoglobin of  12.5.  4.  Secondary hyperparathyroidism.  We will check intact PTH and phosphorus with the dialysis.  5.  Thanks for consult.   LOS: 1 Verneda Hollopeter 11/18/20161:41 PM

## 2015-02-06 DIAGNOSIS — T783XXA Angioneurotic edema, initial encounter: Secondary | ICD-10-CM

## 2015-02-06 DIAGNOSIS — J9601 Acute respiratory failure with hypoxia: Secondary | ICD-10-CM

## 2015-02-06 LAB — GLUCOSE, CAPILLARY
GLUCOSE-CAPILLARY: 169 mg/dL — AB (ref 65–99)
GLUCOSE-CAPILLARY: 186 mg/dL — AB (ref 65–99)
GLUCOSE-CAPILLARY: 245 mg/dL — AB (ref 65–99)
Glucose-Capillary: 178 mg/dL — ABNORMAL HIGH (ref 65–99)
Glucose-Capillary: 237 mg/dL — ABNORMAL HIGH (ref 65–99)
Glucose-Capillary: 204 mg/dL — ABNORMAL HIGH (ref 65–99)

## 2015-02-06 LAB — CBC
HEMATOCRIT: 43.2 % (ref 35.0–47.0)
Hemoglobin: 13.7 g/dL (ref 12.0–16.0)
MCH: 27.5 pg (ref 26.0–34.0)
MCHC: 31.7 g/dL — AB (ref 32.0–36.0)
MCV: 86.8 fL (ref 80.0–100.0)
PLATELETS: 127 10*3/uL — AB (ref 150–440)
RBC: 4.98 MIL/uL (ref 3.80–5.20)
RDW: 22.6 % — AB (ref 11.5–14.5)
WBC: 5.3 10*3/uL (ref 3.6–11.0)

## 2015-02-06 LAB — RENAL FUNCTION PANEL
Albumin: 3.1 g/dL — ABNORMAL LOW (ref 3.5–5.0)
Anion gap: 16 — ABNORMAL HIGH (ref 5–15)
BUN: 57 mg/dL — ABNORMAL HIGH (ref 6–20)
CALCIUM: 8.4 mg/dL — AB (ref 8.9–10.3)
CHLORIDE: 95 mmol/L — AB (ref 101–111)
CO2: 21 mmol/L — ABNORMAL LOW (ref 22–32)
CREATININE: 7.66 mg/dL — AB (ref 0.44–1.00)
GFR, EST AFRICAN AMERICAN: 6 mL/min — AB (ref >60)
GFR, EST NON AFRICAN AMERICAN: 5 mL/min — AB (ref >60)
Glucose, Bld: 212 mg/dL — ABNORMAL HIGH (ref 65–99)
PHOSPHORUS: 6.9 mg/dL — AB (ref 2.5–4.6)
Potassium: 5.1 mmol/L (ref 3.5–5.1)
Sodium: 132 mmol/L — ABNORMAL LOW (ref 135–145)

## 2015-02-06 LAB — PHOSPHORUS: Phosphorus: 7.1 mg/dL — ABNORMAL HIGH (ref 2.5–4.6)

## 2015-02-06 MED ORDER — PENTAFLUOROPROP-TETRAFLUOROETH EX AERO: 1.0000 "application " | INHALATION_SPRAY | CUTANEOUS | Status: DC | PRN

## 2015-02-06 MED ORDER — HEPARIN SODIUM (PORCINE) 5000 UNIT/ML IJ SOLN
5000.0000 [IU] | Freq: Two times a day (BID) | INTRAMUSCULAR | Status: DC
  Administered 2015-02-06 – 2015-02-23 (×33): 5000 [IU] via SUBCUTANEOUS
  Filled 2015-02-06 (×34): qty 1

## 2015-02-06 MED ORDER — PANTOPRAZOLE SODIUM 40 MG PO PACK
40.0000 mg | PACK | Freq: Every day | ORAL | Status: DC
  Administered 2015-02-06 – 2015-02-09 (×3): 40 mg
  Filled 2015-02-06 (×3): qty 20

## 2015-02-06 MED ORDER — HYDRALAZINE HCL 50 MG PO TABS
50.0000 mg | ORAL_TABLET | Freq: Three times a day (TID) | ORAL | Status: DC
  Administered 2015-02-07 – 2015-02-09 (×5): 50 mg via ORAL
  Filled 2015-02-06 (×7): qty 1

## 2015-02-06 MED ORDER — LIDOCAINE HCL (PF) 1 % IJ SOLN: 5.0000 mL | INTRAMUSCULAR | Status: DC | PRN

## 2015-02-06 MED ORDER — LIDOCAINE-PRILOCAINE 2.5-2.5 % EX CREA: 1.0000 "application " | TOPICAL_CREAM | CUTANEOUS | Status: DC | PRN

## 2015-02-06 MED ORDER — SODIUM CHLORIDE 0.9 % IV SOLN: 100.0000 mL | INTRAVENOUS | Status: DC | PRN

## 2015-02-06 MED ORDER — ALTEPLASE 2 MG IJ SOLR
2.0000 mg | Freq: Once | INTRAMUSCULAR | Status: DC | PRN
  Filled 2015-02-06: qty 2

## 2015-02-06 MED ORDER — HEPARIN SODIUM (PORCINE) 1000 UNIT/ML DIALYSIS
1000.0000 [IU] | INTRAMUSCULAR | Status: DC | PRN
  Filled 2015-02-06: qty 1

## 2015-02-06 NOTE — Progress Notes (Signed)
[facility]  ROUNDING NOTE   Subjective:  Patient remains on vent. Still has tongue swelling.   Had HD yesterday. No net UF performed.    Objective:  Vital signs in last 24 hours:  Temp:  [96.6 F (35.9 C)-97.9 F (36.6 C)] 97.6 F (36.4 C) (11/19 0800) Pulse Rate:  [51-78] 68 (11/19 0500) Resp:  [13-27] 18 (11/19 0500) BP: (115-180)/(48-134) 172/87 mmHg (11/19 0500) SpO2:  [99 %-100 %] 100 % (11/19 0500) FiO2 (%):  [30 %] 30 % (11/19 0803) Weight:  [58 kg (127 lb 13.9 oz)] 58 kg (127 lb 13.9 oz) (11/19 0454)  Weight change: -0.06 kg (-2.1 oz) Filed Weights   02/04/15 0934 02/04/15 1310 02/06/15 0454  Weight: 58.06 kg (128 lb) 58.06 kg (128 lb) 58 kg (127 lb 13.9 oz)    Intake/Output: I/O last 3 completed shifts: In: 1778.6 [P.O.:150; I.V.:499; NG/GT:924.7; IV Piggyback:205] Out: -751 [Urine:49; Emesis/NG output:200]   Intake/Output this shift:     Physical Exam: General: Critically ill appearing  Head: Normocephalic, atraumatic. Moist oral mucosal membranes, tongue swelling noted.  Eyes: Anicteric  Neck: Supple, trachea midline  Lungs:  Clear to auscultation normal effort  Heart: S1S2 2/6 SEM  Abdomen:  Soft, nontender, BS present  Extremities:  trace peripheral edema.  Neurologic: Intubated/sedated  Skin: No lesions  Access: LUE AVG    Basic Metabolic Panel:  Recent Labs Lab 02/04/15 1731 02/05/15 0611  NA  --  134*  K  --  4.2  CL  --  94*  CO2  --  24  GLUCOSE  --  152*  BUN  --  65*  CREATININE 7.97* 8.61*  CALCIUM  --  8.7*    Liver Function Tests: No results for input(s): AST, ALT, ALKPHOS, BILITOT, PROT, ALBUMIN in the last 168 hours. No results for input(s): LIPASE, AMYLASE in the last 168 hours. No results for input(s): AMMONIA in the last 168 hours.  CBC:  Recent Labs Lab 02/04/15 1731 02/05/15 0611  WBC 4.1 2.2*  HGB 11.8* 12.5  HCT 36.0 39.1  MCV 87.8 87.7  PLT 119* 135*    Cardiac Enzymes: No results for  input(s): CKTOTAL, CKMB, CKMBINDEX, TROPONINI in the last 168 hours.  BNP: Invalid input(s): POCBNP  CBG:  Recent Labs Lab 02/05/15 1616 02/05/15 2000 02/06/15 0038 02/06/15 0408 02/06/15 0757  GLUCAP 135* 104* 169* 186* 178*    Microbiology: Results for orders placed or performed during the hospital encounter of 02/04/15  MRSA PCR Screening     Status: None   Collection Time: 02/04/15  2:05 PM  Result Value Ref Range Status   MRSA by PCR NEGATIVE NEGATIVE Final    Comment:        The GeneXpert MRSA Assay (FDA approved for NASAL specimens only), is one component of a comprehensive MRSA colonization surveillance program. It is not intended to diagnose MRSA infection nor to guide or monitor treatment for MRSA infections.     Coagulation Studies: No results for input(s): LABPROT, INR in the last 72 hours.  Urinalysis: No results for input(s): COLORURINE, LABSPEC, PHURINE, GLUCOSEU, HGBUR, BILIRUBINUR, KETONESUR, PROTEINUR, UROBILINOGEN, NITRITE, LEUKOCYTESUR in the last 72 hours.  Invalid input(s): APPERANCEUR    Imaging: X-ray Chest Pa Or Ap  02/04/2015  CLINICAL DATA:  Postop cardiac catheterization, the patient coded while in special procedures EXAM: CHEST 1 VIEW COMPARISON:  Portable chest x-ray of 12/02/2014 FINDINGS: The tip of the endotracheal tube is approximately 2.1 cm above the carina.  The lungs appear well aerated. No infiltrate or effusion is seen. Cardiomegaly is stable. IMPRESSION: 1. Endotracheal tube tip 2.1 cm above the carina. 2. Improved aeration of the lungs. Electronically Signed   By: Dwyane DeePaul  Barry M.D.   On: 02/04/2015 13:26   Dg Abd 1 View  02/04/2015  CLINICAL DATA:  NG tube placement. EXAM: ABDOMEN - 1 VIEW COMPARISON:  12/21/2014 FINDINGS: The NG tube tip is in the fundal/ body region of the stomach. The bowel gas pattern is unremarkable. External pacer paddles are noted over the chest. IMPRESSION: NG tube tip is in the fundal/ body region of  the stomach. Electronically Signed   By: Rudie MeyerP.  Gallerani M.D.   On: 02/04/2015 14:33   Ct Head Wo Contrast  02/06/2015  CLINICAL DATA:  Coded after vascular procedure for thrombosed AV graft. Acute respiratory failure, end-stage renal disease on dialysis. History of stroke, hypertension, diabetes, dementia. EXAM: CT HEAD WITHOUT CONTRAST TECHNIQUE: Contiguous axial images were obtained from the base of the skull through the vertex without intravenous contrast. COMPARISON:  CT head December 02, 2014 FINDINGS: No intraparenchymal hemorrhage, mass effect, midline shift or acute large vascular territory infarcts. LEFT parietal encephalomalacia, LEFT occipital lobe encephalomalacia, unchanged. Old bilateral thalamus lacunar infarcts. Patchy to confluent supratentorial white matter hypodensities are unchanged. No abnormal extra-axial fluid collections. Nasogastric tube via LEFT nares . Severe calcific atherosclerosis of the carotid siphons. Imaged paranasal sinuses and mastoid air cells are well aerated. Included ocular globes and orbital contents are normal. IMPRESSION: No acute intracranial process. 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 Electronically Signed   By: Awilda Metroourtnay  Bloomer M.D.   On: 02/06/2015 00:18     Medications:   . propofol (DIPRIVAN) infusion 25.244 mcg/kg/min (02/06/15 0510)   . antiseptic oral rinse  7 mL Mouth Rinse q12n4p  . antiseptic oral rinse  7 mL Mouth Rinse QID  . aspirin  81 mg Per Tube Daily  . atorvastatin  40 mg Oral q1800  . chlorhexidine  15 mL Mouth Rinse BID  . chlorhexidine gluconate  15 mL Mouth Rinse BID  . clopidogrel  75 mg Oral Daily  . dexamethasone  10 mg Intravenous 4 times per day  . diphenhydrAMINE  25 mg Intravenous Q8H  . feeding supplement (VITAL HIGH PROTEIN)  1,000 mL Per Tube Q24H  . free water  100 mL Per Tube  3 times per day  . heparin  5,000 Units Subcutaneous 3 times per day  . hydrALAZINE  50 mg Oral 3 times per day  . insulin aspart  0-15 Units Subcutaneous 6 times per day  . isosorbide mononitrate  30 mg Oral Daily  . levETIRAcetam  500 mg Intravenous Q12H  . metoprolol tartrate  12.5 mg Oral BID  . pantoprazole  40 mg Oral Q1200  . vitamin C  500 mg Oral BID   sodium chloride, acetaminophen **OR** acetaminophen, alum & mag hydroxide-simeth, guaiFENesin-dextromethorphan, hydrALAZINE, hydrALAZINE, labetalol, metoprolol, morphine injection, ondansetron, oxyCODONE-acetaminophen, phenol, promethazine  Assessment/ Plan:  69 y.o. female past medical history of end-stage renal disease, myocardial infarction, CVA, hyperlipidemia, hypertension, diabetes mellitus, depression, secondary hyperparathyroidism, dementia, GERD, anemia chronic kidney disease who presented for a fistulogram and ended up having acute respiratory failure and acute tongue swelling/angioedema.  1.  End-stage renal disease on hemodialysis.  Pt had HD yesterday, no acute indication for HD today, will plan for HD again  on Monday.  2.  Acute respiratory failure/angioedema.  Continue vent support for now, still has tongue swelling.    3.  Anemia chronic kidney disease.  Continue to hold epogen.    4.  Secondary hyperparathyroidism.  Check phos today, will order PTH with next HD.   LOS: 2 Averie Hornbaker 11/19/20168:29 AM

## 2015-02-06 NOTE — Progress Notes (Signed)
Spoke with nephrologist, Dr. Cherylann RatelLateef, about lab values. MD does not plan to replace any today. Will re-evaluate tomorrow.

## 2015-02-06 NOTE — Progress Notes (Signed)
Nutrition Follow-up    INTERVENTION:   EN: with current diprivan, recommend continuing current TF regimen. Continue to assess   NUTRITION DIAGNOSIS:   Inadequate oral intake related to acute illness as evidenced by NPO status.  GOAL:   Provide needs based on ASPEN/SCCM guidelines  MONITOR:    (Energy Intake, Anthropometrics, Electrolyte/Renal Profile, Glucose Profile, Pulmonary)  REASON FOR ASSESSMENT:   Ventilator, Consult Enteral/tube feeding initiation and management  ASSESSMENT:   Pt remains on vent, still has tongue swelling, HD yesterday with no net UF, no plan for HD today  EN: tolerating Vital High Protein at rate of 40 ml/hr  Digestive System: no signs of TF intolerance, abdomen soft, BS hypoactive, no BM  Electrolyte and Renal Profile:  Recent Labs Lab 02/04/15 1731 02/05/15 0611 02/06/15 0827  BUN  --  65* 57*  CREATININE 7.97* 8.61* 7.66*  NA  --  134* 132*  K  --  4.2 5.1  PHOS  --   --  7.1*  6.9*   Glucose Profile:  Recent Labs  02/06/15 0038 02/06/15 0408 02/06/15 0757  GLUCAP 169* 186* 178*   Meds: diprivan (estimated 227 kcals in past 24 hours),  Decacdron, ss novolog  Skin:  Reviewed, no issues  Height:   Ht Readings from Last 1 Encounters:  02/04/15 5\' 6"  (1.676 m)    Weight:   Wt Readings from Last 1 Encounters:  02/06/15 127 lb 13.9 oz (58 kg)   Filed Weights   02/04/15 0934 02/04/15 1310 02/06/15 0454  Weight: 128 lb (58.06 kg) 128 lb (58.06 kg) 127 lb 13.9 oz (58 kg)    BMI:  Body mass index is 20.65 kg/(m^2).  Estimated Nutritional Needs:   Kcal:  1211 kcals (BEE 1128, Ve: 6.3, Tmax: 36.8) using wt of 58.1 kg  Protein:  70-116 g (1.2-2.0 g/kg)   Fluid:  1000 mL plus UOP  HIGH Care Level  Romelle Starcherate Libia Fazzini MS, RD, LDN [phone] Pager

## 2015-02-06 NOTE — Progress Notes (Signed)
[facility] [place] Critical Care Medicine Progess Note    ASSESSMENT/PLAN    69 yo AAF with ESRD on HD with acute resp failure from acute angioedema ??etiology  PULMONARY-angioedema, this appears to be slowly improving with reduced swelling compared to yesterday. However, the swelling still does appear to be significant. -Respiratory Failure -continue Full MV support, no weaning today due to continued likely airway compromise. -continue Bronchodilator Therapy -Wean Fio2 and PEEP as tolerated -continue decadron 10 TID   CARDIOVASCULAR Vasopressors as needed  RENAL End-stage renal disease on hemodialysis. Nephrology is following. -follow up vasc surgery recs  GASTROINTESTINAL NG, continue TF's   HEMATOLOGIC follow CBC Heparin for DVT prophylaxis.  INFECTIOUS No signs of infection at this time  ENDOCRINE - ICU hypoglycemic\Hyperglycemia protocol   NEUROLOGIC - intubated and sedated - minimal sedation to achieve a RASS goal: -1   ---------------------------------------   ----------------------------------------   Name: [name] MRN: [id number] DOB: 10/02/1945    ADMISSION DATE:  02/04/2015    CHIEF COMPLAINT:  Dyspnea    SUBJECTIVE:   Pt currently on the ventilator, can not provide history or review of systems.   Review of Systems:  Pt currently on the ventilator, can not provide history or review of systems.    VITAL SIGNS: Temp:  [96.6 F (35.9 C)-97.9 F (36.6 C)] 97.6 F (36.4 C) (11/19 0800) Pulse Rate:  [51-79] 64 (11/19 1000) Resp:  [14-21] 14 (11/19 1000) BP: (115-191)/(61-134) 117/61 mmHg (11/19 1000) SpO2:  [99 %-100 %] 100 % (11/19 1000) FiO2 (%):  [30 %] 30 % (11/19 1100) Weight:  [127 lb 13.9 oz (58 kg)] 127 lb 13.9 oz (58 kg) (11/19 0454) HEMODYNAMICS:   VENTILATOR SETTINGS: Vent Mode:  [-] PRVC FiO2 (%):  [30 %] 30 % Set Rate:  [14 bmp] 14 bmp Vt Set:  [450 mL] 450 mL PEEP:  [5 cmH20] 5 cmH20 INTAKE /  OUTPUT:  Intake/Output Summary (Last 24 hours) at 02/06/15 1121 Last data filed at 02/06/15 0900  Gross per 24 hour  Intake   1506 ml  Output   -968 ml  Net   2474 ml    PHYSICAL EXAMINATION: Physical Examination:   VS: BP 117/61 mmHg  Pulse 64  Temp(Src) 97.6 F (36.4 C) (Oral)  Resp 14  Ht 5\' 6"  (1.676 m)  Wt 127 lb 13.9 oz (58 kg)  BMI 20.65 kg/m2  SpO2 100%  General Appearance: No distress  Neuro:without focal findings, mental status normal. HEENT: PERRLA, EOM intact. Continued tongue and facial swelling. Pulmonary: normal breath sounds   CardiovascularNormal S1,S2.  No m/r/g.   Abdomen: Benign, Soft, non-tender. Renal:  No costovertebral tenderness  GU:  Not performed at this time. Endocrine: No evident thyromegaly. Skin:   warm, no rashes, no ecchymosis  Extremities: normal, no cyanosis, clubbing.   LABS:   LABORATORY PANEL:   CBC  Recent Labs Lab 02/06/15 0827  WBC 5.3  HGB 13.7  HCT 43.2  PLT 127*    Chemistries   Recent Labs Lab 02/06/15 0827  NA 132*  K 5.1  CL 95*  CO2 21*  GLUCOSE 212*  BUN 57*  CREATININE 7.66*  CALCIUM 8.4*  PHOS 7.1*  6.9*     Recent Labs Lab 02/05/15 1123 02/05/15 1616 02/05/15 2000 02/06/15 0038 02/06/15 0408 02/06/15 0757  GLUCAP 131* 135* 104* 169* 186* 178*    Recent Labs Lab 02/04/15 1705  PHART 7.45  PCO2ART 36  PO2ART 132*    Recent Labs Lab 02/06/15  0827  ALBUMIN 3.1*    Cardiac Enzymes No results for input(s): TROPONINI in the last 168 hours.  RADIOLOGY:  X-ray Chest Pa Or Ap  02/04/2015  CLINICAL DATA:  Postop cardiac catheterization, the patient coded while in special procedures EXAM: CHEST 1 VIEW COMPARISON:  Portable chest x-ray of 12/02/2014 FINDINGS: The tip of the endotracheal tube is approximately 2.1 cm above the carina. The lungs appear well aerated. No infiltrate or effusion is seen. Cardiomegaly is stable. IMPRESSION: 1. Endotracheal tube tip 2.1 cm above the  carina. 2. Improved aeration of the lungs. Electronically Signed   By: Dwyane Dee M.D.   On: 02/04/2015 13:26   Dg Abd 1 View  02/04/2015  CLINICAL DATA:  NG tube placement. EXAM: ABDOMEN - 1 VIEW COMPARISON:  12/21/2014 FINDINGS: The NG tube tip is in the fundal/ body region of the stomach. The bowel gas pattern is unremarkable. External pacer paddles are noted over the chest. IMPRESSION: NG tube tip is in the fundal/ body region of the stomach. Electronically Signed   By: Rudie Meyer M.D.   On: 02/04/2015 14:33   Ct Head Wo Contrast  02/14/15  CLINICAL DATA:  Coded after vascular procedure for thrombosed AV graft. Acute respiratory failure, end-stage renal disease on dialysis. History of stroke, hypertension, diabetes, dementia. EXAM: CT HEAD WITHOUT CONTRAST TECHNIQUE: Contiguous axial images were obtained from the base of the skull through the vertex without intravenous contrast. COMPARISON:  CT head December 02, 2014 FINDINGS: No intraparenchymal hemorrhage, mass effect, midline shift or acute large vascular territory infarcts. LEFT parietal encephalomalacia, LEFT occipital lobe encephalomalacia, unchanged. Old bilateral thalamus lacunar infarcts. Patchy to confluent supratentorial white matter hypodensities are unchanged. No abnormal extra-axial fluid collections. Nasogastric tube via LEFT nares . Severe calcific atherosclerosis of the carotid siphons. Imaged paranasal sinuses and mastoid air cells are well aerated. Included ocular globes and orbital contents are normal. IMPRESSION: No acute intracranial process. 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 Electronically Signed   By: Awilda Metro M.D.   On: 02-14-2015 00:18       --Wells Guiles, MD.  Pager [phone] [facility] Office Number: [phone]  Santiago Glad,  M.D.  Stephanie Acre, M.D.  Billy Fischer, M.D

## 2015-02-06 NOTE — Progress Notes (Signed)
Vascular Surgery  Remain intubated. Light sedation follows commands.  Tolerated HD via left brachiocephalic graft yesterday.  Wean vent as tolerated.  Continue HD per nephro.

## 2015-02-06 NOTE — Progress Notes (Signed)
[facility] - [place] at [facility]   PATIENT NAME: [name]    MR#:  [id number]  DATE OF BIRTH:  13-Jun-1945  SUBJECTIVE:  CHIEF COMPLAINT:  No chief complaint on file.  Remains intubated and sedated.  REVIEW OF SYSTEMS:   ROS unable to obtain due to intubation and sedation.  DRUG ALLERGIES:   Allergies  Allergen Reactions  . Contrast Media [Iodinated Diagnostic Agents] Anaphylaxis  . Oxygen Swelling and Rash    Capnography line oxygen tubing monitor    VITALS:  Blood pressure 114/65, pulse 63, temperature 97.4 F (36.3 C), temperature source Oral, resp. rate 0, height  (1.676 m), weight 58 kg (127 lb 13.9 oz), SpO2 100 %.  PHYSICAL EXAMINATION:  GENERAL:  69 y.o.-year-old patient critically ill-appearing, on mechanical ventilation EYES: Pupils are small, minimally reactive, eyes are not tracking immediately together very slight deviation of the right eye, clear conjunctiva, some conjunctival edema HEENT: Head atraumatic, normocephalic. Lips still very swollen, tongue seems less swollen still protruding NECK:  Supple, no jugular venous distention. No thyroid enlargement, no tenderness.  LUNGS: Normal breath sounds bilaterally, no wheezing, rales,rhonchi or crepitation. No use of accessory muscles of respiration. Coarse. Mechanical CARDIOVASCULAR: S1, S2 normal. No murmurs, rubs, or gallops.  ABDOMEN: Soft, nontender, nondistended. Bowel sounds present. No organomegaly or mass.  EXTREMITIES: No pedal edema, cyanosis, or clubbing.  NEUROLOGIC: Sedated.   LABORATORY PANEL:   CBC  Recent Labs Lab 02-11-15 0827  WBC 5.3  HGB 13.7  HCT 43.2  PLT 127*   ------------------------------------------------------------------------------------------------------------------  Chemistries   Recent Labs Lab Feb 11, 2015 0827  NA 132*  K 5.1  CL 95*  CO2 21*  GLUCOSE 212*  BUN 57*  CREATININE 7.66*  CALCIUM 8.4*    ------------------------------------------------------------------------------------------------------------------  Cardiac Enzymes No results for input(s): TROPONINI in the last 168 hours. ------------------------------------------------------------------------------------------------------------------  RADIOLOGY:  Dg Abd 1 View  02/04/2015  CLINICAL DATA:  NG tube placement. EXAM: ABDOMEN - 1 VIEW COMPARISON:  12/21/2014 FINDINGS: The NG tube tip is in the fundal/ body region of the stomach. The bowel gas pattern is unremarkable. External pacer paddles are noted over the chest. IMPRESSION: NG tube tip is in the fundal/ body region of the stomach. Electronically Signed   By: Rudie Meyer M.D.   On: 02/04/2015 14:33   Ct Head Wo Contrast  02-11-2015  CLINICAL DATA:  Coded after vascular procedure for thrombosed AV graft. Acute respiratory failure, end-stage renal disease on dialysis. History of stroke, hypertension, diabetes, dementia. EXAM: CT HEAD WITHOUT CONTRAST TECHNIQUE: Contiguous axial images were obtained from the base of the skull through the vertex without intravenous contrast. COMPARISON:  CT head December 02, 2014 FINDINGS: No intraparenchymal hemorrhage, mass effect, midline shift or acute large vascular territory infarcts. LEFT parietal encephalomalacia, LEFT occipital lobe encephalomalacia, unchanged. Old bilateral thalamus lacunar infarcts. Patchy to confluent supratentorial white matter hypodensities are unchanged. No abnormal extra-axial fluid collections. Nasogastric tube via LEFT nares . Severe calcific atherosclerosis of the carotid siphons. Imaged paranasal sinuses and mastoid air cells are well aerated. Included ocular globes and orbital contents are normal. IMPRESSION: No acute intracranial process. 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  Electronically Signed   By: Awilda Metro M.D.   On: 02/11/2015 00:18    EKG:   Orders placed or performed during the hospital encounter of 12/21/14  . EKG 12-Lead  .  EKG 12-Lead  . EKG    ASSESSMENT AND PLAN:   1 angioedema with respiratory compromise: - Continue Decadron and Benadryl - Pulmonology following, continues on full ventilator support - Trigger is unknown. I did review the photographs of the patient which were taken after her surgery showing welts over her face at the points of contact with the oxygen tubing. She has had a similar event in the past. We'll need to discuss with anesthesia. She will likely need to see an allergist after discharge.  2 altered mental status: Improving. Now with intentional movement when sedation is decreased. - Obtain neurology consultation pending - CT head no acute changes - Continue neuro checks and assessments - Continue Keppra per Dr. Alver FisherShaw's note earlier this week.  - At baseline patient lives with her daughter. Is mobile. Is alert and mostly oriented.  3 ESRD on HD - Nephrology following. Continue hemodialysis   4  Hypertension - Blood pressure slightly low at this time. Continue metoprolol, isosorbide, hydralazine. Adjust as needed.  5  Coronary artery disease - Continue aspirin Plavix  All the records are reviewed and case discussed with Care Management/Social Workerr. Management plans discussed with the patient, family and they are in agreement.  CODE STATUS: Full   TOTAL TIME TAKING CARE OF THIS PATIENT: 40 minutes.  Greater than 50% of time spent in care coordination and counseling. Care plan discussed with the patient's daughter at the bedside. POSSIBLE D/C IN ? DAYS, DEPENDING ON CLINICAL CONDITION.   Elby ShowersWALSH, CATHERINE M.D on 02/06/2015 at 2:15 PM  Between 7am to 6pm - Pager - [phone]  After 6pm go to [email or web address] - password [email or web address]  [facility]  Office  [phone]  CC: Primary  care physician; Amy Diana EvesL Krebs, NP

## 2015-02-07 LAB — CBC
HCT: 36.2 % (ref 35.0–47.0)
Hemoglobin: 11.7 g/dL — ABNORMAL LOW (ref 12.0–16.0)
MCH: 28.2 pg (ref 26.0–34.0)
MCHC: 32.3 g/dL (ref 32.0–36.0)
MCV: 87.2 fL (ref 80.0–100.0)
PLATELETS: 119 10*3/uL — AB (ref 150–440)
RBC: 4.15 MIL/uL (ref 3.80–5.20)
RDW: 22.4 % — AB (ref 11.5–14.5)
WBC: 4 10*3/uL (ref 3.6–11.0)

## 2015-02-07 LAB — GLUCOSE, CAPILLARY
GLUCOSE-CAPILLARY: 187 mg/dL — AB (ref 65–99)
GLUCOSE-CAPILLARY: 189 mg/dL — AB (ref 65–99)
GLUCOSE-CAPILLARY: 194 mg/dL — AB (ref 65–99)
GLUCOSE-CAPILLARY: 202 mg/dL — AB (ref 65–99)
Glucose-Capillary: 228 mg/dL — ABNORMAL HIGH (ref 65–99)
Glucose-Capillary: 177 mg/dL — ABNORMAL HIGH (ref 65–99)
Glucose-Capillary: 211 mg/dL — ABNORMAL HIGH (ref 65–99)

## 2015-02-07 LAB — BASIC METABOLIC PANEL
ANION GAP: 14 (ref 5–15)
BUN: 74 mg/dL — ABNORMAL HIGH (ref 6–20)
CALCIUM: 7.7 mg/dL — AB (ref 8.9–10.3)
CO2: 25 mmol/L (ref 22–32)
CREATININE: 8.23 mg/dL — AB (ref 0.44–1.00)
Chloride: 94 mmol/L — ABNORMAL LOW (ref 101–111)
GFR calc Af Amer: 5 mL/min — ABNORMAL LOW (ref >60)
GFR, EST NON AFRICAN AMERICAN: 4 mL/min — AB (ref >60)
GLUCOSE: 243 mg/dL — AB (ref 65–99)
Potassium: 4.7 mmol/L (ref 3.5–5.1)
Sodium: 133 mmol/L — ABNORMAL LOW (ref 135–145)

## 2015-02-07 LAB — TRIGLYCERIDES: Triglycerides: 32 mg/dL (ref <150)

## 2015-02-07 MED ORDER — SODIUM CHLORIDE 0.9 % IV SOLN
500.0000 mg | INTRAVENOUS | Status: DC
  Administered 2015-02-07 – 2015-02-09 (×2): 500 mg via INTRAVENOUS
  Filled 2015-02-07 (×2): qty 5

## 2015-02-07 MED ORDER — SODIUM CHLORIDE 0.9 % IV SOLN: 500.0000 mg | INTRAVENOUS | Status: DC

## 2015-02-07 MED ORDER — LEVETIRACETAM 500 MG/5ML IV SOLN
1000.0000 mg | INTRAVENOUS | Status: DC
  Administered 2015-02-08: 1000 mg via INTRAVENOUS
  Filled 2015-02-07 (×2): qty 10

## 2015-02-07 MED ORDER — LEVETIRACETAM 500 MG/5ML IV SOLN
500.0000 mg | INTRAVENOUS | Status: DC
  Administered 2015-02-09 – 2015-02-10 (×2): 500 mg via INTRAVENOUS
  Filled 2015-02-07 (×3): qty 5

## 2015-02-07 MED ORDER — CALCIUM ACETATE (PHOS BINDER) 667 MG/5ML PO SOLN
1334.0000 mg | Freq: Three times a day (TID) | ORAL | Status: DC
  Administered 2015-02-07 – 2015-02-16 (×29): 1334 mg
  Filled 2015-02-07 (×35): qty 10

## 2015-02-07 NOTE — Progress Notes (Signed)
Pt sedation turned off at 1115, started on SBT 10/5 at 1130. Beginning Co2 31. Pt tolerating SBT well and sedation remains off at this time. Will continue to monitor.

## 2015-02-07 NOTE — Progress Notes (Signed)
[facility]  ROUNDING NOTE   Subjective:  Still has some tongue protrusion and swelling. Remains on the ventilator. Plans for HD again tomorrow.   Objective:  Vital signs in last 24 hours:  Temp:  [95.6 F (35.3 C)-98.9 F (37.2 C)] 98.5 F (36.9 C) (11/20 0800) Pulse Rate:  [52-69] 60 (11/20 1000) Resp:  [0-16] 15 (11/20 1000) BP: (93-144)/(56-76) 109/63 mmHg (11/20 1000) SpO2:  [98 %-100 %] 100 % (11/20 1000) FiO2 (%):  [28 %-30 %] 28 % (11/20 0838)  Weight change:  Filed Weights   02/04/15 0934 02/04/15 1310 02-07-2015 0454  Weight: 58.06 kg (128 lb) 58.06 kg (128 lb) 58 kg (127 lb 13.9 oz)    Intake/Output: I/O last 3 completed shifts: In: 2718.4 [I.V.:242.1; NG/GT:2161.3; IV Piggyback:315] Out: -939 [Urine:61]   Intake/Output this shift:  Total I/O In: 201 [I.V.:21; NG/GT:180] Out: 10 [Urine:10]  Physical Exam: General: Critically ill appearing  Head: Normocephalic, atraumatic. Moist oral mucosal membranes, tongue swelling and protrusion noted.  Eyes: Anicteric  Neck: Supple, trachea midline  Lungs:  Clear to auscultation normal effort  Heart: S1S2 2/6 SEM  Abdomen:  Soft, nontender, BS present  Extremities:  trace peripheral edema.  Neurologic: Intubated/sedated  Skin: No lesions  Access: LUE AVG    Basic Metabolic Panel:  Recent Labs Lab 02/04/15 1731 02/05/15 0611 2015-02-07 0827 02/07/15 0211  NA  --  134* 132* 133*  K  --  4.2 5.1 4.7  CL  --  94* 95* 94*  CO2  --  24 21* 25  GLUCOSE  --  152* 212* 243*  BUN  --  65* 57* 74*  CREATININE 7.97* 8.61* 7.66* 8.23*  CALCIUM  --  8.7* 8.4* 7.7*  PHOS  --   --  7.1*  6.9*  --     Liver Function Tests:  Recent Labs Lab 02-07-15 0827  ALBUMIN 3.1*   No results for input(s): LIPASE, AMYLASE in the last 168 hours. No results for input(s): AMMONIA in the last 168 hours.  CBC:  Recent Labs Lab 02/04/15 1731 02/05/15 0611 02-07-15 0827 02/07/15 0211  WBC 4.1 2.2* 5.3 4.0   HGB 11.8* 12.5 13.7 11.7*  HCT 36.0 39.1 43.2 36.2  MCV 87.8 87.7 86.8 87.2  PLT 119* 135* 127* 119*    Cardiac Enzymes: No results for input(s): CKTOTAL, CKMB, CKMBINDEX, TROPONINI in the last 168 hours.  BNP: Invalid input(s): POCBNP  CBG:  Recent Labs Lab 02/07/15 1556 02-07-15 2028 2015/02/07 2352 02/07/15 0346 02/07/15 0758  GLUCAP 237* 204* 194* 228* 177*    Microbiology: Results for orders placed or performed during the hospital encounter of 02/04/15  MRSA PCR Screening     Status: None   Collection Time: 02/04/15  2:05 PM  Result Value Ref Range Status   MRSA by PCR NEGATIVE NEGATIVE Final    Comment:        The GeneXpert MRSA Assay (FDA approved for NASAL specimens only), is one component of a comprehensive MRSA colonization surveillance program. It is not intended to diagnose MRSA infection nor to guide or monitor treatment for MRSA infections.     Coagulation Studies: No results for input(s): LABPROT, INR in the last 72 hours.  Urinalysis: No results for input(s): COLORURINE, LABSPEC, PHURINE, GLUCOSEU, HGBUR, BILIRUBINUR, KETONESUR, PROTEINUR, UROBILINOGEN, NITRITE, LEUKOCYTESUR in the last 72 hours.  Invalid input(s): APPERANCEUR    Imaging: Ct Head Wo Contrast  02/07/15  CLINICAL DATA:  Coded after vascular procedure for thrombosed  AV graft. Acute respiratory failure, end-stage renal disease on dialysis. History of stroke, hypertension, diabetes, dementia. EXAM: CT HEAD WITHOUT CONTRAST TECHNIQUE: Contiguous axial images were obtained from the base of the skull through the vertex without intravenous contrast. COMPARISON:  CT head December 02, 2014 FINDINGS: No intraparenchymal hemorrhage, mass effect, midline shift or acute large vascular territory infarcts. LEFT parietal encephalomalacia, LEFT occipital lobe encephalomalacia, unchanged. Old bilateral thalamus lacunar infarcts. Patchy to confluent supratentorial white matter hypodensities are  unchanged. No abnormal extra-axial fluid collections. Nasogastric tube via LEFT nares . Severe calcific atherosclerosis of the carotid siphons. Imaged paranasal sinuses and mastoid air cells are well aerated. Included ocular globes and orbital contents are normal. IMPRESSION: No acute intracranial process. 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 Electronically Signed   By: Awilda Metroourtnay  Bloomer M.D.   On: 02/06/2015 00:18     Medications:   . propofol (DIPRIVAN) infusion 20 mcg/kg/min (02/07/15 0800)   . antiseptic oral rinse  7 mL Mouth Rinse QID  . aspirin  81 mg Per Tube Daily  . atorvastatin  40 mg Oral q1800  . chlorhexidine gluconate  15 mL Mouth Rinse BID  . clopidogrel  75 mg Oral Daily  . dexamethasone  10 mg Intravenous 4 times per day  . diphenhydrAMINE  25 mg Intravenous Q8H  . feeding supplement (VITAL HIGH PROTEIN)  1,000 mL Per Tube Q24H  . free water  100 mL Per Tube 3 times per day  . heparin subcutaneous  5,000 Units Subcutaneous Q12H  . hydrALAZINE  50 mg Oral 3 times per day  . insulin aspart  0-15 Units Subcutaneous 6 times per day  . isosorbide mononitrate  30 mg Oral Daily  . levETIRAcetam  500 mg Intravenous Q12H  . metoprolol tartrate  12.5 mg Oral BID  . pantoprazole sodium  40 mg Per Tube Daily  . vitamin C  500 mg Oral BID   acetaminophen **OR** acetaminophen, alteplase, alum & mag hydroxide-simeth, guaiFENesin-dextromethorphan, heparin, lidocaine (PF), lidocaine-prilocaine, morphine injection, ondansetron, oxyCODONE-acetaminophen, pentafluoroprop-tetrafluoroeth, phenol  Assessment/ Plan:  69 y.o. female past medical history of end-stage renal disease, myocardial infarction, CVA, hyperlipidemia, hypertension, diabetes mellitus, depression, secondary hyperparathyroidism, dementia, GERD, anemia chronic kidney disease who presented  for a fistulogram and ended up having acute respiratory failure and acute tongue swelling/angioedema.  1.  End-stage renal disease on hemodialysis.  Last HD was on Friday, no acute indication for HD today, will plan for HD again tomorrow.  2.  Acute respiratory failure/angioedema.  Cause of angioedema unclear, contrast vs oxygen tubing?  -tongue swelling and protrusion persists, continue steroids, mechanical ventilation per pulm/cc.  3.  Anemia chronic kidney disease.  Continue to hold epogen, reassess for need by monitoring CBC.    4.  Secondary hyperparathyroidism.  Phos high at 7.1, start phoslyra solution.    LOS: 3 Daemion Mcniel 11/20/201610:36 AM

## 2015-02-07 NOTE — Progress Notes (Signed)
16:00 SBT complete, patient tachycardic-120s, B/P elevated (SBP 195). Placed back on rate, Propofol restarted at 1/2 doseage at . 1630 SBP 120

## 2015-02-07 NOTE — Progress Notes (Signed)
[facility]  Critical Care Medicine Progess Note    ASSESSMENT/PLAN    69 yo AAF with ESRD on HD with acute resp failure from acute angioedema ??etiology  PULMONARY-angioedema, this appears to be slowly improving with reduced swelling compared to yesterday in the patient's face. However, the swelling still does appear to be significant in the patient's tongue. -Respiratory Failure -continue Full MV support, no weaning today due to continued likely airway compromise. -continue Bronchodilator Therapy -Wean Fio2 and PEEP as tolerated -continue decadron 10 TID   CARDIOVASCULAR Vasopressors as needed  RENAL End-stage renal disease on hemodialysis. Nephrology is following. -follow up vasc surgery recs  GASTROINTESTINAL NG, continue TF's   HEMATOLOGIC follow CBC Heparin for DVT prophylaxis.  INFECTIOUS No signs of infection at this time  ENDOCRINE - ICU hypoglycemic\Hyperglycemia protocol   NEUROLOGIC - intubated and sedated - minimal sedation to achieve a RASS goal: -1   ---------------------------------------   ----------------------------------------   Name: [name] MRN: [id number] DOB: 09/28/1945    ADMISSION DATE:  02/04/2015    CHIEF COMPLAINT:  Dyspnea    SUBJECTIVE:   Pt currently on the ventilator, can not provide history or review of systems.   Review of Systems:  Pt currently on the ventilator, can not provide history or review of systems.    VITAL SIGNS: Temp:  [95.6 F (35.3 C)-98.9 F (37.2 C)] 98.5 F (36.9 C) (11/20 0800) Pulse Rate:  [52-69] 60 (11/20 1000) Resp:  [0-16] 15 (11/20 1000) BP: (93-144)/(56-76) 109/63 mmHg (11/20 1000) SpO2:  [98 %-100 %] 100 % (11/20 1000) FiO2 (%):  [28 %-30 %] 28 % (11/20 0838) HEMODYNAMICS:   VENTILATOR SETTINGS: Vent Mode:  [-] PRVC FiO2 (%):  [28 %-30 %] 28 % Set Rate:  [14 bmp] 14 bmp Vt Set:  [450 mL] 450 mL PEEP:  [5 cmH20] 5 cmH20 Plateau Pressure:  [18 cmH20] 18  cmH20 INTAKE / OUTPUT:  Intake/Output Summary (Last 24 hours) at 02/07/15 1036 Last data filed at 02/07/15 1000  Gross per 24 hour  Intake 1765.76 ml  Output     39 ml  Net 1726.76 ml    PHYSICAL EXAMINATION: Physical Examination:   VS: BP 109/63 mmHg  Pulse 60  Temp(Src) 98.5 F (36.9 C) (Oral)  Resp 15  Ht 5\' 6"  (1.676 m)  Wt 58 kg (127 lb 13.9 oz)  BMI 20.65 kg/m2  SpO2 100%  General Appearance: No distress  Neuro:without focal findings, mental status normal. HEENT: PERRLA, EOM intact. Continued tongue and facial swelling. Though the facial swelling appears to be less than yesterday. Pulmonary: normal breath sounds   CardiovascularNormal S1,S2.  No m/r/g.   Abdomen: Benign, Soft, non-tender. Renal:  No costovertebral tenderness  GU:  Not performed at this time. Endocrine: No evident thyromegaly. Skin:   warm, no rashes, no ecchymosis  Extremities: normal, no cyanosis, clubbing.   LABS:   LABORATORY PANEL:   CBC  Recent Labs Lab 02/07/15 0211  WBC 4.0  HGB 11.7*  HCT 36.2  PLT 119*    Chemistries   Recent Labs Lab 02/06/15 0827 02/07/15 0211  NA 132* 133*  K 5.1 4.7  CL 95* 94*  CO2 21* 25  GLUCOSE 212* 243*  BUN 57* 74*  CREATININE 7.66* 8.23*  CALCIUM 8.4* 7.7*  PHOS 7.1*  6.9*  --      Recent Labs Lab 02/06/15 1225 02/06/15 1556 02/06/15 2028 02/06/15 2352 02/07/15 0346 02/07/15 0758  GLUCAP 245* 237* 204* 194* 228* 177*  Recent Labs Lab 02/04/15 1705  PHART 7.45  PCO2ART 36  PO2ART 132*    Recent Labs Lab 2015-02-24 0827  ALBUMIN 3.1*    Cardiac Enzymes No results for input(s): TROPONINI in the last 168 hours.  RADIOLOGY:  Ct Head Wo Contrast  02-24-2015  CLINICAL DATA:  Coded after vascular procedure for thrombosed AV graft. Acute respiratory failure, end-stage renal disease on dialysis. History of stroke, hypertension, diabetes, dementia. EXAM: CT HEAD WITHOUT CONTRAST TECHNIQUE: Contiguous axial images were  obtained from the base of the skull through the vertex without intravenous contrast. COMPARISON:  CT head December 02, 2014 FINDINGS: No intraparenchymal hemorrhage, mass effect, midline shift or acute large vascular territory infarcts. LEFT parietal encephalomalacia, LEFT occipital lobe encephalomalacia, unchanged. Old bilateral thalamus lacunar infarcts. Patchy to confluent supratentorial white matter hypodensities are unchanged. No abnormal extra-axial fluid collections. Nasogastric tube via LEFT nares . Severe calcific atherosclerosis of the carotid siphons. Imaged paranasal sinuses and mastoid air cells are well aerated. Included ocular globes and orbital contents are normal. IMPRESSION: No acute intracranial process. 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 Electronically Signed   By: Awilda Metro M.D.   On: 2015-02-24 00:18       --Wells Guiles, MD.  Pager [phone] [facility] Office Number: [phone]  Santiago Glad, M.D.  Stephanie Acre, M.D.  Billy Fischer, M.D

## 2015-02-07 NOTE — Progress Notes (Signed)
[facility] - [place] at [facility]   PATIENT NAME: [name]    MR#:  [id number]  DATE OF BIRTH:  11/06/1945  SUBJECTIVE:  CHIEF COMPLAINT:  No chief complaint on file.  Remains intubated and sedated. Slightly less swollen  REVIEW OF SYSTEMS:   ROS unable to obtain due to intubation and sedation.  DRUG ALLERGIES:   Allergies  Allergen Reactions  . Contrast Media [Iodinated Diagnostic Agents] Anaphylaxis  . Oxygen Swelling and Rash    Capnography line oxygen tubing monitor    VITALS:  Blood pressure 120/61, pulse 64, temperature 98.6 F (37 C), temperature source Oral, resp. rate 22, height 5\' 6"  (1.676 m), weight 58 kg (127 lb 13.9 oz), SpO2 100 %.  PHYSICAL EXAMINATION:  GENERAL:  69 y.o.-year-old patient critically ill-appearing, on mechanical ventilation EYES: Pupils are small, minimally reactive, clear conjunctiva, some conjunctival edema HEENT: Head atraumatic, normocephalic. Lips much less swollen, tongue seems less swollen still protruding NECK:  Supple, no jugular venous distention. No thyroid enlargement, no tenderness.  LUNGS: Normal breath sounds bilaterally, no wheezing, rales,rhonchi or crepitation. No use of accessory muscles of respiration. Coarse. Mechanical CARDIOVASCULAR: S1, S2 normal. No murmurs, rubs, or gallops.  ABDOMEN: Soft, nontender, nondistended. Bowel sounds present. No organomegaly or mass.  EXTREMITIES: No pedal edema, cyanosis, or clubbing.  NEUROLOGIC: Sedated.   LABORATORY PANEL:   CBC  Recent Labs Lab 02/07/15 0211  WBC 4.0  HGB 11.7*  HCT 36.2  PLT 119*   ------------------------------------------------------------------------------------------------------------------  Chemistries   Recent Labs Lab 02/07/15 0211  NA 133*  K 4.7  CL 94*  CO2 25  GLUCOSE 243*  BUN 74*  CREATININE 8.23*  CALCIUM 7.7*    ------------------------------------------------------------------------------------------------------------------  Cardiac Enzymes No results for input(s): TROPONINI in the last 168 hours. ------------------------------------------------------------------------------------------------------------------  RADIOLOGY:  Ct Head Wo Contrast  02/06/2015  CLINICAL DATA:  Coded after vascular procedure for thrombosed AV graft. Acute respiratory failure, end-stage renal disease on dialysis. History of stroke, hypertension, diabetes, dementia. EXAM: CT HEAD WITHOUT CONTRAST TECHNIQUE: Contiguous axial images were obtained from the base of the skull through the vertex without intravenous contrast. COMPARISON:  CT head December 02, 2014 FINDINGS: No intraparenchymal hemorrhage, mass effect, midline shift or acute large vascular territory infarcts. LEFT parietal encephalomalacia, LEFT occipital lobe encephalomalacia, unchanged. Old bilateral thalamus lacunar infarcts. Patchy to confluent supratentorial white matter hypodensities are unchanged. No abnormal extra-axial fluid collections. Nasogastric tube via LEFT nares . Severe calcific atherosclerosis of the carotid siphons. Imaged paranasal sinuses and mastoid air cells are well aerated. Included ocular globes and orbital contents are normal. IMPRESSION: No acute intracranial process. 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 Electronically Signed   By: Awilda Metroourtnay  Bloomer M.D.   On: 02/06/2015 00:18    EKG:   Orders placed or performed during the hospital encounter of 12/21/14  . EKG 12-Lead  . EKG 12-Lead  . EKG    ASSESSMENT AND PLAN:   1 angioedema with respiratory compromise: Swelling is slowly decreasing - Continue Decadron and Benadryl - Pulmonology following, continues on full ventilator support - Trigger is  unknown. I did review the photographs of the patient which were taken after her surgery showing welts over her face at the points of contact with the oxygen tubing. She has had a similar event in the past. We'll need to discuss with anesthesia. She will likely need  to see an allergist after discharge.  2 altered mental status: Improving. Now with intentional movement when sedation is decreased. - Obtain neurology appreciated - CT head no acute changes, old L left MCA infarct unchanged - Continue neuro checks and assessments - Continue Keppra 500 mg twice a day as well as 500 mg after dialysis  - At baseline patient lives with her daughter. Is mobile. Is alert and mostly oriented. Does have right sided weakness and some aphasia  3 ESRD on HD - Nephrology following. Continue hemodialysis   4  Hypertension - Blood pressure slightly low at this time. Continue metoprolol, isosorbide, hydralazine. Adjust as needed.  5  Coronary artery disease - Continue aspirin Plavix and statin  All the records are reviewed and case discussed with Care Management/Social Workerr. Management plans discussed with the patient, family and they are in agreement.  CODE STATUS: Full   TOTAL CRITICAL CARE  TIME TAKING CARE OF THIS PATIENT: 25 minutes.  Greater than 50% of time spent in care coordination and counseling.  POSSIBLE D/C IN ? DAYS, DEPENDING ON CLINICAL CONDITION.   Elby Showers M.D on 02/07/2015 at 1:55 PM  Between 7am to 6pm - Pager - [phone]  After 6pm go to [email or web address] - password [email or web address] [facility]  [place] [facility]  Office  [phone]  CC: Primary care physician; Amy Diana Eves, NP

## 2015-02-07 NOTE — Consult Note (Signed)
Reason for Consult: altered mental status Referring Physician: Dr. Volanda Napoleon  [name] is an 69 y.o. female.  HPI: 69 yo RHD F presents to [facility] for AV graft repair when she developed angioedema and was later intubated.  Pt was poorly responsive after this and there was concern for seizure vs. Another neurologic condition.  Pt today started to wake up more and follow per nursing.  No seizure activity noted.  Past Medical History  Diagnosis Date  . Stroke ([place])   . MI (myocardial infarction) ([place])   . Hearing impaired   . ESRD (end stage renal disease) ([place])   . Hyperlipidemia   . Hypertension   . Diabetes mellitus without complication ([place])   . Depression   . Coronary artery disease   . Dementia   . Secondary hyperparathyroidism ([place])   . Dialysis patient [facility])     Monday, Wednesday and Friday.   . Chronic systolic CHF (congestive heart failure) ([place])   . GERD (gastroesophageal reflux disease)   . Personal history of transient ischemic attack (TIA) and cerebral infarction without residual deficit   . Anemia     Past Surgical History  Procedure Laterality Date  . Cataract extraction, bilateral    . Wrist surgery    . Peripheral vascular catheterization Left 07/28/2014    Procedure: A/V Shuntogram/Fistulagram;  Surgeon: Katha Cabal, MD;  Location: [place] [facility];  Service: Cardiovascular;  Laterality: Left;  . Peripheral vascular catheterization Left 07/28/2014    Procedure: A/V Shunt Intervention;  Surgeon: Katha Cabal, MD;  Location: [place] [facility];  Service: Cardiovascular;  Laterality: Left;  . Peripheral vascular catheterization Left 02/04/2015    Procedure: A/V Shuntogram/Fistulagram;  Surgeon: Algernon Huxley, MD;  Location: [place] [facility];  Service: Cardiovascular;  Laterality: Left;  . Peripheral vascular catheterization Left 02/04/2015    Procedure: A/V Shunt Intervention;  Surgeon: Algernon Huxley, MD;  Location: [place] [facility];  Service:  Cardiovascular;  Laterality: Left;    Family History  Problem Relation Age of Onset  . CAD Father     Social History:  reports that she has never smoked. She does not have any smokeless tobacco history on file. She reports that she does not drink alcohol or use illicit drugs.  Allergies:  Allergies  Allergen Reactions  . Contrast Media [Iodinated Diagnostic Agents] Anaphylaxis  . Oxygen Swelling and Rash    Capnography line oxygen tubing monitor    Medications: personally reviewed by me and normal  Results for orders placed or performed during the hospital encounter of 02/04/15 (from the past 48 hour(s))  Glucose, capillary     Status: Abnormal   Collection Time: 02/05/15  4:16 PM  Result Value Ref Range   Glucose-Capillary 135 (H) 65 - 99 mg/dL  Glucose, capillary     Status: Abnormal   Collection Time: 02/05/15  8:00 PM  Result Value Ref Range   Glucose-Capillary 104 (H) 65 - 99 mg/dL  Glucose, capillary     Status: Abnormal   Collection Time: 02/06/15 12:38 AM  Result Value Ref Range   Glucose-Capillary 169 (H) 65 - 99 mg/dL  Glucose, capillary     Status: Abnormal   Collection Time: 02/06/15  4:08 AM  Result Value Ref Range   Glucose-Capillary 186 (H) 65 - 99 mg/dL  Glucose, capillary     Status: Abnormal   Collection Time: 02/06/15  7:57 AM  Result Value Ref Range   Glucose-Capillary 178 (H)  65 - 99 mg/dL  CBC     Status: Abnormal   Collection Time: 02/06/15  8:27 AM  Result Value Ref Range   WBC 5.3 3.6 - 11.0 K/uL   RBC 4.98 3.80 - 5.20 MIL/uL   Hemoglobin 13.7 12.0 - 16.0 g/dL   HCT 43.2 35.0 - 47.0 %   MCV 86.8 80.0 - 100.0 fL   MCH 27.5 26.0 - 34.0 pg   MCHC 31.7 (L) 32.0 - 36.0 g/dL   RDW 22.6 (H) 11.5 - 14.5 %   Platelets 127 (L) 150 - 440 K/uL    Comment: PLATELET COUNT CONFIRMED BY SMEAR  Renal function panel     Status: Abnormal   Collection Time: 02/06/15  8:27 AM  Result Value Ref Range   Sodium 132 (L) 135 - 145 mmol/L   Potassium 5.1 3.5 -  5.1 mmol/L   Chloride 95 (L) 101 - 111 mmol/L   CO2 21 (L) 22 - 32 mmol/L   Glucose, Bld 212 (H) 65 - 99 mg/dL   BUN 57 (H) 6 - 20 mg/dL   Creatinine, Ser 7.66 (H) 0.44 - 1.00 mg/dL   Calcium 8.4 (L) 8.9 - 10.3 mg/dL   Phosphorus 6.9 (H) 2.5 - 4.6 mg/dL   Albumin 3.1 (L) 3.5 - 5.0 g/dL   GFR calc non Af Amer 5 (L) >60 mL/min   GFR calc Af Amer 6 (L) >60 mL/min    Comment: (NOTE) The eGFR has been calculated using the CKD EPI equation. This calculation has not been validated in all clinical situations. eGFR's persistently <60 mL/min signify possible Chronic Kidney Disease.    Anion gap 16 (H) 5 - 15  Phosphorus     Status: Abnormal   Collection Time: 02/06/15  8:27 AM  Result Value Ref Range   Phosphorus 7.1 (H) 2.5 - 4.6 mg/dL  Glucose, capillary     Status: Abnormal   Collection Time: 02/06/15 12:25 PM  Result Value Ref Range   Glucose-Capillary 245 (H) 65 - 99 mg/dL  Glucose, capillary     Status: Abnormal   Collection Time: 02/06/15  3:56 PM  Result Value Ref Range   Glucose-Capillary 237 (H) 65 - 99 mg/dL  Glucose, capillary     Status: Abnormal   Collection Time: 02/06/15  8:28 PM  Result Value Ref Range   Glucose-Capillary 204 (H) 65 - 99 mg/dL   Comment 1 Notify RN   Glucose, capillary     Status: Abnormal   Collection Time: 02/06/15 11:52 PM  Result Value Ref Range   Glucose-Capillary 194 (H) 65 - 99 mg/dL   Comment 1 Notify RN   CBC     Status: Abnormal   Collection Time: 02/07/15  2:11 AM  Result Value Ref Range   WBC 4.0 3.6 - 11.0 K/uL   RBC 4.15 3.80 - 5.20 MIL/uL   Hemoglobin 11.7 (L) 12.0 - 16.0 g/dL   HCT 36.2 35.0 - 47.0 %   MCV 87.2 80.0 - 100.0 fL   MCH 28.2 26.0 - 34.0 pg   MCHC 32.3 32.0 - 36.0 g/dL   RDW 22.4 (H) 11.5 - 14.5 %   Platelets 119 (L) 150 - 440 K/uL  Basic metabolic panel     Status: Abnormal   Collection Time: 02/07/15  2:11 AM  Result Value Ref Range   Sodium 133 (L) 135 - 145 mmol/L   Potassium 4.7 3.5 - 5.1 mmol/L    Chloride 94 (L) 101 - 111  mmol/L   CO2 25 22 - 32 mmol/L   Glucose, Bld 243 (H) 65 - 99 mg/dL   BUN 74 (H) 6 - 20 mg/dL   Creatinine, Ser 8.23 (H) 0.44 - 1.00 mg/dL   Calcium 7.7 (L) 8.9 - 10.3 mg/dL   GFR calc non Af Amer 4 (L) >60 mL/min   GFR calc Af Amer 5 (L) >60 mL/min    Comment: (NOTE) The eGFR has been calculated using the CKD EPI equation. This calculation has not been validated in all clinical situations. eGFR's persistently <60 mL/min signify possible Chronic Kidney Disease.    Anion gap 14 5 - 15  Triglycerides     Status: None   Collection Time: 02/07/15  2:11 AM  Result Value Ref Range   Triglycerides 32 <150 mg/dL  Glucose, capillary     Status: Abnormal   Collection Time: 02/07/15  3:46 AM  Result Value Ref Range   Glucose-Capillary 228 (H) 65 - 99 mg/dL   Comment 1 Notify RN   Glucose, capillary     Status: Abnormal   Collection Time: 02/07/15  7:58 AM  Result Value Ref Range   Glucose-Capillary 177 (H) 65 - 99 mg/dL  Glucose, capillary     Status: Abnormal   Collection Time: 02/07/15 11:57 AM  Result Value Ref Range   Glucose-Capillary 211 (H) 65 - 99 mg/dL    Ct Head Wo Contrast  02/10/15  CLINICAL DATA:  Coded after vascular procedure for thrombosed AV graft. Acute respiratory failure, end-stage renal disease on dialysis. History of stroke, hypertension, diabetes, dementia. EXAM: CT HEAD WITHOUT CONTRAST TECHNIQUE: Contiguous axial images were obtained from the base of the skull through the vertex without intravenous contrast. COMPARISON:  CT head December 02, 2014 FINDINGS: No intraparenchymal hemorrhage, mass effect, midline shift or acute large vascular territory infarcts. LEFT parietal encephalomalacia, LEFT occipital lobe encephalomalacia, unchanged. Old bilateral thalamus lacunar infarcts. Patchy to confluent supratentorial white matter hypodensities are unchanged. No abnormal extra-axial fluid collections. Nasogastric tube via LEFT nares . Severe  calcific atherosclerosis of the carotid siphons. Imaged paranasal sinuses and mastoid air cells are well aerated. Included ocular globes and orbital contents are normal. IMPRESSION: No acute intracranial process. 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 Electronically Signed   By: Elon Alas M.D.   On: February 10, 2015 00:18    Review of Systems  Unable to perform ROS: intubated   Blood pressure 146/74, pulse 77, temperature 98.6 F (37 C), temperature source Oral, resp. rate 23, height 5' 6"  (1.676 m), weight 58 kg (127 lb 13.9 oz), SpO2 100 %. Physical Exam  Constitutional: She appears well-developed and well-nourished. No distress.  HENT:  Head: Normocephalic and atraumatic.  Right Ear: External ear normal.  Left Ear: External ear normal.  Nose: Nose normal.  Eyes: Conjunctivae and EOM are normal. Pupils are equal, round, and reactive to light.  Neck: Normal range of motion. Neck supple.  Cardiovascular: Normal rate, regular rhythm, normal heart sounds and intact distal pulses.   Respiratory: Effort normal and breath sounds normal.  GI: Soft. Bowel sounds are normal.  Neurological:  Intubated, sedated, opens and tracks but rarely follows PERRLA, EOMI, + corneals, good cough R hemiparesis but able to move some, L localizes untestable cerebellar 1+/4 B, R babinski, l plantar down Grimaces to pain in all 4 ext  Skin: Skin is warm. She is not  diaphoretic.   CT of head personally reviewed by me and shows moderate L MCA infarct that is old  Assessment/Plan: 1.  Probable seizure-  Pt has infarct which puts her at risk for seizure activity and now is starting to resolve 2.  Old L MCA infarct-   Stable but pt will likely have baseline aphasia so I would not expect her to follow all commands -  EEG  -  Add 532m Keppra after dialysis as well as 5077m BID -  Continue ASA, plavix and statin -  Would extubate as tolerated -  Will follow briefly  Jb Dulworth 02/07/2015, 12:38 PM

## 2015-02-07 NOTE — Progress Notes (Signed)
Pharmacy Note  Existing order for Keppra 500 IV q12h. New order for Keppra 500 mg IV MWF. Spoke with Dr. Katrinka BlazingSmith, new order is to be given after dialysis in addition to 500 mg IV q12h order. MD confirmed that he would like 1000 mg to be given after HD (i.e. MWF).

## 2015-02-08 ENCOUNTER — Inpatient Hospital Stay: Admission: RE | Admit: 2015-02-08 | Payer: Medicare Other | Source: Ambulatory Visit | Admitting: Vascular Surgery

## 2015-02-08 ENCOUNTER — Inpatient Hospital Stay: Payer: Medicare Other

## 2015-02-08 ENCOUNTER — Inpatient Hospital Stay
Admission: RE | Admit: 2015-02-08 | Discharge: 2015-02-08 | Disposition: A | Discharge status: Home / Self Care | Payer: Medicare Other | Source: Ambulatory Visit | Attending: Pulmonary Disease | Admitting: Pulmonary Disease

## 2015-02-08 DIAGNOSIS — R400 Somnolence: Secondary | ICD-10-CM

## 2015-02-08 LAB — CBC
HEMATOCRIT: 34.2 % — AB (ref 35.0–47.0)
Hemoglobin: 11.4 g/dL — ABNORMAL LOW (ref 12.0–16.0)
MCH: 28.9 pg (ref 26.0–34.0)
MCHC: 33.3 g/dL (ref 32.0–36.0)
MCV: 86.9 fL (ref 80.0–100.0)
Platelets: 125 10*3/uL — ABNORMAL LOW (ref 150–440)
RBC: 3.94 MIL/uL (ref 3.80–5.20)
RDW: 22.2 % — ABNORMAL HIGH (ref 11.5–14.5)
WBC: 4.4 10*3/uL (ref 3.6–11.0)

## 2015-02-08 LAB — BASIC METABOLIC PANEL
ANION GAP: 17 — AB (ref 5–15)
BUN: 97 mg/dL — AB (ref 6–20)
CO2: 23 mmol/L (ref 22–32)
Calcium: 7.4 mg/dL — ABNORMAL LOW (ref 8.9–10.3)
Chloride: 90 mmol/L — ABNORMAL LOW (ref 101–111)
Creatinine, Ser: 9.43 mg/dL — ABNORMAL HIGH (ref 0.44–1.00)
GFR calc Af Amer: 4 mL/min — ABNORMAL LOW (ref >60)
GFR calc non Af Amer: 4 mL/min — ABNORMAL LOW (ref >60)
GLUCOSE: 249 mg/dL — AB (ref 65–99)
POTASSIUM: 5 mmol/L (ref 3.5–5.1)
Sodium: 130 mmol/L — ABNORMAL LOW (ref 135–145)

## 2015-02-08 LAB — GLUCOSE, CAPILLARY
GLUCOSE-CAPILLARY: 155 mg/dL — AB (ref 65–99)
Glucose-Capillary: 229 mg/dL — ABNORMAL HIGH (ref 65–99)
Glucose-Capillary: 267 mg/dL — ABNORMAL HIGH (ref 65–99)
Glucose-Capillary: 190 mg/dL — ABNORMAL HIGH (ref 65–99)
Glucose-Capillary: 129 mg/dL — ABNORMAL HIGH (ref 65–99)

## 2015-02-08 MED ORDER — SENNOSIDES-DOCUSATE SODIUM 8.6-50 MG PO TABS
1.0000 | ORAL_TABLET | Freq: Two times a day (BID) | ORAL | Status: DC
  Administered 2015-02-08 (×2): 1 via ORAL
  Filled 2015-02-08 (×2): qty 1

## 2015-02-08 NOTE — Progress Notes (Signed)
[facility] [place] [facility]   PATIENT NAME: [name]    MR#:  [id number]  DATE OF BIRTH:  01/20/1946  SUBJECTIVE:  CHIEF COMPLAINT:  No chief complaint on file.  Remains intubated and sedated. Slightly less swollen tongue. Responds to simple commands  REVIEW OF SYSTEMS:   ROS unable to obtain due to intubation and sedation.  DRUG ALLERGIES:   Allergies  Allergen Reactions  . Contrast Media [Iodinated Diagnostic Agents] Anaphylaxis  . Oxygen Swelling and Rash    Capnography line oxygen tubing monitor    VITALS:  Blood pressure 139/67, pulse 71, temperature 98.6 F (37 C), temperature source Oral, resp. rate 21, height 5\' 6"  (1.676 m), weight 66.1 kg (145 lb 11.6 oz), SpO2 100 %.  PHYSICAL EXAMINATION:  GENERAL:  69 y.o.-year-old patient critically ill-appearing, on mechanical ventilation EYES: Pupils are small, minimally reactive, clear conjunctiva, some conjunctival edema HEENT: Head atraumatic, normocephalic. Lips much less swollen, tongue seems less swollen still protruding NECK:  Supple, no jugular venous distention. No thyroid enlargement, no tenderness.  LUNGS: Normal breath sounds bilaterally, no wheezing, rales,rhonchi or crepitation. No use of accessory muscles of respiration. Coarse. Mechanical CARDIOVASCULAR: S1, S2 normal. No murmurs, rubs, or gallops.  ABDOMEN: Soft, nontender, nondistended. Bowel sounds present. No organomegaly or mass.  EXTREMITIES: No pedal edema, cyanosis, or clubbing.  NEUROLOGIC: Sedated. Opens eyes to voice. Follows simple commands.  LABORATORY PANEL:   CBC  Recent Labs Lab 02/08/15 0246  WBC 4.4  HGB 11.4*  HCT 34.2*  PLT 125*   ------------------------------------------------------------------------------------------------------------------  Chemistries   Recent Labs Lab 02/08/15 0246  NA 130*  K 5.0  CL 90*  CO2 23  GLUCOSE 249*  BUN 97*  CREATININE 9.43*  CALCIUM 7.4*    ------------------------------------------------------------------------------------------------------------------  Cardiac Enzymes No results for input(s): TROPONINI in the last 168 hours. ------------------------------------------------------------------------------------------------------------------  RADIOLOGY:  Dg Chest 1 View  02/08/2015  CLINICAL DATA:  Shortness of breath. EXAM: CHEST 1 VIEW COMPARISON:  02/04/2015 . FINDINGS: Endotracheal tube and NG tube in stable position. Cardiomegaly with normal pulmonary vascularity. Low lung volumes with mild bibasilar atelectasis. Tiny right pleural effusion cannot be excluded. No pneumothorax. IMPRESSION: 1. Lines and tubes in stable position. 2. Low lung volumes with mild bibasilar subsegmental atelectasis. Tiny right pleural effusion cannot be excluded. Electronically Signed   By: Maisie Fushomas  Register   On: 02/08/2015 07:13    EKG:   Orders placed or performed during the hospital encounter of 12/21/14  . EKG 12-Lead  . EKG 12-Lead  . EKG    ASSESSMENT AND PLAN:   1 angioedema with respiratory compromise: Swelling is slowly decreasing - Continue Decadron and Benadryl - Pulmonology following, continues on full ventilator support - Trigger is unknown. I did review the photographs of the patient which were taken after her surgery showing welts over her face at the points of contact with the oxygen tubing. She has had a similar event in the past. We'll need to discuss with anesthesia. She will likely need to see an allergist after discharge. - Possible extubation in the morning by Dr. Darrol AngelSimons  2 altered mental status: Improving. Now with intentional movement when sedation is decreased. Not requiring much sedation at this time. - Obtain neurology appreciated, feels that AMS likely postictal - CT head no acute changes, old L left MCA infarct unchanged - Continue neuro checks and assessments - Continue Keppra 500 mg twice a day as well as  500 mg after dialysis  -  At baseline patient lives with her daughter. Is mobile. Is alert and mostly oriented. Does have right sided weakness and some aphasia  3 ESRD on HD - Nephrology following. Continue hemodialysis   4  Hypertension - Blood pressure slightly low at this time. Continue metoprolol, isosorbide, hydralazine. Adjust as needed.  5  Coronary artery disease - Continue aspirin Plavix and statin  All the records are reviewed and case discussed with Care Management/Social Workerr. Management plans discussed with the patient, family and they are in agreement. Spoke with her daughter by phone this evening  CODE STATUS: Full   TOTAL CRITICAL CARE  TIME TAKING CARE OF THIS PATIENT: 25 minutes.  Greater than 50% of time spent in care coordination and counseling.  POSSIBLE D/C IN ? DAYS, DEPENDING ON CLINICAL CONDITION.   Elby Showers M.D on 02/08/2015 at 5:29 PM  Between 7am to 6pm - Pager - [phone]  After 6pm go to [email or web address] - password [email or web address] [facility]  [facility]  Office  [phone]  CC: Primary care physician; Amy Diana Eves, NP

## 2015-02-08 NOTE — Progress Notes (Addendum)
I have reviewed pt's hospitalization in detail  Intubated. RASS -2. + F/C intermittently. Tolerated PS 5 cm H2O most of day. Underwent HD today  Filed Vitals:   02/08/15 1415 02/08/15 1500 02/08/15 1600 02/08/15 1700  BP: 162/84 130/63 146/71 139/67  Pulse: 81 64 82 71  Temp: 98.6 F (37 C)     TempSrc: Oral     Resp: 19 6 18 21   Height:      Weight:      SpO2:  100% 97% 100%   Chronically ill appearing, intubated, no distress RASS -2, + F/C Macroglossia, tongue and subglottic area soft No JVD seen Chest clear Reg, harsh systolic M NABS, soft, NT Ext warm, no edema Mild R sided weakness  CMP Latest Ref Rng 02/08/2015 02/07/2015 02/06/2015  Glucose 65 - 99 mg/dL 782(N249(H) 562(Z243(H) 308(M212(H)  BUN 6 - 20 mg/dL 57(Q97(H) 46(N74(H) 62(X57(H)  Creatinine 0.44 - 1.00 mg/dL 5.28(U9.43(H) 1.32(G8.23(H) 4.01(U7.66(H)  Sodium 135 - 145 mmol/L 130(L) 133(L) 132(L)  Potassium 3.5 - 5.1 mmol/L 5.0 4.7 5.1  Chloride 101 - 111 mmol/L 90(L) 94(L) 95(L)  CO2 22 - 32 mmol/L 23 25 21(L)  Calcium 8.9 - 10.3 mg/dL 7.4(L) 7.7(L) 8.4(L)  Total Protein 6.5 - 8.1 g/dL - - -  Total Bilirubin 0.3 - 1.2 mg/dL - - -  Alkaline Phos 38 - 126 U/L - - -  AST 15 - 41 U/L - - -  ALT 14 - 54 U/L - - -    CBC Latest Ref Rng 02/08/2015 02/07/2015 02/06/2015  WBC 3.6 - 11.0 K/uL 4.4 4.0 5.3  Hemoglobin 12.0 - 16.0 g/dL 11.4(L) 11.7(L) 13.7  Hematocrit 35.0 - 47.0 % 34.2(L) 36.2 43.2  Platelets 150 - 440 K/uL 125(L) 119(L) 127(L)    CXR: CM, NAD  IMPRESSION: VDRF - intubated for UAO, suspected angioedema - now clinically much improved to resolved Newly documented heart murmur ESRD Prior CVA with R hemiplegia Concern for new onset seizure - Keppra initiated 11/20. EEG performed 11/21 DM2 - hyperglycemia exacerbated by systemic steroids   PLAN/REC: Cont vent support - settings reviewed and/or adjusted Wean in PSV mode as tolerated Cont vent bundle Daily SBT if/when meets criteria  Anticipate extubation 11/22 DC systemic steroids  and scheduled antihistamines Echocardiogram ordered 11/21 Monitor BMET intermittently Monitor I/Os Correct electrolytes as indicated HD per renal service DVT px: SQ heparin Monitor CBC intermittently Transfuse per usual ICU guidelines F/U EEG Further eval and mgmt of possible seizures per Neurology  Son-in-law (and HCPOA) updated @ bedside  CCM time:  40 mins The above time includes time spent in consultation with patient and/or family members and reviewing care plan on multidisciplinary rounds  Billy Fischeravid Jonovan Boedecker, MD PCCM service Mobile [phone] Pager [phone]

## 2015-02-08 NOTE — Progress Notes (Signed)
Patient saline locked.  Sinus rhythm with occasional PVC.  Lungs sound clear and supported on vent.  Vent settings: spontaneous mode with RR 20-22, Peep 5, pressure support 5, fi02 28%.  Patient off sedation and in spontaneous mode since 9am.  Patient will follow simple commands with known right sided weakness from old CVA. Patient is still lethargic and drifts off to sleep when attempting to follow simple commands. Tongue and bottom lip continues to be swollen. Patient to be reassessed tomorrow for extubation.  Abdomen with positive bowel sounds. No BM this shift, senna started.  Vital high protein being tolerated with minimal to no residual.  Foley removed as patient makes minimal urine and is hemodialysis patient.  Hemodialysis completed today with 1.5kg removed.  Daughter updated by nurse, Dr. Sung AmabileSimonds, and Dr. Clent RidgesWalsh via phone. Nurse will continue to monitor.

## 2015-02-08 NOTE — Progress Notes (Signed)
Nutrition Follow-up      INTERVENTION:  EN: TF currently on hold secondary to weaning.  If unable to be weaned recommend restarting tube feeding at 4140ml/hr.  If unable to wean and continues off diprivan will reassess goal rate in am   NUTRITION DIAGNOSIS:   Inadequate oral intake related to acute illness as evidenced by NPO status.    GOAL:   Provide needs based on ASPEN/SCCM guidelines    MONITOR:    (Energy Intake, Anthropometrics, Electrolyte/Renal Profile, Glucose Profile, Pulmonary)  REASON FOR ASSESSMENT:   Ventilator, Consult Enteral/tube feeding initiation and management  ASSESSMENT:      Planning to wean this am, TF currently on hold.  Planning HD today, EEG done today   Current Nutrition: Previously tolerating vital high protein at 9140ml/hr   Gastrointestinal Profile: Last BM: no BM noted   Scheduled Medications:  . antiseptic oral rinse  7 mL Mouth Rinse QID  . aspirin  81 mg Per Tube Daily  . atorvastatin  40 mg Oral q1800  . calcium acetate (Phos Binder)  1,334 mg Per Tube TID  . chlorhexidine gluconate  15 mL Mouth Rinse BID  . clopidogrel  75 mg Oral Daily  . feeding supplement (VITAL HIGH PROTEIN)  1,000 mL Per Tube Q24H  . free water  100 mL Per Tube 3 times per day  . heparin subcutaneous  5,000 Units Subcutaneous Q12H  . hydrALAZINE  50 mg Oral 3 times per day  . insulin aspart  0-15 Units Subcutaneous 6 times per day  . isosorbide mononitrate  30 mg Oral Daily  . levETIRAcetam  500 mg Intravenous Q24H   And  . levETIRAcetam  500 mg Intravenous Once per day on Sun Tue Thu Sat   And  . levETIRAcetam  1,000 mg Intravenous Once per day on Mon Wed Fri  . metoprolol tartrate  12.5 mg Oral BID  . pantoprazole sodium  40 mg Per Tube Daily  . vitamin C  500 mg Oral BID       Electrolyte/Renal Profile and Glucose Profile:   Recent Labs Lab 02/06/15 0827 02/07/15 0211 02/08/15 0246  NA 132* 133* 130*  K 5.1 4.7 5.0  CL 95* 94* 90*   CO2 21* 25 23  BUN 57* 74* 97*  CREATININE 7.66* 8.23* 9.43*  CALCIUM 8.4* 7.7* 7.4*  PHOS 7.1*  6.9*  --   --   GLUCOSE 212* 243* 249*   Protein Profile:  Recent Labs Lab 02/06/15 0827  ALBUMIN 3.1*      Weight Trend since Admission: Filed Weights   02/06/15 0454 02/08/15 0427 02/08/15 1047  Weight: 127 lb 13.9 oz (58 kg) 132 lb 11.5 oz (60.2 kg) 145 lb 11.6 oz (66.1 kg)      Diet Order:   NPO  Skin:  Reviewed, no issues   Height:   Ht Readings from Last 1 Encounters:  02/04/15 5\' 6"  (1.676 m)    Weight:   Wt Readings from Last 1 Encounters:  02/08/15 145 lb 11.6 oz (66.1 kg)    Ideal Body Weight:     BMI:  Body mass index is 23.53 kg/(m^2).  Estimated Nutritional Needs:   Kcal:  1211 kcals (BEE 1128, Ve: 6.3, Tmax: 36.8) using wt of 58.1 kg  Protein:  70-116 g (1.2-2.0 g/kg)   Fluid:  1000 mL plus UOP  EDUCATION NEEDS:      HIGH Care Level  Montey Ebel B. Freida BusmanAllen, RD, LDN [phone] (pager)

## 2015-02-08 NOTE — Progress Notes (Signed)
[facility]  ROUNDING NOTE   Subjective:  Still has some tongue protrusion and swelling. Remains on the ventilator. Plans for HD today Propofol stopped this AM Not fully awake yet. Not following commands   Objective:  Vital signs in last 24 hours:  Temp:  [97.3 F (36.3 C)-98.6 F (37 C)] 98.2 F (36.8 C) (11/21 0400) Pulse Rate:  [59-121] 65 (11/21 0900) Resp:  [0-24] 21 (11/21 0900) BP: (109-194)/(60-108) 131/70 mmHg (11/21 0900) SpO2:  [97 %-100 %] 100 % (11/21 0900) FiO2 (%):  [28 %] 28 % (11/21 0900) Weight:  [60.2 kg (132 lb 11.5 oz)] 60.2 kg (132 lb 11.5 oz) (11/21 0427)  Weight change:  Filed Weights   02/04/15 1310 02/06/15 0454 02/08/15 0427  Weight: 58.06 kg (128 lb) 58 kg (127 lb 13.9 oz) 60.2 kg (132 lb 11.5 oz)    Intake/Output: I/O last 3 completed shifts: In: 1910.3 [I.V.:215.3; NG/GT:1590; IV Piggyback:105] Out: 86 [Urine:86]   Intake/Output this shift:  Total I/O In: 80 [NG/GT:80] Out: -   Physical Exam: General: Critically ill appearing  Head: Normocephalic, atraumatic. Moist oral mucosal membranes, tongue swelling and protrusion noted.  Eyes:/ENT Anicteric, ETT in place  Neck: Supple, trachea midline  Lungs:  Clear to auscultation , vent assisted  Heart: S1S2 3/6 crescendo SEM  Abdomen:  Soft, nontender, BS present  Extremities:  trace peripheral edema.  Neurologic: Intubated/sedated  Skin: No lesions  Access: LUE AVG    Basic Metabolic Panel:  Recent Labs Lab 02/04/15 1731  02/05/15 0611 02/06/15 0827 02/07/15 0211 02/08/15 0246  NA  --   --  134* 132* 133* 130*  K  --   --  4.2 5.1 4.7 5.0  CL  --   --  94* 95* 94* 90*  CO2  --   --  24 21* 25 23  GLUCOSE  --   --  152* 212* 243* 249*  BUN  --   --  65* 57* 74* 97*  CREATININE 7.97*  --  8.61* 7.66* 8.23* 9.43*  CALCIUM  --   < > 8.7* 8.4* 7.7* 7.4*  PHOS  --   --   --  7.1*  6.9*  --   --   < > = values in this interval not displayed.  Liver Function  Tests:  Recent Labs Lab 02/06/15 0827  ALBUMIN 3.1*   No results for input(s): LIPASE, AMYLASE in the last 168 hours. No results for input(s): AMMONIA in the last 168 hours.  CBC:  Recent Labs Lab 02/04/15 1731 02/05/15 0611 02/06/15 0827 02/07/15 0211 02/08/15 0246  WBC 4.1 2.2* 5.3 4.0 4.4  HGB 11.8* 12.5 13.7 11.7* 11.4*  HCT 36.0 39.1 43.2 36.2 34.2*  MCV 87.8 87.7 86.8 87.2 86.9  PLT 119* 135* 127* 119* 125*    Cardiac Enzymes: No results for input(s): CKTOTAL, CKMB, CKMBINDEX, TROPONINI in the last 168 hours.  BNP: Invalid input(s): POCBNP  CBG:  Recent Labs Lab 02/07/15 1605 02/07/15 2003 02/07/15 2347 02/08/15 0404 02/08/15 0732  GLUCAP 187* 189* 202* 229* 267*    Microbiology: Results for orders placed or performed during the hospital encounter of 02/04/15  MRSA PCR Screening     Status: None   Collection Time: 02/04/15  2:05 PM  Result Value Ref Range Status   MRSA by PCR NEGATIVE NEGATIVE Final    Comment:        The GeneXpert MRSA Assay (FDA approved for NASAL specimens only), is one component  of a comprehensive MRSA colonization surveillance program. It is not intended to diagnose MRSA infection nor to guide or monitor treatment for MRSA infections.     Coagulation Studies: No results for input(s): LABPROT, INR in the last 72 hours.  Urinalysis: No results for input(s): COLORURINE, LABSPEC, PHURINE, GLUCOSEU, HGBUR, BILIRUBINUR, KETONESUR, PROTEINUR, UROBILINOGEN, NITRITE, LEUKOCYTESUR in the last 72 hours.  Invalid input(s): APPERANCEUR    Imaging: Dg Chest 1 View  02/08/2015  CLINICAL DATA:  Shortness of breath. EXAM: CHEST 1 VIEW COMPARISON:  02/04/2015 . FINDINGS: Endotracheal tube and NG tube in stable position. Cardiomegaly with normal pulmonary vascularity. Low lung volumes with mild bibasilar atelectasis. Tiny right pleural effusion cannot be excluded. No pneumothorax. IMPRESSION: 1. Lines and tubes in stable position. 2.  Low lung volumes with mild bibasilar subsegmental atelectasis. Tiny right pleural effusion cannot be excluded. Electronically Signed   By: Maisie Fushomas  Register   On: 02/08/2015 07:13     Medications:   . propofol (DIPRIVAN) infusion Stopped (02/08/15 0600)   . antiseptic oral rinse  7 mL Mouth Rinse QID  . aspirin  81 mg Per Tube Daily  . atorvastatin  40 mg Oral q1800  . calcium acetate (Phos Binder)  1,334 mg Per Tube TID  . chlorhexidine gluconate  15 mL Mouth Rinse BID  . clopidogrel  75 mg Oral Daily  . dexamethasone  10 mg Intravenous 4 times per day  . feeding supplement (VITAL HIGH PROTEIN)  1,000 mL Per Tube Q24H  . free water  100 mL Per Tube 3 times per day  . heparin subcutaneous  5,000 Units Subcutaneous Q12H  . hydrALAZINE  50 mg Oral 3 times per day  . insulin aspart  0-15 Units Subcutaneous 6 times per day  . isosorbide mononitrate  30 mg Oral Daily  . levETIRAcetam  500 mg Intravenous Q24H   And  . levETIRAcetam  500 mg Intravenous Once per day on Sun Tue Thu Sat   And  . levETIRAcetam  1,000 mg Intravenous Once per day on Mon Wed Fri  . metoprolol tartrate  12.5 mg Oral BID  . pantoprazole sodium  40 mg Per Tube Daily  . vitamin C  500 mg Oral BID   acetaminophen **OR** acetaminophen, alteplase, alum & mag hydroxide-simeth, guaiFENesin-dextromethorphan, heparin, lidocaine (PF), lidocaine-prilocaine, morphine injection, ondansetron, oxyCODONE-acetaminophen, phenol  Assessment/ Plan:  69 y.o. female past medical history of end-stage renal disease, myocardial infarction, CVA, hyperlipidemia, hypertension, diabetes mellitus, depression, secondary hyperparathyroidism, dementia, GERD, anemia chronic kidney disease who presented for a fistulogram and ended up having acute respiratory failure and acute tongue swelling/angioedema.  1.  End-stage renal disease on hemodialysis.  HD today  2.  Acute respiratory failure/angioedema.  Cause of angioedema unclear -tongue swelling  and protrusion persists,  mechanical ventilation per pulm/cc. - awaiting extubation once Mental status improves  3.  Anemia chronic kidney disease.  Continue to hold epogen, reassess for need by monitoring CBC.    4.  Secondary hyperparathyroidism.  Phos high at 7.1, start phoslyra solution.    LOS: 4 Epsie Walthall 11/21/20169:31 AM

## 2015-02-08 NOTE — Progress Notes (Signed)
Face and tongue swelling better than Friday, but still present On HD now AVG working well. No new recs from vascular POV at this point Pulmonary working on extubation, hopefully today or tomorrow

## 2015-02-08 NOTE — Progress Notes (Signed)
NEUROLOGY NOTE  S: on dialysis but sedation held  ROS unobtainable to intubation  O: 98.3    155/85    88    15 No distress, nl weight Normocephalic, oropharynx clear Supple, no JVD CTA B, no wheezing RRR, no murmur No C/C/E  Intubated, sedated, does open and follow PERRLA, + corneals B, good cough R hemiplegia  A/P: 1. Probable seizure-  Resolved, EEG ok now except mild slowing 2.  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

## 2015-02-08 NOTE — Progress Notes (Signed)
*  PRELIMINARY RESULTS* Echocardiogram 2D Echocardiogram has been performed.  [name] 02/08/2015, 4:39 PM

## 2015-02-09 ENCOUNTER — Inpatient Hospital Stay: Payer: Medicare Other

## 2015-02-09 DIAGNOSIS — N186 End stage renal disease: Secondary | ICD-10-CM

## 2015-02-09 DIAGNOSIS — I350 Nonrheumatic aortic (valve) stenosis: Secondary | ICD-10-CM

## 2015-02-09 LAB — COMPREHENSIVE METABOLIC PANEL
ALBUMIN: 2.9 g/dL — AB (ref 3.5–5.0)
ALT: 19 U/L (ref 14–54)
ANION GAP: 13 (ref 5–15)
AST: 23 U/L (ref 15–41)
Alkaline Phosphatase: 56 U/L (ref 38–126)
BUN: 65 mg/dL — ABNORMAL HIGH (ref 6–20)
CHLORIDE: 96 mmol/L — AB (ref 101–111)
CO2: 26 mmol/L (ref 22–32)
Calcium: 8.1 mg/dL — ABNORMAL LOW (ref 8.9–10.3)
Creatinine, Ser: 7.28 mg/dL — ABNORMAL HIGH (ref 0.44–1.00)
GFR calc Af Amer: 6 mL/min — ABNORMAL LOW (ref >60)
GFR calc non Af Amer: 5 mL/min — ABNORMAL LOW (ref >60)
GLUCOSE: 193 mg/dL — AB (ref 65–99)
POTASSIUM: 4.3 mmol/L (ref 3.5–5.1)
SODIUM: 135 mmol/L (ref 135–145)
Total Bilirubin: 0.8 mg/dL (ref 0.3–1.2)
Total Protein: 6.1 g/dL — ABNORMAL LOW (ref 6.5–8.1)

## 2015-02-09 LAB — CBC
HCT: 37.4 % (ref 35.0–47.0)
HEMOGLOBIN: 12.1 g/dL (ref 12.0–16.0)
MCH: 28.4 pg (ref 26.0–34.0)
MCHC: 32.3 g/dL (ref 32.0–36.0)
MCV: 87.9 fL (ref 80.0–100.0)
Platelets: 143 10*3/uL — ABNORMAL LOW (ref 150–440)
RBC: 4.25 MIL/uL (ref 3.80–5.20)
RDW: 22 % — AB (ref 11.5–14.5)
WBC: 6.7 10*3/uL (ref 3.6–11.0)

## 2015-02-09 LAB — HEPATITIS B SURFACE ANTIBODY, QUANTITATIVE: HEPATITIS B-POST: 147.2 m[IU]/mL

## 2015-02-09 LAB — GLUCOSE, CAPILLARY
GLUCOSE-CAPILLARY: 140 mg/dL — AB (ref 65–99)
GLUCOSE-CAPILLARY: 196 mg/dL — AB (ref 65–99)
GLUCOSE-CAPILLARY: 145 mg/dL — AB (ref 65–99)
Glucose-Capillary: 154 mg/dL — ABNORMAL HIGH (ref 65–99)
Glucose-Capillary: 172 mg/dL — ABNORMAL HIGH (ref 65–99)
Glucose-Capillary: 207 mg/dL — ABNORMAL HIGH (ref 65–99)

## 2015-02-09 LAB — HEPATITIS B SURFACE ANTIGEN: HEP B S AG: NEGATIVE

## 2015-02-09 MED ORDER — HYDRALAZINE HCL 50 MG PO TABS
50.0000 mg | ORAL_TABLET | Freq: Three times a day (TID) | ORAL | Status: DC
  Administered 2015-02-09 – 2015-02-17 (×22): 50 mg
  Filled 2015-02-09 (×22): qty 1

## 2015-02-09 MED ORDER — METOPROLOL TARTRATE 25 MG PO TABS
12.5000 mg | ORAL_TABLET | Freq: Two times a day (BID) | ORAL | Status: DC
  Administered 2015-02-09 – 2015-02-16 (×15): 12.5 mg
  Filled 2015-02-09 (×15): qty 1

## 2015-02-09 MED ORDER — NEPRO/CARBSTEADY PO LIQD
1000.0000 mL | ORAL | Status: DC
  Administered 2015-02-09 – 2015-02-11 (×3): 1000 mL via ORAL

## 2015-02-09 MED ORDER — ISOSORBIDE DINITRATE 10 MG PO TABS
10.0000 mg | ORAL_TABLET | Freq: Two times a day (BID) | ORAL | Status: DC
  Administered 2015-02-09: 10 mg
  Administered 2015-02-09: 20 mg
  Administered 2015-02-10 – 2015-02-23 (×23): 10 mg
  Filled 2015-02-09 (×26): qty 1
  Filled 2015-02-09: qty 2

## 2015-02-09 MED ORDER — VITAMIN C 500 MG PO TABS
500.0000 mg | ORAL_TABLET | Freq: Two times a day (BID) | ORAL | Status: DC
  Administered 2015-02-09 – 2015-02-16 (×15): 500 mg
  Filled 2015-02-09 (×16): qty 1

## 2015-02-09 MED ORDER — SENNOSIDES-DOCUSATE SODIUM 8.6-50 MG PO TABS
1.0000 | ORAL_TABLET | Freq: Two times a day (BID) | ORAL | Status: DC
  Administered 2015-02-09 – 2015-02-16 (×15): 1
  Filled 2015-02-09 (×16): qty 1

## 2015-02-09 MED ORDER — CLOPIDOGREL BISULFATE 75 MG PO TABS
75.0000 mg | ORAL_TABLET | Freq: Every day | ORAL | Status: DC
  Administered 2015-02-09 – 2015-02-23 (×12): 75 mg
  Filled 2015-02-09 (×12): qty 1

## 2015-02-09 MED ORDER — FENTANYL CITRATE (PF) 100 MCG/2ML IJ SOLN
50.0000 ug | INTRAMUSCULAR | Status: DC | PRN
  Administered 2015-02-09: 50 ug via INTRAVENOUS
  Filled 2015-02-09: qty 2

## 2015-02-09 NOTE — Progress Notes (Signed)
Nutrition Follow-up     INTERVENTION:  EN: NG tube remains in place following extubation awaiting SLP evaluation.  Recommend switching from vital to nepro secondary to pt off vent.  Recommend nepro at 6140ml/hr, to provide 1728 kcals and 78 gm of protein, 700ml free water.  Continue free water flush of 100ml q 8 hr per MD order.     NUTRITION DIAGNOSIS:   Inadequate oral intake related to acute illness as evidenced by NPO status.    GOAL:   Patient will meet greater than or equal to 90% of their needs    MONITOR:    (Energy Intake, Anthropometrics, Electrolyte/Renal Profile, Glucose Profile, Pulmonary)  REASON FOR ASSESSMENT:   Ventilator, Consult Enteral/tube feeding initiation and management  ASSESSMENT:      Pt extubated this am, requiring frequent suctioning, 2 Liters nasal canula at this time.     Current Nutrition: NG tube left in place following extubation and tube feeding has been resumed.     Gastrointestinal Profile: Last BM: 11/22   Scheduled Medications:  . antiseptic oral rinse  7 mL Mouth Rinse QID  . aspirin  81 mg Per Tube Daily  . atorvastatin  40 mg Oral q1800  . calcium acetate (Phos Binder)  1,334 mg Per Tube TID  . chlorhexidine gluconate  15 mL Mouth Rinse BID  . clopidogrel  75 mg Per Tube Daily  . feeding supplement (VITAL HIGH PROTEIN)  1,000 mL Per Tube Q24H  . free water  100 mL Per Tube 3 times per day  . heparin subcutaneous  5,000 Units Subcutaneous Q12H  . hydrALAZINE  50 mg Per Tube 3 times per day  . insulin aspart  0-15 Units Subcutaneous 6 times per day  . isosorbide dinitrate  10 mg Per Tube BID  . levETIRAcetam  500 mg Intravenous Q24H   And  . levETIRAcetam  500 mg Intravenous Once per day on Sun Tue Thu Sat   And  . levETIRAcetam  1,000 mg Intravenous Once per day on Mon Wed Fri  . metoprolol tartrate  12.5 mg Per Tube BID  . pantoprazole sodium  40 mg Per Tube Daily  . senna-docusate  1 tablet Per Tube BID  .  vitamin C  500 mg Per Tube BID       Electrolyte/Renal Profile and Glucose Profile:   Recent Labs Lab 02/06/15 0827 02/07/15 0211 02/08/15 0246 02/09/15 0644  NA 132* 133* 130* 135  K 5.1 4.7 5.0 4.3  CL 95* 94* 90* 96*  CO2 21* 25 23 26   BUN 57* 74* 97* 65*  CREATININE 7.66* 8.23* 9.43* 7.28*  CALCIUM 8.4* 7.7* 7.4* 8.1*  PHOS 7.1*  6.9*  --   --   --   GLUCOSE 212* 243* 249* 193*   Protein Profile:   Recent Labs Lab 02/06/15 0827 02/09/15 0644  ALBUMIN 3.1* 2.9*      Weight Trend since Admission: Filed Weights   02/08/15 0427 02/08/15 1047 02/09/15 0439  Weight: 132 lb 11.5 oz (60.2 kg) 145 lb 11.6 oz (66.1 kg) 128 lb 4.9 oz (58.2 kg)   .   Diet Order:   NPO, awaiting SLP evaluation  Skin:  Reviewed, no issues   Height:   Ht Readings from Last 1 Encounters:  02/04/15 5\' 6"  (1.676 m)    Weight:   Wt Readings from Last 1 Encounters:  02/09/15 128 lb 4.9 oz (58.2 kg)    Ideal Body Weight:     BMI:  Body mass index is 20.72 kg/(m^2).  Estimated Nutritional Needs:   Kcal:  BEE 1126 kcals (IF 1.1-1.3, AF 1.2) 1610-9604 kcals/d.   Protein:  (1.2-1.5 g/kg) 70-87 g/kg  Fluid:  + UOP  EDUCATION NEEDS:   No education needs identified at this time  HIGH Care Level  Ceira Hoeschen B. Freida Busman, RD, LDN [phone] (pager)

## 2015-02-09 NOTE — Progress Notes (Signed)
Extubated without complications 

## 2015-02-09 NOTE — Progress Notes (Signed)
[facility] - [place] at [facility]   PATIENT NAME: [name]    MR#:  [id number]  DATE OF BIRTH:  [birth year]  SUBJECTIVE:  CHIEF COMPLAINT:  No chief complaint on file.  Extubated. Calm. Copious oral secretions. Following commands  REVIEW OF SYSTEMS:   ROS not answering questions at this time  DRUG ALLERGIES:   Allergies  Allergen Reactions  . Contrast Media [Iodinated Diagnostic Agents] Anaphylaxis  . Oxygen Swelling and Rash    Capnography line oxygen tubing monitor    VITALS:  Blood pressure 100/51, pulse 67, temperature 98.6 F (37 C), temperature source Oral, resp. rate 5, height  (1.676 m), weight 58.2 kg (128 lb 4.9 oz), SpO2 97 %.  PHYSICAL EXAMINATION:  GENERAL:  69 y.o.-year-old patient, no acute distress HEENT: Head atraumatic, normocephalic. Lips and tongue much less swollen, extubated, does have copious oral secretions which I think is chronic for her NECK:  Supple, no jugular venous distention. No thyroid enlargement, no tenderness.  LUNGS: Normal breath sounds bilaterally, no wheezing, rales,rhonchi or crepitation. No use of accessory muscles of respiration. Short shallow respirations CARDIOVASCULAR: S1, S2 normal. No murmurs, rubs, or gallops.  ABDOMEN: Soft, nontender, nondistended. Bowel sounds present. No organomegaly or mass.  EXTREMITIES: No pedal edema, cyanosis, or clubbing.  NEUROLOGIC: Follows simple commands, no focal defects noted  LABORATORY PANEL:   CBC  Recent Labs Lab 02/09/15 0644  WBC 6.7  HGB 12.1  HCT 37.4  PLT 143*   ------------------------------------------------------------------------------------------------------------------  Chemistries   Recent Labs Lab 02/09/15 0644  NA 135  K 4.3  CL 96*  CO2 26  GLUCOSE 193*  BUN 65*  CREATININE 7.28*  CALCIUM 8.1*  AST 23  ALT 19  ALKPHOS 56  BILITOT 0.8    ------------------------------------------------------------------------------------------------------------------  Cardiac Enzymes No results for input(s): TROPONINI in the last 168 hours. ------------------------------------------------------------------------------------------------------------------  RADIOLOGY:  Dg Chest 1 View  02/08/2015  CLINICAL DATA:  Shortness of breath. EXAM: CHEST 1 VIEW COMPARISON:  02/04/2015 . FINDINGS: Endotracheal tube and NG tube in stable position. Cardiomegaly with normal pulmonary vascularity. Low lung volumes with mild bibasilar atelectasis. Tiny right pleural effusion cannot be excluded. No pneumothorax. IMPRESSION: 1. Lines and tubes in stable position. 2. Low lung volumes with mild bibasilar subsegmental atelectasis. Tiny right pleural effusion cannot be excluded. Electronically Signed   By: Maisie Fus  Register   On: 02/08/2015 07:13   Dg Chest Port 1 View  02/09/2015  CLINICAL DATA:  Hypoxia EXAM: PORTABLE CHEST 1 VIEW COMPARISON:  February 08, 2015 FINDINGS: Endotracheal tube tip is 4.0 cm above the carina. Nasogastric tube tip and side port are below the diaphragm. No pneumothorax. There is linear atelectasis in the left mid lung and right lower lung zone regions. Lungs elsewhere clear. Heart is borderline enlarged with pulmonary vascularity within normal limits. No adenopathy. No bone lesions. There is atherosclerotic calcification in the aorta. IMPRESSION: Tube positions as described without pneumothorax. Areas of mild atelectatic change bilaterally. No frank edema or consolidation. Heart borderline enlarged. Electronically Signed   By: Bretta Bang III M.D.   On: 02/09/2015 07:27    EKG:   Orders placed or performed during the hospital encounter of 12/21/14  . EKG 12-Lead  . EKG 12-Lead  . EKG    ASSESSMENT AND PLAN:   1 angioedema with respiratory compromise: Improved, extubated 11/22 - Pulmonology following, stable after extubation -  Trigger or angioedema is unknown. I did review the photographs of the patient which  were taken after her surgery showing welts over her face at the points of contact with the oxygen tubing. She has had a similar event in the past. We'll need to discuss with anesthesia. She will likely need to see an allergist after discharge.  2 altered mental status: Improving. Sluggish but following simple commands - [facility], feels that AMS likely postictal - CT head no acute changes, old L left MCA infarct unchanged - Continue Keppra 500 mg twice a day as well as 500 mg after dialysis  - At baseline patient lives with her daughter. Is mobile. Is alert and mostly oriented. Does have right sided weakness and some aphasia  3 ESRD on HD - Nephrology following. Continue hemodialysis   4  Hypertension - Blood pressure slightly low at this time. Continue metoprolol, isosorbide, hydralazine. Adjust as needed.  5  Coronary artery disease - Continue aspirin Plavix and statin  All the records are reviewed and case discussed with Care Management/Social Workerr. Management plans discussed with the patient, family and they are in agreement. Spoke with her daughter by phone this evening  CODE STATUS: Full   TOTAL CRITICAL CARE  TIME TAKING CARE OF THIS PATIENT: 25 minutes.  Greater than 50% of time spent in care coordination and counseling.  POSSIBLE D/C IN ? DAYS, DEPENDING ON CLINICAL CONDITION.   Elby ShowersWALSH, CATHERINE M.D on 02/09/2015 at 6:09 PM  Between 7am to 6pm - Pager - [phone]  After 6pm go to [email or web address] - password [email or web address]  [facility]  Office  [phone]  CC: Primary care physician; Amy Diana EvesL Krebs, NP

## 2015-02-09 NOTE — Progress Notes (Signed)
   02/09/15 1100  Clinical Encounter Type  Visited With Family;Patient and family together  Visit Type Follow-up  Consult/Referral To Chaplain  Spiritual Encounters  Spiritual Needs Other (Comment)  Stress Factors  Family Stress Factors Other (Comment)  Chaplain was in the unit and spoke to patient and family. Family member desired a nurse so I was able to locate nurse. Chaplain Nakeisha Greenhouse A. Zunaira Lamy Ext. [phone]

## 2015-02-09 NOTE — Progress Notes (Signed)
[facility]  ROUNDING NOTE   Subjective:  Still has some tongue protrusion and swelling. Remains on the ventilator. HD done yesterday, tolerated well Eyes open, looking aound   Objective:  Vital signs in last 24 hours:  Temp:  [97.9 F (36.6 C)-98.6 F (37 C)] 97.9 F (36.6 C) (11/22 0800) Pulse Rate:  [63-105] 79 (11/22 0800) Resp:  [5-26] 21 (11/22 0800) BP: (101-162)/(54-85) 147/72 mmHg (11/22 0800) SpO2:  [97 %-100 %] 100 % (11/22 0800) FiO2 (%):  [28 %] 28 % (11/22 0800) Weight:  [58.2 kg (128 lb 4.9 oz)-66.1 kg (145 lb 11.6 oz)] 58.2 kg (128 lb 4.9 oz) (11/22 0439)  Weight change: 5.9 kg (13 lb 0.1 oz) Filed Weights   02/08/15 0427 02/08/15 1047 02/09/15 0439  Weight: 60.2 kg (132 lb 11.5 oz) 66.1 kg (145 lb 11.6 oz) 58.2 kg (128 lb 4.9 oz)    Intake/Output: I/O last 3 completed shifts: In: 1762.6 [I.V.:81; NG/GT:1466.7; IV Piggyback:215] Out: 1500 [Other:1500]   Intake/Output this shift:     Physical Exam: General: Critically ill appearing  Head:  Moist oral mucosal membranes, tongue swelling and protrusion noted.  Eyes:/ENT Anicteric, ETT in place  Neck: Supple, trachea midline  Lungs:    vent assisted  Heart: S1S2 3/6 crescendo SEM  Abdomen:  Soft, nontender, BS present  Extremities:  trace peripheral edema. Soft restraints on hands  Neurologic: Intubated/sedated  Skin: No lesions  Access: LUE AVG    Basic Metabolic Panel:  Recent Labs Lab 02/05/15 0611 02/06/15 0827 02/07/15 0211 02/08/15 0246 02/09/15 0644  NA 134* 132* 133* 130* 135  K 4.2 5.1 4.7 5.0 4.3  CL 94* 95* 94* 90* 96*  CO2 24 21* 25 23 26   GLUCOSE 152* 212* 243* 249* 193*  BUN 65* 57* 74* 97* 65*  CREATININE 8.61* 7.66* 8.23* 9.43* 7.28*  CALCIUM 8.7* 8.4* 7.7* 7.4* 8.1*  PHOS  --  7.1*  6.9*  --   --   --     Liver Function Tests:  Recent Labs Lab 02/06/15 0827 02/09/15 0644  AST  --  23  ALT  --  19  ALKPHOS  --  56  BILITOT  --  0.8  PROT  --   6.1*  ALBUMIN 3.1* 2.9*   No results for input(s): LIPASE, AMYLASE in the last 168 hours. No results for input(s): AMMONIA in the last 168 hours.  CBC:  Recent Labs Lab 02/05/15 0611 02/06/15 0827 02/07/15 0211 02/08/15 0246 02/09/15 0644  WBC 2.2* 5.3 4.0 4.4 6.7  HGB 12.5 13.7 11.7* 11.4* 12.1  HCT 39.1 43.2 36.2 34.2* 37.4  MCV 87.7 86.8 87.2 86.9 87.9  PLT 135* 127* 119* 125* 143*    Cardiac Enzymes: No results for input(s): CKTOTAL, CKMB, CKMBINDEX, TROPONINI in the last 168 hours.  BNP: Invalid input(s): POCBNP  CBG:  Recent Labs Lab 02/08/15 1607 02/08/15 1943 02/09/15 02/09/15 0408 02/09/15 0725  GLUCAP 129* 155* 172* 140* 196*    Microbiology: Results for orders placed or performed during the hospital encounter of 02/04/15  MRSA PCR Screening     Status: None   Collection Time: 02/04/15  2:05 PM  Result Value Ref Range Status   MRSA by PCR NEGATIVE NEGATIVE Final    Comment:        The GeneXpert MRSA Assay (FDA approved for NASAL specimens only), is one component of a comprehensive MRSA colonization surveillance program. It is not intended to diagnose MRSA infection nor to  guide or monitor treatment for MRSA infections.     Coagulation Studies: No results for input(s): LABPROT, INR in the last 72 hours.  Urinalysis: No results for input(s): COLORURINE, LABSPEC, PHURINE, GLUCOSEU, HGBUR, BILIRUBINUR, KETONESUR, PROTEINUR, UROBILINOGEN, NITRITE, LEUKOCYTESUR in the last 72 hours.  Invalid input(s): APPERANCEUR    Imaging: Dg Chest 1 View  02/08/2015  CLINICAL DATA:  Shortness of breath. EXAM: CHEST 1 VIEW COMPARISON:  02/04/2015 . FINDINGS: Endotracheal tube and NG tube in stable position. Cardiomegaly with normal pulmonary vascularity. Low lung volumes with mild bibasilar atelectasis. Tiny right pleural effusion cannot be excluded. No pneumothorax. IMPRESSION: 1. Lines and tubes in stable position. 2. Low lung volumes with mild bibasilar  subsegmental atelectasis. Tiny right pleural effusion cannot be excluded. Electronically Signed   By: Maisie Fus  Register   On: 02/08/2015 07:13   Dg Chest Port 1 View  02/09/2015  CLINICAL DATA:  Hypoxia EXAM: PORTABLE CHEST 1 VIEW COMPARISON:  February 08, 2015 FINDINGS: Endotracheal tube tip is 4.0 cm above the carina. Nasogastric tube tip and side port are below the diaphragm. No pneumothorax. There is linear atelectasis in the left mid lung and right lower lung zone regions. Lungs elsewhere clear. Heart is borderline enlarged with pulmonary vascularity within normal limits. No adenopathy. No bone lesions. There is atherosclerotic calcification in the aorta. IMPRESSION: Tube positions as described without pneumothorax. Areas of mild atelectatic change bilaterally. No frank edema or consolidation. Heart borderline enlarged. Electronically Signed   By: Bretta Bang III M.D.   On: 02/09/2015 07:27     Medications:     . antiseptic oral rinse  7 mL Mouth Rinse QID  . aspirin  81 mg Per Tube Daily  . atorvastatin  40 mg Oral q1800  . calcium acetate (Phos Binder)  1,334 mg Per Tube TID  . chlorhexidine gluconate  15 mL Mouth Rinse BID  . clopidogrel  75 mg Per Tube Daily  . feeding supplement (VITAL HIGH PROTEIN)  1,000 mL Per Tube Q24H  . free water  100 mL Per Tube 3 times per day  . heparin subcutaneous  5,000 Units Subcutaneous Q12H  . hydrALAZINE  50 mg Per Tube 3 times per day  . insulin aspart  0-15 Units Subcutaneous 6 times per day  . isosorbide dinitrate  10 mg Per Tube BID  . levETIRAcetam  500 mg Intravenous Q24H   And  . levETIRAcetam  500 mg Intravenous Once per day on Sun Tue Thu Sat   And  . levETIRAcetam  1,000 mg Intravenous Once per day on Mon Wed Fri  . metoprolol tartrate  12.5 mg Per Tube BID  . pantoprazole sodium  40 mg Per Tube Daily  . senna-docusate  1 tablet Per Tube BID  . vitamin C  500 mg Per Tube BID   acetaminophen **OR** acetaminophen, alteplase,  fentaNYL (SUBLIMAZE) injection, heparin, lidocaine (PF), lidocaine-prilocaine, ondansetron, phenol  Assessment/ Plan:  69 y.o. female past medical history of end-stage renal disease, myocardial infarction, CVA, hyperlipidemia, hypertension, diabetes mellitus, depression, secondary hyperparathyroidism, dementia, GERD, anemia chronic kidney disease who presented for a fistulogram and ended up having acute respiratory failure and acute tongue swelling/angioedema.  1.  End-stage renal disease on hemodialysis. M-W-F. @ [facility] [address] HD tomorrow  2.  Acute respiratory failure/angioedema.  Cause of angioedema unclear -tongue swelling and protrusion persists,  mechanical ventilation per pulm/cc. - awaiting extubation once Mental status improves  3.  Anemia chronic kidney disease.  Continue to  hold epogen, reassess for need by monitoring CBC.    4.  Secondary hyperparathyroidism.  Phos high at 7.1, start phoslyra solution.    LOS: 5 Franco Duley 11/22/20168:49 AM

## 2015-02-09 NOTE — Progress Notes (Signed)
PT Cancellation Note  Patient Details Name: [name] MRN: [id number] DOB: 01/17/1946   Cancelled Treatment:    Reason Eval/Treat Not Completed: Medical issues which prohibited therapy (Per chart review, patient noted with extubation this AM.  Will maintain current 'start tomorrow' PT order to ensure respiratory stability prior to initiation of activity.)   Fatih Stalvey H. Manson PasseyBrown, PT, DPT, NCS 02/09/2015, 1:49 PM [phone]

## 2015-02-09 NOTE — Progress Notes (Signed)
Took over pt at 20:36.  Pt alert, following commands.  Pt resting comfortably, no signs of distress.

## 2015-02-09 NOTE — Progress Notes (Signed)
Pt desating to 87-88%.  Patient suctioned and 2L nasal cannula placed.  02 SATs increased to 94-95%/  Dr. Sung AmabileSimonds notified of changes and will round on patient and update daughter at bedside after rounds.

## 2015-02-09 NOTE — Progress Notes (Signed)
I have reviewed pt's hospitalization in detail  RASS -1 this AM. Passed SBT. Extubated under my guidance. No distress on multiple checks post extubation. Large amount of oral secretions. Marginal cough. + F/C.   Filed Vitals:   02/09/15 1400 02/09/15 1500 02/09/15 1600 02/09/15 1700  BP: 110/58 114/61 120/56 100/51  Pulse: 70 72 76 67  Temp:   98.6 F (37 C)   TempSrc:   Oral   Resp: 27 26 25 5   Height:      Weight:      SpO2: 99% 99% 98% 97%   Chronically ill appearing, no distress RASS 0, + F/C R facial droop Minimal phonation Tongue and subglottic area soft No JVD seen Chest clear Reg, harsh systolic M NABS, soft, NT Ext warm, no edema Mild R sided weakness  CMP Latest Ref Rng 02/09/2015 02/08/2015 02/07/2015  Glucose 65 - 99 mg/dL 540(J193(H) 811(B249(H) 147(W243(H)  BUN 6 - 20 mg/dL 29(F65(H) 62(Z97(H) 30(Q74(H)  Creatinine 0.44 - 1.00 mg/dL 6.57(Q7.28(H) 4.69(G9.43(H) 2.95(M8.23(H)  Sodium 135 - 145 mmol/L 135 130(L) 133(L)  Potassium 3.5 - 5.1 mmol/L 4.3 5.0 4.7  Chloride 101 - 111 mmol/L 96(L) 90(L) 94(L)  CO2 22 - 32 mmol/L 26 23 25   Calcium 8.9 - 10.3 mg/dL 8.1(L) 7.4(L) 7.7(L)  Total Protein 6.5 - 8.1 g/dL 6.1(L) - -  Total Bilirubin 0.3 - 1.2 mg/dL 0.8 - -  Alkaline Phos 38 - 126 U/L 56 - -  AST 15 - 41 U/L 23 - -  ALT 14 - 54 U/L 19 - -    CBC Latest Ref Rng 02/09/2015 02/08/2015 02/07/2015  WBC 3.6 - 11.0 K/uL 6.7 4.4 4.0  Hemoglobin 12.0 - 16.0 g/dL 84.112.1 11.4(L) 11.7(L)  Hematocrit 35.0 - 47.0 % 37.4 34.2(L) 36.2  Platelets 150 - 440 K/uL 143(L) 125(L) 119(L)    CXR: CM, NAD Echocardiogram 11/21: LVEF 35-40%. Concentric LVH, severe AS  IMPRESSION: VDRF - appears to be tolerating extubation Angioedema resolved. 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 EEG 11/21 negative for epileptiform activity DM2 - improving  control Dysphagia   PLAN/REC: Supplemental O2 to maintain SpO2 > 90% Monitor in ICU post extubation NTS PRN Consider Cardiology evaluation 11/23 Cont NGT and TFs Monitor BMET intermittently Monitor I/Os Correct electrolytes as indicated HD schedule per renal service DVT px: SQ heparin Monitor CBC intermittently Transfuse per usual guidelines Further eval and mgmt of possible seizures per Neurology  Daughter updated @ bedside  CCM time:  40 mins The above time includes time spent in consultation with patient and/or family members and reviewing care plan on multidisciplinary rounds  Billy Fischeravid Simonds, MD PCCM service Mobile [phone] Pager [phone]

## 2015-02-09 NOTE — Progress Notes (Signed)
Patient extubated per order by Jonny RuizJohn, CP.  Patient with normal respiratory status.  RR 18-20.  VSS.  Patient with history of CVA with right side residual and right side facial droop.  02 sats 94-95% on room air.  NG left in place and tube feeds resumed until patient able to be seen by speech.  NG tube placement verified after extubation with auscultation of audible air injection. Nurse will continue to monitor.

## 2015-02-09 NOTE — Care Management Note (Signed)
[facility] [address] [facility]  I will update clinic with additional records at discharge.  Ivor ReiningKim Niles Ess   Dialysis Liaison  [phone]

## 2015-02-10 ENCOUNTER — Encounter: Payer: Self-pay | Admitting: Physician Assistant

## 2015-02-10 ENCOUNTER — Inpatient Hospital Stay: Payer: Medicare Other

## 2015-02-10 DIAGNOSIS — R7989 Other specified abnormal findings of blood chemistry: Secondary | ICD-10-CM

## 2015-02-10 DIAGNOSIS — I429 Cardiomyopathy, unspecified: Secondary | ICD-10-CM

## 2015-02-10 DIAGNOSIS — R1310 Dysphagia, unspecified: Secondary | ICD-10-CM

## 2015-02-10 LAB — CBC
HCT: 36.9 % (ref 35.0–47.0)
Hemoglobin: 11.7 g/dL — ABNORMAL LOW (ref 12.0–16.0)
MCH: 27.6 pg (ref 26.0–34.0)
MCHC: 31.6 g/dL — ABNORMAL LOW (ref 32.0–36.0)
MCV: 87.2 fL (ref 80.0–100.0)
Platelets: 160 10*3/uL (ref 150–440)
RBC: 4.23 MIL/uL (ref 3.80–5.20)
RDW: 21.8 % — ABNORMAL HIGH (ref 11.5–14.5)
WBC: 7.3 10*3/uL (ref 3.6–11.0)

## 2015-02-10 LAB — TROPONIN I
TROPONIN I: 3.17 ng/mL — AB (ref <0.031)
Troponin I: 3.26 ng/mL — ABNORMAL HIGH (ref <0.031)

## 2015-02-10 LAB — GLUCOSE, CAPILLARY
GLUCOSE-CAPILLARY: 116 mg/dL — AB (ref 65–99)
GLUCOSE-CAPILLARY: 110 mg/dL — AB (ref 65–99)
GLUCOSE-CAPILLARY: 118 mg/dL — AB (ref 65–99)
GLUCOSE-CAPILLARY: 150 mg/dL — AB (ref 65–99)
GLUCOSE-CAPILLARY: 173 mg/dL — AB (ref 65–99)
Glucose-Capillary: 173 mg/dL — ABNORMAL HIGH (ref 65–99)
Glucose-Capillary: 174 mg/dL — ABNORMAL HIGH (ref 65–99)

## 2015-02-10 LAB — BASIC METABOLIC PANEL
Anion gap: 15 (ref 5–15)
BUN: 86 mg/dL — AB (ref 6–20)
CALCIUM: 8.2 mg/dL — AB (ref 8.9–10.3)
CHLORIDE: 94 mmol/L — AB (ref 101–111)
CO2: 26 mmol/L (ref 22–32)
CREATININE: 8.62 mg/dL — AB (ref 0.44–1.00)
GFR calc non Af Amer: 4 mL/min — ABNORMAL LOW (ref >60)
GFR, EST AFRICAN AMERICAN: 5 mL/min — AB (ref >60)
Glucose, Bld: 112 mg/dL — ABNORMAL HIGH (ref 65–99)
Potassium: 4.3 mmol/L (ref 3.5–5.1)
SODIUM: 135 mmol/L (ref 135–145)

## 2015-02-10 LAB — MAGNESIUM: Magnesium: 1.9 mg/dL (ref 1.7–2.4)

## 2015-02-10 LAB — PHOSPHORUS: Phosphorus: 4.5 mg/dL (ref 2.5–4.6)

## 2015-02-10 MED ORDER — LEVETIRACETAM 100 MG/ML PO SOLN
500.0000 mg | ORAL | Status: DC
  Filled 2015-02-10: qty 5

## 2015-02-10 MED ORDER — LEVETIRACETAM 100 MG/ML PO SOLN
1000.0000 mg | ORAL | Status: DC
  Administered 2015-02-10: 1000 mg via ORAL
  Filled 2015-02-10: qty 10

## 2015-02-10 MED ORDER — LEVETIRACETAM 100 MG/ML PO SOLN
500.0000 mg | ORAL | Status: DC
  Administered 2015-02-11: 500 mg via ORAL
  Filled 2015-02-10 (×2): qty 5

## 2015-02-10 MED ORDER — FENTANYL CITRATE (PF) 100 MCG/2ML IJ SOLN: 12.5000 ug | INTRAMUSCULAR | Status: DC | PRN

## 2015-02-10 MED ORDER — ATORVASTATIN CALCIUM 20 MG PO TABS
40.0000 mg | ORAL_TABLET | Freq: Every day | ORAL | Status: DC
  Administered 2015-02-10: 20 mg
  Administered 2015-02-11 – 2015-02-16 (×5): 40 mg
  Filled 2015-02-10 (×7): qty 2

## 2015-02-10 NOTE — Evaluation (Signed)
Physical Therapy Evaluation Patient Details Name: Alyson Ingleshilis Fray MRN: [id number] DOB: 03/20/1945 Today's Date: 02/10/2015   History of Present Illness  presented to ER secondary to angioedema with airway compromise requiring intubation (11/17-11/22) after L UE fistulogram (due to thrombosed AV graft); admitted for acute respiratory failure with AMS, angioedema.  Hospital course also significant for possible seizure-like activity, managed with keppra.  Clinical Impression  Upon evaluation, patient alert but minimally responsive/interactive with therapist.  Tracks therapist to R/L, but makes no attempts to verbalize/communicate.  Baseline (per chart) R facial droop and R hemiparesis evident; however, patient globally weak and deconditioned throughout.  Demonstrates, at best, 3-/5 strength in L hemi-body and demonstrates limited active ability despite assist/cuing from therapist.  Currently requiring dep assist for repositioning in bed; not appropriate/unable to tolerate attempts at unsupported sitting or OOB at this time.  Will continue to progress as appropriate. Would benefit from skilled PT to address above deficits and promote optimal return to PLOF; recommend transition to STR upon discharge from acute hospitalization, provided patient able to consistently participate/progress with skilled PT services.     Follow Up Recommendations SNF (pending ability to participate/progress)    Equipment Recommendations       Recommendations for Other Services       Precautions / Restrictions Precautions Precautions: Fall Precaution Comments: NPO, No BP L UE Restrictions Weight Bearing Restrictions: No      Mobility  Bed Mobility Overal bed mobility: Needs Assistance             General bed mobility comments: dep/total care for repositioning in bed  Transfers                 General transfer comment: unsafe/unable  Ambulation/Gait             General Gait Details:  unsafe/unable  Stairs            Wheelchair Mobility    Modified Rankin (Stroke Patients Only)       Balance                                             Pertinent Vitals/Pain Pain Assessment: Faces Pain Score: 0-No pain Pain Location: no clinical indicators of pain    Home Living Family/patient expects to be discharged to:: Private residence Living Arrangements: Children               Additional Comments: Per chart, patient lives with daughter.  Additional information unavailable, patient unable to provide; will verify with family as available.    Prior Function           Comments: Patient unable to provide information; will verify with family as available.     Hand Dominance        Extremity/Trunk Assessment   Upper Extremity Assessment: Generalized weakness (R UE grossly 2-/5, L UE grossly 3-/5; unable to formally test sensation, but responds to light touch bilat)           Lower Extremity Assessment: Generalized weakness (bilat LEs without noted active movement this date, 0/5, question full patient effort.  Negative clonus.  R ankle lacking 5-8 degrees DF)      Cervical / Trunk Assessment:  (maintains R cervical rotation and R lateral flexion; actively corrects to neutral, but does not maintain without external support)  Communication   Communication:  (baseline aphasia; patient  non-verbal throughout session)  Cognition Arousal/Alertness: Awake/alert Behavior During Therapy: WFL for tasks assessed/performed Overall Cognitive Status: Difficult to assess (impaired communication)                      General Comments      Exercises Other Exercises Other Exercises: Bilat UE/LE supine therex, 1x5-8, act asst vs. passive ROM for general strength/endurance and contracture prevention.  Visually attends to therapist on both R/L, minimal active effort elicted.  Repositioned in bed to neutral alignment (tends to  rotate/slide towards R) and placed towel roll to R of head to promote neutral head position; dep for all positioning/mobility.      Assessment/Plan    PT Assessment Patient needs continued PT services  PT Diagnosis Difficulty walking;Generalized weakness;Altered mental status   PT Problem List Decreased strength;Decreased range of motion;Decreased activity tolerance;Decreased balance;Decreased mobility;Decreased cognition;Decreased knowledge of use of DME;Decreased safety awareness;Decreased knowledge of precautions;Cardiopulmonary status limiting activity  PT Treatment Interventions DME instruction;Gait training;Stair training;Functional mobility training;Therapeutic activities;Therapeutic exercise;Patient/family education;Cognitive remediation;Balance training   PT Goals (Current goals can be found in the Care Plan section) Acute Rehab PT Goals PT Goal Formulation: Patient unable to participate in goal setting Time For Goal Achievement: 02/24/15 Potential to Achieve Goals: Fair    Frequency Min 2X/week   Barriers to discharge Inaccessible home environment;Decreased caregiver support      Co-evaluation               End of Session   Activity Tolerance: Patient tolerated treatment well Patient left: with call bell/phone within reach           Time: 1610-9604 PT Time Calculation (min) (ACUTE ONLY): 18 min   Charges:   PT Evaluation $Initial PT Evaluation Tier I: 1 Procedure PT Treatments $Therapeutic Exercise: 8-22 mins   PT G Codes:        Jennavecia Schwier H. Manson Passey, PT, DPT, NCS 02/10/2015, 3:01 PM [phone]

## 2015-02-10 NOTE — Progress Notes (Signed)
HD Tx Complete

## 2015-02-10 NOTE — Progress Notes (Signed)
[facility]  ROUNDING NOTE   Subjective:  Extubated Eyes open, looking around. Did not follow commands Patient seen during dialysis Tolerating well    HEMODIALYSIS FLOWSHEET:  Blood Flow Rate (mL/min): 400 mL/min Arterial Pressure (mmHg): -170 mmHg Venous Pressure (mmHg): 170 mmHg Transmembrane Pressure (mmHg): 20 mmHg Ultrafiltration Rate (mL/min): 570 mL/min Dialysate Flow Rate (mL/min): 600 ml/min Conductivity: Machine : 14 Conductivity: Machine : 14 Dialysis Fluid Bolus: Normal Saline Bolus Amount (mL): 250 mL Intra-Hemodialysis Comments: 606ml.[name].[name].Alert,looking around.Suctioned oral secretions.     Objective:  Vital signs in last 24 hours:  Temp:  [97.5 F (36.4 C)-98.7 F (37.1 C)] 98 F (36.7 C) (11/23 0820) Pulse Rate:  [66-95] 73 (11/23 0945) Resp:  [0-31] 0 (11/23 0945) BP: (100-148)/(51-86) 131/72 mmHg (11/23 0945) SpO2:  [92 %-100 %] 99 % (11/23 0945) Weight:  [62.7 kg (138 lb 3.7 oz)] 62.7 kg (138 lb 3.7 oz) (11/23 0820)  Weight change: -3.4 kg (-7 lb 7.9 oz) Filed Weights   02/09/15 0439 02/10/15 0419 02/10/15 0820  Weight: 58.2 kg (128 lb 4.9 oz) 62.7 kg (138 lb 3.7 oz) 62.7 kg (138 lb 3.7 oz)    Intake/Output: I/O last 3 completed shifts: In: 2440.7 [NG/GT:2230.7; IV Piggyback:210] Out: -    Intake/Output this shift:     Physical Exam: General: Critically ill appearing  Head: Normocephalic   Eyes:/ENT Anicteric, moist membranes  Neck: Supple, trachea midline  Lungs:  Normal effort, clear ant/lat  Heart: S1S2 3/6 crescendo SEM  Abdomen:  Soft, nontender,    Extremities:  trace peripheral edema. Soft restraints on hands  Neurologic: Awake, looking around, not following commands  Skin: No lesions  Access: LUE AVG    Basic Metabolic Panel:  Recent Labs Lab 02/06/15 0827 02/07/15 0211 02/08/15 0246 02/09/15 0644 02/10/15 0658  NA 132* 133* 130* 135 135  K 5.1 4.7 5.0 4.3 4.3  CL 95* 94* 90* 96* 94*  CO2 21* 25 23 26  26   GLUCOSE 212* 243* 249* 193* 112*  BUN 57* 74* 97* 65* 86*  CREATININE 7.66* 8.23* 9.43* 7.28* 8.62*  CALCIUM 8.4* 7.7* 7.4* 8.1* 8.2*  PHOS 7.1*  6.9*  --   --   --  4.5    Liver Function Tests:  Recent Labs Lab 02/06/15 0827 02/09/15 0644  AST  --  23  ALT  --  19  ALKPHOS  --  56  BILITOT  --  0.8  PROT  --  6.1*  ALBUMIN 3.1* 2.9*   No results for input(s): LIPASE, AMYLASE in the last 168 hours. No results for input(s): AMMONIA in the last 168 hours.  CBC:  Recent Labs Lab 02/06/15 0827 02/07/15 0211 02/08/15 0246 02/09/15 0644 02/10/15 0658  WBC 5.3 4.0 4.4 6.7 7.3  HGB 13.7 11.7* 11.4* 12.1 11.7*  HCT 43.2 36.2 34.2* 37.4 36.9  MCV 86.8 87.2 86.9 87.9 87.2  PLT 127* 119* 125* 143* 160    Cardiac Enzymes: No results for input(s): CKTOTAL, CKMB, CKMBINDEX, TROPONINI in the last 168 hours.  BNP: Invalid input(s): POCBNP  CBG:  Recent Labs Lab 02/09/15 1620 02/09/15 1946 02/09/15 2350 02/10/15 0408 02/10/15 0723  GLUCAP 154* 145* 174* 173* 116*    Microbiology: Results for orders placed or performed during the hospital encounter of 02/04/15  MRSA PCR Screening     Status: None   Collection Time: 02/04/15  2:05 PM  Result Value Ref Range Status   MRSA by PCR NEGATIVE NEGATIVE Final  Comment:        The GeneXpert MRSA Assay (FDA approved for NASAL specimens only), is one component of a comprehensive MRSA colonization surveillance program. It is not intended to diagnose MRSA infection nor to guide or monitor treatment for MRSA infections.     Coagulation Studies: No results for input(s): LABPROT, INR in the last 72 hours.  Urinalysis: No results for input(s): COLORURINE, LABSPEC, PHURINE, GLUCOSEU, HGBUR, BILIRUBINUR, KETONESUR, PROTEINUR, UROBILINOGEN, NITRITE, LEUKOCYTESUR in the last 72 hours.  Invalid input(s): APPERANCEUR    Imaging: Dg Abd 1 View  02/10/2015  CLINICAL DATA:  Enteric feeding tube placement. EXAM:  ABDOMEN - 1 VIEW COMPARISON:  02/04/1959 FINDINGS: The metal tip of the enteric feeding tube projects in the left mid abdomen, within the mid to distal stomach. Normal bowel gas pattern. Vascular calcifications are noted along the aorta and its branch vessels. IMPRESSION: 1. Enteric feeding tube tip projects in the mid to distal stomach. Electronically Signed   By: Amie Portland M.D.   On: 02/10/2015 09:34   Dg Chest Port 1 View  02/09/2015  CLINICAL DATA:  Hypoxia EXAM: PORTABLE CHEST 1 VIEW COMPARISON:  February 08, 2015 FINDINGS: Endotracheal tube tip is 4.0 cm above the carina. Nasogastric tube tip and side port are below the diaphragm. No pneumothorax. There is linear atelectasis in the left mid lung and right lower lung zone regions. Lungs elsewhere clear. Heart is borderline enlarged with pulmonary vascularity within normal limits. No adenopathy. No bone lesions. There is atherosclerotic calcification in the aorta. IMPRESSION: Tube positions as described without pneumothorax. Areas of mild atelectatic change bilaterally. No frank edema or consolidation. Heart borderline enlarged. Electronically Signed   By: Bretta Bang III M.D.   On: 02/09/2015 07:27     Medications:   . feeding supplement (NEPRO CARB STEADY) 1,000 mL (02/09/15 1229)   . antiseptic oral rinse  7 mL Mouth Rinse QID  . aspirin  81 mg Per Tube Daily  . atorvastatin  40 mg Per Tube q1800  . calcium acetate (Phos Binder)  1,334 mg Per Tube TID  . chlorhexidine gluconate  15 mL Mouth Rinse BID  . clopidogrel  75 mg Per Tube Daily  . free water  100 mL Per Tube 3 times per day  . heparin subcutaneous  5,000 Units Subcutaneous Q12H  . hydrALAZINE  50 mg Per Tube 3 times per day  . insulin aspart  0-15 Units Subcutaneous 6 times per day  . isosorbide dinitrate  10 mg Per Tube BID  . levETIRAcetam  500 mg Intravenous Q24H   And  . levETIRAcetam  500 mg Intravenous Once per day on Sun Tue Thu Sat   And  . levETIRAcetam   1,000 mg Intravenous Once per day on Mon Wed Fri  . metoprolol tartrate  12.5 mg Per Tube BID  . senna-docusate  1 tablet Per Tube BID  . vitamin C  500 mg Per Tube BID   acetaminophen **OR** acetaminophen, alteplase, fentaNYL (SUBLIMAZE) injection, heparin, lidocaine (PF), lidocaine-prilocaine, ondansetron, phenol  Assessment/ Plan:  69 y.o. female past medical history of end-stage renal disease, myocardial infarction, CVA, hyperlipidemia, hypertension, diabetes mellitus, depression, secondary hyperparathyroidism, dementia, GERD, anemia chronic kidney disease who presented for a fistulogram and ended up having acute respiratory failure and acute tongue swelling/angioedema.  1.  End-stage renal disease on hemodialysis. M-W-F. @ [facility], [address] Patient seen during dialysis Tolerating well   2.  Acute respiratory failure/angioedema.  improved -extubated now  3.  Anemia chronic kidney disease.  Continue to hold epogen, reassess for need by monitoring CBC.    4.  Secondary hyperparathyroidism.  Phos 4.5 continue phoslyra solution.    LOS: 6 Keonta Monceaux 11/23/20169:52 AM

## 2015-02-10 NOTE — Evaluation (Signed)
Clinical/Bedside Swallow Evaluation Patient Details  Name: [name] MRN: [id number] Date of Birth: 06-21-1945  Today's Date: 02/10/2015 Time: SLP Start Time (ACUTE ONLY): 0800 SLP Stop Time (ACUTE ONLY): 0900 SLP Time Calculation (min) (ACUTE ONLY): 60 min  Past Medical History:  Past Medical History  Diagnosis Date  . Stroke (HCC)   . MI (myocardial infarction) (HCC)   . Hearing impaired   . ESRD (end stage renal disease) (HCC)   . Hyperlipidemia   . Hypertension   . Diabetes mellitus without complication (HCC)   . Depression   . Coronary artery disease   . Dementia   . Secondary hyperparathyroidism (HCC)   . Dialysis patient [facility])     Monday, Wednesday and Friday.   . Chronic systolic CHF (congestive heart failure) (HCC)   . GERD (gastroesophageal reflux disease)   . Personal history of transient ischemic attack (TIA) and cerebral infarction without residual deficit   . Anemia   . Moderate aortic stenosis     a. echo 04/2014: 40-50%, moderate AS   Past Surgical History:  Past Surgical History  Procedure Laterality Date  . Cataract extraction, bilateral    . Wrist surgery    . Peripheral vascular catheterization Left 07/28/2014    Procedure: A/V Shuntogram/Fistulagram;  Surgeon: Renford Dills, MD;  Location: [facility] INVASIVE CV LAB;  Service: Cardiovascular;  Laterality: Left;  . Peripheral vascular catheterization Left 07/28/2014    Procedure: A/V Shunt Intervention;  Surgeon: Renford Dills, MD;  Location: [facility] INVASIVE CV LAB;  Service: Cardiovascular;  Laterality: Left;  . Peripheral vascular catheterization Left 02/04/2015    Procedure: A/V Shuntogram/Fistulagram;  Surgeon: Annice Needy, MD;  Location: [facility] INVASIVE CV LAB;  Service: Cardiovascular;  Laterality: Left;  . Peripheral vascular catheterization Left 02/04/2015    Procedure: A/V Shunt Intervention;  Surgeon: Annice Needy, MD;  Location: [facility] INVASIVE CV LAB;  Service: Cardiovascular;  Laterality: Left;    HPI:  pt is a 69 y.o. female with a known history of stroke, coronary artery disease, end-stage renal disease on hemodialysis for 3 years, hyperlipidemia, hypertension, diabetes, secondary, chronic systolic CHF- was scheduled for a fistulogram today the procedure took little longer than expected as per the ER physician who had seen the patient after that but patient tolerated the procedure very well after that when she was in the recovery room she had some swelling on her neck and face and altered mental status with some respiratory distress so CODE BLUE was activated, and ER physician reached over there he found patient had pulse and her blood pressure was also recordable, but due to swelling she was suspected to compromise her airway and so intubated the patient and started on ventilator. Pt was extubated on 02/09/15. She continues to have some oral swelling but this has decreased. Oral secretions noted; R labial weakness. NG out currently. Pt was nonverbal.   Assessment / Plan / Recommendation Clinical Impression  Pt presents w/ increased risk for aspiration sec. to her declined Cognitive awareness for the task of taking po's then swallowing and clearing adequately. Pt required mod.-max. cues to attend to task of taking the po's from the utensil then for manipulating them in her mouth for A-P transfer for swallowing. noted R labial and lingual weakness w/ bolus manipulation; min. anterior sulci residue and min. R labial leakage. t intermittently swallowed spontaneously but exhibited a delayed throat clear and cough w/ trial of puree(1/2 tsp) and immediate coughing w/ single ice chips trials. Pt's  pharyngeal swallows were not immediate and suspect delayed w/ premature spillage into the pharynx. Due to pt's increased risk for aspiration at this time sec. to her declined Cognitive status, weakness, oropharyngeal phase Dysphagia, and quick drowsiness, an oral diet is NOT rec'd at this time. MD will be  replacing the NG tube for TFs at this time per his report. Rec. ST to continue to f/u w/ pt's presentation and trials of ice chips/liquids(no solids while NG in place) to assess readiness for further po trials of foods/liquids hopefully to establish an oral diet, safely in the next 2-3 days. This information was discussed w/ MD/NSG. Rec. oral care frequently daily for oral stim and hygiene.    Aspiration Risk  Severe aspiration risk    Diet Recommendation  NPO status w/ aspiration precautions during oral care; frequent oral care for hygiene and stim.   Medication Administration: Via alternative means    Other  Recommendations Recommended Consults:  (Dietician following; palliative care consult when nec.) Oral Care Recommendations: Oral care QID;Staff/trained caregiver to provide oral care Other Recommendations:  (TBD)   Follow up Recommendations   (TBD)    Frequency and Duration min 3x week  1 week       Prognosis Prognosis for Safe Diet Advancement: Guarded Barriers to Reach Goals: Cognitive deficits;Severity of deficits      Swallow Study   General Date of Onset: 02/04/15 HPI: pt is a 69 y.o. female with a known history of stroke, coronary artery disease, end-stage renal disease on hemodialysis for 3 years, hyperlipidemia, hypertension, diabetes, secondary, chronic systolic CHF- was scheduled for a fistulogram today the procedure took little longer than expected as per the ER physician who had seen the patient after that but patient tolerated the procedure very well after that when she was in the recovery room she had some swelling on her neck and face and altered mental status with some respiratory distress so CODE BLUE was activated, and ER physician reached over there he found patient had pulse and her blood pressure was also recordable, but due to swelling she was suspected to compromise her airway and so intubated the patient and started on ventilator. Pt was extubated on 02/09/15.  She continues to have some oral swelling but this has decreased. Oral secretions noted; R labial weakness. NG out currently. Pt was nonverbal. Type of Study: Bedside Swallow Evaluation Previous Swallow Assessment: none indicated Diet Prior to this Study:  (unknown) Temperature Spikes Noted: No (wbc 7.3) Respiratory Status: Nasal cannula (2 liters) History of Recent Intubation: Yes Date extubated: 02/09/15 Behavior/Cognition: Distractible;Requires cueing;Doesn't follow directions;Confused (awake) Oral Cavity Assessment: Excessive secretions;Edema Oral Care Completed by SLP: Yes Oral Cavity - Dentition: Missing dentition Self-Feeding Abilities: Total assist Patient Positioning: Upright in bed Baseline Vocal Quality:  (nonverbal w/ SLP) Volitional Cough: Cognitively unable to elicit Volitional Swallow: Unable to elicit    Oral/Motor/Sensory Function Overall Oral Motor/Sensory Function:  (unable to formally assess sec. to declined Cognitive status)   Ice Chips Ice chips: Impaired Presentation: Spoon (fed; 3 trials) Oral Phase Impairments: Reduced labial seal;Reduced lingual movement/coordination;Impaired mastication;Poor awareness of bolus Oral Phase Functional Implications: Oral residue;Prolonged oral transit;Right anterior spillage (anterior sulci residue) Pharyngeal Phase Impairments: Cough - Immediate;Cough - Delayed Other Comments: lingual protrusion during swallowing   Thin Liquid Thin Liquid: Not tested    Nectar Thick Nectar Thick Liquid: Not tested   Honey Thick Honey Thick Liquid: Not tested   Puree Puree: Impaired Presentation: Spoon (fed; 2 trials) Oral Phase Impairments:  Reduced labial seal;Reduced lingual movement/coordination;Impaired mastication;Poor awareness of bolus Oral Phase Functional Implications: Right lateral sulci pocketing;Left lateral sulci pocketing;Prolonged oral transit;Oral residue (anterior sulci residue) Pharyngeal Phase Impairments: Cough - Delayed  (x1) Other Comments: lingual protrusion during swallowing   Solid Solid: Not tested      Jerilynn SomKatherine Kennadee Walthour, MS, CCC-SLP  Jarica Plass 02/10/2015,6:08 PM

## 2015-02-10 NOTE — Progress Notes (Signed)
Phoned elink and made MD aware of second elevated troponin.  Troponin level now at 3.17 which is trending down.  Pt is having some occasional pvc's.  Per MD, Dr. Solon AugustaBynum, get a magnesium level drawn.

## 2015-02-10 NOTE — Progress Notes (Signed)
PT Cancellation Note  Patient Details Name: [name] MRN: [id number] DOB: 08/25/1945   Cancelled Treatment:    Reason Eval/Treat Not Completed: Patient at procedure or test/unavailable (Evaluation re-attempted.  Patient currently receiving dialysis.  Will re-attempt in PM as patient available and medically appropriate.)   Velena Keegan H. Manson PasseyBrown, PT, DPT, NCS 02/10/2015, 8:47 AM [phone]

## 2015-02-10 NOTE — Progress Notes (Addendum)
Nutrition Follow-up     INTERVENTION:   EN: plan to restart TF via dobhoff; recommend continuing Nepro at current goal rate as per order via dobhoff   NUTRITION DIAGNOSIS:   Inadequate oral intake related to acute illness as evidenced by NPO status. Being addressed via TF  GOAL:   Patient will meet greater than or equal to 90% of their needs  MONITOR:    (Energy Intake, Anthropometrics, Electrolyte/Renal Profile, Glucose Profile, Pulmonary)  REASON FOR ASSESSMENT:   Ventilator, Consult Enteral/tube feeding initiation and management  ASSESSMENT:    Pt pulled out NG during the night, dobhoff inserted this AM, did not pass SLP swallow eval this AM, Pt alert but not following commands, received HD today  Diet Order:  Diet NPO time specified   EN: TF held due to no access, plan to restart via dobhoff  Digestive System: +BM 11/23  Skin:  Reviewed, no issues  Electrolyte and Renal Profile:  Recent Labs Lab 02/06/15 0827  02/08/15 0246 02/09/15 0644 02/10/15 0658  BUN 57*  < > 97* 65* 86*  CREATININE 7.66*  < > 9.43* 7.28* 8.62*  NA 132*  < > 130* 135 135  K 5.1  < > 5.0 4.3 4.3  PHOS 7.1*  6.9*  --   --   --  4.5  < > = values in this interval not displayed. Glucose Profile:  Recent Labs  02/09/15 2350 02/10/15 0408 02/10/15 0723  GLUCAP 174* 173* 116*   Meds: senokot, novolog, calcium acetate  Height:   Ht Readings from Last 1 Encounters:  02/04/15 5\' 6"  (1.676 m)    Weight:   Wt Readings from Last 1 Encounters:  02/10/15 130 lb 1.1 oz (59 kg)    Filed Weights   02/10/15 0419 02/10/15 0820 02/10/15 1230  Weight: 138 lb 3.7 oz (62.7 kg) 138 lb 3.7 oz (62.7 kg) 130 lb 1.1 oz (59 kg)  ' BMI:  Body mass index is 21 kg/(m^2).  Estimated Nutritional Needs:   Kcal:  BEE 1126 kcals (IF 1.1-1.3, AF 1.2) [id number] kcals/d.   Protein:  (1.2-1.5 g/kg) 70-87 g/kg  Fluid:  1000ml + UOP  EDUCATION NEEDS:   No education needs identified at this  time  HIGH Care Level  Romelle Starcherate Wakisha Alberts MS, RD, LDN [phone] Pager

## 2015-02-10 NOTE — Progress Notes (Signed)
Pre-hd tx 

## 2015-02-10 NOTE — Progress Notes (Signed)
I informed Dr. Sung AmabileSimonds of troponin of 3.26 and he asked me to notify cardiology whom I have paged and am waiting for a call back

## 2015-02-10 NOTE — Progress Notes (Signed)
Dobhoff placed in right nare. KUB ordered

## 2015-02-10 NOTE — Progress Notes (Signed)
Post Tx Assessment. °

## 2015-02-10 NOTE — Progress Notes (Addendum)
[facility] [place] [facility]   PATIENT NAME: [name]    MR#:  [id number]  DATE OF BIRTH:  1945/04/07  SUBJECTIVE:  CHIEF COMPLAINT:  No chief complaint on file.  Extubated. Calm. Copious oral secretions. Following some commands  REVIEW OF SYSTEMS:   ROS not answering many questions at this time  DRUG ALLERGIES:   Allergies  Allergen Reactions  . Contrast Media [Iodinated Diagnostic Agents] Anaphylaxis  . Oxygen Swelling and Rash    Capnography line oxygen tubing monitor    VITALS:  Blood pressure 140/64, pulse 71, temperature 97.6 F (36.4 C), temperature source Axillary, resp. rate 16, height  (1.676 m), weight 59 kg (130 lb 1.1 oz), SpO2 100 %.  PHYSICAL EXAMINATION:  GENERAL:  69 y.o.-year-old patient, no acute distress HEENT: Head atraumatic, normocephalic. Lips and tongue much less swollen, extubated, does have copious oral secretions which I think is chronic for her NECK:  Supple, no jugular venous distention. No thyroid enlargement, no tenderness.  LUNGS: Normal breath sounds bilaterally, no wheezing, rales,rhonchi or crepitation. No use of accessory muscles of respiration. Short shallow respirations CARDIOVASCULAR: S1, S2 normal. No murmurs, rubs, or gallops.  ABDOMEN: Soft, nontender, nondistended. Bowel sounds present. No organomegaly or mass.  EXTREMITIES: No pedal edema, cyanosis, or clubbing.  NEUROLOGIC: Follows simple commands, no focal defects noted  LABORATORY PANEL:   CBC  Recent Labs Lab 02/10/15 0658  WBC 7.3  HGB 11.7*  HCT 36.9  PLT 160   ------------------------------------------------------------------------------------------------------------------  Chemistries   Recent Labs Lab 02/09/15 0644 02/10/15 0658  NA 135 135  K 4.3 4.3  CL 96* 94*  CO2 26 26  GLUCOSE 193* 112*  BUN 65* 86*  CREATININE 7.28* 8.62*  CALCIUM 8.1* 8.2*  AST 23  --   ALT 19  --   ALKPHOS 56  --   BILITOT 0.8   --    ------------------------------------------------------------------------------------------------------------------  Cardiac Enzymes  Recent Labs Lab 02/10/15 1423  TROPONINI 3.26*   ------------------------------------------------------------------------------------------------------------------  RADIOLOGY:  Dg Abd 1 View  02/10/2015  CLINICAL DATA:  Enteric feeding tube placement. EXAM: ABDOMEN - 1 VIEW COMPARISON:  02/04/1959 FINDINGS: The metal tip of the enteric feeding tube projects in the left mid abdomen, within the mid to distal stomach. Normal bowel gas pattern. Vascular calcifications are noted along the aorta and its branch vessels. IMPRESSION: 1. Enteric feeding tube tip projects in the mid to distal stomach. Electronically Signed   By: Amie Portland M.D.   On: 02/10/2015 09:34   Dg Chest Port 1 View  02/09/2015  CLINICAL DATA:  Hypoxia EXAM: PORTABLE CHEST 1 VIEW COMPARISON:  February 08, 2015 FINDINGS: Endotracheal tube tip is 4.0 cm above the carina. Nasogastric tube tip and side port are below the diaphragm. No pneumothorax. There is linear atelectasis in the left mid lung and right lower lung zone regions. Lungs elsewhere clear. Heart is borderline enlarged with pulmonary vascularity within normal limits. No adenopathy. No bone lesions. There is atherosclerotic calcification in the aorta. IMPRESSION: Tube positions as described without pneumothorax. Areas of mild atelectatic change bilaterally. No frank edema or consolidation. Heart borderline enlarged. Electronically Signed   By: Bretta Bang III M.D.   On: 02/09/2015 07:27    EKG:   Orders placed or performed during the hospital encounter of 02/04/15  . EKG 12-Lead  . EKG 12-Lead    ASSESSMENT AND PLAN:   * NSTEMI: troponins 3.26, Cardio already on board. Recent echo showing  EF 35-40%, further mgmt per cardio.  1 angioedema with respiratory compromise: Improved, extubated 11/22 - Pulmonology  following, stable after extubation - Trigger or angioedema is unknown. I did review the photographs of the patient which were taken after her surgery showing welts over her face at the points of contact with the oxygen tubing. She has had a similar event in the past. She will likely need to see an allergist after discharge.  2 altered mental status: Improving. 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 Is mobile. Is alert and mostly oriented. Does have right sided weakness and some aphasia  3 ESRD on HD - Nephrology following. Continue hemodialysis   4  Hypertension - Blood pressure slightly low at this time. Continue metoprolol, isosorbide, hydralazine. Adjust as needed.  5  Coronary artery disease - Continue aspirin Plavix and statin  6. Nutrition: Cont NGT and TFs for now.   All the records are reviewed and case discussed with Care Management/Social Worker. Management plans discussed with the patient, family and they are in agreement. Spoke with [name].  CODE STATUS: Full   TOTAL TIME TAKING CARE OF THIS PATIENT: 25 minutes.   Greater than 50% of time spent in care coordination and counseling.   POSSIBLE D/C IN ? DAYS, DEPENDING ON CLINICAL CONDITION. Can be transferred to floors per PCCM recommendations. I'm ok with that.   [facility], [name] M.D on 02/10/2015 at 5:14 PM  Between 7am to 6pm - Pager - [phone]  After 6pm go to [email or web address] - password [email or web address] [facility]  [facility]  Office  [phone]  CC: Primary care physician; Amy Diana EvesL Krebs, NP

## 2015-02-10 NOTE — Progress Notes (Signed)
Dr. Sung AmabileSimonds reviewed KUB and said dobhoff may be used.

## 2015-02-10 NOTE — Consult Note (Signed)
Cardiology Consultation Note  Patient ID: Alyson Ingleshilis Dercole, MRN: [id number], DOB/AGE: 69/05/1945 69 y.o. Admit date: 02/04/2015   Date of Consult: 02/10/2015 Primary Physician: Filbert BertholdAmy L Krebs, NP Primary Cardiologist: Dr. Mariah MillingGollan, MD  Chief Complaint: AV graft fistula change Reason for Consult: Aortic stenosis  HPI: 69 y.o. female with h/o known moderate aortic stenosis, CAD with NSTEMI in 04/2014 with peak troponin of 21 managed medically, chronic systolic CHF with EF 40-50% by echo 04/2014, ESRD on HD Monday, Wednesday, and Friday, TIA, DM2, HTN, HLD, and anemia of chronic disease who presented to [facility] for AV graft salvage on 11/17, subsequently she developed acute angioedema requiring intubation and was placed on sedation requiring full ventilation support. She has since been extubated on 11/22. Echo showed slight decline in EF to 35-40%, severe aortic stenosis with midl AI. VTI 0.44, Vmax 0.39, Vmean 0.41, mean gradient 34 mm Hg, peak gradient 59 mm Hg. Mild MR, mild biatrial enlargement, PASP 24 mm Hg. Cardiology is consulted for further evaluation of her aortic stenosis. She is currently working with PT and non-verbal.     Past Medical History  Diagnosis Date  . Stroke (HCC)   . MI (myocardial infarction) (HCC)   . Hearing impaired   . ESRD (end stage renal disease) (HCC)   . Hyperlipidemia   . Hypertension   . Diabetes mellitus without complication (HCC)   . Depression   . Coronary artery disease   . Dementia   . Secondary hyperparathyroidism (HCC)   . Dialysis patient [facility](HCC)     Monday, Wednesday and Friday.   . Chronic systolic CHF (congestive heart failure) (HCC)   . GERD (gastroesophageal reflux disease)   . Personal history of transient ischemic attack (TIA) and cerebral infarction without residual deficit   . Anemia   . Moderate aortic stenosis     a. echo 04/2014: 40-50%, moderate AS      Most Recent Cardiac Studies: Echo 04/22/2014: Summary:  1. Left ventricular ejection  fraction, by visual estimation, is 40-50%.  2. Mildly to moderately decreased global left ventricular systolic  function.  3. Basal and mid inferior wall is abnormal.  4. Elevated left atrial and left ventricular end-diastolic pressures.  5. Pseudonormal pattern of LV diastolic filling.  6. Severe concentric left ventricular hypertrophy.  7. Severely increased left ventricular septal thickness.  8. Moderately dilated left atrium.  9. The pericardial effusion is globally pericardial effusion. 10. Trivial pericardial effusion, as described above. 11. Mild to moderate mitral valve regurgitation. 12. Moderate aortic valve stenosis. 13. Moderately elevated pulmonary artery systolic pressure. 14. Moderately increased left ventricular posterior wall thickness. 15. Estimated PA pressure is probably more consistent with 40-45 mmHg -  Mild-Moderate (likely secondary) Pulmonary HTN. 16. Mild global Hypokinesis with more prominent hypokinesis in the basal  to mid inferior-inferoseptal wall. This would be consistent with RCA  distribution.  Echo 02/08/2015: Study Conclusions  - Left ventricle: There was moderate concentric hypertrophy. Systolic function was moderately reduced. The estimated ejection fraction was in the range of 35% to 40%. - Aortic valve: There was severe stenosis. There was mild regurgitation. Valve area (VTI): 0.44 cm^2. Valve area (Vmax): 0.39 cm^2. Valve area (Vmean): 0.41 cm^2. - Mitral valve: There was mild regurgitation. - Left atrium: The atrium was mildly dilated. - Right atrium: The atrium was mildly dilated. - Tricuspid valve: There was moderate regurgitation. - Pulmonic valve: Peak gradient (S): 24 mm Hg.   Surgical History:  Past Surgical History  Procedure Laterality Date  .  Cataract extraction, bilateral    . Wrist surgery    . Peripheral vascular catheterization Left 07/28/2014    Procedure: A/V Shuntogram/Fistulagram;  Surgeon: Renford Dills,  MD;  Location: [facility] INVASIVE CV LAB;  Service: Cardiovascular;  Laterality: Left;  . Peripheral vascular catheterization Left 07/28/2014    Procedure: A/V Shunt Intervention;  Surgeon: Renford Dills, MD;  Location: [facility] INVASIVE CV LAB;  Service: Cardiovascular;  Laterality: Left;  . Peripheral vascular catheterization Left 02/04/2015    Procedure: A/V Shuntogram/Fistulagram;  Surgeon: Annice Needy, MD;  Location: [facility] INVASIVE CV LAB;  Service: Cardiovascular;  Laterality: Left;  . Peripheral vascular catheterization Left 02/04/2015    Procedure: A/V Shunt Intervention;  Surgeon: Annice Needy, MD;  Location: [facility] INVASIVE CV LAB;  Service: Cardiovascular;  Laterality: Left;     Home Meds: Prior to Admission medications   Medication Sig Start Date End Date Taking? Authorizing Provider  aspirin EC 81 MG tablet Take 81 mg by mouth daily.   Yes Historical Provider, MD  atorvastatin (LIPITOR) 40 MG tablet Take 40 mg by mouth daily at 6 PM.   Yes Historical Provider, MD  calcium acetate (PHOSLO) 667 MG capsule Take 1,334 mg by mouth 3 (three) times daily with meals.    Yes Historical Provider, MD  clopidogrel (PLAVIX) 75 MG tablet Take 1 tablet (75 mg total) by mouth daily. 09/07/14  Yes Antonieta Iba, MD  COD LIVER OIL PO Take 530 mg by mouth daily.   Yes Historical Provider, MD  docusate sodium (COLACE) 100 MG capsule Take 100 mg by mouth at bedtime.    Yes Historical Provider, MD  hydrALAZINE (APRESOLINE) 25 MG tablet Take 1 tablet (25 mg total) by mouth 3 (three) times daily. Patient taking differently: Take 25 mg by mouth 2 (two) times daily.  07/17/14  Yes Antonieta Iba, MD  isosorbide mononitrate (IMDUR) 30 MG 24 hr tablet Take 1 tablet (30 mg total) by mouth daily. 08/11/14  Yes Antonieta Iba, MD  lactobacillus acidophilus (BACID) TABS tablet Take 1 tablet by mouth at bedtime.   Yes Historical Provider, MD  lisinopril (PRINIVIL,ZESTRIL) 5 MG tablet Take 5 mg by mouth daily.   Yes  Historical Provider, MD  metoprolol tartrate (LOPRESSOR) 25 MG tablet Take 25 mg by mouth 2 (two) times daily.   Yes Historical Provider, MD  Omega-3 Fatty Acids (FISH OIL) 500 MG CAPS Take 500 mg by mouth 2 (two) times daily.   Yes Historical Provider, MD  ranitidine (ZANTAC) 150 MG capsule Take 1 capsule (150 mg total) by mouth every evening. Patient taking differently: Take 150 mg by mouth daily.  10/02/14  Yes Amy Rusty Aus, NP  Vitamin D, Ergocalciferol, (DRISDOL) 50000 UNITS CAPS capsule Take 50,000 Units by mouth every 30 (thirty) days. Pt takes on the 7th of every month.   Yes Historical Provider, MD  atorvastatin (LIPITOR) 80 MG tablet Take 1 tablet (80 mg total) by mouth daily. 08/11/14   Antonieta Iba, MD  insulin aspart (NOVOLOG) 100 UNIT/ML injection Inject 3-13 Units into the skin 3 (three) times daily with meals. Sliding scale    Historical Provider, MD  lisinopril (PRINIVIL,ZESTRIL) 20 MG tablet Take 1 tablet (20 mg total) by mouth daily. 07/16/14   Antonieta Iba, MD  LORazepam (ATIVAN) 2 MG/ML concentrated solution Take 2 mg by mouth as needed for anxiety.    Historical Provider, MD  Nutritional Supplements (FEEDING SUPPLEMENT, NEPRO CARB STEADY,) LIQD Take 237 mLs  by mouth 2 (two) times daily between meals. 12/05/14   Gale Journey, MD  promethazine (PHENERGAN) 25 MG tablet Take 25 mg by mouth every 6 (six) hours as needed for nausea or vomiting.    Historical Provider, MD  vitamin C (ASCORBIC ACID) 500 MG tablet Take 500 mg by mouth 2 (two) times daily.     Historical Provider, MD    Inpatient Medications:  . antiseptic oral rinse  7 mL Mouth Rinse QID  . aspirin  81 mg Per Tube Daily  . atorvastatin  40 mg Per Tube q1800  . calcium acetate (Phos Binder)  1,334 mg Per Tube TID  . chlorhexidine gluconate  15 mL Mouth Rinse BID  . clopidogrel  75 mg Per Tube Daily  . free water  100 mL Per Tube 3 times per day  . heparin subcutaneous  5,000 Units Subcutaneous Q12H  .  hydrALAZINE  50 mg Per Tube 3 times per day  . insulin aspart  0-15 Units Subcutaneous 6 times per day  . isosorbide dinitrate  10 mg Per Tube BID  . [START ON 02/11/2015] levETIRAcetam  500 mg Oral Q24H   And  . levETIRAcetam  1,000 mg Oral Q M,W,F-1800   And  . [START ON 02/11/2015] levETIRAcetam  500 mg Oral Q T,Th,S,Su-1800  . metoprolol tartrate  12.5 mg Per Tube BID  . senna-docusate  1 tablet Per Tube BID  . vitamin C  500 mg Per Tube BID   . feeding supplement (NEPRO CARB STEADY) 1,000 mL (02/10/15 1256)    Allergies:  Allergies  Allergen Reactions  . Contrast Media [Iodinated Diagnostic Agents] Anaphylaxis  . Oxygen Swelling and Rash    Capnography line oxygen tubing monitor    Social History   Social History  . Marital Status: Widowed    Spouse Name: N/A  . Number of Children: N/A  . Years of Education: N/A   Occupational History  . Not on file.   Social History Main Topics  . Smoking status: Never Smoker   . Smokeless tobacco: Not on file  . Alcohol Use: No  . Drug Use: No  . Sexual Activity: Not on file   Other Topics Concern  . Not on file   Social History Narrative     Family History  Problem Relation Age of Onset  . CAD Father      Review of Systems: Review of Systems  Unable to perform ROS: patient nonverbal     Labs: No results for input(s): CKTOTAL, CKMB, TROPONINI in the last 72 hours. Lab Results  Component Value Date   WBC 7.3 02/10/2015   HGB 11.7* 02/10/2015   HCT 36.9 02/10/2015   MCV 87.2 02/10/2015   PLT 160 02/10/2015     Recent Labs Lab 02/09/15 0644 02/10/15 0658  NA 135 135  K 4.3 4.3  CL 96* 94*  CO2 26 26  BUN 65* 86*  CREATININE 7.28* 8.62*  CALCIUM 8.1* 8.2*  PROT 6.1*  --   BILITOT 0.8  --   ALKPHOS 56  --   ALT 19  --   AST 23  --   GLUCOSE 193* 112*   Lab Results  Component Value Date   CHOL 256* 04/22/2014   HDL 73* 04/22/2014   LDLCALC 179* 04/22/2014   TRIG 32 02/07/2015   No results  found for: DDIMER  Radiology/Studies:  Dg Chest 1 View  02/08/2015  CLINICAL DATA:  Shortness of breath. EXAM: CHEST  1 VIEW COMPARISON:  02/04/2015 . FINDINGS: Endotracheal tube and NG tube in stable position. Cardiomegaly with normal pulmonary vascularity. Low lung volumes with mild bibasilar atelectasis. Tiny right pleural effusion cannot be excluded. No pneumothorax. IMPRESSION: 1. Lines and tubes in stable position. 2. Low lung volumes with mild bibasilar subsegmental atelectasis. Tiny right pleural effusion cannot be excluded. Electronically Signed   By: Maisie Fus  Register   On: 02/08/2015 07:13   X-ray Chest Pa Or Ap  02/04/2015  CLINICAL DATA:  Postop cardiac catheterization, the patient coded while in special procedures EXAM: CHEST 1 VIEW COMPARISON:  Portable chest x-ray of 12/02/2014 FINDINGS: The tip of the endotracheal tube is approximately 2.1 cm above the carina. The lungs appear well aerated. No infiltrate or effusion is seen. Cardiomegaly is stable. IMPRESSION: 1. Endotracheal tube tip 2.1 cm above the carina. 2. Improved aeration of the lungs. Electronically Signed   By: Dwyane Dee M.D.   On: 02/04/2015 13:26   Dg Abd 1 View  02/10/2015  CLINICAL DATA:  Enteric feeding tube placement. EXAM: ABDOMEN - 1 VIEW COMPARISON:  02/04/1959 FINDINGS: The metal tip of the enteric feeding tube projects in the left mid abdomen, within the mid to distal stomach. Normal bowel gas pattern. Vascular calcifications are noted along the aorta and its branch vessels. IMPRESSION: 1. Enteric feeding tube tip projects in the mid to distal stomach. Electronically Signed   By: Amie Portland M.D.   On: 02/10/2015 09:34   Dg Abd 1 View  02/04/2015  CLINICAL DATA:  NG tube placement. EXAM: ABDOMEN - 1 VIEW COMPARISON:  12/21/2014 FINDINGS: The NG tube tip is in the fundal/ body region of the stomach. The bowel gas pattern is unremarkable. External pacer paddles are noted over the chest. IMPRESSION: NG tube tip  is in the fundal/ body region of the stomach. Electronically Signed   By: Rudie Meyer M.D.   On: 02/04/2015 14:33   Ct Head Wo Contrast  02/28/15  CLINICAL DATA:  Coded after vascular procedure for thrombosed AV graft. Acute respiratory failure, end-stage renal disease on dialysis. History of stroke, hypertension, diabetes, dementia. EXAM: CT HEAD WITHOUT CONTRAST TECHNIQUE: Contiguous axial images were obtained from the base of the skull through the vertex without intravenous contrast. COMPARISON:  CT head December 02, 2014 FINDINGS: No intraparenchymal hemorrhage, mass effect, midline shift or acute large vascular territory infarcts. LEFT parietal encephalomalacia, LEFT occipital lobe encephalomalacia, unchanged. Old bilateral thalamus lacunar infarcts. Patchy to confluent supratentorial white matter hypodensities are unchanged. No abnormal extra-axial fluid collections. Nasogastric tube via LEFT nares . Severe calcific atherosclerosis of the carotid siphons. Imaged paranasal sinuses and mastoid air cells are well aerated. Included ocular globes and orbital contents are normal. IMPRESSION: No acute intracranial process. 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 Electronically Signed   By: Awilda Metro M.D.   On: 02/28/15 00:18   Dg Chest Port 1 View  02/09/2015  CLINICAL DATA:  Hypoxia EXAM: PORTABLE CHEST 1 VIEW COMPARISON:  February 08, 2015 FINDINGS: Endotracheal tube tip is 4.0 cm above the carina. Nasogastric tube tip and side port are below the diaphragm. No pneumothorax. There is linear atelectasis in the left mid lung and right lower lung zone regions. Lungs elsewhere clear. Heart is borderline enlarged with pulmonary vascularity within normal limits. No adenopathy. No bone lesions. There is atherosclerotic calcification in the aorta. IMPRESSION: Tube  positions as described without pneumothorax. Areas of mild atelectatic change bilaterally. No frank edema or consolidation. Heart borderline enlarged. Electronically Signed   By: Bretta Bang III M.D.   On: 02/09/2015 07:27    EKG: not done  Weights: Filed Weights   02/10/15 0419 02/10/15 0820 02/10/15 1230  Weight: 138 lb 3.7 oz (62.7 kg) 138 lb 3.7 oz (62.7 kg) 130 lb 1.1 oz (59 kg)     Physical Exam: Blood pressure 136/63, pulse 84, temperature 97.9 F (36.6 C), temperature source Axillary, resp. rate 21, height  (1.676 m), weight 130 lb 1.1 oz (59 kg), SpO2 100 %. Body mass index is 21 kg/(m^2). General: Well developed, well nourished, in no acute distress. Head: Normocephalic, atraumatic, sclera non-icteric, no xanthomas, nares are without discharge.  Neck: Negative for carotid bruits. JVD not elevated. Lungs: Clear bilaterally to auscultation without wheezes, rales, or rhonchi. Breathing is unlabored. Heart: RRR with S1 S2. III/VI harsh systolic murmur heard throughout. No rubs or gallops appreciated. Abdomen: Soft, non-tender, non-distended with normoactive bowel sounds. No hepatomegaly. No rebound/guarding. No obvious abdominal masses. Msk:  Strength and tone appear normal for age. Extremities: No clubbing or cyanosis. No edema.  Distal pedal pulses are 2+ and equal bilaterally. Neuro: Alert. No facial asymmetry. No focal deficit. Moves all extremities spontaneously. Psych:  Non-verbal.    Assessment and Plan:   1. Known moderate to severe aortic stenosis: -Given the known chronicity of her aortic stenosis and underlying comorbidities would likely mange medically at this time -Could revisit as an outpatient if indicated  -Volume dependent  -Continue current medications   2. CAD: -Non-verbal, no ECG, no troponin. Unable to determine if patient has had acute ischemic event given her mild decline in EF -Check troponin and ECG -Continue Lopressor, aspirin, Plavix,  and statin  3. Ischemic cardiomyopathy: -Volume managed by HD -Continue Lopressor 12.5 mg bid and Isordil  -Echo x 2 as above  4. AMS: -Per IM  5. ESRD: -Per Renal  6. Elevated troponin: -Of uncertain etiology, possibly in the acute setting of her acute respiratory failure  -Will likely manage medically at this time given the above -Will discuss with MD   Signed, Eula Listen, PA-C Pager: [phone] 02/10/2015, 1:39 PM  Attending Note:   The patient was seen and examined.  Agree with assessment and plan as noted above.  Changes made to the above note as needed.  Pt has known aortic stenosis.  Was graded as moderate at her last echo and was graded as severe on the recent echo. She has a hx of a recent NSTEMI and was treated medically. She is not a good candidate for invasive cardiology procedures / interventions Troponin levels are elevated but this may be due to increased cardiac  strain in the setting of renal failure. It could represent significant CAD but I would agree with medical therapy .   Would continue current medical therapy   Alvia Grove., MD, [facility] 02/10/2015, 4:58 PM [address]  [address] Office [phone] Pager [phone]

## 2015-02-10 NOTE — Progress Notes (Signed)
Informed Eula Listenyan Dunn, PA of troponin of 3.26

## 2015-02-10 NOTE — Progress Notes (Signed)
Pt pulled out NG tube, mittens had been placed on both hands prior to, but still was able to pull tube.  Spoke with Dr. Sheryle Hailiamond who was present on unit, per Dr. Sheryle Hailiamond, leave NG tube out and will reassess the need in the morning.

## 2015-02-10 NOTE — Progress Notes (Signed)
I have reviewed pt's hospitalization in detail  RASS 0. Tolerating extubation but marginal cough and handling oral secretions poorly. Failed SLP eval. NGT replaced   Filed Vitals:   02/10/15 1145 02/10/15 1200 02/10/15 1215 02/10/15 1230  BP: 128/63 122/58 141/63 136/63  Pulse: 73 71 82 84  Temp:    97.9 F (36.6 C)  TempSrc:    Axillary  Resp: 13 16 13 21   Height:      Weight:    59 kg (130 lb 1.1 oz)  SpO2: 100% 100% 100% 100%   Chronically ill appearing, no distress RASS 0, + F/C R facial droop Minimal phonation Copious oral secretions No JVD seen Chest clear Reg rhythm, harsh systolic M radiating to neck NABS, soft, NT Ext warm, no edema Mild R sided weakness  CMP Latest Ref Rng 02/10/2015 02/09/2015 02/08/2015  Glucose 65 - 99 mg/dL 161(W112(H) 960(A193(H) 540(J249(H)  BUN 6 - 20 mg/dL 81(X86(H) 91(Y65(H) 78(G97(H)  Creatinine 0.44 - 1.00 mg/dL 9.56(O8.62(H) 1.30(Q7.28(H) 6.57(Q9.43(H)  Sodium 135 - 145 mmol/L 135 135 130(L)  Potassium 3.5 - 5.1 mmol/L 4.3 4.3 5.0  Chloride 101 - 111 mmol/L 94(L) 96(L) 90(L)  CO2 22 - 32 mmol/L 26 26 23   Calcium 8.9 - 10.3 mg/dL 8.2(L) 8.1(L) 7.4(L)  Total Protein 6.5 - 8.1 g/dL - 6.1(L) -  Total Bilirubin 0.3 - 1.2 mg/dL - 0.8 -  Alkaline Phos 38 - 126 U/L - 56 -  AST 15 - 41 U/L - 23 -  ALT 14 - 54 U/L - 19 -    CBC Latest Ref Rng 02/10/2015 02/09/2015 02/08/2015  WBC 3.6 - 11.0 K/uL 7.3 6.7 4.4  Hemoglobin 12.0 - 16.0 g/dL 11.7(L) 12.1 11.4(L)  Hematocrit 35.0 - 47.0 % 36.9 37.4 34.2(L)  Platelets 150 - 440 K/uL 160 143(L) 125(L)    CXR: NNF Echocardiogram 11/21: LVEF 35-40%. Concentric LVH, severe AS  IMPRESSION: VDRF - Extubated 11/22. Appears to be tolerating  Angioedema resolved. Most likely due to contrast media Newly documented aortic stenosis and hypertrophic cardiomyopathy with impaired systolic function ESRD - HD MWF Prior CVA with R hemiplegia - appears to be exacerbated by acute illness Concern for new onset seizure - Keppra initiated 11/20. EEG  11/21 negative for epileptiform activity  Neurology recommends continuing Keppra - change to enteral 11/23 DM2 - adequate control Dysphagia due to prior CVA and acute illness   PLAN/REC: Cont supplemental O2 to maintain SpO2 > 90% Changed to SDU status 11/23 NTS PRN Cardiology evaluation requested 11/23 Cont NGT and TFs Monitor BMET intermittently Monitor I/Os Correct electrolytes as indicated HD schedule per renal service DVT px: SQ heparin Monitor CBC intermittently Transfuse per usual guidelines Keppra changed to enteral route 11/23   Billy Fischeravid Simonds, MD PCCM service Mobile [phone] Pager [phone]

## 2015-02-10 NOTE — Progress Notes (Signed)
Post Tx Vitals

## 2015-02-11 DIAGNOSIS — I43 Cardiomyopathy in diseases classified elsewhere: Secondary | ICD-10-CM | POA: Insufficient documentation

## 2015-02-11 DIAGNOSIS — I119 Hypertensive heart disease without heart failure: Secondary | ICD-10-CM | POA: Insufficient documentation

## 2015-02-11 DIAGNOSIS — I214 Non-ST elevation (NSTEMI) myocardial infarction: Secondary | ICD-10-CM

## 2015-02-11 DIAGNOSIS — I110 Hypertensive heart disease with heart failure: Secondary | ICD-10-CM

## 2015-02-11 DIAGNOSIS — I25111 Atherosclerotic heart disease of native coronary artery with angina pectoris with documented spasm: Secondary | ICD-10-CM

## 2015-02-11 DIAGNOSIS — I350 Nonrheumatic aortic (valve) stenosis: Secondary | ICD-10-CM | POA: Insufficient documentation

## 2015-02-11 LAB — TROPONIN I: TROPONIN I: 2.52 ng/mL — AB (ref <0.031)

## 2015-02-11 LAB — GLUCOSE, CAPILLARY
GLUCOSE-CAPILLARY: 152 mg/dL — AB (ref 65–99)
GLUCOSE-CAPILLARY: 176 mg/dL — AB (ref 65–99)
GLUCOSE-CAPILLARY: 182 mg/dL — AB (ref 65–99)
GLUCOSE-CAPILLARY: 194 mg/dL — AB (ref 65–99)
Glucose-Capillary: 150 mg/dL — ABNORMAL HIGH (ref 65–99)
Glucose-Capillary: 163 mg/dL — ABNORMAL HIGH (ref 65–99)
Glucose-Capillary: 170 mg/dL — ABNORMAL HIGH (ref 65–99)
Glucose-Capillary: 214 mg/dL — ABNORMAL HIGH (ref 65–99)

## 2015-02-11 MED ORDER — LEVETIRACETAM 100 MG/ML PO SOLN
500.0000 mg | Freq: Every day | ORAL | Status: DC
  Administered 2015-02-12 – 2015-02-23 (×11): 500 mg via ORAL
  Filled 2015-02-11 (×17): qty 5

## 2015-02-11 MED ORDER — LEVETIRACETAM 100 MG/ML PO SOLN
500.0000 mg | ORAL | Status: DC
  Administered 2015-02-12 – 2015-02-22 (×3): 500 mg via ORAL
  Filled 2015-02-11 (×6): qty 5

## 2015-02-11 NOTE — Progress Notes (Signed)
Patient: [name] / Admit Date: 02/04/2015 / Date of Encounter: 02/11/2015, 12:39 PM   Subjective: Patient is nonverbal, arousable No events overnight, Hemodialysis yesterday  Review of Systems: Review of Systems  Unable to perform ROS as patient is nonverbal  Objective: Telemetry: NSR Physical Exam: Blood pressure 130/72, pulse 78, temperature 98.2 F (36.8 C), temperature source Axillary, resp. rate 23, height 5\' 6"  (1.676 m), weight 131 lb 2.8 oz (59.5 kg), SpO2 95 %. Body mass index is 21.18 kg/(m^2). General: Well developed, well nourished, in no acute distress. Head: Normocephalic, atraumatic, sclera non-icteric Neck: Negative for carotid bruits. JVP elevated 10+ Lungs: Clear bilaterally to auscultation without wheezes, rales, or rhonchi. Breathing is unlabored. Heart: RRR S1 S2 with 3+ murmur RSB Abdomen: Soft, non-tender, non-distended with normoactive bowel sounds. No rebound/guarding. Extremities: No clubbing or cyanosis. No edema.  Neuro: lethargic, arousable, Moves all extremities spontaneously. Psych:  Responds to questions appropriately with a normal affect.   Intake/Output Summary (Last 24 hours) at 02/11/15 1239 Last data filed at 02/11/15 1000  Gross per 24 hour  Intake   1080 ml  Output      0 ml  Net   1080 ml    Inpatient Medications:  . antiseptic oral rinse  7 mL Mouth Rinse QID  . aspirin  81 mg Per Tube Daily  . atorvastatin  40 mg Per Tube q1800  . calcium acetate (Phos Binder)  1,334 mg Per Tube TID  . chlorhexidine gluconate  15 mL Mouth Rinse BID  . clopidogrel  75 mg Per Tube Daily  . free water  100 mL Per Tube 3 times per day  . heparin subcutaneous  5,000 Units Subcutaneous Q12H  . hydrALAZINE  50 mg Per Tube 3 times per day  . insulin aspart  0-15 Units Subcutaneous 6 times per day  . isosorbide dinitrate  10 mg Per Tube BID  . [START ON 02/12/2015] levETIRAcetam  500 mg Oral Daily   And  . [START ON 02/12/2015]  levETIRAcetam  500 mg Oral Q M,W,F-1800  . metoprolol tartrate  12.5 mg Per Tube BID  . senna-docusate  1 tablet Per Tube BID  . vitamin C  500 mg Per Tube BID   Infusions:  . feeding supplement (NEPRO CARB STEADY) 1,000 mL (02/10/15 1256)    Labs:  Recent Labs  02/09/15 0644 02/10/15 0658 02/10/15 2033  NA 135 135  --   K 4.3 4.3  --   CL 96* 94*  --   CO2 26 26  --   GLUCOSE 193* 112*  --   BUN 65* 86*  --   CREATININE 7.28* 8.62*  --   CALCIUM 8.1* 8.2*  --   MG  --   --  1.9  PHOS  --  4.5  --     Recent Labs  02/09/15 0644  AST 23  ALT 19  ALKPHOS 56  BILITOT 0.8  PROT 6.1*  ALBUMIN 2.9*    Recent Labs  02/09/15 0644 02/10/15 0658  WBC 6.7 7.3  HGB 12.1 11.7*  HCT 37.4 36.9  MCV 87.9 87.2  PLT 143* 160    Recent Labs  02/10/15 1423 02/10/15 1936 02/11/15 0145  TROPONINI 3.26* 3.17* 2.52*   Invalid input(s): POCBNP No results for input(s): HGBA1C in the last 72 hours.   Weights: Filed Weights   02/10/15 0820 02/10/15 1230 02/11/15 0500  Weight: 138 lb 3.7 oz (62.7 kg) 130 lb 1.1 oz (59  kg) 131 lb 2.8 oz (59.5 kg)     Radiology/Studies:    Dg Chest Port 1 View 02/17/2015   IMPRESSION: Tube positions as described without pneumothorax. Areas of mild atelectatic change bilaterally. No frank edema or consolidation. Heart borderline enlarged. Electronically Signed   By: Bretta Bang III M.D.   On: 17-Feb-2015 07:27     Assessment and Plan  69 y.o. female 69 year old woman with end-stage renal disease on HD on (Monday, Wednesday, Friday), HTN, hyperlipidemia, previously seen at [facility] with weakness, vomiting, with non-ST elevation MI, troponin to 21, managed medically, ejection fraction 40-50% at that time, moderate aortic valve stenosis, moderate pulmonary hypertension.  Lives in a nursing home, admitted after  fistulogram procedure, with swelling on her neck and face and altered mental status, some respiratory distress requiring being  placed on ventilator. No extubated. Cardiology following for severe aortic valve stenosis and cardiomyopathy, coronary artery disease, troponin elevation.   1. severe aortic stenosis: Recent echo this admission showing severe aortic valve stenosis Pressure gradient measured likely underestimates degree of stenosis given her depressed LV function --In her current state, not a good candidate for aortic valve surgery Options could be discussed as an outpatient if she has meaningful recovery, She would need catheterization as part of that workup  2. CAD: Moderately depressed ejection fraction concerning for underlying CAD No recent cardiac catheterization We will discuss goals of care with her daughter   3. Ischemic cardiomyopathy: -Volume managed by HD -Continue Lopressor 12.5 mg bid and Isordil   4. Severe pulmonary hypertension Needs dialysis, right heart pressures at least 80 mmHg on echo Likely exacerbated by renal failure and severe aortic valve stenosis  5. ESRD: -Per Renal With very high right-sided pressures on echocardiogram, may need several days of dialysis in a row  6. Elevated troponin:  in the acute setting of her acute respiratory failure  Underlying CAD as well most likely We'll need to discuss goals of care with her daughter, no plans for cardiac catheterization at this time Numbers are trending downward   Signed, Dossie Arbour, MD [facility] 02/11/2015, 12:39 PM

## 2015-02-11 NOTE — Progress Notes (Addendum)
I have reviewed pt's hospitalization in detail  RASS -1. Tolerating extubation but marginal cough and handling oral secretions poorly. Failed SLP eval. NGT replaced.Somnolent but easily awaken this morning.   Filed Vitals:   02/11/15 0500 02/11/15 0600 02/11/15 0700 02/11/15 0800  BP: 110/53 135/70 134/60 104/53  Pulse: 68 78 74 66  Temp: 99 F (37.2 C)   98.2 F (36.8 C)  TempSrc: Oral   Axillary  Resp: 18 13 24 20   Height:      Weight: 131 lb 2.8 oz (59.5 kg)     SpO2: 92% 97% 82% 90%   Chronically ill appearing, no distress RASS -1, + F/C R facial droop Minimal phonation Copious oral secretions No JVD seen Chest clear Reg rhythm, harsh systolic M radiating to neck NABS, soft, NT Ext warm, no edema Mild R sided weakness  CMP Latest Ref Rng 02/10/2015 02/09/2015 02/08/2015  Glucose 65 - 99 mg/dL 696(E112(H) 952(W193(H) 413(K249(H)  BUN 6 - 20 mg/dL 44(W86(H) 10(U65(H) 72(Z97(H)  Creatinine 0.44 - 1.00 mg/dL 3.66(Y8.62(H) 4.03(K7.28(H) 7.42(V9.43(H)  Sodium 135 - 145 mmol/L 135 135 130(L)  Potassium 3.5 - 5.1 mmol/L 4.3 4.3 5.0  Chloride 101 - 111 mmol/L 94(L) 96(L) 90(L)  CO2 22 - 32 mmol/L 26 26 23   Calcium 8.9 - 10.3 mg/dL 8.2(L) 8.1(L) 7.4(L)  Total Protein 6.5 - 8.1 g/dL - 6.1(L) -  Total Bilirubin 0.3 - 1.2 mg/dL - 0.8 -  Alkaline Phos 38 - 126 U/L - 56 -  AST 15 - 41 U/L - 23 -  ALT 14 - 54 U/L - 19 -    CBC Latest Ref Rng 02/10/2015 02/09/2015 02/08/2015  WBC 3.6 - 11.0 K/uL 7.3 6.7 4.4  Hemoglobin 12.0 - 16.0 g/dL 11.7(L) 12.1 11.4(L)  Hematocrit 35.0 - 47.0 % 36.9 37.4 34.2(L)  Platelets 150 - 440 K/uL 160 143(L) 125(L)    CXR: NNF Echocardiogram 11/21: LVEF 35-40%. Concentric LVH, severe AS  IMPRESSION: VDRF - Extubated 11/22. Appears to be tolerating  Angioedema resolved. Most likely due to contrast media Newly documented aortic stenosis and hypertrophic cardiomyopathy with impaired systolic function ESRD - HD MWF Prior CVA with R hemiplegia - appears to be exacerbated by acute  illness Concern for new onset seizure - Keppra initiated 11/20. EEG 11/21 negative for epileptiform activity  Neurology recommends continuing Keppra - change to enteral 11/23  May need to dec dose given depressed mentation with high secretions, will discuss with  neurology DM2 - adequate control Dysphagia due to prior CVA and acute illness   PLAN/REC: Cont supplemental O2 to maintain SpO2 > 90% Changed to SDU status 11/23 NTS PRN Cardiology evaluation requested 11/23 Cont NGT and TFs Monitor BMET intermittently Monitor I/Os Correct electrolytes as indicated HD schedule per renal service DVT px: SQ heparin Monitor CBC intermittently Transfuse per usual guidelines Keppra changed to enteral route 11/23  Keep SDU status in the ICU, given depressed mentation, high oral secretions, and high aspiration risk.   Addendum - spoke with neurology (Dr. Loman ChromanZeyilkman), recommended dec keppra to 500mg  daily, and 500mg  after dialysis.    Critical Care Time - 40 minutes  Stephanie AcreVishal Francesco Provencal, MD [facility] Pager [phone] (please enter [phone]-[phone]) On Call Pager - [phone] (please enter 7-digits)

## 2015-02-11 NOTE — Progress Notes (Signed)
[facility]  ROUNDING NOTE   Subjective:  Patient had hemodialysis yesterday. Tolerated well. Currently lethargic but is arousable.    Objective:  Vital signs in last 24 hours:  Temp:  [97.5 F (36.4 C)-99.2 F (37.3 C)] 99 F (37.2 C) (11/24 0500) Pulse Rate:  [66-90] 78 (11/24 0600) Resp:  [0-31] 13 (11/24 0600) BP: (93-158)/(53-86) 135/70 mmHg (11/24 0600) SpO2:  [84 %-100 %] 97 % (11/24 0600) FiO2 (%):  [28 %] 28 % (11/23 0909) Weight:  [59 kg (130 lb 1.1 oz)-62.7 kg (138 lb 3.7 oz)] 59.5 kg (131 lb 2.8 oz) (11/24 0500)  Weight change: 0 kg (0 lb) Filed Weights   02/10/15 0820 02/10/15 1230 02/11/15 0500  Weight: 62.7 kg (138 lb 3.7 oz) 59 kg (130 lb 1.1 oz) 59.5 kg (131 lb 2.8 oz)    Intake/Output: I/O last 3 completed shifts: In: 1965.7 [NG/GT:1860.7; IV Piggyback:105] Out: 1500 [Other:1500]   Intake/Output this shift:     Physical Exam: General:  no acute distress   Head: Normocephalic, tongue swelling significantly decreased   Eyes:/ENT Anicteric, moist membranes  Neck: Supple, trachea midline  Lungs:  Normal effort, clear ant/lat  Heart: S1S2 2/6 SEM  Abdomen:  Soft, nontender, BS present    Extremities:  trace peripheral edema. Soft restraints on hands  Neurologic: Lethargic but arousable, not following commands  Skin: No lesions  Access: LUE AVG    Basic Metabolic Panel:  Recent Labs Lab 02/06/15 0827 02/07/15 0211 02/08/15 0246 02/09/15 0644 02/10/15 0658 02/10/15 2033  NA 132* 133* 130* 135 135  --   K 5.1 4.7 5.0 4.3 4.3  --   CL 95* 94* 90* 96* 94*  --   CO2 21* --   GLUCOSE 212* 243* 249* 193* 112*  --   BUN 57* 74* 97* 65* 86*  --   CREATININE 7.66* 8.23* 9.43* 7.28* 8.62*  --   CALCIUM 8.4* 7.7* 7.4* 8.1* 8.2*  --   MG  --   --   --   --   --  1.9  PHOS 7.1*  6.9*  --   --   --  4.5  --     Liver Function Tests:  Recent Labs Lab 02/06/15 0827 02/09/15 0644  AST  --  23  ALT  --  19  ALKPHOS   --  56  BILITOT  --  0.8  PROT  --  6.1*  ALBUMIN 3.1* 2.9*   No results for input(s): LIPASE, AMYLASE in the last 168 hours. No results for input(s): AMMONIA in the last 168 hours.  CBC:  Recent Labs Lab 02/06/15 0827 02/07/15 0211 02/08/15 0246 02/09/15 0644 02/10/15 0658  WBC 5.3 4.0 4.4 6.7 7.3  HGB 13.7 11.7* 11.4* 12.1 11.7*  HCT 43.2 36.2 34.2* 37.4 36.9  MCV 86.8 87.2 86.9 87.9 87.2  PLT 127* 119* 125* 143* 160    Cardiac Enzymes:  Recent Labs Lab 02/10/15 1423 02/10/15 1936 02/11/15 0145  TROPONINI 3.26* 3.17* 2.52*    BNP: Invalid input(s): POCBNP  CBG:  Recent Labs Lab 02/10/15 1610 02/10/15 1948 02/11/15 0015 02/11/15 0414 02/11/15 0747  GLUCAP 150* 173* 182* 194* 176*    Microbiology: Results for orders placed or performed during the hospital encounter of 02/04/15  MRSA PCR Screening     Status: None   Collection Time: 02/04/15  2:05 PM  Result Value Ref Range Status   MRSA by PCR NEGATIVE NEGATIVE  Final    Comment:        The GeneXpert MRSA Assay (FDA approved for NASAL specimens only), is one component of a comprehensive MRSA colonization surveillance program. It is not intended to diagnose MRSA infection nor to guide or monitor treatment for MRSA infections.     Coagulation Studies: No results for input(s): LABPROT, INR in the last 72 hours.  Urinalysis: No results for input(s): COLORURINE, LABSPEC, PHURINE, GLUCOSEU, HGBUR, BILIRUBINUR, KETONESUR, PROTEINUR, UROBILINOGEN, NITRITE, LEUKOCYTESUR in the last 72 hours.  Invalid input(s): APPERANCEUR    Imaging: Dg Abd 1 View  02/10/2015  CLINICAL DATA:  Enteric feeding tube placement. EXAM: ABDOMEN - 1 VIEW COMPARISON:  02/04/1959 FINDINGS: The metal tip of the enteric feeding tube projects in the left mid abdomen, within the mid to distal stomach. Normal bowel gas pattern. Vascular calcifications are noted along the aorta and its branch vessels. IMPRESSION: 1. Enteric  feeding tube tip projects in the mid to distal stomach. Electronically Signed   By: Amie Portlandavid  Ormond M.D.   On: 02/10/2015 09:34     HEMODIALYSIS FLOWSHEET:  Blood Flow Rate (mL/min): 400 mL/min Arterial Pressure (mmHg): -180 mmHg Venous Pressure (mmHg): 170 mmHg Transmembrane Pressure (mmHg): 30 mmHg Ultrafiltration Rate (mL/min): 570 mL/min Dialysate Flow Rate (mL/min): 600 ml/min Conductivity: Machine : 14 Conductivity: Machine : 14 Dialysis Fluid Bolus: Normal Saline Bolus Amount (mL): 250 mL Intra-Hemodialysis Comments: 2000   Medications:   . feeding supplement (NEPRO CARB STEADY) 1,000 mL (02/10/15 1256)   . antiseptic oral rinse  7 mL Mouth Rinse QID  . aspirin  81 mg Per Tube Daily  . atorvastatin  40 mg Per Tube q1800  . calcium acetate (Phos Binder)  1,334 mg Per Tube TID  . chlorhexidine gluconate  15 mL Mouth Rinse BID  . clopidogrel  75 mg Per Tube Daily  . free water  100 mL Per Tube 3 times per day  . heparin subcutaneous  5,000 Units Subcutaneous Q12H  . hydrALAZINE  50 mg Per Tube 3 times per day  . insulin aspart  0-15 Units Subcutaneous 6 times per day  . isosorbide dinitrate  10 mg Per Tube BID  . levETIRAcetam  500 mg Oral Q24H   And  . levETIRAcetam  1,000 mg Oral Q M,W,F-1800   And  . levETIRAcetam  500 mg Oral Q T,Th,S,Su-1800  . metoprolol tartrate  12.5 mg Per Tube BID  . senna-docusate  1 tablet Per Tube BID  . vitamin C  500 mg Per Tube BID   acetaminophen **OR** acetaminophen, alteplase, fentaNYL (SUBLIMAZE) injection, heparin, lidocaine (PF), lidocaine-prilocaine, ondansetron, phenol  Assessment/ Plan:  69 y.o. female past medical history of end-stage renal disease, myocardial infarction, CVA, hyperlipidemia, hypertension, diabetes mellitus, depression, secondary hyperparathyroidism, dementia, GERD, anemia chronic kidney disease who presented for a fistulogram and ended up having acute respiratory failure and acute tongue  swelling/angioedema.  1.  End-stage renal disease on hemodialysis. [facility], [address] Patient had dialysis yesterday. Tolerated well. No acute indication for dialysis today. Next dialysis will be tomorrow.  2.  Acute respiratory failure/angioedema.  improved -Patient doing well off the ventilator at this point in time. Significant improvement in her tongue swelling since I last saw her.  3.  Anemia chronic kidney disease.  Hemoglobin 11.7 at last check. Continue to hold Epogen.  4.  Secondary hyperparathyroidism.  Phosphorus currently well controlled. Continue PhosLo.   LOS: 7 [name] 11/24/20167:57 AM

## 2015-02-11 NOTE — Progress Notes (Signed)
[facility] [place] [facility]   PATIENT NAME: [name]    MR#:  [id number]  DATE OF BIRTH:  1945/09/19  SUBJECTIVE:  CHIEF COMPLAINT:  No chief complaint on file.  Remains Extubated. Following some commands  REVIEW OF SYSTEMS:   ROS not answering many questions at this time  DRUG ALLERGIES:   Allergies  Allergen Reactions  . Contrast Media [Iodinated Diagnostic Agents] Anaphylaxis  . Oxygen Swelling and Rash    Capnography line oxygen tubing monitor    VITALS:  Blood pressure 130/72, pulse 78, temperature 98.7 F (37.1 C), temperature source Axillary, resp. rate 23, height  (1.676 m), weight 59.5 kg (131 lb 2.8 oz), SpO2 95 %.  PHYSICAL EXAMINATION:  GENERAL:  69 y.o.-year-old patient, no acute distress HEENT: Head atraumatic, normocephalic. Lips and tongue much less swollen, extubated, does have copious oral secretions which I think is chronic for her NECK:  Supple, no jugular venous distention. No thyroid enlargement, no tenderness.  LUNGS: Normal breath sounds bilaterally, no wheezing, rales,rhonchi or crepitation. No use of accessory muscles of respiration. Short shallow respirations CARDIOVASCULAR: S1, S2 normal. No murmurs, rubs, or gallops.  ABDOMEN: Soft, nontender, nondistended. Bowel sounds present. No organomegaly or mass.  EXTREMITIES: No pedal edema, cyanosis, or clubbing.  NEUROLOGIC: Follows simple commands, no focal defects noted  LABORATORY PANEL:   CBC  Recent Labs Lab 02/10/15 0658  WBC 7.3  HGB 11.7*  HCT 36.9  PLT 160   ------------------------------------------------------------------------------------------------------------------  Chemistries   Recent Labs Lab 02/09/15 0644 02/10/15 0658 02/10/15 2033  NA 135 135  --   K 4.3 4.3  --   CL 96* 94*  --   CO2 26 26  --   GLUCOSE 193* 112*  --   BUN 65* 86*  --   CREATININE 7.28* 8.62*  --   CALCIUM 8.1* 8.2*  --   MG  --   --  1.9  AST  23  --   --   ALT 19  --   --   ALKPHOS 56  --   --   BILITOT 0.8  --   --    ------------------------------------------------------------------------------------------------------------------  Cardiac Enzymes  Recent Labs Lab 02/11/15 0145  TROPONINI 2.52*   ------------------------------------------------------------------------------------------------------------------  RADIOLOGY:  Dg Abd 1 View  02/10/2015  CLINICAL DATA:  Enteric feeding tube placement. EXAM: ABDOMEN - 1 VIEW COMPARISON:  02/04/1959 FINDINGS: The metal tip of the enteric feeding tube projects in the left mid abdomen, within the mid to distal stomach. Normal bowel gas pattern. Vascular calcifications are noted along the aorta and its branch vessels. IMPRESSION: 1. Enteric feeding tube tip projects in the mid to distal stomach. Electronically Signed   By: Amie Portland M.D.   On: 02/10/2015 09:34    EKG:   Orders placed or performed during the hospital encounter of 02/04/15  . EKG 12-Lead  . EKG 12-Lead    ASSESSMENT AND PLAN:   * NSTEMI: troponins 3.26, Cardio already on board. Recent echo showing EF 35-40%,  Has severe AS which may need surgery if she comes out of current illness and family/patient wishes to go that route - certainly will need cath beforehand.  1 angioedema with respiratory compromise: Improved, extubated 11/22 - Pulmonology following, stable after extubation - Trigger or angioedema is unknown. I did review the photographs of the patient which were taken after her surgery showing welts over her face at the points of contact with the oxygen  tubing. She has had a similar event in the past. She will likely need to see an allergist after discharge.  2 altered mental status: Improving. Sluggish but following simple commands - neurology feels that AMS likely postictal - CT head no acute changes, old L left MCA infarct unchanged - Continue Keppra 500 mg twice a day as well as 500 mg after  dialysis  - At baseline patient lives with her daughter. Is mobile. Is alert and mostly oriented. Does have right sided weakness and some aphasia  3 ESRD on HD - Nephrology following. Continue hemodialysis per them  4  Hypertension - Blood pressure slightly low at this time. Continue metoprolol, isosorbide, hydralazine. Adjust as needed.  5  Coronary artery disease - Continue aspirin Plavix and statin  6. Nutrition: Cont NGT and TFs for now.   Keep SDU status in the ICU per PCCM recommendations - given depressed mentation, high oral secretions, and high aspiration risk     All the records are reviewed and case discussed with Care Management/Social Worker. Management plans discussed with the patient, family and they are in agreement.  CODE STATUS: Full   TOTAL TIME TAKING CARE OF THIS PATIENT: 25 minutes.   Greater than 50% of time spent in care coordination and counseling.   POSSIBLE D/C IN ? DAYS, DEPENDING ON CLINICAL CONDITION. Can be transferred to floors per PCCM recommendations. I'm ok with that.   [facility], Venita Seng M.D on 02/11/2015 at 1:23 PM  Between 7am to 6pm - Pager - [phone]  After 6pm go to [email or web address] - password [email or web address] [facility]  [facility]  Office  [phone]  CC: Primary care physician; Amy Diana EvesL Krebs, NP

## 2015-02-12 DIAGNOSIS — T783XXD Angioneurotic edema, subsequent encounter: Secondary | ICD-10-CM

## 2015-02-12 DIAGNOSIS — R404 Transient alteration of awareness: Secondary | ICD-10-CM

## 2015-02-12 DIAGNOSIS — R0600 Dyspnea, unspecified: Secondary | ICD-10-CM | POA: Insufficient documentation

## 2015-02-12 LAB — RENAL FUNCTION PANEL
Albumin: 2.7 g/dL — ABNORMAL LOW (ref 3.5–5.0)
Anion gap: 11 (ref 5–15)
BUN: 73 mg/dL — AB (ref 6–20)
CHLORIDE: 93 mmol/L — AB (ref 101–111)
CO2: 30 mmol/L (ref 22–32)
CREATININE: 7.56 mg/dL — AB (ref 0.44–1.00)
Calcium: 8.9 mg/dL (ref 8.9–10.3)
GFR calc Af Amer: 6 mL/min — ABNORMAL LOW (ref >60)
GFR calc non Af Amer: 5 mL/min — ABNORMAL LOW (ref >60)
Glucose, Bld: 168 mg/dL — ABNORMAL HIGH (ref 65–99)
Phosphorus: 3.6 mg/dL (ref 2.5–4.6)
Potassium: 4.7 mmol/L (ref 3.5–5.1)
Sodium: 134 mmol/L — ABNORMAL LOW (ref 135–145)

## 2015-02-12 LAB — CBC
HCT: 36.6 % (ref 35.0–47.0)
Hemoglobin: 12 g/dL (ref 12.0–16.0)
MCH: 28.4 pg (ref 26.0–34.0)
MCHC: 32.7 g/dL (ref 32.0–36.0)
MCV: 87.1 fL (ref 80.0–100.0)
PLATELETS: 187 10*3/uL (ref 150–440)
RBC: 4.21 MIL/uL (ref 3.80–5.20)
RDW: 22 % — AB (ref 11.5–14.5)
WBC: 5.6 10*3/uL (ref 3.6–11.0)

## 2015-02-12 LAB — GLUCOSE, CAPILLARY
GLUCOSE-CAPILLARY: 183 mg/dL — AB (ref 65–99)
GLUCOSE-CAPILLARY: 214 mg/dL — AB (ref 65–99)
Glucose-Capillary: 168 mg/dL — ABNORMAL HIGH (ref 65–99)
Glucose-Capillary: 209 mg/dL — ABNORMAL HIGH (ref 65–99)

## 2015-02-12 NOTE — Clinical Social Work Note (Addendum)
Clinical Social Work Assessment  Patient Details  Name: [name] MRN: [id number] Date of Birth: 10/23/1945  Date of referral:  02/12/15               Reason for consult:   (from SNF)                Permission sought to share information with:  Facility Medical sales representativeContact Representative, Family Supports Permission granted to share information::  Yes, Verbal Permission Granted (from guardian)  Name::      (guardian [name]  [phone]  Agency::     Relationship::     Contact Information:     Housing/Transportation Living arrangements for the past 2 months:  Skilled Nursing Facility, Single Family Home Source of Information:  Medical Team, Adult Children Patient Interpreter Needed:  None Criminal Activity/Legal Involvement Pertinent to Current Situation/Hospitalization:    Significant Relationships:  Adult Children Lives with:  Adult Children Do you feel safe going back to the place where you live?    Need for family participation in patient care:  Yes (Comment) (daughter)  Care giving concerns:  None at this time.   Social Worker assessment / plan:  Visual merchandiserClinical Social Worker (CSW) consult, patient was recently discharged to [facility] for STR.   Patient was not alert or  Oriented when CSW went into the room. (Information provided by phone from patient's daughter, which is also patient's guardian).  CSW introduced self and explained role of CSW department  Patient currently lives with her daughter, was recently discharged to [facility], however daughter does not plan for her to return at discharge.  Daughter would like a new bed search when patient is ready for discharge.   [facility], [address]   CSW will start FL2, (FL2 will need to be updated prior to faxing out)  in anticipation of patient returning to SNF for STR at discharge.       Employment status:  Disabled (Comment on whether or not currently receiving Disability) Insurance information:   Managed Medicare PT Recommendations:  Skilled Nursing Facility Information / Referral to community resources:  Skilled Nursing Facility  Patient/Family's Response to care:  Patient's daughter was appreciative of information provided by CSW.  Patient/Family's Understanding of and Emotional Response to Diagnosis, Current Treatment, and Prognosis:  Patient's daughter  understands that patient is under continued medical work up at this time.  Once medically stable she will discharge to SNF for STR.  Emotional Assessment Appearance:  Appears stated age Attitude/Demeanor/Rapport:  Unable to Assess Affect (typically observed):  Unable to Assess Orientation:   (unable to assess at this time) Alcohol / Substance use:  Never Used Psych involvement (Current and /or in the community):  No (Comment)  Discharge Needs  Concerns to be addressed:  Care Coordination, Discharge Planning Concerns Readmission within the last 30 days:  No Current discharge risk:  Dependent with Mobility, Chronically ill Barriers to Discharge:  Continued Medical Work up   Soundra PilonMoore, [name], [name] 02/12/2015, 1:51 PM

## 2015-02-12 NOTE — Progress Notes (Signed)
Patient: [name] / Admit Date: 02/04/2015 / Date of Encounter: 02/12/2015, 9:17 AM   Subjective: Patient is nonverbal.  No major events overnight.  More alert today, no family available  Review of Systems: Review of Systems  Unable to perform ROS: patient nonverbal    Objective: Telemetry: NSR, 60's Physical Exam: Blood pressure 116/59, pulse 68, temperature 97.4 F (36.3 C), temperature source Axillary, resp. rate 17, height  (1.676 m), weight 128 lb 8.5 oz (58.3 kg), SpO2 99 %. Body mass index is 20.75 kg/(m^2). General: Well developed, well nourished, in no acute distress. Head: Normocephalic, atraumatic, sclera non-icteric, no xanthomas, nares are without discharge. Neck: Negative for carotid bruits. JVP not elevated. Lungs: Clear bilaterally to auscultation without wheezes, rales, or rhonchi. Breathing is unlabored. Heart: RRR S1 S2 IV/VI harsh systolic murmurs heard throughout. No rubs, or gallops.  Abdomen: Soft, non-tender, non-distended with normoactive bowel sounds. No rebound/guarding. Extremities: No clubbing or cyanosis. No edema. Distal pedal pulses are 2+ and equal bilaterally. Neuro: Alert, Seems to move all extremities spontaneously. Psych:  Does not respond to questions   Intake/Output Summary (Last 24 hours) at 02/12/15 0917 Last data filed at 02/12/15 0600  Gross per 24 hour  Intake   1150 ml  Output      1 ml  Net   1149 ml    Inpatient Medications:  . antiseptic oral rinse  7 mL Mouth Rinse QID  . aspirin  81 mg Per Tube Daily  . atorvastatin  40 mg Per Tube q1800  . calcium acetate (Phos Binder)  1,334 mg Per Tube TID  . chlorhexidine gluconate  15 mL Mouth Rinse BID  . clopidogrel  75 mg Per Tube Daily  . free water  100 mL Per Tube 3 times per day  . heparin subcutaneous  5,000 Units Subcutaneous Q12H  . hydrALAZINE  50 mg Per Tube 3 times per day  . insulin aspart  0-15 Units Subcutaneous 6 times per day  . isosorbide dinitrate   10 mg Per Tube BID  . levETIRAcetam  500 mg Oral Daily   And  . levETIRAcetam  500 mg Oral Q M,W,F-1800  . metoprolol tartrate  12.5 mg Per Tube BID  . senna-docusate  1 tablet Per Tube BID  . vitamin C  500 mg Per Tube BID   Infusions:  . feeding supplement (NEPRO CARB STEADY) 1,000 mL (02/11/15 1827)    Labs:  Recent Labs  02/10/15 0658 02/10/15 2033  NA 135  --   K 4.3  --   CL 94*  --   CO2 26  --   GLUCOSE 112*  --   BUN 86*  --   CREATININE 8.62*  --   CALCIUM 8.2*  --   MG  --  1.9  PHOS 4.5  --    No results for input(s): AST, ALT, ALKPHOS, BILITOT, PROT, ALBUMIN in the last 72 hours.  Recent Labs  02/10/15 0658  WBC 7.3  HGB 11.7*  HCT 36.9  MCV 87.2  PLT 160    Recent Labs  02/10/15 1423 02/10/15 1936 02/11/15 0145  TROPONINI 3.26* 3.17* 2.52*   Invalid input(s): POCBNP No results for input(s): HGBA1C in the last 72 hours.   Weights: Filed Weights   02/10/15 1230 02/11/15 0500 02/12/15 0410  Weight: 130 lb 1.1 oz (59 kg) 131 lb 2.8 oz (59.5 kg) 128 lb 8.5 oz (58.3 kg)     Radiology/Studies:  Ct Head  Wo Contrast  02/06/2015   IMPRESSION: No acute intracranial process. 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 Electronically Signed   By: Awilda Metroourtnay  Bloomer M.D.   On: 02/06/2015 00:18   Dg Chest Port 1 View  02/09/2015   IMPRESSION: Tube positions as described without pneumothorax. Areas of mild atelectatic change bilaterally. No frank edema or consolidation. Heart borderline enlarged. Electronically Signed   By: Bretta BangWilliam  Woodruff III M.D.   On: 02/09/2015 07:27     Assessment and Plan  69 y.o. with CAD with history of NSTEMI in 04/2014 with peak troponin of 21 medically managed at that time with EF of 40-50%, ESRD on HD on (Monday, Wednesday, Friday), HTN, hyperlipidemia, moderate aortic valve stenosis, and  moderate pulmonary hypertension who lives in a nursing home, and was [facility]17 after fistulogram procedure, with swelling on her neck and face and altered mental status, some respiratory distress requiring being placed on ventilator, now extubated. Cardiology following for severe aortic valve stenosis and cardiomyopathy, coronary artery disease, troponin elevation.   1. Severe aortic stenosis: -Recent echo this admission showing severe aortic valve stenosis -Pressure gradient measured across valve on echo likely underestimates degree of stenosis given her depressed LV function -In her current state, not a good candidate for aortic valve surgery, even TAVR -Options could be discussed if she has meaningful recovery, more communicative/cognitive improvement -She would need right and left cardiac catheterization as part of that workup  2. CAD: -Moderately depressed ejection fraction concerning for underlying CAD -No recent cardiac catheterization -We will discuss goals of care with her daughter, no family available Does not appear to be having symptoms concerning for active ischemia  3. Ischemic cardiomyopathy: -Volume managed by HD -Continue Lopressor and Isordil   4. Severe pulmonary hypertension -Needs dialysis, right heart pressures at least 80 mmHg on echo -Likely exacerbated by renal failure and severe aortic valve stenosis  5. ESRD: -Per Renal -With very high right-sided pressures on echocardiogram, may need several days of dialysis in a row Will defer to renal, does not appear to be in distress  6. Elevated troponin: -In the acute setting of her acute respiratory failure  -Underlying CAD as well most likely -We'll need to discuss goals of care with her daughter, no plans for cardiac catheterization at this time    Valaria GoodSigned, Ryan Dunn, PA-C Pager: [phone] 02/12/2015, 9:17 AM   Attending Note Patient seen and examined, agree with detailed note above,   Patient presentation and plan discussed on rounds.   HD planned for today, labs today Relatively nonverbal,  No family available If no meaningful cognitive/communiction  improvement, she will likely not be a candidate for AVR or TAVR ---In that case, given severity of stenosis, would expect she will be high risk for syncope/angiuna/sudden death.  ---We may need to have a meeting with family to discuss code status  Signed: Dossie Arbourim Lowana Hable  M.D., Ph.D. [facility]

## 2015-02-12 NOTE — Consult Note (Addendum)
[name] is an 69 y.o. female.  HPI: 69 yo RHD F presents to [facility] for AV graft repair when she developed angioedema and was later intubated.  Pt was poorly responsive after this and there was concern for seizure vs. Another neurologic condition.   Mental status much improved.   Past Medical History  Diagnosis Date  . Stroke (HCC)   . MI (myocardial infarction) (HCC)   . Hearing impaired   . ESRD (end stage renal disease) (HCC)   . Hyperlipidemia   . Hypertension   . Diabetes mellitus without complication (HCC)   . Depression   . Coronary artery disease   . Dementia   . Secondary hyperparathyroidism (HCC)   . Dialysis patient [facility])     Monday, Wednesday and Friday.   . Chronic systolic CHF (congestive heart failure) (HCC)   . GERD (gastroesophageal reflux disease)   . Personal history of transient ischemic attack (TIA) and cerebral infarction without residual deficit   . Anemia   . Moderate aortic stenosis     a. echo 04/2014: 40-50%, moderate AS    Past Surgical History  Procedure Laterality Date  . Cataract extraction, bilateral    . Wrist surgery    . Peripheral vascular catheterization Left 07/28/2014    Procedure: A/V Shuntogram/Fistulagram;  Surgeon: Renford Dills, MD;  Location: [facility] INVASIVE CV LAB;  Service: Cardiovascular;  Laterality: Left;  . Peripheral vascular catheterization Left 07/28/2014    Procedure: A/V Shunt Intervention;  Surgeon: Renford Dills, MD;  Location: [facility] INVASIVE CV LAB;  Service: Cardiovascular;  Laterality: Left;  . Peripheral vascular catheterization Left 02/04/2015    Procedure: A/V Shuntogram/Fistulagram;  Surgeon: Annice Needy, MD;  Location: [facility] INVASIVE CV LAB;  Service: Cardiovascular;  Laterality: Left;  . Peripheral vascular catheterization Left 02/04/2015    Procedure: A/V Shunt Intervention;  Surgeon: Annice Needy, MD;  Location: [facility] INVASIVE CV LAB;  Service: Cardiovascular;  Laterality: Left;    Family History   Problem Relation Age of Onset  . CAD Father     Social History:  reports that she has never smoked. She does not have any smokeless tobacco history on file. She reports that she does not drink alcohol or use illicit drugs.  Allergies:  Allergies  Allergen Reactions  . Contrast Media [Iodinated Diagnostic Agents] Anaphylaxis  . Oxygen Swelling and Rash    Capnography line oxygen tubing monitor    Medications: personally reviewed by me and normal  Results for orders placed or performed during the hospital encounter of 02/04/15 (from the past 48 hour(s))  Glucose, capillary     Status: Abnormal   Collection Time: 02/10/15  4:10 PM  Result Value Ref Range   Glucose-Capillary 150 (H) 65 - 99 mg/dL  Troponin I (q 6hr x 3)     Status: Abnormal   Collection Time: 02/10/15  7:36 PM  Result Value Ref Range   Troponin I 3.17 (H) <0.031 ng/mL    Comment: READ BACK AND VERIFIED WITH  RENEE BABB AT 2015 02/10/15 SDR        POSSIBLE MYOCARDIAL ISCHEMIA. SERIAL TESTING RECOMMENDED.   Glucose, capillary     Status: Abnormal   Collection Time: 02/10/15  7:48 PM  Result Value Ref Range   Glucose-Capillary 173 (H) 65 - 99 mg/dL  Magnesium     Status: None   Collection Time: 02/10/15  8:33 PM  Result Value Ref Range   Magnesium 1.9 1.7 - 2.4 mg/dL  Glucose, capillary     Status: Abnormal   Collection Time: 02/11/15 12:15 AM  Result Value Ref Range   Glucose-Capillary 182 (H) 65 - 99 mg/dL  Troponin I (q 6hr x 3)     Status: Abnormal   Collection Time: 02/11/15  1:45 AM  Result Value Ref Range   Troponin I 2.52 (H) <0.031 ng/mL    Comment: RESULTS PREVIOUSLY CALLED BY SDR AT 2015 02/10/15 WDM        POSSIBLE MYOCARDIAL ISCHEMIA. SERIAL TESTING RECOMMENDED.   Glucose, capillary     Status: Abnormal   Collection Time: 02/11/15  4:14 AM  Result Value Ref Range   Glucose-Capillary 194 (H) 65 - 99 mg/dL  Glucose, capillary     Status: Abnormal   Collection Time: 02/11/15  7:47 AM   Result Value Ref Range   Glucose-Capillary 176 (H) 65 - 99 mg/dL  Glucose, capillary     Status: Abnormal   Collection Time: 02/11/15 11:56 AM  Result Value Ref Range   Glucose-Capillary 163 (H) 65 - 99 mg/dL  Glucose, capillary     Status: Abnormal   Collection Time: 02/11/15  1:21 PM  Result Value Ref Range   Glucose-Capillary 214 (H) 65 - 99 mg/dL  Glucose, capillary     Status: Abnormal   Collection Time: 02/11/15  4:01 PM  Result Value Ref Range   Glucose-Capillary 152 (H) 65 - 99 mg/dL  Glucose, capillary     Status: Abnormal   Collection Time: 02/11/15  7:53 PM  Result Value Ref Range   Glucose-Capillary 170 (H) 65 - 99 mg/dL  Glucose, capillary     Status: Abnormal   Collection Time: 02/11/15 11:42 PM  Result Value Ref Range   Glucose-Capillary 150 (H) 65 - 99 mg/dL  Glucose, capillary     Status: Abnormal   Collection Time: 02/12/15  3:57 AM  Result Value Ref Range   Glucose-Capillary 209 (H) 65 - 99 mg/dL  Glucose, capillary     Status: Abnormal   Collection Time: 02/12/15  7:56 AM  Result Value Ref Range   Glucose-Capillary 183 (H) 65 - 99 mg/dL  Glucose, capillary     Status: Abnormal   Collection Time: 02/12/15 12:33 PM  Result Value Ref Range   Glucose-Capillary 214 (H) 65 - 99 mg/dL    No results found.  Review of Systems  Unable to perform ROS: intubated   Blood pressure 141/70, pulse 64, temperature 98.3 F (36.8 C), temperature source Oral, resp. rate 17, height 5\' 6"  (1.676 m), weight 128 lb 8.5 oz (58.3 kg), SpO2 100 %. Physical Exam  Constitutional: She appears well-developed and well-nourished. No distress.  HENT:  Head: Normocephalic and atraumatic.  Right Ear: External ear normal.  Left Ear: External ear normal.  Nose: Nose normal.  Eyes: Conjunctivae and EOM are normal. Pupils are equal, round, and reactive to light.  Neck: Normal range of motion. Neck supple.  Cardiovascular: Normal rate, regular rhythm, normal heart sounds and intact  distal pulses.   Respiratory: Effort normal and breath sounds normal.  GI: Soft. Bowel sounds are normal.  Neurological: Tremors: extubated.   PERRLA, EOMI, + corneals, good cough R hemiparesis but able to move some, L localizes untestable cerebellar 1+/4 B, R babinski, l plantar down Grimaces to pain in all 4 ext  Skin: Skin is warm. She is not diaphoretic.   CT of head personally reviewed by me and shows moderate L MCA infarct that is old  Pt's  mental status improved follows commands Agree with con't of keppra of 500 daily with 500 post dialysis  I do suspect if there will be no seizure activity, pt can come of it within 30 days.   Pauletta Browns 02/12/2015, 2:42 PM

## 2015-02-12 NOTE — Progress Notes (Signed)
[facility]   PATIENT NAME: [name]    MR#:  [id number]  DATE OF BIRTH:  [birth year]  SUBJECTIVE:  CHIEF COMPLAINT:  No chief complaint on file.  doing better, but failed swallowing evaluation, stuffy NG tube had to be replaced    REVIEW OF SYSTEMS:   ROS not answering many questions at this time  DRUG ALLERGIES:   Allergies  Allergen Reactions  . Contrast Media [Iodinated Diagnostic Agents] Anaphylaxis  . Oxygen Swelling and Rash    Capnography line oxygen tubing monitor    VITALS:  Blood pressure 153/69, pulse 69, temperature 97.4 F (36.3 C), temperature source Axillary, resp. rate 17, height  (1.676 m), weight 58.3 kg (128 lb 8.5 oz), SpO2 99 %.  PHYSICAL EXAMINATION:  GENERAL:  69 y.o.-year-old patient, no acute distress HEENT: Head atraumatic, normocephalic. Lips and tongue much less swollen, extubated, does have copious oral secretions which I think is chronic for her NECK:  Supple, no jugular venous distention. No thyroid enlargement, no tenderness.  LUNGS: Normal breath sounds bilaterally, no wheezing, rales,rhonchi or crepitation. No use of accessory muscles of respiration. Short shallow respirations CARDIOVASCULAR: S1, S2 normal. No murmurs, rubs, or gallops.  ABDOMEN: Soft, nontender, nondistended. Bowel sounds present. No organomegaly or mass.  EXTREMITIES: No pedal edema, cyanosis, or clubbing.  NEUROLOGIC: Follows simple commands, no focal defects noted LABORATORY PANEL:   CBC  Recent Labs Lab 02/10/15 0658  WBC 7.3  HGB 11.7*  HCT 36.9  PLT 160   ------------------------------------------------------------------------------------------------------------------  Chemistries   Recent Labs Lab 02/09/15 0644 02/10/15 0658 02/10/15 2033  NA 135 135  --   K 4.3 4.3  --   CL 96* 94*  --   CO2 26 26  --   GLUCOSE 193* 112*  --   BUN 65* 86*  --   CREATININE 7.28* 8.62*  --   CALCIUM  8.1* 8.2*  --   MG  --   --  1.9  AST 23  --   --   ALT 19  --   --   ALKPHOS 56  --   --   BILITOT 0.8  --   --    ------------------------------------------------------------------------------------------------------------------  Cardiac Enzymes  Recent Labs Lab 02/11/15 0145  TROPONINI 2.52*   ------------------------------------------------------------------------------------------------------------------  RADIOLOGY:  No results found.  EKG:   Orders placed or performed during the hospital encounter of 02/04/15  . EKG 12-Lead  . EKG 12-Lead    ASSESSMENT AND PLAN:   * NSTEMI: troponins 3.26, Cardio already on board. Recent echo showing EF 35-40%,  Has severe AS which may need surgery if she comes out of current illness and family/patient wishes to go that route - certainly will need cath beforehand.  1 Angioedema with respiratory compromise: Improved, extubated 11/22 - Pulmonology following, stable after extubation - Trigger or angioedema is unknown. I did review the photographs of the patient which were taken after her surgery showing welts over her face at the points of contact with the oxygen tubing. She has had a similar event in the past. She will likely need to see an allergist after discharge.  2 altered mental status: Improving. Sluggish but following simple commands - neurology feels that AMS likely postictal - CT head no acute changes, old L left MCA infarct unchanged -  changing Keppra to reduce dose of 500 mg daily &  500 mg after dialysis  - At baseline patient lives with her  daughter. Is mobile. Is alert and mostly oriented. Does have right sided weakness and some aphasia  3 ESRD on HD - Nephrology following. Continue hemodialysis per them  4  Hypertension - Blood pressure slightly low at this time. Continue metoprolol, isosorbide, hydralazine. Adjust as needed.  5  Coronary artery disease - Continue aspirin Plavix and statin  6. Nutrition: Cont  NGT and TFs for now.   Can be transferred to the floor with off unit telemetry    All the records are reviewed and case discussed with Care Management/Social Worker. Management plans discussed with the nursing.  CODE STATUS: Full   TOTAL TIME TAKING CARE OF THIS PATIENT: 25 minutes.   Greater than 50% of time spent in care coordination and counseling.   POSSIBLE D/C .  Early next week, DEPENDING ON CLINICAL CONDITION.    [facility], Shelitha Magley M.D on 02/12/2015 at 12:55 PM  Between 7am to 6pm - Pager - [phone]  After 6pm go to [email or web address] - password [email or web address] [facility]  [facility]  Office  [phone]  CC: Primary care physician; Amy Diana EvesL Krebs, NP

## 2015-02-12 NOTE — Progress Notes (Signed)
I have reviewed pt's hospitalization in detail  SUBJECTIVE:  RASS -1. Tolerating extubation but marginal cough and handling oral secretions better today. Failed SLP eval. NGT replaced. More awake this morning, able to squeeze left hand, not verbalizing needs.   Filed Vitals:   02/12/15 0410 02/12/15 0500 02/12/15 0600 02/12/15 0700  BP:  146/67 131/61 116/59  Pulse:  82 66 68  Temp:      TempSrc:      Resp:      Height:      Weight: 128 lb 8.5 oz (58.3 kg)     SpO2:  93% 100% 99%   Chronically ill appearing, no distress RASS -1, + F/C R facial droop Minimal phonation Copious oral secretions No JVD seen Chest clear Reg rhythm, harsh systolic M radiating to neck NABS, soft, NT Ext warm, no edema Mild R sided weakness  CMP Latest Ref Rng 02/10/2015 02/09/2015 02/08/2015  Glucose 65 - 99 mg/dL 956(O112(H) 130(Q193(H) 657(Q249(H)  BUN 6 - 20 mg/dL 46(N86(H) 62(X65(H) 52(W97(H)  Creatinine 0.44 - 1.00 mg/dL 4.13(K8.62(H) 4.40(N7.28(H) 0.27(O9.43(H)  Sodium 135 - 145 mmol/L 135 135 130(L)  Potassium 3.5 - 5.1 mmol/L 4.3 4.3 5.0  Chloride 101 - 111 mmol/L 94(L) 96(L) 90(L)  CO2 22 - 32 mmol/L 26 26 23   Calcium 8.9 - 10.3 mg/dL 8.2(L) 8.1(L) 7.4(L)  Total Protein 6.5 - 8.1 g/dL - 6.1(L) -  Total Bilirubin 0.3 - 1.2 mg/dL - 0.8 -  Alkaline Phos 38 - 126 U/L - 56 -  AST 15 - 41 U/L - 23 -  ALT 14 - 54 U/L - 19 -    CBC Latest Ref Rng 02/10/2015 02/09/2015 02/08/2015  WBC 3.6 - 11.0 K/uL 7.3 6.7 4.4  Hemoglobin 12.0 - 16.0 g/dL 11.7(L) 12.1 11.4(L)  Hematocrit 35.0 - 47.0 % 36.9 37.4 34.2(L)  Platelets 150 - 440 K/uL 160 143(L) 125(L)    CXR: NNF Echocardiogram 11/21: LVEF 35-40%. Concentric LVH, severe AS  IMPRESSION: VDRF - Extubated 11/22. Appears to be tolerating  Angioedema resolved. Most likely due to contrast media Newly documented aortic stenosis and hypertrophic cardiomyopathy with impaired systolic function ESRD - HD MWF Prior CVA with R hemiplegia - appears to be exacerbated by acute illness Concern  for new onset seizure - Keppra initiated 11/20. EEG 11/21 negative for epileptiform activity  Neurology recommends continuing Keppra - change to enteral 11/23  May need to dec dose given depressed mentation with high secretions, will discuss with  neurology DM2 - adequate control Dysphagia due to prior CVA and acute illness   PLAN/REC: Cont supplemental O2 to maintain SpO2 > 88% Changed to SDU status 11/23, if after HD today has stable hemodynamics, can then transfer to medical floor NTS PRN Cardiology evaluation requested 11/23 Cont NGT and TFs Monitor BMET intermittently Monitor I/Os Correct electrolytes as indicated HD schedule per renal service DVT px: SQ heparin Monitor CBC intermittently Transfuse per usual guidelines Spoke with neurology (Dr. Loman ChromanZeyilkman) on 11/24, recommended decrease keppra to 500mg  daily, and 500mg  after dialysis.   If after HD today has stable hemodynamics, can then transfer to medical floor  Spoke with neurology (Dr. Loman ChromanZeyilkman), recommended dec keppra to 500mg  daily, and 500mg  after dialysis.    Critical Care Time - 40 minutes  Stephanie AcreVishal Konstantine Gervasi, MD [facility] Pager [phone] (please enter 7-digits) On Call Pager - [phone] (please enter 7-digits)

## 2015-02-12 NOTE — Progress Notes (Signed)
PT Cancellation Note  Patient Details Name: Alyson Ingleshilis Otter MRN: [id number] DOB: 05/05/1945   Cancelled Treatment:    Reason Eval/Treat Not Completed: Patient at procedure or test/unavailable  Pt out of room for dialysis, will try back at a later date when appropriate.  Loran SentersGalen Tayleigh Wetherell, PT, DPT [id number]  Malachi ProGalen R Laguana Desautel 02/12/2015, 5:28 PM

## 2015-02-12 NOTE — Progress Notes (Signed)
Nutrition Follow-up    INTERVENTION:   EN: recommend continuing current TF regimen via dobhoff tube   NUTRITION DIAGNOSIS:   Inadequate oral intake related to acute illness as evidenced by NPO status. Being addressed via TF   GOAL:   Patient will meet greater than or equal to 90% of their needs  MONITOR:    (Energy Intake, Anthropometrics, Electrolyte/Renal Profile, Glucose Profile, Pulmonary)  REASON FOR ASSESSMENT:   Ventilator, Consult Enteral/tube feeding initiation and management  ASSESSMENT:    Pt receiving HD today, pt is arousable but not following commands, not verbalizing  Diet Order:  Diet NPO time specified, SLP following  EN: tolerating Nepro at 40 ml/hr via dobhoff, no signs of TF intolerance  Skin:  Reviewed, no issues  Last BM:   +BM 11/24  Electrolyte and Renal Profile:  Recent Labs Lab 02/06/15 0827  02/08/15 0246 02/09/15 0644 02/10/15 0658 02/10/15 2033  BUN 57*  < > 97* 65* 86*  --   CREATININE 7.66*  < > 9.43* 7.28* 8.62*  --   NA 132*  < > 130* 135 135  --   K 5.1  < > 5.0 4.3 4.3  --   MG  --   --   --   --   --  1.9  PHOS 7.1*  6.9*  --   --   --  4.5  --   < > = values in this interval not displayed. Glucose Profile:  Recent Labs  02/12/15 0357 02/12/15 0756 02/12/15 1233  GLUCAP 209* 183* 214*   Nutritional Anemia Profile:  CBC Latest Ref Rng 02/10/2015 02/09/2015 02/08/2015  WBC 3.6 - 11.0 K/uL 7.3 6.7 4.4  Hemoglobin 12.0 - 16.0 g/dL 11.7(L) 12.1 11.4(L)  Hematocrit 35.0 - 47.0 % 36.9 37.4 34.2(L)  Platelets 150 - 440 K/uL 160 143(L) 125(L)    Protein Profile:  Recent Labs Lab 02/06/15 0827 02/09/15 0644  ALBUMIN 3.1* 2.9*   Meds: phoslyra, novolog, senokot  Height:   Ht Readings from Last 1 Encounters:  02/04/15 5\' 6"  (1.676 m)    Weight:   Wt Readings from Last 1 Encounters:  02/12/15 128 lb 8.5 oz (58.3 kg)   Filed Weights   02/10/15 1230 02/11/15 0500 02/12/15 0410  Weight: 130 lb 1.1 oz (59  kg) 131 lb 2.8 oz (59.5 kg) 128 lb 8.5 oz (58.3 kg)    BMI:  Body mass index is 20.75 kg/(m^2).  Estimated Nutritional Needs:   Kcal:  BEE 1126 kcals (IF 1.1-1.3, AF 1.2) [id number] kcals/d.   Protein:  (1.2-1.5 g/kg) 70-87 g/kg  Fluid:  1000ml + UOP  EDUCATION NEEDS:   No education needs identified at this time  HIGH Care Level  Romelle Starcherate Daimion Adamcik MS, RD, LDN [phone] Pager

## 2015-02-12 NOTE — NC FL2 (Signed)
[place] MEDICAID FL2 LEVEL OF CARE SCREENING TOOL     IDENTIFICATION  Patient Name: [name] Birthdate: Dec 13, 1945 Sex: female Admission Date (Current Location): 02/04/2015  [facility] [place] Number:  Air cabin crew )   Facility and Address:  [facility], [address] [place], [place] [address]      Provider Number: [id number]  Attending Physician Name and Address:  Delfino Lovett, MD  Relative Name and Phone Number:       Current Level of Care: Hospital Recommended Level of Care: Skilled Nursing Facility Prior Approval Number:    Date Approved/Denied:   PASRR Number:    Discharge Plan: SNF    Current Diagnoses: Patient Active Problem List   Diagnosis Date Noted  . Severe aortic valve stenosis   . 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 NSTEMI (non-ST elevated myocardial infarction) (HCC)   . Cardiomyopathy, hypertensive, benign   . Angioedema 02/05/2015  . Acute respiratory failure (HCC) 02/05/2015  . Altered mental status 02/04/2015  . Altered mental state 02/04/2015  . Seizures (HCC) 12/02/2014  . Hyperlipidemia 08/11/2014  . End stage renal disease on dialysis (HCC) 05/12/2014  . Chronic systolic CHF (congestive heart failure) (HCC) 05/12/2014  . Secondary hypertension, unspecified 05/12/2014  . Type 2 diabetes mellitus with other circulatory complications (HCC) 05/12/2014  . Non-STEMI (non-ST elevated myocardial infarction) (HCC) 05/12/2014  . Moderate aortic valve stenosis 05/12/2014    Orientation ACTIVITIES/SOCIAL BLADDER RESPIRATION         Indwelling catheter O2 (As needed)  BEHAVIORAL SYMPTOMS/MOOD NEUROLOGICAL BOWEL NUTRITION STATUS        NG/panda  PHYSICIAN VISITS COMMUNICATION OF NEEDS Height & Weight Skin    Non-Verbally  (167.6 cm) 128 lbs.            AMBULATORY STATUS RESPIRATION    Assist extensive O2 (As needed)       Personal Care Assistance Level of Assistance  Total care Bathing Assistance: Maximum assistance Feeding assistance: Limited assistance Dressing Assistance: Maximum assistance Total Care Assistance: Maximum assistance    Functional Limitations Info                SPECIAL CARE FACTORS FREQUENCY  PT (By licensed PT)                   Additional Factors Info  Allergies   Allergies Info:  (Contrast Media, Oxygen)           Current Medications (02/12/2015): Current Facility-Administered Medications  Medication Dose Route Frequency Provider Last Rate Last Dose  . acetaminophen (TYLENOL) tablet 325-650 mg  325-650 mg Oral Q4H PRN Annice Needy, MD       Or  . acetaminophen (TYLENOL) suppository 325-650 mg  325-650 mg Rectal Q4H PRN Annice Needy, MD      . alteplase (CATHFLO ACTIVASE) injection 2 mg  2 mg Intracatheter Once PRN Munsoor Lateef, MD      . antiseptic oral rinse solution (CORINZ)  7 mL Mouth Rinse QID Altamese Dilling, MD   7 mL at 02/12/15 1245  . aspirin chewable tablet 81 mg  81 mg Per Tube Daily Altamese Dilling, MD   81 mg at 02/12/15 0949  . atorvastatin (LIPITOR) tablet 40 mg  40 mg Per Tube q1800 Merwyn Katos, MD   40 mg at 02/11/15 1826  . calcium acetate (Phos Binder) (PHOSLYRA) 667 MG/5ML oral solution 1,334 mg  1,334 mg Per Tube TID Munsoor Lateef, MD   1,334 mg at 02/12/15 0958  . chlorhexidine gluconate (PERIDEX) 0.12 % solution 15 mL  15 mL Mouth Rinse BID Altamese DillingVaibhavkumar Vachhani, MD   15 mL at 02/12/15 0951  . clopidogrel (PLAVIX) tablet 75 mg  75 mg Per Tube Daily Merwyn Katosavid B Simonds, MD   75 mg at 02/12/15 0949  . feeding supplement (NEPRO CARB STEADY) liquid 1,000 mL  1,000 mL Oral Continuous Merwyn Katosavid B Simonds, MD 40 mL/hr at 02/11/15 1827 1,000 mL at 02/11/15 1827  . fentaNYL (SUBLIMAZE) injection 12.5 mcg  12.5 mcg Intravenous Q1H PRN Merwyn Katosavid B Simonds, MD      . free water 100 mL  100 mL Per Tube 3 times per day Erin FullingKurian Kasa, MD   100 mL  at 02/12/15 0511  . heparin injection 1,000 Units  1,000 Units Dialysis PRN Munsoor Lateef, MD      . heparin injection 5,000 Units  5,000 Units Subcutaneous Q12H Shane CrutchPradeep Ramachandran, MD   5,000 Units at 02/12/15 0949  . hydrALAZINE (APRESOLINE) tablet 50 mg  50 mg Per Tube 3 times per day Merwyn Katosavid B Simonds, MD   50 mg at 02/12/15 0510  . insulin aspart (novoLOG) injection 0-15 Units  0-15 Units Subcutaneous 6 times per day Wyatt Hasteavid K Hower, MD   5 Units at 02/12/15 1244  . isosorbide dinitrate (ISORDIL) tablet 10 mg  10 mg Per Tube BID Gale Journeyatherine P Walsh, MD   10 mg at 02/12/15 0950  . levETIRAcetam (KEPPRA) 100 MG/ML solution 500 mg  500 mg Oral Daily Vishal Mungal, MD   500 mg at 02/12/15 1052   And  . levETIRAcetam (KEPPRA) 100 MG/ML solution 500 mg  500 mg Oral Q M,W,F-1800 Vishal Mungal, MD      . lidocaine (PF) (XYLOCAINE) 1 % injection 5 mL  5 mL Intradermal PRN Munsoor Lateef, MD      . lidocaine-prilocaine (EMLA) cream 1 application  1 application Topical PRN Munsoor Lateef, MD      . metoprolol tartrate (LOPRESSOR) tablet 12.5 mg  12.5 mg Per Tube BID Merwyn Katosavid B Simonds, MD   12.5 mg at 02/12/15 0949  . ondansetron (ZOFRAN) injection 4 mg  4 mg Intravenous Q6H PRN Annice NeedyJason S Dew, MD      . phenol (CHLORASEPTIC) mouth spray 1 spray  1 spray Mouth/Throat PRN Annice NeedyJason S Dew, MD      . senna-docusate (Senokot-S) tablet 1 tablet  1 tablet Per Tube BID Merwyn Katosavid B Simonds, MD   1 tablet at 02/12/15 [phone]  . vitamin C (ASCORBIC ACID) tablet 500 mg  500 mg Per Tube BID Merwyn Katosavid B Simonds, MD   500 mg at 02/12/15 [phone]   Do not use this list as official medication orders. Please verify with discharge summary.  Relevant Imaging Results:  Relevant Lab Results:  Recent Labs    Additional Information  (SS: [id number]) M[facility] @ [facility], [address] [place], Konrad Doloreseborah H, [place]

## 2015-02-12 NOTE — Progress Notes (Signed)
[facility]  ROUNDING NOTE   Subjective:  Patient due for hemodialysis today. She is arousable but not following commands. Case discussed with pulmonary critical care today.   Objective:  Vital signs in last 24 hours:  Temp:  [97.4 F (36.3 C)-98.7 F (37.1 C)] 97.4 F (36.3 C) (11/25 0400) Pulse Rate:  [64-82] 68 (11/25 0700) Resp:  [16-25] 17 (11/24 2300) BP: (104-159)/(53-86) 116/59 mmHg (11/25 0700) SpO2:  [77 %-100 %] 99 % (11/25 0700) Weight:  [58.3 kg (128 lb 8.5 oz)] 58.3 kg (128 lb 8.5 oz) (11/25 0410)  Weight change: -4.4 kg (-9 lb 11.2 oz) Filed Weights   02/10/15 1230 02/11/15 0500 02/12/15 0410  Weight: 59 kg (130 lb 1.1 oz) 59.5 kg (131 lb 2.8 oz) 58.3 kg (128 lb 8.5 oz)    Intake/Output: I/O last 3 completed shifts: In: 1910 [NG/GT:1910] Out: 1 [Urine:1]   Intake/Output this shift:     Physical Exam: General: no acute distress   Head: Normocephalic, tongue swelling significantly decreased   Eyes:/ENT Anicteric, moist membranes  Neck: Supple, trachea midline  Lungs:  Normal effort, clear ant/lat  Heart: S1S2 2/6 SEM  Abdomen:  Soft, nontender, BS present    Extremities:  trace peripheral edema. Soft restraints on hands  Neuro: Resting comfortably in bed, not following commands  Skin: No lesions  Access: LUE AVG    Basic Metabolic Panel:  Recent Labs Lab 02/06/15 0827 02/07/15 0211 02/08/15 0246 02/09/15 0644 02/10/15 0658 02/10/15 2033  NA 132* 133* 130* 135 135  --   K 5.1 4.7 5.0 4.3 4.3  --   CL 95* 94* 90* 96* 94*  --   CO2 21* 25 23 26 26   --   GLUCOSE 212* 243* 249* 193* 112*  --   BUN 57* 74* 97* 65* 86*  --   CREATININE 7.66* 8.23* 9.43* 7.28* 8.62*  --   CALCIUM 8.4* 7.7* 7.4* 8.1* 8.2*  --   MG  --   --   --   --   --  1.9  PHOS 7.1*  6.9*  --   --   --  4.5  --     Liver Function Tests:  Recent Labs Lab 02/06/15 0827 02/09/15 0644  AST  --  23  ALT  --  19  ALKPHOS  --  56  BILITOT  --  0.8  PROT   --  6.1*  ALBUMIN 3.1* 2.9*   No results for input(s): LIPASE, AMYLASE in the last 168 hours. No results for input(s): AMMONIA in the last 168 hours.  CBC:  Recent Labs Lab 02/06/15 0827 02/07/15 0211 02/08/15 0246 02/09/15 0644 02/10/15 0658  WBC 5.3 4.0 4.4 6.7 7.3  HGB 13.7 11.7* 11.4* 12.1 11.7*  HCT 43.2 36.2 34.2* 37.4 36.9  MCV 86.8 87.2 86.9 87.9 87.2  PLT 127* 119* 125* 143* 160    Cardiac Enzymes:  Recent Labs Lab 02/10/15 1423 02/10/15 1936 02/11/15 0145  TROPONINI 3.26* 3.17* 2.52*    BNP: Invalid input(s): POCBNP  CBG:  Recent Labs Lab 02/11/15 1321 02/11/15 1601 02/11/15 1953 02/11/15 2342 02/12/15 0357  GLUCAP 214* 152* 170* 150* 209*    Microbiology: Results for orders placed or performed during the hospital encounter of 02/04/15  MRSA PCR Screening     Status: None   Collection Time: 02/04/15  2:05 PM  Result Value Ref Range Status   MRSA by PCR NEGATIVE NEGATIVE Final    Comment:  The GeneXpert MRSA Assay (FDA approved for NASAL specimens only), is one component of a comprehensive MRSA colonization surveillance program. It is not intended to diagnose MRSA infection nor to guide or monitor treatment for MRSA infections.     Coagulation Studies: No results for input(s): LABPROT, INR in the last 72 hours.  Urinalysis: No results for input(s): COLORURINE, LABSPEC, PHURINE, GLUCOSEU, HGBUR, BILIRUBINUR, KETONESUR, PROTEINUR, UROBILINOGEN, NITRITE, LEUKOCYTESUR in the last 72 hours.  Invalid input(s): APPERANCEUR    Imaging: Dg Abd 1 View  02/10/2015  CLINICAL DATA:  Enteric feeding tube placement. EXAM: ABDOMEN - 1 VIEW COMPARISON:  02/04/1959 FINDINGS: The metal tip of the enteric feeding tube projects in the left mid abdomen, within the mid to distal stomach. Normal bowel gas pattern. Vascular calcifications are noted along the aorta and its branch vessels. IMPRESSION: 1. Enteric feeding tube tip projects in the mid to  distal stomach. Electronically Signed   By: Amie Portland M.D.   On: 02/10/2015 09:34     HEMODIALYSIS FLOWSHEET:  Blood Flow Rate (mL/min): 400 mL/min Arterial Pressure (mmHg): -180 mmHg Venous Pressure (mmHg): 170 mmHg Transmembrane Pressure (mmHg): 30 mmHg Ultrafiltration Rate (mL/min): 570 mL/min Dialysate Flow Rate (mL/min): 600 ml/min Conductivity: Machine : 14 Conductivity: Machine : 14 Dialysis Fluid Bolus: Normal Saline Bolus Amount (mL): 250 mL Intra-Hemodialysis Comments: 2000   Medications:   . feeding supplement (NEPRO CARB STEADY) 1,000 mL (02/11/15 1827)   . antiseptic oral rinse  7 mL Mouth Rinse QID  . aspirin  81 mg Per Tube Daily  . atorvastatin  40 mg Per Tube q1800  . calcium acetate (Phos Binder)  1,334 mg Per Tube TID  . chlorhexidine gluconate  15 mL Mouth Rinse BID  . clopidogrel  75 mg Per Tube Daily  . free water  100 mL Per Tube 3 times per day  . heparin subcutaneous  5,000 Units Subcutaneous Q12H  . hydrALAZINE  50 mg Per Tube 3 times per day  . insulin aspart  0-15 Units Subcutaneous 6 times per day  . isosorbide dinitrate  10 mg Per Tube BID  . levETIRAcetam  500 mg Oral Daily   And  . levETIRAcetam  500 mg Oral Q M,W,F-1800  . metoprolol tartrate  12.5 mg Per Tube BID  . senna-docusate  1 tablet Per Tube BID  . vitamin C  500 mg Per Tube BID   acetaminophen **OR** acetaminophen, alteplase, fentaNYL (SUBLIMAZE) injection, heparin, lidocaine (PF), lidocaine-prilocaine, ondansetron, phenol  Assessment/ Plan:  69 y.o. female past medical history of end-stage renal disease, myocardial infarction, CVA, hyperlipidemia, hypertension, diabetes mellitus, depression, secondary hyperparathyroidism, dementia, GERD, anemia chronic kidney disease who presented for a fistulogram and ended up having acute respiratory failure and acute tongue swelling/angioedema.  1.  End-stage renal disease on hemodialysis. M-W-F. @ [facility], [address] Patient due for hemodialysis today. We have prepared orders.   2.  Acute respiratory failure/angioedema.  improved -Angioedema has significantly improved. Patient breathing comfortably at the moment. Continue to monitor.   3.  Anemia chronic kidney disease.  Epogen on hold. Most recent hemoglobin was 11.7. Would monitor CBC periodically.  4.  Secondary hyperparathyroidism.  phosphorus was 4.5 at last check. Patient to be maintained on PhosLo at this point in time.    LOS: 8 Anden Bartolo 11/25/20167:46 AM

## 2015-02-13 ENCOUNTER — Inpatient Hospital Stay: Payer: Medicare Other

## 2015-02-13 DIAGNOSIS — J9690 Respiratory failure, unspecified, unspecified whether with hypoxia or hypercapnia: Secondary | ICD-10-CM | POA: Insufficient documentation

## 2015-02-13 DIAGNOSIS — J9621 Acute and chronic respiratory failure with hypoxia: Secondary | ICD-10-CM

## 2015-02-13 LAB — BASIC METABOLIC PANEL
Anion gap: 9 (ref 5–15)
BUN: 45 mg/dL — AB (ref 6–20)
CALCIUM: 8.7 mg/dL — AB (ref 8.9–10.3)
CHLORIDE: 95 mmol/L — AB (ref 101–111)
CO2: 32 mmol/L (ref 22–32)
CREATININE: 5.5 mg/dL — AB (ref 0.44–1.00)
GFR calc non Af Amer: 7 mL/min — ABNORMAL LOW (ref >60)
GFR, EST AFRICAN AMERICAN: 8 mL/min — AB (ref >60)
Glucose, Bld: 162 mg/dL — ABNORMAL HIGH (ref 65–99)
Potassium: 4.3 mmol/L (ref 3.5–5.1)
Sodium: 136 mmol/L (ref 135–145)

## 2015-02-13 LAB — GLUCOSE, CAPILLARY
GLUCOSE-CAPILLARY: 161 mg/dL — AB (ref 65–99)
GLUCOSE-CAPILLARY: 196 mg/dL — AB (ref 65–99)
Glucose-Capillary: 205 mg/dL — ABNORMAL HIGH (ref 65–99)
Glucose-Capillary: 166 mg/dL — ABNORMAL HIGH (ref 65–99)
Glucose-Capillary: 120 mg/dL — ABNORMAL HIGH (ref 65–99)
Glucose-Capillary: 177 mg/dL — ABNORMAL HIGH (ref 65–99)

## 2015-02-13 LAB — CBC
HEMATOCRIT: 37.4 % (ref 35.0–47.0)
HEMOGLOBIN: 12.1 g/dL (ref 12.0–16.0)
MCH: 28.2 pg (ref 26.0–34.0)
MCHC: 32.3 g/dL (ref 32.0–36.0)
MCV: 87.4 fL (ref 80.0–100.0)
Platelets: 178 10*3/uL (ref 150–440)
RBC: 4.28 MIL/uL (ref 3.80–5.20)
RDW: 21.8 % — AB (ref 11.5–14.5)
WBC: 4.2 10*3/uL (ref 3.6–11.0)

## 2015-02-13 MED ORDER — INFLUENZA VAC SPLIT QUAD 0.5 ML IM SUSY
0.5000 mL | PREFILLED_SYRINGE | INTRAMUSCULAR | Status: DC
  Filled 2015-02-13: qty 0.5

## 2015-02-13 NOTE — Progress Notes (Signed)
I have reviewed pt's hospitalization in detail  SUBJECTIVE:  RASS -1. Early on the medical floor, still with moderate amount of secretions, which are clear. More alert but not verbalizing anyone so needs. Seen and evaluated by neurology  Filed Vitals:   02/12/15 2050 02/13/15 0020 02/13/15 0423 02/13/15 0526  BP: 157/60 134/74 115/62 117/58  Pulse:  77 80 81  Temp: 98.3 F (36.8 C)  98.6 F (37 C)   TempSrc: Oral  Oral   Resp: 16  20   Height:      Weight:   139 lb 15.9 oz (63.5 kg)   SpO2: 97%  99%    Chronically ill appearing, no distress RASS -1, + F/C R facial droop Minimal phonation Copious oral secretions-clear appearance No JVD seen Chest clear Reg rhythm, harsh systolic M radiating to neck NABS, soft, NT Ext warm, no edema Mild R sided weakness  CMP Latest Ref Rng 02/13/2015 02/12/2015 02/10/2015  Glucose 65 - 99 mg/dL 098(J162(H) 191(Y168(H) 782(N112(H)  BUN 6 - 20 mg/dL 56(O45(H) 13(Y73(H) 86(V86(H)  Creatinine 0.44 - 1.00 mg/dL 7.84(O5.50(H) 9.62(X7.56(H) 5.28(U8.62(H)  Sodium 135 - 145 mmol/L 136 134(L) 135  Potassium 3.5 - 5.1 mmol/L 4.3 4.7 4.3  Chloride 101 - 111 mmol/L 95(L) 93(L) 94(L)  CO2 22 - 32 mmol/L 32 30 26  Calcium 8.9 - 10.3 mg/dL 1.3(K8.7(L) 8.9 4.4(W8.2(L)  Total Protein 6.5 - 8.1 g/dL - - -  Total Bilirubin 0.3 - 1.2 mg/dL - - -  Alkaline Phos 38 - 126 U/L - - -  AST 15 - 41 U/L - - -  ALT 14 - 54 U/L - - -    CBC Latest Ref Rng 02/13/2015 02/12/2015 02/10/2015  WBC 3.6 - 11.0 K/uL 4.2 5.6 7.3  Hemoglobin 12.0 - 16.0 g/dL 10.212.1 72.512.0 11.7(L)  Hematocrit 35.0 - 47.0 % 37.4 36.6 36.9  Platelets 150 - 440 K/uL 178 187 160    CXR: NNF Echocardiogram 11/21: LVEF 35-40%. Concentric LVH, severe AS  IMPRESSION: VDRF - Extubated 11/22. Appears to be tolerating  Angioedema resolved. Most likely due to contrast media Newly documented aortic stenosis and hypertrophic cardiomyopathy with impaired systolic function ESRD - HD MWF Prior CVA with R hemiplegia - appears to be exacerbated by acute  illness Concern for new onset seizure - Keppra initiated 11/20. EEG 11/21 negative for epileptiform activity  Neurology recommends continuing Keppra - change to enteral 11/23  DM2 - adequate control Dysphagia due to prior CVA and acute illness Severe Aortic Stenosis   PLAN/REC: Cont supplemental O2 to maintain SpO2 > 88% Would not add any anticholinergics or scopolamine for secretions-this may cause excessive drying of secretions making it more difficult to clear NTS PRN Cardiology evaluation requested 11/23 Cont NGT and TFs Monitor BMET intermittently Monitor I/Os Correct electrolytes as indicated HD schedule per renal service DVT px: SQ heparin Monitor CBC intermittently Transfuse per usual guidelines Per neurology (Dr. Loman ChromanZeyilkman) on 11/24, recommended decrease keppra to 500mg  daily, and 500mg  after dialysis.   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  Recommend Hospice care and possible palliative care consult for goals of care to be discussed with the patient family.  Thank you for consulting [facility], we will signoff at this time.  Please feel free to contacts [place] with any questions.   Pulmonary consult time - 35 minutes  Stephanie Acre, MD [facility] Pager [phone] (please enter 7-digits) On Call Pager - [phone] (please enter 7-digits)

## 2015-02-13 NOTE — Progress Notes (Signed)
Pt noted to have nonproductive cough. Dr. Sherryll BurgerShah paged and spoken to on phone. Dr. Sherryll BurgerShah informed primary RN that cough is not an new onset and that pt had cough while in ICU. Pt repositioned in bed sitting more upright. Primary nurse to continue to monitor

## 2015-02-13 NOTE — Progress Notes (Signed)
[facility]  ROUNDING NOTE   Subjective:  Patient seen at bedside. She is arousable and will follow commands but not conversive. Due for hemodialysis today. Remains on tube feeds.   Objective:  Vital signs in last 24 hours:  Temp:  [97.6 F (36.4 C)-98.6 F (37 C)] 98.6 F (37 C) (11/26 0423) Pulse Rate:  [64-82] 81 (11/26 0526) Resp:  [0-36] 20 (11/26 0423) BP: (115-163)/(58-74) 117/58 mmHg (11/26 0526) SpO2:  [96 %-100 %] 99 % (11/26 0423) Weight:  [60.1 kg (132 lb 7.9 oz)-63.5 kg (139 lb 15.9 oz)] 63.5 kg (139 lb 15.9 oz) (11/26 0423)  Weight change: 3.1 kg (6 lb 13.4 oz) Filed Weights   02/12/15 1700 02/12/15 2005 02/13/15 0423  Weight: 61.4 kg (135 lb 5.8 oz) 60.1 kg (132 lb 7.9 oz) 63.5 kg (139 lb 15.9 oz)    Intake/Output: I/O last 3 completed shifts: In: 2232 [NG/GT:2232] Out: 1001 [Urine:1; Other:1000]   Intake/Output this shift:     Physical Exam: General: no acute distress   Head: Normocephalic, NG in place  Eyes:/ENT Anicteric, moist membranes  Neck: Supple, trachea midline  Lungs:  Normal effort, clear ant/lat  Heart: S1S2 2/6 SEM  Abdomen:  Soft, nontender, BS present    Extremities:  trace peripheral edema. Soft restraints on hands  Neuro: Resting comfortably in bed, not following commands  Skin: No lesions  Access: LUE AVG    Basic Metabolic Panel:  Recent Labs Lab 02/08/15 0246 02/09/15 0644 02/10/15 0658 02/10/15 2033 02/12/15 1707 02/13/15 0553  NA 130* 135 135  --  134* 136  K 5.0 4.3 4.3  --  4.7 4.3  CL 90* 96* 94*  --  93* 95*  CO2 23 26 26   --  30 32  GLUCOSE 249* 193* 112*  --  168* 162*  BUN 97* 65* 86*  --  73* 45*  CREATININE 9.43* 7.28* 8.62*  --  7.56* 5.50*  CALCIUM 7.4* 8.1* 8.2*  --  8.9 8.7*  MG  --   --   --  1.9  --   --   PHOS  --   --  4.5  --  3.6  --     Liver Function Tests:  Recent Labs Lab 02/09/15 0644 02/12/15 1707  AST 23  --   ALT 19  --   ALKPHOS 56  --   BILITOT 0.8  --    PROT 6.1*  --   ALBUMIN 2.9* 2.7*   No results for input(s): LIPASE, AMYLASE in the last 168 hours. No results for input(s): AMMONIA in the last 168 hours.  CBC:  Recent Labs Lab 02/08/15 0246 02/09/15 0644 02/10/15 0658 02/12/15 1707 02/13/15 0553  WBC 4.4 6.7 7.3 5.6 4.2  HGB 11.4* 12.1 11.7* 12.0 12.1  HCT 34.2* 37.4 36.9 36.6 37.4  MCV 86.9 87.9 87.2 87.1 87.4  PLT 125* 143* 160 187 178    Cardiac Enzymes:  Recent Labs Lab 02/10/15 1423 02/10/15 1936 02/11/15 0145  TROPONINI 3.26* 3.17* 2.52*    BNP: Invalid input(s): POCBNP  CBG:  Recent Labs Lab 02/12/15 1233 02/12/15 2352 02/13/15 0346 02/13/15 0809 02/13/15 1138  GLUCAP 214* 168* 205* 196* 166*    Microbiology: Results for orders placed or performed during the hospital encounter of 02/04/15  MRSA PCR Screening     Status: None   Collection Time: 02/04/15  2:05 PM  Result Value Ref Range Status   MRSA by PCR NEGATIVE NEGATIVE Final  Comment:        The GeneXpert MRSA Assay (FDA approved for NASAL specimens only), is one component of a comprehensive MRSA colonization surveillance program. It is not intended to diagnose MRSA infection nor to guide or monitor treatment for MRSA infections.     Coagulation Studies: No results for input(s): LABPROT, INR in the last 72 hours.  Urinalysis: No results for input(s): COLORURINE, LABSPEC, PHURINE, GLUCOSEU, HGBUR, BILIRUBINUR, KETONESUR, PROTEINUR, UROBILINOGEN, NITRITE, LEUKOCYTESUR in the last 72 hours.  Invalid input(s): APPERANCEUR    Imaging: No results found.    Medications:   . feeding supplement (NEPRO CARB STEADY) 1,000 mL (02/12/15 2100)   . antiseptic oral rinse  7 mL Mouth Rinse QID  . aspirin  81 mg Per Tube Daily  . atorvastatin  40 mg Per Tube q1800  . calcium acetate (Phos Binder)  1,334 mg Per Tube TID  . chlorhexidine gluconate  15 mL Mouth Rinse BID  . clopidogrel  75 mg Per Tube Daily  . free water  100 mL  Per Tube 3 times per day  . heparin subcutaneous  5,000 Units Subcutaneous Q12H  . hydrALAZINE  50 mg Per Tube 3 times per day  . [START ON 02/14/2015] Influenza vac split quadrivalent PF  0.5 mL Intramuscular Tomorrow-1000  . insulin aspart  0-15 Units Subcutaneous 6 times per day  . isosorbide dinitrate  10 mg Per Tube BID  . levETIRAcetam  500 mg Oral Daily   And  . levETIRAcetam  500 mg Oral Q M,W,F-1800  . metoprolol tartrate  12.5 mg Per Tube BID  . senna-docusate  1 tablet Per Tube BID  . vitamin C  500 mg Per Tube BID   acetaminophen **OR** acetaminophen, alteplase, fentaNYL (SUBLIMAZE) injection, heparin, lidocaine (PF), lidocaine-prilocaine, ondansetron, phenol  Assessment/ Plan:  69 y.o. female past medical history of end-stage renal disease, myocardial infarction, CVA, hyperlipidemia, hypertension, diabetes mellitus, depression, secondary hyperparathyroidism, dementia, GERD, anemia chronic kidney disease who presented for a fistulogram and ended up having acute respiratory failure and acute tongue swelling/angioedema.  1.  End-stage renal disease on hemodialysis. M-W-F. @ [facility], [address] Pt had HD yesterday, no acute indication for HD today, will plan for HD again on Monday.   2.  Acute respiratory failure/angioedema.  improved -seems resolved, transitioned to floor care, breathing comfortably.   3.  Anemia chronic kidney disease.  Continue to hold epogen.   4.  Secondary hyperparathyroidism.  Continue phoslyra for phosphorus control.  LOS: 9 Adam Demary 11/26/201611:55 AM

## 2015-02-13 NOTE — Progress Notes (Signed)
.    Patient: [name] / Admit Date: 02/04/2015 / Date of Encounter: 02/13/2015, 1:06 PM   Subjective: Patient is nonverbal.  No major events overnight.   alert today, no family available Looking to one side, will not turn head to communicate Does not seem to acknowledge that I am in the room talking with her She does have cough, followed by pulmonary for secretions, upper respiratory infection  Review of Systems: Review of Systems  Unable to perform ROS: patient nonverbal    Objective: Telemetry: NSR Physical Exam: Blood pressure 128/62, pulse 83, temperature 99 F (37.2 C), temperature source Oral, resp. rate 16, height 5\' 6"  (1.676 m), weight 139 lb 15.9 oz (63.5 kg), SpO2 100 %. Body mass index is 22.61 kg/(m^2). General: Well developed, well nourished, in no acute distress. NG tube feeds in place Head: Normocephalic, atraumatic, sclera non-icteric, no xanthomas, nares are without discharge. Neck: Negative for carotid bruits. JVP not elevated. Lungs: Anteriorly and lateral with rails, Breathing is unlabored. Heart: RRR S1 S2 IV/VI harsh systolic murmurs heard throughout. No rubs, or gallops.  Abdomen: Soft, non-tender, non-distended with normoactive bowel sounds. No rebound/guarding. Extremities: No clubbing or cyanosis. No edema. Distal pedal pulses are 2+ and equal bilaterally. Neuro: Alert, would not move extremities today  Psych:  Does not respond to questions   Intake/Output Summary (Last 24 hours) at 02/13/15 1306 Last data filed at 02/13/15 0700  Gross per 24 hour  Intake   1092 ml  Output   1000 ml  Net     92 ml    Inpatient Medications:  . antiseptic oral rinse  7 mL Mouth Rinse QID  . aspirin  81 mg Per Tube Daily  . atorvastatin  40 mg Per Tube q1800  . calcium acetate (Phos Binder)  1,334 mg Per Tube TID  . chlorhexidine gluconate  15 mL Mouth Rinse BID  . clopidogrel  75 mg Per Tube Daily  . free water  100 mL Per Tube 3 times per day  .  heparin subcutaneous  5,000 Units Subcutaneous Q12H  . hydrALAZINE  50 mg Per Tube 3 times per day  . [START ON 02/14/2015] Influenza vac split quadrivalent PF  0.5 mL Intramuscular Tomorrow-1000  . insulin aspart  0-15 Units Subcutaneous 6 times per day  . isosorbide dinitrate  10 mg Per Tube BID  . levETIRAcetam  500 mg Oral Daily   And  . levETIRAcetam  500 mg Oral Q M,W,F-1800  . metoprolol tartrate  12.5 mg Per Tube BID  . senna-docusate  1 tablet Per Tube BID  . vitamin C  500 mg Per Tube BID   Infusions:  . feeding supplement (NEPRO CARB STEADY) 1,000 mL (02/12/15 2100)    Labs:  Recent Labs  02/10/15 2033 02/12/15 1707 02/13/15 0553  NA  --  134* 136  K  --  4.7 4.3  CL  --  93* 95*  CO2  --  30 32  GLUCOSE  --  168* 162*  BUN  --  73* 45*  CREATININE  --  7.56* 5.50*  CALCIUM  --  8.9 8.7*  MG 1.9  --   --   PHOS  --  3.6  --     Recent Labs  02/12/15 1707  ALBUMIN 2.7*    Recent Labs  02/12/15 1707 02/13/15 0553  WBC 5.6 4.2  HGB 12.0 12.1  HCT 36.6 37.4  MCV 87.1 87.4  PLT 187 178  Recent Labs  02/10/15 1423 02/10/15 1936 02/11/15 0145  TROPONINI 3.26* 3.17* 2.52*   Invalid input(s): POCBNP No results for input(s): HGBA1C in the last 72 hours.   Weights: Filed Weights   02/12/15 1700 02/12/15 2005 02/13/15 0423  Weight: 135 lb 5.8 oz (61.4 kg) 132 lb 7.9 oz (60.1 kg) 139 lb 15.9 oz (63.5 kg)     Radiology/Studies:  Ct Head Wo Contrast  02/06/2015   IMPRESSION: No acute intracranial process. 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 Electronically Signed   By: Awilda Metro M.D.   On: 02/06/2015 00:18   Dg Chest Port 1 View  02/09/2015   IMPRESSION: Tube positions as described without pneumothorax. Areas of mild atelectatic change bilaterally. No frank edema or consolidation. Heart borderline  enlarged. Electronically Signed   By: Bretta Bang III M.D.   On: 02/09/2015 07:27     Assessment and Plan  69 y.o. with CAD with history of NSTEMI in 04/2014 with peak troponin of 21 medically managed at that time with EF of 40-50%, ESRD on HD on (Monday, Wednesday, Friday), HTN, hyperlipidemia, moderate aortic valve stenosis, and moderate pulmonary hypertension who lives in a nursing home, and was admitted to [facility] on [facility]/17 after fistulogram procedure, with swelling on her neck and face and altered mental status, some respiratory distress requiring being placed on ventilator, now extubated. Cardiology following for severe aortic valve stenosis and cardiomyopathy, coronary artery disease, troponin elevation.   1. Severe aortic stenosis:  echo this admission showing severe aortic valve stenosis, in setting of moderately depressed ejection fraction -In her current state, not a good candidate for aortic valve surgery, even TAVR High risk of cardiac arrhythmia, cardiac arrest given her aortic valve stenosis and respiratory issues ----Need to have conversation with family concerning CODE STATUS  if she has meaningful recovery, more communicative/cognitive improvement, could consider alternatives for her aortic valve Currently not a good surgical candidate  2. CAD: -Moderately depressed ejection fraction concerning for underlying CAD -No recent cardiac catheterization -We will discuss goals of care with her daughter, no family available Does not appear to be having symptoms concerning for active ischemia  3. Ischemic cardiomyopathy: -Volume managed by HD -Continue Lopressor and Isordil   4. Severe pulmonary hypertension  right heart pressures at least 80 mmHg on echo Fluid managed by hemodialysis -Likely exacerbated by renal failure and severe aortic valve stenosis  5. ESRD: -Per Renal -With very high right-sided pressures on echocardiogram does not appear to be in distress  6.  Elevated troponin: -Underlying CAD most likely also in the setting of respiratory distress -We'll need to discuss goals of care with her daughter, no plans for cardiac catheterization at this time    Signed, Dossie Arbour, MD [facility] 02/13/2015, 1:06 PM

## 2015-02-13 NOTE — Progress Notes (Signed)
Dr. Juliene PinaMody called with results of abdominal xray verifying placement. Orders received to start feeding.

## 2015-02-13 NOTE — Progress Notes (Signed)
Pt Doff Hoff clotted off. Primary RN tried several attempts to declot with no success. Dr. Sherryll BurgerShah notified and orders were received to replace and verify with chest xray.

## 2015-02-13 NOTE — Progress Notes (Signed)
[facility] - [place] at [facility]   PATIENT NAME: [name]    MR#:  [id number]  DATE OF BIRTH:  12/19/1945  SUBJECTIVE:  CHIEF COMPLAINT:  No chief complaint on file.  about same, still has lot of secretions and cough per nursing.  REVIEW OF SYSTEMS:   ROS not answering many questions at this time  DRUG ALLERGIES:   Allergies  Allergen Reactions  . Contrast Media [Iodinated Diagnostic Agents] Anaphylaxis  . Oxygen Swelling and Rash    Capnography line oxygen tubing monitor    VITALS:  Blood pressure 117/58, pulse 81, temperature 98.6 F (37 C), temperature source Oral, resp. rate 20, height 5\' 6"  (1.676 m), weight 63.5 kg (139 lb 15.9 oz), SpO2 99 %.  PHYSICAL EXAMINATION:  GENERAL:  69 y.o.-year-old patient, no acute distress HEENT: Head atraumatic, normocephalic. Lips and tongue much less swollen, extubated, does have copious oral secretions which I think is chronic for her NECK:  Supple, no jugular venous distention. No thyroid enlargement, no tenderness.  LUNGS: Normal breath sounds bilaterally, no wheezing, rales,rhonchi or crepitation. No use of accessory muscles of respiration. Short shallow respirations CARDIOVASCULAR: S1, S2 normal. No murmurs, rubs, or gallops.  ABDOMEN: Soft, nontender, nondistended. Bowel sounds present. No organomegaly or mass.  EXTREMITIES: No pedal edema, cyanosis, or clubbing.  NEUROLOGIC: Follows simple commands, Rt Hemiparesis LABORATORY PANEL:   CBC  Recent Labs Lab 02/13/15 0553  WBC 4.2  HGB 12.1  HCT 37.4  PLT 178   ------------------------------------------------------------------------------------------------------------------  Chemistries   Recent Labs Lab 02/09/15 0644  02/10/15 2033  02/13/15 0553  NA 135  < >  --   < > 136  K 4.3  < >  --   < > 4.3  CL 96*  < >  --   < > 95*  CO2 26  < >  --   < > 32  GLUCOSE 193*  < >  --   < > 162*  BUN 65*  < >  --   < > 45*  CREATININE  7.28*  < >  --   < > 5.50*  CALCIUM 8.1*  < >  --   < > 8.7*  MG  --   --  1.9  --   --   AST 23  --   --   --   --   ALT 19  --   --   --   --   ALKPHOS 56  --   --   --   --   BILITOT 0.8  --   --   --   --   < > = values in this interval not displayed. ------------------------------------------------------------------------------------------------------------------  Cardiac Enzymes  Recent Labs Lab 02/11/15 0145  TROPONINI 2.52*   ------------------------------------------------------------------------------------------------------------------  RADIOLOGY:  No results found.  EKG:   Orders placed or performed during the hospital encounter of 02/04/15  . EKG 12-Lead  . EKG 12-Lead    ASSESSMENT AND PLAN:   * NSTEMI: troponins 3.26, Cardio already on board. Recent echo showing EF 35-40%,  Has severe AS which may need surgery if she comes out of current illness and family/patient wishes to go that route - certainly will need cath beforehand.  * Angioedema with respiratory compromise: Improved, extubated 11/22 - Pulmonology following, stable after extubation  * altered mental status: Improving. Sluggish but following simple commands - neurology feels that AMS likely postictal - CT head no acute changes, old L  left MCA infarct unchanged -  changing Keppra to reduce dose of 500 mg daily &  500 mg after dialysis - can be stopped after 30 days if no further seizure per Neuro - At baseline patient lives with her daughter. Is mobile. Is alert and mostly oriented. Does have right sided weakness and some aphasia  * ESRD on HD - Nephrology following. Continue hemodialysis per them  * Hypertension - Blood pressure slightly low at this time. Continue metoprolol, isosorbide, hydralazine. Adjust as needed.  * Coronary artery disease - Continue aspirin Plavix and statin  * Nutrition: Cont NGT and TFs for now.    All the records are reviewed and case discussed with Care  Management/Social Worker. Management plans discussed with the nursing.  CODE STATUS: Full   TOTAL TIME TAKING CARE OF THIS PATIENT: 25 minutes.   Greater than 50% of time spent in care coordination and counseling.   POSSIBLE D/C Early next week, DEPENDING ON CLINICAL CONDITION.    [facility], Steffi Noviello M.D on 02/13/2015 at 11:13 AM  Between 7am to 6pm - Pager - [phone]  After 6pm go to [email or web address] - password [email or web address] [facility]  [facility] [place]  Office  [phone]  CC: Primary care physician; Amy Diana Eves, NP

## 2015-02-13 NOTE — Progress Notes (Signed)
PT Cancellation Note  Patient Details Name: [name] MRN: [id number] DOB: 12/18/1945   Cancelled Treatment:    Reason Eval/Treat Not Completed: Patient's level of consciousness  Nursing was in room, reports that pt has been largely non-interactive and is not appropriate to participate with PT today.    [name] 02/13/2015, 12:52 PM

## 2015-02-14 LAB — GLUCOSE, CAPILLARY
GLUCOSE-CAPILLARY: 135 mg/dL — AB (ref 65–99)
GLUCOSE-CAPILLARY: 135 mg/dL — AB (ref 65–99)
GLUCOSE-CAPILLARY: 135 mg/dL — AB (ref 65–99)
Glucose-Capillary: 186 mg/dL — ABNORMAL HIGH (ref 65–99)
Glucose-Capillary: 171 mg/dL — ABNORMAL HIGH (ref 65–99)

## 2015-02-14 NOTE — Progress Notes (Signed)
Patient had small amount of tan liquid emesis on gown when nurse entered room. Spoke with Dr Anne HahnWillis. Hold feeding for 2 hours then restart at 20 ml/hr and monitor patient.

## 2015-02-14 NOTE — Progress Notes (Signed)
[facility] - [place] at [facility]   PATIENT NAME: [name]    MR#:  [id number]  DATE OF BIRTH:  1945-07-20  SUBJECTIVE:  CHIEF COMPLAINT:  No chief complaint on file.  Dobbhoff tube clogged off yesterday, replaced & TF restarted.  Not communicating much and sleeping comfortably  REVIEW OF SYSTEMS:   ROS not answering many questions at this time  DRUG ALLERGIES:   Allergies  Allergen Reactions  . Contrast Media [Iodinated Diagnostic Agents] Anaphylaxis  . Oxygen Swelling and Rash    Capnography line oxygen tubing monitor    VITALS:  Blood pressure 115/63, pulse 75, temperature 98 F (36.7 C), temperature source Oral, resp. rate 14, height  (1.676 m), weight 60.646 kg (133 lb 11.2 oz), SpO2 96 %.  PHYSICAL EXAMINATION:  GENERAL:  69 y.o.-year-old patient, no acute distress HEENT: Head atraumatic, normocephalic. Lips and tongue much less swollen, extubated, does have copious oral secretions which I think is chronic for her NECK:  Supple, no jugular venous distention. No thyroid enlargement, no tenderness.  LUNGS: Normal breath sounds bilaterally, no wheezing, rales,rhonchi or crepitation. No use of accessory muscles of respiration. Short shallow respirations CARDIOVASCULAR: S1, S2 normal. No murmurs, rubs, or gallops.  ABDOMEN: Soft, nontender, nondistended. Bowel sounds present. No organomegaly or mass.  EXTREMITIES: No pedal edema, cyanosis, or clubbing.  NEUROLOGIC: Follows simple commands, Rt Hemiparesis LABORATORY PANEL:   CBC  Recent Labs Lab 02/13/15 0553  WBC 4.2  HGB 12.1  HCT 37.4  PLT 178   ------------------------------------------------------------------------------------------------------------------  Chemistries   Recent Labs Lab 02/09/15 0644  02/10/15 2033  02/13/15 0553  NA 135  < >  --   < > 136  K 4.3  < >  --   < > 4.3  CL 96*  < >  --   < > 95*  CO2 26  < >  --   < > 32  GLUCOSE 193*  < >  --   < >  162*  BUN 65*  < >  --   < > 45*  CREATININE 7.28*  < >  --   < > 5.50*  CALCIUM 8.1*  < >  --   < > 8.7*  MG  --   --  1.9  --   --   AST 23  --   --   --   --   ALT 19  --   --   --   --   ALKPHOS 56  --   --   --   --   BILITOT 0.8  --   --   --   --   < > = values in this interval not displayed. ------------------------------------------------------------------------------------------------------------------  Cardiac Enzymes  Recent Labs Lab 02/11/15 0145  TROPONINI 2.52*   ------------------------------------------------------------------------------------------------------------------  RADIOLOGY:  Dg Abd 1 View  02/13/2015  CLINICAL DATA:  NG tube placement EXAM: ABDOMEN - 1 VIEW COMPARISON:  02/10/2015 FINDINGS: There is normal small bowel gas pattern. Moderate stool and gas noted in right colon. Some colonic stool noted descending colon. NG feeding tube with tip in mid stomach. IMPRESSION: NG feeding tube with tip in mid stomach. Electronically Signed   By: Natasha Mead M.D.   On: 02/13/2015 17:04    EKG:   Orders placed or performed during the hospital encounter of 02/04/15  . EKG 12-Lead  . EKG 12-Lead    ASSESSMENT AND PLAN:   * NSTEMI: troponins 3.26,  Cardio already on board. Recent echo showing EF 35-40%,  Has severe AS which may need surgery if she comes out of current illness and family/patient wishes to go that route - certainly will need cath beforehand.  * Angioedema with respiratory compromise: Improved, extubated 11/22 - Pulmonology following, stable after extubation  * altered mental status: Improving. 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 Is mobile. Is alert and  mostly oriented. 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 Continue hemodialysis per them  * Hypertension - Blood pressure slightly low at this time. Continue metoprolol, isosorbide, hydralazine. Adjust as needed.  * Coronary artery disease - Continue aspirin Plavix and statin  * Nutrition: Cont NGT and TFs for now.  We will consult palliative care   All the records are reviewed and case discussed with Care Management/Social Worker. Management plans discussed with the nursing.  CODE STATUS: Full   TOTAL TIME TAKING CARE OF THIS PATIENT: 15 minutes.   Greater than 50% of time spent in care coordination and counseling.   POSSIBLE D/C Early next week, DEPENDING ON CLINICAL CONDITION.  Will likely need placement   [facility], Johnae Friley M.D on 02/14/2015 at 9:59 AM  Between 7am to 6pm - Pager - [phone]  After 6pm go to [email or web address] - password [email or web address]  [facility]  Office  [phone]  CC: Primary care physician; Amy Diana EvesL Krebs, NP

## 2015-02-14 NOTE — Clinical Social Work Placement (Signed)
   CLINICAL SOCIAL WORK PLACEMENT  NOTE  Date:  02/14/2015  Patient Details  Name: [name] MRN: [id number] Date of Birth: 02/05/1946  Clinical Social Work is seeking post-discharge placement for this patient at the Skilled  Nursing Facility level of care (*CSW will initial, date and re-position this form in  chart as items are completed):  No (spoke with dau on phone)   [facility]/family provided with [facility] Clinical Social Work Department's list of facilities offering this level of care within the geographic area requested by the patient (or if unable, by the patient's family).  Yes   Patient/family informed of their freedom to choose among providers that offer the needed level of care, that participate in Medicare, Medicaid or managed care program needed by the patient, have an available bed and are willing to accept the patient.  Yes   Patient/family informed of [facility]'s ownership interest in [facility] and [facility], as well as of the fact that they are under no obligation to receive care at these facilities.  PASRR submitted to EDS on       PASRR number received on       Existing PASRR number confirmed on       FL2 transmitted to all facilities in geographic area requested by pt/family on 02/14/15     FL2 transmitted to all facilities within larger geographic area on       Patient informed that his/her managed care company has contracts with or will negotiate with certain facilities, including the following:            Patient/family informed of bed offers received.  Patient chooses bed at       Physician recommends and patient chooses bed at      Patient to be transferred to   on  .  Patient to be transferred to facility by       Patient family notified on   of transfer.  Name of family member notified:        PHYSICIAN       Additional Comment:    _______________________________________________ Soundra PilonMoore, Chiyo Fay H, [name] 02/14/2015, 2:44  PM

## 2015-02-14 NOTE — Progress Notes (Signed)
[facility]  ROUNDING NOTE   Subjective:  Patient last had dialysis on Friday. She is due for doses again tomorrow. She remains quite lethargic at present.   Objective:  Vital signs in last 24 hours:  Temp:  [98 F (36.7 C)-99.1 F (37.3 C)] 98 F (36.7 C) (11/27 0542) Pulse Rate:  [75-93] 75 (11/27 0542) Resp:  [14-17] 14 (11/27 0542) BP: (115-128)/(61-63) 115/63 mmHg (11/27 0542) SpO2:  [96 %-100 %] 96 % (11/27 0542) Weight:  [60.646 kg (133 lb 11.2 oz)-62.869 kg (138 lb 9.6 oz)] 60.646 kg (133 lb 11.2 oz) (11/27 0700)  Weight change: 1.469 kg (3 lb 3.8 oz) Filed Weights   02/13/15 0423 02/14/15 0600 02/14/15 0700  Weight: 63.5 kg (139 lb 15.9 oz) 62.869 kg (138 lb 9.6 oz) 60.646 kg (133 lb 11.2 oz)    Intake/Output: I/O last 3 completed shifts: In: 1732 [NG/GT:1732] Out: 1000 [Other:1000]   Intake/Output this shift:  Total I/O In: 41 [NG/GT:41] Out: -   Physical Exam: General: no acute distress   Head: Normocephalic, NG in place  Eyes:/ENT Anicteric, moist membranes  Neck: Supple, trachea midline  Lungs:  Normal effort, clear ant/lat  Heart: S1S2 2/6 SEM  Abdomen:  Soft, nontender, BS present    Extremities:  trace peripheral edema. Soft restraints on hands  Neuro: Resting comfortably in bed, not following commands  Skin: No lesions  Access: LUE AVG    Basic Metabolic Panel:  Recent Labs Lab 02/08/15 0246 02/09/15 0644 02/10/15 0658 02/10/15 2033 02/12/15 1707 02/13/15 0553  NA 130* 135 135  --  134* 136  K 5.0 4.3 4.3  --  4.7 4.3  CL 90* 96* 94*  --  93* 95*  CO2 23 26 26   --  30 32  GLUCOSE 249* 193* 112*  --  168* 162*  BUN 97* 65* 86*  --  73* 45*  CREATININE 9.43* 7.28* 8.62*  --  7.56* 5.50*  CALCIUM 7.4* 8.1* 8.2*  --  8.9 8.7*  MG  --   --   --  1.9  --   --   PHOS  --   --  4.5  --  3.6  --     Liver Function Tests:  Recent Labs Lab 02/09/15 0644 02/12/15 1707  AST 23  --   ALT 19  --   ALKPHOS 56  --    BILITOT 0.8  --   PROT 6.1*  --   ALBUMIN 2.9* 2.7*   No results for input(s): LIPASE, AMYLASE in the last 168 hours. No results for input(s): AMMONIA in the last 168 hours.  CBC:  Recent Labs Lab 02/08/15 0246 02/09/15 0644 02/10/15 0658 02/12/15 1707 02/13/15 0553  WBC 4.4 6.7 7.3 5.6 4.2  HGB 11.4* 12.1 11.7* 12.0 12.1  HCT 34.2* 37.4 36.9 36.6 37.4  MCV 86.9 87.9 87.2 87.1 87.4  PLT 125* 143* 160 187 178    Cardiac Enzymes:  Recent Labs Lab 02/10/15 1423 02/10/15 1936 02/11/15 0145  TROPONINI 3.26* 3.17* 2.52*    BNP: Invalid input(s): POCBNP  CBG:  Recent Labs Lab 02/13/15 1635 02/13/15 2056 02/13/15 2355 02/14/15 0516 02/14/15 0750  GLUCAP 120* 177* 161* 135* 135*    Microbiology: Results for orders placed or performed during the hospital encounter of 02/04/15  MRSA PCR Screening     Status: None   Collection Time: 02/04/15  2:05 PM  Result Value Ref Range Status   MRSA by PCR NEGATIVE NEGATIVE  Final    Comment:        The GeneXpert MRSA Assay (FDA approved for NASAL specimens only), is one component of a comprehensive MRSA colonization surveillance program. It is not intended to diagnose MRSA infection nor to guide or monitor treatment for MRSA infections.     Coagulation Studies: No results for input(s): LABPROT, INR in the last 72 hours.  Urinalysis: No results for input(s): COLORURINE, LABSPEC, PHURINE, GLUCOSEU, HGBUR, BILIRUBINUR, KETONESUR, PROTEINUR, UROBILINOGEN, NITRITE, LEUKOCYTESUR in the last 72 hours.  Invalid input(s): APPERANCEUR    Imaging: Dg Abd 1 View  02/13/2015  CLINICAL DATA:  NG tube placement EXAM: ABDOMEN - 1 VIEW COMPARISON:  02/10/2015 FINDINGS: There is normal small bowel gas pattern. Moderate stool and gas noted in right colon. Some colonic stool noted descending colon. NG feeding tube with tip in mid stomach. IMPRESSION: NG feeding tube with tip in mid stomach. Electronically Signed   By: Natasha Mead  M.D.   On: 02/13/2015 17:04      Medications:   . feeding supplement (NEPRO CARB STEADY) 1,000 mL (02/12/15 2100)   . antiseptic oral rinse  7 mL Mouth Rinse QID  . aspirin  81 mg Per Tube Daily  . atorvastatin  40 mg Per Tube q1800  . calcium acetate (Phos Binder)  1,334 mg Per Tube TID  . chlorhexidine gluconate  15 mL Mouth Rinse BID  . clopidogrel  75 mg Per Tube Daily  . free water  100 mL Per Tube 3 times per day  . heparin subcutaneous  5,000 Units Subcutaneous Q12H  . hydrALAZINE  50 mg Per Tube 3 times per day  . Influenza vac split quadrivalent PF  0.5 mL Intramuscular Tomorrow-1000  . insulin aspart  0-15 Units Subcutaneous 6 times per day  . isosorbide dinitrate  10 mg Per Tube BID  . levETIRAcetam  500 mg Oral Daily   And  . levETIRAcetam  500 mg Oral Q M,W,F-1800  . metoprolol tartrate  12.5 mg Per Tube BID  . senna-docusate  1 tablet Per Tube BID  . vitamin C  500 mg Per Tube BID   acetaminophen **OR** acetaminophen, alteplase, fentaNYL (SUBLIMAZE) injection, heparin, lidocaine (PF), lidocaine-prilocaine, ondansetron, phenol  Assessment/ Plan:  69 y.o. female past medical history of end-stage renal disease, myocardial infarction, CVA, hyperlipidemia, hypertension, diabetes mellitus, depression, secondary hyperparathyroidism, dementia, GERD, anemia chronic kidney disease who presented for a fistulogram and ended up having acute respiratory failure and acute tongue swelling/angioedema.  1.  End-stage renal disease on hemodialysis. M-W-F. @ [facility], [address] Patient due for dialysis again tomorrow.  No acute indication today.  We will repare orders for tomorrow.   2.  Acute respiratory failure/angioedema.  improved -angioedema appears to be fully resolved at this time. Patient doing well off the ventilator.  3.  Anemia chronic kidney disease.  Hemoglobin 12.1 at last check.  Continue to hold Epogen.   4.  Secondary hyperparathyroidism. Followup  phosphorus with next dialysis treatment.  Continue calcium acetate.  LOS: 10 Elener Custodio 11/27/201611:13 AM

## 2015-02-15 LAB — GLUCOSE, CAPILLARY
GLUCOSE-CAPILLARY: 106 mg/dL — AB (ref 65–99)
Glucose-Capillary: 117 mg/dL — ABNORMAL HIGH (ref 65–99)
Glucose-Capillary: 129 mg/dL — ABNORMAL HIGH (ref 65–99)
Glucose-Capillary: 142 mg/dL — ABNORMAL HIGH (ref 65–99)
Glucose-Capillary: 149 mg/dL — ABNORMAL HIGH (ref 65–99)
Glucose-Capillary: 153 mg/dL — ABNORMAL HIGH (ref 65–99)
Glucose-Capillary: 165 mg/dL — ABNORMAL HIGH (ref 65–99)

## 2015-02-15 LAB — PHOSPHORUS: PHOSPHORUS: 3.6 mg/dL (ref 2.5–4.6)

## 2015-02-15 NOTE — Progress Notes (Signed)
[facility]  ROUNDING NOTE   Subjective:  Pt due for HD today. Remains quite lethargic but is arousable. Didn't reliably follow commands.   Objective:  Vital signs in last 24 hours:  Temp:  [98 F (36.7 C)-98.5 F (36.9 C)] 98 F (36.7 C) (11/28 1345) Pulse Rate:  [71-81] 73 (11/28 1430) Resp:  [16-18] 18 (11/28 1430) BP: (113-134)/(53-77) 132/77 mmHg (11/28 1430) SpO2:  [97 %-100 %] 100 % (11/28 1345) Weight:  [62.6 kg (138 lb 0.1 oz)] 62.6 kg (138 lb 0.1 oz) (11/28 1345)  Weight change:  Filed Weights   02/14/15 0600 02/14/15 0700 02/15/15 1345  Weight: 62.869 kg (138 lb 9.6 oz) 60.646 kg (133 lb 11.2 oz) 62.6 kg (138 lb 0.1 oz)    Intake/Output: I/O last 3 completed shifts: In: 1843.7 [NG/GT:1843.7] Out: -    Intake/Output this shift:  Total I/O In: 83 [NG/GT:83] Out: -   Physical Exam: General: no acute distress   Head: Normocephalic, NG in place  Eyes:/ENT Anicteric, moist membranes  Neck: Supple, trachea midline  Lungs:  Normal effort, clear ant/lat  Heart: S1S2 2/6 SEM  Abdomen:  Soft, nontender, BS present    Extremities:  trace peripheral edema. Soft restraints on hands  Neuro: Resting comfortably in bed, not following commands  Skin: No lesions  Access: LUE AVG    Basic Metabolic Panel:  Recent Labs Lab 02/09/15 0644 02/10/15 0658 02/10/15 2033 02/12/15 1707 02/13/15 0553  NA 135 135  --  134* 136  K 4.3 4.3  --  4.7 4.3  CL 96* 94*  --  93* 95*  CO2 26 26  --  30 32  GLUCOSE 193* 112*  --  168* 162*  BUN 65* 86*  --  73* 45*  CREATININE 7.28* 8.62*  --  7.56* 5.50*  CALCIUM 8.1* 8.2*  --  8.9 8.7*  MG  --   --  1.9  --   --   PHOS  --  4.5  --  3.6  --     Liver Function Tests:  Recent Labs Lab 02/09/15 0644 02/12/15 1707  AST 23  --   ALT 19  --   ALKPHOS 56  --   BILITOT 0.8  --   PROT 6.1*  --   ALBUMIN 2.9* 2.7*   No results for input(s): LIPASE, AMYLASE in the last 168 hours. No results for input(s):  AMMONIA in the last 168 hours.  CBC:  Recent Labs Lab 02/09/15 0644 02/10/15 0658 02/12/15 1707 02/13/15 0553  WBC 6.7 7.3 5.6 4.2  HGB 12.1 11.7* 12.0 12.1  HCT 37.4 36.9 36.6 37.4  MCV 87.9 87.2 87.1 87.4  PLT 143* 160 187 178    Cardiac Enzymes:  Recent Labs Lab 02/10/15 1423 02/10/15 1936 02/11/15 0145  TROPONINI 3.26* 3.17* 2.52*    BNP: Invalid input(s): POCBNP  CBG:  Recent Labs Lab 02/14/15 1942 02/15/15 0002 02/15/15 0428 02/15/15 0738 02/15/15 1147  GLUCAP 171* 142* 165* 117* 153*    Microbiology: Results for orders placed or performed during the hospital encounter of 02/04/15  MRSA PCR Screening     Status: None   Collection Time: 02/04/15  2:05 PM  Result Value Ref Range Status   MRSA by PCR NEGATIVE NEGATIVE Final    Comment:        The GeneXpert MRSA Assay (FDA approved for NASAL specimens only), is one component of a comprehensive MRSA colonization surveillance program. It is not intended to  diagnose MRSA infection nor to guide or monitor treatment for MRSA infections.     Coagulation Studies: No results for input(s): LABPROT, INR in the last 72 hours.  Urinalysis: No results for input(s): COLORURINE, LABSPEC, PHURINE, GLUCOSEU, HGBUR, BILIRUBINUR, KETONESUR, PROTEINUR, UROBILINOGEN, NITRITE, LEUKOCYTESUR in the last 72 hours.  Invalid input(s): APPERANCEUR    Imaging: Dg Abd 1 View  02/13/2015  CLINICAL DATA:  NG tube placement EXAM: ABDOMEN - 1 VIEW COMPARISON:  02/10/2015 FINDINGS: There is normal small bowel gas pattern. Moderate stool and gas noted in right colon. Some colonic stool noted descending colon. NG feeding tube with tip in mid stomach. IMPRESSION: NG feeding tube with tip in mid stomach. Electronically Signed   By: Natasha Mead M.D.   On: 02/13/2015 17:04      Medications:   . feeding supplement (NEPRO CARB STEADY) 1,000 mL (02/12/15 2100)   . antiseptic oral rinse  7 mL Mouth Rinse QID  . aspirin  81 mg  Per Tube Daily  . atorvastatin  40 mg Per Tube q1800  . calcium acetate (Phos Binder)  1,334 mg Per Tube TID  . chlorhexidine gluconate  15 mL Mouth Rinse BID  . clopidogrel  75 mg Per Tube Daily  . free water  100 mL Per Tube 3 times per day  . heparin subcutaneous  5,000 Units Subcutaneous Q12H  . hydrALAZINE  50 mg Per Tube 3 times per day  . Influenza vac split quadrivalent PF  0.5 mL Intramuscular Tomorrow-1000  . insulin aspart  0-15 Units Subcutaneous 6 times per day  . isosorbide dinitrate  10 mg Per Tube BID  . levETIRAcetam  500 mg Oral Daily   And  . levETIRAcetam  500 mg Oral Q M,W,F-1800  . metoprolol tartrate  12.5 mg Per Tube BID  . senna-docusate  1 tablet Per Tube BID  . vitamin C  500 mg Per Tube BID   acetaminophen **OR** acetaminophen, alteplase, fentaNYL (SUBLIMAZE) injection, heparin, lidocaine (PF), lidocaine-prilocaine, ondansetron, phenol  Assessment/ Plan:  69 y.o. female past medical history of end-stage renal disease, myocardial infarction, CVA, hyperlipidemia, hypertension, diabetes mellitus, depression, secondary hyperparathyroidism, dementia, GERD, anemia chronic kidney disease who presented for a fistulogram and ended up having acute respiratory failure and acute tongue swelling/angioedema.  1.  End-stage renal disease on hemodialysis. M-W-F. @ [facility], [address] Patient due for dialysis today, UF target 1-1.5kg as tolerated, continue HD on MWF schedule.    2.  Acute respiratory failure/angioedema.  improved -acute respiratory failure and angioedema improved, cause of angioedema not entirely clear.  Breathing comfortably at present.  3.  Anemia chronic kidney disease.  Epogen has been on hold this admission.  4.  Secondary hyperparathyroidism. Check phos today, continue phoslo at this time.  LOS: 11 Ashaunte Standley 11/28/20163:13 PM

## 2015-02-15 NOTE — Progress Notes (Addendum)
[facility] [place] [facility]   PATIENT NAME: [name]    MR#:  [id number]  DATE OF BIRTH:  07/21/1945  SUBJECTIVE:  CHIEF COMPLAINT:  No chief complaint on file. saw her at HD, about same  REVIEW OF SYSTEMS:   ROS not answering many questions at this time  DRUG ALLERGIES:   Allergies  Allergen Reactions  . Contrast Media [Iodinated Diagnostic Agents] Anaphylaxis  . Oxygen Swelling and Rash    Capnography line oxygen tubing monitor    VITALS:  Blood pressure 140/64, pulse 71, temperature 98 F (36.7 C), temperature source Oral, resp. rate 16, height 5\' 6"  (1.676 m), weight 62.6 kg (138 lb 0.1 oz), SpO2 100 %.  PHYSICAL EXAMINATION:  GENERAL:  69 y.o.-year-old patient, no acute distress HEENT: Head atraumatic, normocephalic. Lips and tongue much less swollen, extubated, does have copious oral secretions which I think is chronic for her NECK:  Supple, no jugular venous distention. No thyroid enlargement, no tenderness.  LUNGS: Normal breath sounds bilaterally, no wheezing, rales,rhonchi or crepitation. No use of accessory muscles of respiration. Short shallow respirations CARDIOVASCULAR: S1, S2 normal. No murmurs, rubs, or gallops.  ABDOMEN: Soft, nontender, nondistended. Bowel sounds present. No organomegaly or mass.  EXTREMITIES: No pedal edema, cyanosis, or clubbing.  NEUROLOGIC: Follows simple commands, Rt Hemiparesis LABORATORY PANEL:   CBC  Recent Labs Lab 02/13/15 0553  WBC 4.2  HGB 12.1  HCT 37.4  PLT 178   ------------------------------------------------------------------------------------------------------------------  Chemistries   Recent Labs Lab 02/09/15 0644  02/10/15 2033  02/13/15 0553  NA 135  < >  --   < > 136  K 4.3  < >  --   < > 4.3  CL 96*  < >  --   < > 95*  CO2 26  < >  --   < > 32  GLUCOSE 193*  < >  --   < > 162*  BUN 65*  < >  --   < > 45*  CREATININE 7.28*  < >  --   < > 5.50*  CALCIUM 8.1*   < >  --   < > 8.7*  MG  --   --  1.9  --   --   AST 23  --   --   --   --   ALT 19  --   --   --   --   ALKPHOS 56  --   --   --   --   BILITOT 0.8  --   --   --   --   < > = values in this interval not displayed. ------------------------------------------------------------------------------------------------------------------  Cardiac Enzymes  Recent Labs Lab 02/11/15 0145  TROPONINI 2.52*   ------------------------------------------------------------------------------------------------------------------  RADIOLOGY:  No results found.  EKG:   Orders placed or performed during the hospital encounter of 02/04/15  . EKG 12-Lead  . EKG 12-Lead    ASSESSMENT AND PLAN:   * NSTEMI: troponins 3.26, Cardio already on board. Recent echo showing EF 35-40%,  Has severe AS which may need surgery if she comes out of current illness and family/patient wishes to go that route - certainly will need cath beforehand.  * Angioedema with respiratory compromise: Improved, extubated 11/22 - Pulmonology following, stable since extubation  * altered mental status: Improving. 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 Is mobile. Is alert and mostly oriented. 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 Continue hemodialysis per them  * Hypertension - Blood pressure slightly low at this time. Continue metoprolol, isosorbide, hydralazine. Adjust as needed.  * Coronary artery disease - Continue aspirin Plavix and statin  * Nutrition: Cont NGT and TFs for now.    We will consult palliative care   She  has 4 major organ dysfunction (Kidney, Heart (severe AS and CAD/MI), Neuro/Hypoxic Brain injury and Lung injury from prolonged vent - She has very poor prognosis. In addition Patient currently is not sitting up for dialysis and this would a necessity as no local outpatient dialysis centers can accommodate stretcher dialysis. Another barrier is the dobhoff tube that patient has and that this is not managed by nursing home's either.   All the records are reviewed and case discussed with Care Management/Social Worker. Management plans discussed with the nursing and patient's daughter.  CODE STATUS: Full   TOTAL TIME TAKING CARE OF THIS PATIENT: 35 minutes.   Greater than 50% of time spent in care coordination and counseling. (Discussed with patient's daughter Ms. Love at number she provided: [phone] and left her with some thoughts for [phone] with poor prognosis and D/C barrier with her HD and Nutrition with DHT. I've asked her to think about possible comfort and palliative approach with Hospice if qualify. She seem to agree but would like to talk to other family members. I'll meet tomorrow @ 1 pm with Daughter and rest of the family along with palliative care if schedule allows).  POSSIBLE D/C this week, DEPENDING ON CLINICAL CONDITION.     [facility], Addalie Calles M.D on 02/15/2015 at 7:34 PM  Between 7am to 6pm - Pager - [phone]  After 6pm go to [email or web address] - password [email or web address] [facility]  [facility]  Office  [phone]  CC: Primary care physician; Amy Diana Eves, NP

## 2015-02-15 NOTE — Care Management Important Message (Signed)
Important Message  Patient Details  Name: [name] MRN: [id number] Date of Birth: 02/08/1946   Medicare Important Message Given:  Yes    Olegario MessierKathy A Meaghan Whistler 02/15/2015, 10:39 AM

## 2015-02-15 NOTE — Progress Notes (Addendum)
PT Cancellation Note  Patient Details Name: [name] MRN: [id number] DOB: 05/01/1945   Cancelled Treatment:    Reason Eval/Treat Not Completed: Patient at procedure or test/unavailable. Treatment attempted; pt at hemodialysis. Will re attempt treatment tomorrow.    Elsie StainHeidi Elizabeth Bishop 02/15/2015, 2:21 PM

## 2015-02-15 NOTE — Clinical Social Work Note (Signed)
Patient's daughter called to speak with patient's nurse and had some questions regarding discharge and the nurse referred her to this CSW. Patient's daughter contacted CSW and this CSW discussed with patient's daughter the barriers currently to discharge to a nursing home facility. Patient currently is not sitting up for dialysis and CSW explained that this would a necessity as no local outpatient dialysis centers can accommodate stretcher dialysis. Another barrier is the dobhoff tube that patient has and that this is not managed by nursing home's either. CSW offered to have the physician discuss with her the medical plan and goals and she stated that this would be good. CSW has offered support to patient's daughter and CSW has contacted attending MD and asked him to contact Ms. Love at number she provided: [phone].   As a side note, CSW also made sure to communicate with patient's daughter that I would be covering today through Wednesday and that someone would be covering for me on Thursday and Friday. York SpanielMonica Victor Roberts MSW,[name] [phone]

## 2015-02-15 NOTE — Progress Notes (Signed)
HD tx start 

## 2015-02-15 NOTE — Progress Notes (Signed)
Pre-hd tx 

## 2015-02-15 NOTE — Progress Notes (Signed)
Post hd tx 

## 2015-02-15 NOTE — Progress Notes (Signed)
HD tx completed.

## 2015-02-15 NOTE — Progress Notes (Signed)
Nutrition Follow-up     INTERVENTION:  EN: continue nepro at goal rate of 7140ml/hr to meet nutritional needs   NUTRITION DIAGNOSIS:   Inadequate oral intake related to acute illness as evidenced by NPO status.    GOAL:   Patient will meet greater than or equal to 90% of their needs  Meeting goal via dobhoff feeding  MONITOR:    (Energy Intake, Anthropometrics, Electrolyte/Renal Profile, Glucose Profile, Pulmonary)  REASON FOR ASSESSMENT:   Ventilator, Consult Enteral/tube feeding initiation and management  ASSESSMENT:      Pt continues to be lethargic and not appropriate to take po intake   Current Nutrition: NPO, SLP folllowing.  Dobhoff in place for nutrition   Gastrointestinal Profile: Last BM: 11/27   Scheduled Medications:  . antiseptic oral rinse  7 mL Mouth Rinse QID  . aspirin  81 mg Per Tube Daily  . atorvastatin  40 mg Per Tube q1800  . calcium acetate (Phos Binder)  1,334 mg Per Tube TID  . chlorhexidine gluconate  15 mL Mouth Rinse BID  . clopidogrel  75 mg Per Tube Daily  . free water  100 mL Per Tube 3 times per day  . heparin subcutaneous  5,000 Units Subcutaneous Q12H  . hydrALAZINE  50 mg Per Tube 3 times per day  . Influenza vac split quadrivalent PF  0.5 mL Intramuscular Tomorrow-1000  . insulin aspart  0-15 Units Subcutaneous 6 times per day  . isosorbide dinitrate  10 mg Per Tube BID  . levETIRAcetam  500 mg Oral Daily   And  . levETIRAcetam  500 mg Oral Q M,W,F-1800  . metoprolol tartrate  12.5 mg Per Tube BID  . senna-docusate  1 tablet Per Tube BID  . vitamin C  500 mg Per Tube BID    Continuous Medications:  . feeding supplement (NEPRO CARB STEADY) 1,000 mL (02/12/15 2100)     Electrolyte/Renal Profile and Glucose Profile:   Recent Labs Lab 02/10/15 0658 02/10/15 2033 02/12/15 1707 02/13/15 0553  NA 135  --  134* 136  K 4.3  --  4.7 4.3  CL 94*  --  93* 95*  CO2 26  --  30 32  BUN 86*  --  73* 45*  CREATININE  8.62*  --  7.56* 5.50*  CALCIUM 8.2*  --  8.9 8.7*  MG  --  1.9  --   --   PHOS 4.5  --  3.6  --   GLUCOSE 112*  --  168* 162*   Protein Profile:  Recent Labs Lab 02/09/15 0644 02/12/15 1707  ALBUMIN 2.9* 2.7*      Weight Trend since Admission: Filed Weights   02/14/15 0600 02/14/15 0700 02/15/15 1345  Weight: 138 lb 9.6 oz (62.869 kg) 133 lb 11.2 oz (60.646 kg) 138 lb 0.1 oz (62.6 kg)      Diet Order:  Diet NPO time specified  Skin:  Reviewed, no issues     Height:   Ht Readings from Last 1 Encounters:  02/04/15 5\' 6"  (1.676 m)    Weight:   Wt Readings from Last 1 Encounters:  02/15/15 138 lb 0.1 oz (62.6 kg)    Ideal Body Weight:     BMI:  Body mass index is 22.29 kg/(m^2).  Estimated Nutritional Needs:   Kcal:  BEE 1126 kcals (IF 1.1-1.3, AF 1.2) [id number] kcals/d.   Protein:  (1.2-1.5 g/kg) 70-87 g/kg  Fluid:  1000ml + UOP  EDUCATION NEEDS:   No  education needs identified at this time  [place]. Zenia Resides, [place], [place] (pager)

## 2015-02-15 NOTE — Progress Notes (Signed)
Palliative Care Consult is initiated.  I have not had a discussion with family as yet.    Suan HalterMargaret F Nosson Wender, MD

## 2015-02-16 ENCOUNTER — Other Ambulatory Visit: Payer: Self-pay | Admitting: Family Medicine

## 2015-02-16 DIAGNOSIS — N2581 Secondary hyperparathyroidism of renal origin: Secondary | ICD-10-CM

## 2015-02-16 DIAGNOSIS — F0390 Unspecified dementia without behavioral disturbance: Secondary | ICD-10-CM

## 2015-02-16 DIAGNOSIS — H9190 Unspecified hearing loss, unspecified ear: Secondary | ICD-10-CM

## 2015-02-16 DIAGNOSIS — F329 Major depressive disorder, single episode, unspecified: Secondary | ICD-10-CM

## 2015-02-16 DIAGNOSIS — I69354 Hemiplegia and hemiparesis following cerebral infarction affecting left non-dominant side: Secondary | ICD-10-CM

## 2015-02-16 DIAGNOSIS — E785 Hyperlipidemia, unspecified: Secondary | ICD-10-CM

## 2015-02-16 DIAGNOSIS — D638 Anemia in other chronic diseases classified elsewhere: Secondary | ICD-10-CM

## 2015-02-16 DIAGNOSIS — Z931 Gastrostomy status: Secondary | ICD-10-CM

## 2015-02-16 DIAGNOSIS — I2510 Atherosclerotic heart disease of native coronary artery without angina pectoris: Secondary | ICD-10-CM

## 2015-02-16 DIAGNOSIS — E46 Unspecified protein-calorie malnutrition: Secondary | ICD-10-CM

## 2015-02-16 DIAGNOSIS — Z515 Encounter for palliative care: Secondary | ICD-10-CM

## 2015-02-16 DIAGNOSIS — R569 Unspecified convulsions: Secondary | ICD-10-CM

## 2015-02-16 DIAGNOSIS — G9341 Metabolic encephalopathy: Secondary | ICD-10-CM

## 2015-02-16 DIAGNOSIS — I120 Hypertensive chronic kidney disease with stage 5 chronic kidney disease or end stage renal disease: Secondary | ICD-10-CM

## 2015-02-16 DIAGNOSIS — I5022 Chronic systolic (congestive) heart failure: Secondary | ICD-10-CM

## 2015-02-16 DIAGNOSIS — Z992 Dependence on renal dialysis: Secondary | ICD-10-CM

## 2015-02-16 DIAGNOSIS — E119 Type 2 diabetes mellitus without complications: Secondary | ICD-10-CM

## 2015-02-16 LAB — GLUCOSE, CAPILLARY
GLUCOSE-CAPILLARY: 100 mg/dL — AB (ref 65–99)
GLUCOSE-CAPILLARY: 124 mg/dL — AB (ref 65–99)
GLUCOSE-CAPILLARY: 139 mg/dL — AB (ref 65–99)
GLUCOSE-CAPILLARY: 136 mg/dL — AB (ref 65–99)
Glucose-Capillary: 110 mg/dL — ABNORMAL HIGH (ref 65–99)
Glucose-Capillary: 134 mg/dL — ABNORMAL HIGH (ref 65–99)
Glucose-Capillary: 158 mg/dL — ABNORMAL HIGH (ref 65–99)

## 2015-02-16 MED ORDER — RANITIDINE HCL 150 MG PO TABS: 150.0000 mg | ORAL_TABLET | Freq: Every day | ORAL | Status: DC

## 2015-02-16 NOTE — Progress Notes (Signed)
[facility]  ROUNDING NOTE   Subjective:  Patient completed dialysis yesterday. Tolerated this well. Phosphorus currently except what 3.6. Patient appears to be slightly more interactive today.   Objective:  Vital signs in last 24 hours:  Temp:  [97.7 F (36.5 C)-98 F (36.7 C)] 97.9 F (36.6 C) (11/29 0501) Pulse Rate:  [66-80] 66 (11/29 0625) Resp:  [16-22] 18 (11/29 0501) BP: (115-151)/(55-77) 123/60 mmHg (11/29 0625) SpO2:  [97 %-100 %] 97 % (11/29 0625) Weight:  [62.6 kg (138 lb 0.1 oz)-63.368 kg (139 lb 11.2 oz)] 63.368 kg (139 lb 11.2 oz) (11/29 0505)  Weight change:  Filed Weights   02/14/15 0700 02/15/15 1345 02/16/15 0505  Weight: 60.646 kg (133 lb 11.2 oz) 62.6 kg (138 lb 0.1 oz) 63.368 kg (139 lb 11.2 oz)    Intake/Output: I/O last 3 completed shifts: In: 1568.7 [NG/GT:1568.7] Out: 1000 [Other:1000]   Intake/Output this shift:     Physical Exam: General: no acute distress   Head: Normocephalic, NG in place  Eyes:/ENT Anicteric, moist membranes  Neck: Supple, trachea midline  Lungs:  Normal effort, clear ant/lat  Heart: S1S2 2/6 SEM  Abdomen:  Soft, nontender, BS present    Extremities:  trace peripheral edema. Soft restraints on hands  Neuro: Resting comfortably in bed, followed commands this AM  Skin: No lesions  Access: LUE AVG    Basic Metabolic Panel:  Recent Labs Lab 02/10/15 0658 02/10/15 2033 02/12/15 1707 02/13/15 0553 02/15/15 2247  NA 135  --  134* 136  --   K 4.3  --  4.7 4.3  --   CL 94*  --  93* 95*  --   CO2 26  --  30 32  --   GLUCOSE 112*  --  168* 162*  --   BUN 86*  --  73* 45*  --   CREATININE 8.62*  --  7.56* 5.50*  --   CALCIUM 8.2*  --  8.9 8.7*  --   MG  --  1.9  --   --   --   PHOS 4.5  --  3.6  --  3.6    Liver Function Tests:  Recent Labs Lab 02/12/15 1707  ALBUMIN 2.7*   No results for input(s): LIPASE, AMYLASE in the last 168 hours. No results for input(s): AMMONIA in the last 168  hours.  CBC:  Recent Labs Lab 02/10/15 0658 02/12/15 1707 02/13/15 0553  WBC 7.3 5.6 4.2  HGB 11.7* 12.0 12.1  HCT 36.9 36.6 37.4  MCV 87.2 87.1 87.4  PLT 160 187 178    Cardiac Enzymes:  Recent Labs Lab 02/10/15 1423 02/10/15 1936 02/11/15 0145  TROPONINI 3.26* 3.17* 2.52*    BNP: Invalid input(s): POCBNP  CBG:  Recent Labs Lab 02/15/15 1147 02/15/15 1759 02/15/15 2254 02/16/15 0031 02/16/15 0454  GLUCAP 153* 106* 149* 158* 100*    Microbiology: Results for orders placed or performed during the hospital encounter of 02/04/15  MRSA PCR Screening     Status: None   Collection Time: 02/04/15  2:05 PM  Result Value Ref Range Status   MRSA by PCR NEGATIVE NEGATIVE Final    Comment:        The GeneXpert MRSA Assay (FDA approved for NASAL specimens only), is one component of a comprehensive MRSA colonization surveillance program. It is not intended to diagnose MRSA infection nor to guide or monitor treatment for MRSA infections.     Coagulation Studies: No results for  input(s): LABPROT, INR in the last 72 hours.  Urinalysis: No results for input(s): COLORURINE, LABSPEC, PHURINE, GLUCOSEU, HGBUR, BILIRUBINUR, KETONESUR, PROTEINUR, UROBILINOGEN, NITRITE, LEUKOCYTESUR in the last 72 hours.  Invalid input(s): APPERANCEUR    Imaging: No results found.    Medications:   . feeding supplement (NEPRO CARB STEADY) 1,000 mL (02/15/15 2050)   . antiseptic oral rinse  7 mL Mouth Rinse QID  . aspirin  81 mg Per Tube Daily  . atorvastatin  40 mg Per Tube q1800  . calcium acetate (Phos Binder)  1,334 mg Per Tube TID  . chlorhexidine gluconate  15 mL Mouth Rinse BID  . clopidogrel  75 mg Per Tube Daily  . free water  100 mL Per Tube 3 times per day  . heparin subcutaneous  5,000 Units Subcutaneous Q12H  . hydrALAZINE  50 mg Per Tube 3 times per day  . Influenza vac split quadrivalent PF  0.5 mL Intramuscular Tomorrow-1000  . insulin aspart  0-15 Units  Subcutaneous 6 times per day  . isosorbide dinitrate  10 mg Per Tube BID  . levETIRAcetam  500 mg Oral Daily   And  . levETIRAcetam  500 mg Oral Q M,W,F-1800  . metoprolol tartrate  12.5 mg Per Tube BID  . senna-docusate  1 tablet Per Tube BID  . vitamin C  500 mg Per Tube BID   acetaminophen **OR** acetaminophen, alteplase, fentaNYL (SUBLIMAZE) injection, heparin, lidocaine (PF), lidocaine-prilocaine, ondansetron, phenol  Assessment/ Plan:  69 y.o. female past medical history of end-stage renal disease, myocardial infarction, CVA, hyperlipidemia, hypertension, diabetes mellitus, depression, secondary hyperparathyroidism, dementia, GERD, anemia chronic kidney disease who presented for a fistulogram and ended up having acute respiratory failure and acute tongue swelling/angioedema.  1.  End-stage renal disease on hemodialysis. M-W-F. @ [facility], [address] Patient completed dialysis yesterday. No acute indication for dialysis today. We will plan for dialysis again tomorrow..    2.  Acute respiratory failure/angioedema.  improved -As before her angioedema has resolved. She also appears to be breathing comfortably and has remained off the ventilator. Continue to monitor respiratory status..  3.  Anemia chronic kidney disease.  Most recent hemoglobin has been stable at 12.1. No indication for Epogen at present..  4.  Secondary hyperparathyroidism. Phosphorus 3.6 and acceptable. Continue current dosage of PhosLo.  LOS: 12 [name] 11/29/20167:45 AM

## 2015-02-16 NOTE — Care Management Note (Signed)
[facility] [address] MWF.  I will send additional medical records to clinic at discharge.  Ivor ReiningKim Valjean Ruppel Dialysis Liaison [phone]

## 2015-02-16 NOTE — Progress Notes (Signed)
Physical Therapy Treatment Patient Details Name: [name] MRN: [id number] DOB: 03/04/1946 Today's Date: 02/16/2015    History of Present Illness presented to ER secondary to angioedema with airway compromise requiring intubation (11/17-11/22) after L UE fistulogram (due to thrombosed AV graft); admitted for acute respiratory failure with AMS, angioedema.  Hospital course also significant for possible seizure-like activity, managed with keppra.    PT Comments    Pt agreeable to exercises/PT and denies pain. Post initial question/answer, pt does not respond verbally anymore and does not follow any instruction for assisting with exercises movement. All lower extremity exercises performed 10 times passive range of motion. Continue PT for progression of active assisted range of motion to active range of motion/strength to allow for progression to functional mobility.   Follow Up Recommendations  SNF     Equipment Recommendations       Recommendations for Other Services       Precautions / Restrictions Restrictions Weight Bearing Restrictions: No    Mobility  Bed Mobility                  Transfers                    Ambulation/Gait                 Stairs            Wheelchair Mobility    Modified Rankin (Stroke Patients Only)       Balance                                    Cognition                            Exercises General Exercises - Lower Extremity Ankle Circles/Pumps: PROM;Both;10 reps;Supine Quad Sets: Other (comment) (unable to follow commands) Short Arc Quad: PROM;Both;10 reps;Supine Heel Slides: PROM;Both;10 reps;Supine Hip ABduction/ADduction: PROM;Both;10 reps;Supine    General Comments        Pertinent Vitals/Pain      Home Living                      Prior Function            PT Goals (current goals can now be found in the care plan section) Progress towards PT  goals: Not progressing toward goals - comment    Frequency  Min 2X/week    PT Plan Current plan remains appropriate    Co-evaluation             End of Session   Activity Tolerance: Other (comment) (cognition; flat affect) Patient left: in bed;with call bell/phone within reach;with bed alarm set;with SCD's reapplied     Time: 1140-1150 PT Time Calculation (min) (ACUTE ONLY): 10 min  Charges:  $Therapeutic Exercise: 8-22 mins                    G Codes:      Kristeen MissHeidi Elizabeth Bishop 02/16/2015, 12:33 PM

## 2015-02-16 NOTE — Progress Notes (Signed)
Palliative Care Update  I have talked with ST and there is a plan to perform a Modified Swallow Study in the morning --BUT the NG tube has to come out before this is done.  So there is now an order to DC NG tube and NG feedings in the morning after giving morning meds.  Left message to that effect with pts daughter.     Diamantina MonksMF Yaxiel Minnie, MD

## 2015-02-16 NOTE — Progress Notes (Signed)
[facility] [place] [facility]   PATIENT NAME: [name]    MR#:  [id number]  DATE OF BIRTH:  08/12/1945  SUBJECTIVE:  CHIEF COMPLAINT:  No chief complaint on file. about same.  REVIEW OF SYSTEMS:   ROS not answering many questions at this time  DRUG ALLERGIES:   Allergies  Allergen Reactions  . Contrast Media [Iodinated Diagnostic Agents] Anaphylaxis  . Oxygen Swelling and Rash    Capnography line oxygen tubing monitor    VITALS:  Blood pressure 123/60, pulse 66, temperature 97.9 F (36.6 C), temperature source Oral, resp. rate 18, height 5\' 6"  (1.676 m), weight 63.368 kg (139 lb 11.2 oz), SpO2 97 %.  PHYSICAL EXAMINATION:  GENERAL:  69 y.o.-year-old patient, no acute distress HEENT: Head atraumatic, normocephalic. Lips and tongue much less swollen, extubated, does have copious oral secretions which I think is chronic for her NECK:  Supple, no jugular venous distention. No thyroid enlargement, no tenderness.  LUNGS: Normal breath sounds bilaterally, no wheezing, rales,rhonchi or crepitation. No use of accessory muscles of respiration. Short shallow respirations CARDIOVASCULAR: S1, S2 normal. No murmurs, rubs, or gallops.  ABDOMEN: Soft, nontender, nondistended. Bowel sounds present. No organomegaly or mass.  EXTREMITIES: No pedal edema, cyanosis, or clubbing.  NEUROLOGIC: Follows simple commands, Rt Hemiparesis LABORATORY PANEL:   CBC  Recent Labs Lab 02/13/15 0553  WBC 4.2  HGB 12.1  HCT 37.4  PLT 178   ------------------------------------------------------------------------------------------------------------------  Chemistries   Recent Labs Lab 02/10/15 2033  02/13/15 0553  NA  --   < > 136  K  --   < > 4.3  CL  --   < > 95*  CO2  --   < > 32  GLUCOSE  --   < > 162*  BUN  --   < > 45*  CREATININE  --   < > 5.50*  CALCIUM  --   < > 8.7*  MG 1.9  --   --   < > = values in this interval not  displayed. ------------------------------------------------------------------------------------------------------------------  Cardiac Enzymes  Recent Labs Lab 02/11/15 0145  TROPONINI 2.52*   ------------------------------------------------------------------------------------------------------------------  RADIOLOGY:  No results found.  EKG:   Orders placed or performed during the hospital encounter of 02/04/15  . EKG 12-Lead  . EKG 12-Lead    ASSESSMENT AND PLAN:   * NSTEMI: troponins 3.26, Cardio already on board. Recent echo showing EF 35-40%,  Has severe AS which may need surgery if she comes out of current illness and family/patient wishes to go that route - certainly will need cath beforehand.  * Angioedema with respiratory compromise: Improved, extubated 11/22 - Pulmonology following, stable since extubation  * altered mental status: Improving. 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 Is mobile. Is alert and mostly oriented. 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 Continue hemodialysis per them - getting it M-W-F  * Hypertension - Blood pressure well controlled at this time. Continue metoprolol, isosorbide, hydralazine. Adjust as needed.  * Coronary artery disease - Continue aspirin  Plavix and statin  * Nutrition: Cont NGT and TFs for now.    pending consult to palliative care   She has 4 major organ dysfunction (Kidney, Heart (severe AS and CAD/MI), Neuro/Hypoxic Brain injury and Lung injury from prolonged vent - She has very poor prognosis. In addition Patient  currently is not sitting up for dialysis and this would a necessity as no local outpatient dialysis centers can accommodate stretcher dialysis. Another barrier is the dobhoff tube that patient has and that this is not managed by nursing home's either.   All the records are reviewed and case discussed with Care Management/Social Worker. Management plans discussed with the nursing and patient's daughter.  CODE STATUS: Full   TOTAL TIME TAKING CARE OF THIS PATIENT: 15 minutes.   Greater than 50% of time spent in care coordination and counseling. (last night Discussed with patient's daughter Ms. Love at number she provided: [phone] and left her with some thoughts for living will with poor prognosis and D/C barrier with her HD and Nutrition with DHT. I've asked her to think about possible comfort and palliative approach with Hospice if qualify. She seem to agree but would like to talk to other family members. I'll meet today @ 1 pm with Daughter and rest of the family along with palliative care if schedule allows).  POSSIBLE D/C this week, DEPENDING ON CLINICAL CONDITION.     [facility], Andoni Busch M.D on 02/16/2015 at 8:33 AM  Between 7am to 6pm - Pager - [phone]  After 6pm go to [email or web address] - password [email or web address] [facility]  [facility]  Office  [phone]  CC: Primary care physician; Amy Diana Eves, NP

## 2015-02-16 NOTE — Consult Note (Signed)
Palliative Medicine Inpatient Consult Note   Name: [name] Date: 02/16/2015 MRN: [id number]  DOB: 10-03-1945  Referring Physician: Max Sane, MD  Palliative Care consult requested for this 69 y.o. female for goals of medical therapy in patient with multi-organ disease processes. She is currently getting HD while on a stretcher as she has been unable to sit up for this and she currently still has NG tube feedings going. I was asked to bring up options including possible hospice options with pt's daughter, since the patient is not showing signs of a robust recovery from this hospitalization.     TODAY'S DISCUSSIONS AND DECISIONS: 1.  I discussed code status but we really did not get to a point where any decision was made on this particular matter, as the substance of the meeting was focused on care for pt going forward (aggressive vs comfort).  2.  I met with family from 1pm to 2:05 pm today.  She had her mother-in-law (a Marine scientist) with her and her husband, Farrel Conners, was on speaker-phone until he arrived in person.    3.  We had a very pleasant discussion of the medical facts but also the philosophical or spiritual issues surrounding the patient's current condition.  Daughter expressed concerns about having to decide to withdraw or stop things.  I rephrased this as a choice to continue to force aggressive medical technology on someone who may not want this as she may be wanting to pass on.  Most of what I said seemed to be well-received and I listened to her concerns and issues associated with making any choices for less aggressive medical care for her mother.  The daughter would like several things to take place and then we can meet again in two days>  4.  Daughter (and her husband) agree that they want to see a swallow test done and resulted. We need to DC the NG soon no matter what (daughter understands) but she hopes that her mother can again eat some texture of food and liquids. She would want  her to have oral food and does not favor a PEG tube --even if her swallowing is not very operative.  Long talk about NG tubes being very temporary and the negative side of PEG tubes ensued today.    5.  They would like to see if PT can make progress with getting pt to sit up.    6.  In two days, we will talk/ meet again.  I have updated Social Work and Attending. -------------------------------------------------------------------------------  IMPRESSION: ESRD --now not sitting up for HD Acute hypoxic respiratory failure with CODE BLUE called  ---occurred 02/04/15 secondary to fistulogram procedure with angioedema  Having occurred. 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 Moderate Malnutrition due to dementia   ------------------------------------------------------------------------------------------------------ REVIEW OF SYSTEMS:  Patient is not able to provide ROS due to dementia  SPIRITUAL SUPPORT SYSTEM: Yes --family (daughter and son-in-law and mother-in-law who is a Marine scientist). They reveal they do believe in an after-life and are spiritual people.  SOCIAL HISTORY:  reports that she has never smoked. She does not have any smokeless tobacco history on file. She  reports that she does not drink alcohol or use illicit drugs.  LEGAL DOCUMENTS:  none  CODE STATUS: Full code  PAST MEDICAL HISTORY: Past Medical History  Diagnosis Date  . Stroke ([place])   . MI (myocardial infarction) ([place])   . Hearing impaired   . ESRD (end stage renal disease) ([place])   . Hyperlipidemia   . Hypertension   . Diabetes mellitus without complication ([place])   . Depression   . Coronary artery disease   . Dementia   . Secondary hyperparathyroidism ([place])   . Dialysis patient [facility])     Monday, Wednesday and Friday.   . Chronic systolic CHF (congestive heart failure) ([place])   . GERD (gastroesophageal reflux disease)   . Personal history of transient ischemic attack (TIA) and cerebral infarction without residual deficit   . Anemia   . Moderate aortic stenosis     a. echo 04/2014: 40-50%, moderate AS    PAST SURGICAL HISTORY:  Past Surgical History  Procedure Laterality Date  . Cataract extraction, bilateral    . Wrist surgery    . Peripheral vascular catheterization Left 07/28/2014    Procedure: A/V Shuntogram/Fistulagram;  Surgeon: Katha Cabal, MD;  Location: [place] [facility];  Service: Cardiovascular;  Laterality: Left;  . Peripheral vascular catheterization Left 07/28/2014    Procedure: A/V Shunt Intervention;  Surgeon: Katha Cabal, MD;  Location: [place] [facility];  Service: Cardiovascular;  Laterality: Left;  . Peripheral vascular catheterization Left 02/04/2015    Procedure: A/V Shuntogram/Fistulagram;  Surgeon: Algernon Huxley, MD;  Location: [place] [facility];  Service: Cardiovascular;  Laterality: Left;  . Peripheral vascular catheterization Left 02/04/2015    Procedure: A/V Shunt Intervention;  Surgeon: Algernon Huxley, MD;  Location: [place] [facility];  Service: Cardiovascular;  Laterality: Left;    ALLERGIES:  is allergic to contrast media and oxygen.  MEDICATIONS:  Current Facility-Administered Medications  Medication Dose Route  Frequency Provider Last Rate Last Dose  . acetaminophen (TYLENOL) tablet 325-650 mg  325-650 mg Oral Q4H PRN Algernon Huxley, MD       Or  . acetaminophen (TYLENOL) suppository 325-650 mg  325-650 mg Rectal Q4H PRN Algernon Huxley, MD      . alteplase (CATHFLO ACTIVASE) injection 2 mg  2 mg Intracatheter Once PRN Munsoor Lateef, MD      . antiseptic oral rinse solution (CORINZ)  7 mL Mouth Rinse QID Vaughan Basta, MD   7 mL at 02/16/15 0501  . aspirin chewable tablet 81 mg  81 mg Per Tube Daily Vaughan Basta, MD   81 mg at 02/16/15 1130  . atorvastatin (LIPITOR) tablet 40 mg  40 mg Per Tube q1800 Wilhelmina Mcardle, MD   40 mg at 02/15/15 1828  . calcium acetate (Phos Binder) (PHOSLYRA) 667 MG/5ML oral solution 1,334 mg  1,334 mg Per Tube TID Munsoor Lateef, MD   1,334 mg at 02/16/15 1342  . chlorhexidine gluconate (PERIDEX) 0.12 % solution 15 mL  15 mL Mouth Rinse BID Vaughan Basta, MD   15 mL at 02/15/15 1130  . clopidogrel (PLAVIX) tablet 75 mg  75 mg Per Tube Daily Wilhelmina Mcardle, MD   75  mg at 02/16/15 1132  . feeding supplement (NEPRO CARB STEADY) liquid 1,000 mL  1,000 mL Oral Continuous Wilhelmina Mcardle, MD 20 mL/hr at 02/15/15 2050 1,000 mL at 02/15/15 2050  . fentaNYL (SUBLIMAZE) injection 12.5 mcg  12.5 mcg Intravenous Q1H PRN Wilhelmina Mcardle, MD      . free water 100 mL  100 mL Per Tube 3 times per day Flora Lipps, MD   100 mL at 02/16/15 1400  . heparin injection 1,000 Units  1,000 Units Dialysis PRN Munsoor Lateef, MD      . heparin injection 5,000 Units  5,000 Units Subcutaneous Q12H Laverle Hobby, MD   5,000 Units at 02/16/15 1132  . hydrALAZINE (APRESOLINE) tablet 50 mg  50 mg Per Tube 3 times per day Wilhelmina Mcardle, MD   50 mg at 02/16/15 0631  . Influenza vac split quadrivalent PF (FLUARIX) injection 0.5 mL  0.5 mL Intramuscular Tomorrow-1000 Vipul Shah, MD   0.5 mL at 02/14/15 1117  . insulin aspart (novoLOG) injection 0-15 Units  0-15 Units Subcutaneous 6  times per day Lytle Butte, MD   2 Units at 02/16/15 1341  . isosorbide dinitrate (ISORDIL) tablet 10 mg  10 mg Per Tube BID Aldean Jewett, MD   10 mg at 02/16/15 1131  . levETIRAcetam (KEPPRA) 100 MG/ML solution 500 mg  500 mg Oral Daily Vishal Mungal, MD   500 mg at 02/15/15 1828   And  . levETIRAcetam (KEPPRA) 100 MG/ML solution 500 mg  500 mg Oral Q M,W,F-1800 Vishal Mungal, MD   500 mg at 02/15/15 1144  . lidocaine (PF) (XYLOCAINE) 1 % injection 5 mL  5 mL Intradermal PRN Munsoor Lateef, MD      . lidocaine-prilocaine (EMLA) cream 1 application  1 application Topical PRN Munsoor Lateef, MD      . metoprolol tartrate (LOPRESSOR) tablet 12.5 mg  12.5 mg Per Tube BID Wilhelmina Mcardle, MD   12.5 mg at 02/16/15 1131  . ondansetron (ZOFRAN) injection 4 mg  4 mg Intravenous Q6H PRN Algernon Huxley, MD      . phenol (CHLORASEPTIC) mouth spray 1 spray  1 spray Mouth/Throat PRN Algernon Huxley, MD      . senna-docusate (Senokot-S) tablet 1 tablet  1 tablet Per Tube BID Wilhelmina Mcardle, MD   1 tablet at 02/16/15 1131  . vitamin C (ASCORBIC ACID) tablet 500 mg  500 mg Per Tube BID Wilhelmina Mcardle, MD   500 mg at 02/16/15 1132    Vital Signs: BP 109/52 mmHg  Pulse 70  Temp(Src) 98.8 F (37.1 C) (Oral)  Resp 16  Ht 5' 6"  (1.676 m)  Wt 63.368 kg (139 lb 11.2 oz)  BMI 22.56 kg/m2  SpO2 98% Filed Weights   02/14/15 0700 02/15/15 1345 02/16/15 0505  Weight: 60.646 kg (133 lb 11.2 oz) 62.6 kg (138 lb 0.1 oz) 63.368 kg (139 lb 11.2 oz)    Estimated body mass index is 22.56 kg/(m^2) as calculated from the following:   Height as of this encounter: 5' 6"  (1.676 m).   Weight as of this encounter: 63.368 kg (139 lb 11.2 oz).  PERFORMANCE STATUS (ECOG) : 4 - Bedbound  PHYSICAL EXAM: NAD Awake but lethargic EOMI --follows briefly Daughter says she was actually talking earlier (she is most alert at 4 am reportedly) Neck w/o JVD or TM Heart rrr with 2/6 AS murmur Lungs cta but with decreased BS  bases Abd soft and  NT Skin no mottling or cyanosis at this time   LABS: CBC:    Component Value Date/Time   WBC 4.2 02/13/2015 0553   WBC 4.1 04/24/2014 1037   HGB 12.1 02/13/2015 0553   HGB 8.3* 04/24/2014 1037   HCT 37.4 02/13/2015 0553   HCT 25.1* 04/24/2014 1037   PLT 178 02/13/2015 0553   PLT 123* 04/24/2014 1037   MCV 87.4 02/13/2015 0553   MCV 90 04/24/2014 1037   NEUTROABS 2.9 12/02/2014 1428   NEUTROABS 2.8 04/24/2014 1037   LYMPHSABS 0.6* 12/02/2014 1428   LYMPHSABS 0.7* 04/24/2014 1037   MONOABS 0.5 12/02/2014 1428   MONOABS 0.4 04/24/2014 1037   EOSABS 0.1 12/02/2014 1428   EOSABS 0.2 04/24/2014 1037   BASOSABS 0.1 12/02/2014 1428   BASOSABS 0.0 04/24/2014 1037   Comprehensive Metabolic Panel:    Component Value Date/Time   NA 136 02/13/2015 0553   NA 133* 04/24/2014 1037   K 4.3 02/13/2015 0553   K 4.5 04/24/2014 1037   CL 95* 02/13/2015 0553   CL 93* 04/24/2014 1037   CO2 32 02/13/2015 0553   CO2 30 04/24/2014 1037   BUN 45* 02/13/2015 0553   BUN 48* 04/24/2014 1037   CREATININE 5.50* 02/13/2015 0553   CREATININE 6.12* 04/24/2014 1037   GLUCOSE 162* 02/13/2015 0553   GLUCOSE 229* 04/24/2014 1037   CALCIUM 8.7* 02/13/2015 0553   CALCIUM 8.1* 04/24/2014 1037   AST 23 02/09/2015 0644   AST 79* 07/16/2013 1750   ALT 19 02/09/2015 0644   ALT 24 07/16/2013 1750   ALKPHOS 56 02/09/2015 0644   ALKPHOS 107 07/16/2013 1750   BILITOT 0.8 02/09/2015 0644   BILITOT 0.4 07/16/2013 1750   PROT 6.1* 02/09/2015 0644   PROT 8.1 07/16/2013 1750   ALBUMIN 2.7* 02/12/2015 1707   ALBUMIN 2.7* 04/24/2014 1037    TESTS:  Head CT 02/06/15: No intraparenchymal hemorrhage, mass effect, midline shift or acute large vascular territory infarcts. LEFT parietal encephalomalacia, LEFT occipital lobe encephalomalacia, unchanged. Old bilateral thalamus lacunar infarcts. Patchy to confluent supratentorial white matter hypodensities are unchanged.No abnormal extra-axial  fluid collections. Severe calcific atherosclerosis of the carotid siphons. Imaged paranasal sinuses and mastoid air cells are well aerated. Included ocular globes and orbital contents are normal. Chronic changes including old LEFT MCA and PCA territory infarcts, old bilateral basal ganglia and thalamus lacunar infarcts and moderate chronic small vessel ischemic disease    ECHO 02/10/15 - Left ventricle: There was moderate concentric hypertrophy. Systolic function was moderately reduced. The estimated ejection fraction was in the range of 35% to 40%. - Aortic valve: There was severe stenosis. There was mild regurgitation. Valve area (VTI): 0.44 cm^2. Valve area (Vmax): 0.39 cm^2. Valve area (Vmean): 0.41 cm^2. - Mitral valve: There was mild regurgitation. - Left atrium: The atrium was mildly dilated. - Right atrium: The atrium was mildly dilated. - Tricuspid valve: There was moderate regurgitation. - Pulmonic valve: Peak gradient (S): 24 mm Hg.  More than 50% of the visit was spent in counseling/coordination of care: Yes  Time Spent: 110 minutes

## 2015-02-17 ENCOUNTER — Inpatient Hospital Stay: Payer: Medicare Other

## 2015-02-17 LAB — GLUCOSE, CAPILLARY
GLUCOSE-CAPILLARY: 194 mg/dL — AB (ref 65–99)
GLUCOSE-CAPILLARY: 179 mg/dL — AB (ref 65–99)
GLUCOSE-CAPILLARY: 99 mg/dL (ref 65–99)
Glucose-Capillary: 178 mg/dL — ABNORMAL HIGH (ref 65–99)
Glucose-Capillary: 124 mg/dL — ABNORMAL HIGH (ref 65–99)

## 2015-02-17 LAB — PHOSPHORUS: Phosphorus: 3.4 mg/dL (ref 2.5–4.6)

## 2015-02-17 LAB — PLATELET COUNT: PLATELETS: 168 10*3/uL (ref 150–440)

## 2015-02-17 MED ORDER — CALCIUM ACETATE (PHOS BINDER) 667 MG/5ML PO SOLN
1334.0000 mg | Freq: Three times a day (TID) | ORAL | Status: DC
  Administered 2015-02-18 – 2015-02-23 (×14): 1334 mg via ORAL
  Filled 2015-02-17 (×31): qty 10

## 2015-02-17 MED ORDER — PROCHLORPERAZINE 25 MG RE SUPP
25.0000 mg | Freq: Two times a day (BID) | RECTAL | Status: DC | PRN
  Filled 2015-02-17: qty 1

## 2015-02-17 MED ORDER — BISACODYL 10 MG RE SUPP: 10.0000 mg | Freq: Every day | RECTAL | Status: DC | PRN

## 2015-02-17 MED ORDER — HYDRALAZINE HCL 50 MG PO TABS
50.0000 mg | ORAL_TABLET | Freq: Three times a day (TID) | ORAL | Status: DC
  Administered 2015-02-18 – 2015-02-23 (×13): 50 mg via ORAL
  Filled 2015-02-17 (×15): qty 1

## 2015-02-17 MED ORDER — METOPROLOL TARTRATE 25 MG PO TABS
12.5000 mg | ORAL_TABLET | Freq: Two times a day (BID) | ORAL | Status: DC
Start: 2015-02-17 — End: 2015-02-23
  Administered 2015-02-18 – 2015-02-23 (×10): 12.5 mg via ORAL
  Filled 2015-02-17 (×5): qty 1
  Filled 2015-02-17: qty 2
  Filled 2015-02-17 (×6): qty 1

## 2015-02-17 NOTE — Progress Notes (Signed)
Palliative Medicine Inpatient Consult Follow Up Note   Name: [name] Date: 02/17/2015 MRN: [id number]  DOB: [birth year]  Referring Physician: Delfino Lovett, MD  Palliative Care consult requested for this 69 y.o. female for goals of medical therapy in patient with multi-organ disease processes. She is currently getting HD while on a stretcher as she has been unable to sit up for this and she currently still has NG tube feedings going. I was asked to bring up options including possible hospice options with pt's daughter, since the patient is not showing signs of a robust recovery from this hospitalization.     Today's events: Pt had an MBSS and she is at high risk for aspiration, but she is able to tolerate a pureed with honey thick liquids diet.  She must be fed. She has not been seen by PT today as far as I can tell.  I do not expect that she will have improvement in her ability to sit up for HD.  Daughter said (yesterday) that she had not heard of any pts needing to 'go out of state to a nursing facility' if they cannot sit up for HD.  Will ask soc wkr to discuss with daughter.  I plan to meet with daughter tomorrow, but do not expect her to opt for Hospice Home.  Daughter stated that "It would have been easier if she had just gone all at once --but now that she is still here, it is hard to withdraw dialysis etc'.     IMPRESSION IMPRESSION: ESRD --now not sitting up for HD Acute hypoxic respiratory failure with CODE BLUE called  ---occurred 02/04/15 secondary to fistulogram procedure with angioedema Having occurred. 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 Moderate Malnutrition due to dementia   REVIEW OF SYSTEMS:  Patient is not able to provide ROS due to dementia and now anoxic brain injury due to recent code blue  CODE STATUS: Full code   PAST MEDICAL HISTORY: Past Medical History  Diagnosis Date  . Stroke ([facility])   . MI (myocardial infarction) ([facility])   . Hearing impaired   . ESRD (end stage renal disease) ([facility])   . Hyperlipidemia   . Hypertension   . Diabetes mellitus without complication ([facility])   . Depression   . Coronary artery disease   . Dementia   . Secondary hyperparathyroidism ([facility])   . Dialysis patient [facility])     Monday, Wednesday and Friday.   . Chronic systolic CHF (congestive heart failure) ([facility])   . GERD (gastroesophageal reflux disease)   . Personal history of transient ischemic attack (TIA) and cerebral infarction without residual deficit   . Anemia   . Moderate aortic stenosis     a. echo 04/2014: 40-50%, moderate AS    PAST SURGICAL HISTORY:  Past Surgical History  Procedure Laterality Date  . Cataract extraction, bilateral    .  Wrist surgery    . Peripheral vascular catheterization Left 07/28/2014    Procedure: A/V Shuntogram/Fistulagram;  Surgeon: Renford DillsGregory G Schnier, MD;  Location: [facility];  Service: Cardiovascular;  Laterality: Left;  . Peripheral vascular catheterization Left 07/28/2014    Procedure: A/V Shunt Intervention;  Surgeon: Renford DillsGregory G Schnier, MD;  Location: [facility];  Service: Cardiovascular;  Laterality: Left;  . Peripheral vascular catheterization Left 02/04/2015     Procedure: A/V Shuntogram/Fistulagram;  Surgeon: Annice NeedyJason S Dew, MD;  Location: [facility];  Service: Cardiovascular;  Laterality: Left;  . Peripheral vascular catheterization Left 02/04/2015    Procedure: A/V Shunt Intervention;  Surgeon: Annice NeedyJason S Dew, MD;  Location: [facility];  Service: Cardiovascular;  Laterality: Left;    Vital Signs: BP 134/58 mmHg  Pulse 61  Temp(Src) 97.9 F (36.6 C) (Oral)  Resp 16  Ht 5\' 6"  (1.676 m)  Wt 65 kg (143 lb 4.8 oz)  BMI 23.14 kg/m2  SpO2 99% Filed Weights   02/16/15 0505 02/17/15 0500 02/17/15 1345  Weight: 63.368 kg (139 lb 11.2 oz) 64.774 kg (142 lb 12.8 oz) 65 kg (143 lb 4.8 oz)    Estimated body mass index is 23.14 kg/(m^2) as calculated from the following:   Height as of this encounter: 5\' 6"  (1.676 m).   Weight as of this encounter: 65 kg (143 lb 4.8 oz).  PHYSICAL EXAM: NAD Lying on stretcher for HD Hrt rrr no m Lungs cta ant Skin warm and dry Does not respond to me  LABS: CBC:    Component Value Date/Time   WBC 4.2 02/13/2015 0553   WBC 4.1 04/24/2014 1037   HGB 12.1 02/13/2015 0553   HGB 8.3* 04/24/2014 1037   HCT 37.4 02/13/2015 0553   HCT 25.1* 04/24/2014 1037   PLT 168 02/17/2015 0458   PLT 123* 04/24/2014 1037   MCV 87.4 02/13/2015 0553   MCV 90 04/24/2014 1037   NEUTROABS 2.9 12/02/2014 1428   NEUTROABS 2.8 04/24/2014 1037   LYMPHSABS 0.6* 12/02/2014 1428   LYMPHSABS 0.7* 04/24/2014 1037   MONOABS 0.5 12/02/2014 1428   MONOABS 0.4 04/24/2014 1037   EOSABS 0.1 12/02/2014 1428   EOSABS 0.2 04/24/2014 1037   BASOSABS 0.1 12/02/2014 1428   BASOSABS 0.0 04/24/2014 1037   Comprehensive Metabolic Panel:    Component Value Date/Time   NA 136 02/13/2015 0553   NA 133* 04/24/2014 1037   K 4.3 02/13/2015 0553   K 4.5 04/24/2014 1037   CL 95* 02/13/2015 0553   CL 93* 04/24/2014 1037   CO2 32 02/13/2015 0553   CO2 30 04/24/2014 1037   BUN 45* 02/13/2015 0553   BUN 48* 04/24/2014 1037    CREATININE 5.50* 02/13/2015 0553   CREATININE 6.12* 04/24/2014 1037   GLUCOSE 162* 02/13/2015 0553   GLUCOSE 229* 04/24/2014 1037   CALCIUM 8.7* 02/13/2015 0553   CALCIUM 8.1* 04/24/2014 1037   AST 23 02/09/2015 0644   AST 79* 07/16/2013 1750   ALT 19 02/09/2015 0644   ALT 24 07/16/2013 1750   ALKPHOS 56 02/09/2015 0644   ALKPHOS 107 07/16/2013 1750   BILITOT 0.8 02/09/2015 0644   BILITOT 0.4 07/16/2013 1750   PROT 6.1* 02/09/2015 0644   PROT 8.1 07/16/2013 1750   ALBUMIN 2.7* 02/12/2015 1707   ALBUMIN 2.7* 04/24/2014 1037    Time Spent:  25 min

## 2015-02-17 NOTE — Progress Notes (Signed)
Post hd tx 

## 2015-02-17 NOTE — Progress Notes (Signed)
Hd tx start  

## 2015-02-17 NOTE — Clinical Social Work Note (Signed)
Also of note, PT did attempt to work with patient yesterday afternoon and documented that patient is unable to respond verbally and unable to participate in PT at this time. [place] SpanielMonica Tyller Bowlby MSW,[name] [phone]

## 2015-02-17 NOTE — Clinical Social Work Note (Signed)
CSW spoke with patient's daughter, Ms. Love, this morning via phone to inform her of that this CSW would be out of town and that SYSCOara Mitchell, [name] would be covering patient. Tara's number given to Ms. Love. Ms. Sandria ManlyLove expressed appreciation for CSW communicating the above. CSW will continue to follow. [place] SpanielMonica Corrin Hingle MSW,[name] [phone]

## 2015-02-17 NOTE — Progress Notes (Signed)
PT Cancellation Note  Patient Details Name: [name] MRN: [id number] DOB: 10/03/1945   Cancelled Treatment:    Reason Eval/Treat Not Completed: Patient at procedure or test/unavailable. Pt in hemodialysis. Re attempt treatment tomorrow.    Elsie StainHeidi Elizabeth Bishop 02/17/2015, 4:04 PM

## 2015-02-17 NOTE — Progress Notes (Signed)
[facility]  ROUNDING NOTE   Subjective:  Patient seen at bedside. Not participating with PT. Family considering palliative care. Can't perform HD sitting up at present.    Objective:  Vital signs in last 24 hours:  Temp:  [97.8 F (36.6 C)-98.8 F (37.1 C)] 98 F (36.7 C) (11/30 0413) Pulse Rate:  [62-73] 73 (11/30 0413) Resp:  [16-20] 17 (11/30 0413) BP: (109-124)/(52-61) 121/61 mmHg (11/30 0413) SpO2:  [98 %-100 %] 100 % (11/30 0413) Weight:  [64.774 kg (142 lb 12.8 oz)] 64.774 kg (142 lb 12.8 oz) (11/30 0500)  Weight change: 2.174 kg (4 lb 12.7 oz) Filed Weights   02/15/15 1345 02/16/15 0505 02/17/15 0500  Weight: 62.6 kg (138 lb 0.1 oz) 63.368 kg (139 lb 11.2 oz) 64.774 kg (142 lb 12.8 oz)    Intake/Output: I/O last 3 completed shifts: In: 1423 [NG/GT:1423] Out: 0    Intake/Output this shift:     Physical Exam: General: no acute distress   Head: Normocephalic difficult to assess hearing  Eyes:/ENT Anicteric, moist membranes  Neck: Supple, trachea midline  Lungs:  Normal effort, clear ant/lat  Heart: S1S2 2/6 SEM  Abdomen:  Soft, nontender, BS present    Extremities:  trace peripheral edema. Soft restraints on hands  Neuro: Awake, but not conversant, will follow simple commands  Skin: No lesions  Access: LUE AVG    Basic Metabolic Panel:  Recent Labs Lab 02/10/15 2033 02/12/15 1707 02/13/15 0553 02/15/15 2247  NA  --  134* 136  --   K  --  4.7 4.3  --   CL  --  93* 95*  --   CO2  --  30 32  --   GLUCOSE  --  168* 162*  --   BUN  --  73* 45*  --   CREATININE  --  7.56* 5.50*  --   CALCIUM  --  8.9 8.7*  --   MG 1.9  --   --   --   PHOS  --  3.6  --  3.6    Liver Function Tests:  Recent Labs Lab 02/12/15 1707  ALBUMIN 2.7*   No results for input(s): LIPASE, AMYLASE in the last 168 hours. No results for input(s): AMMONIA in the last 168 hours.  CBC:  Recent Labs Lab 02/12/15 1707 02/13/15 0553 02/17/15 0458  WBC 5.6  4.2  --   HGB 12.0 12.1  --   HCT 36.6 37.4  --   MCV 87.1 87.4  --   PLT 187 178 168    Cardiac Enzymes:  Recent Labs Lab 02/10/15 1423 02/10/15 1936 02/11/15 0145  TROPONINI 3.26* 3.17* 2.52*    BNP: Invalid input(s): POCBNP  CBG:  Recent Labs Lab 02/16/15 1635 02/16/15 2030 02/17/15 0102 02/17/15 0413 02/17/15 0728  GLUCAP 136* 110* 194* 179* 178*    Microbiology: Results for orders placed or performed during the hospital encounter of 02/04/15  MRSA PCR Screening     Status: None   Collection Time: 02/04/15  2:05 PM  Result Value Ref Range Status   MRSA by PCR NEGATIVE NEGATIVE Final    Comment:        The GeneXpert MRSA Assay (FDA approved for NASAL specimens only), is one component of a comprehensive MRSA colonization surveillance program. It is not intended to diagnose MRSA infection nor to guide or monitor treatment for MRSA infections.     Coagulation Studies: No results for input(s): LABPROT, INR in the  last 72 hours.  Urinalysis: No results for input(s): COLORURINE, LABSPEC, PHURINE, GLUCOSEU, HGBUR, BILIRUBINUR, KETONESUR, PROTEINUR, UROBILINOGEN, NITRITE, LEUKOCYTESUR in the last 72 hours.  Invalid input(s): APPERANCEUR    Imaging: No results found.    Medications:   . feeding supplement (NEPRO CARB STEADY) 1,000 mL (02/16/15 2035)   . antiseptic oral rinse  7 mL Mouth Rinse QID  . aspirin  81 mg Per Tube Daily  . atorvastatin  40 mg Per Tube q1800  . calcium acetate (Phos Binder)  1,334 mg Per Tube TID  . chlorhexidine gluconate  15 mL Mouth Rinse BID  . clopidogrel  75 mg Per Tube Daily  . free water  100 mL Per Tube 3 times per day  . heparin subcutaneous  5,000 Units Subcutaneous Q12H  . hydrALAZINE  50 mg Per Tube 3 times per day  . Influenza vac split quadrivalent PF  0.5 mL Intramuscular Tomorrow-1000  . insulin aspart  0-15 Units Subcutaneous 6 times per day  . isosorbide dinitrate  10 mg Per Tube BID  . levETIRAcetam   500 mg Oral Daily   And  . levETIRAcetam  500 mg Oral Q M,W,F-1800  . metoprolol tartrate  12.5 mg Per Tube BID  . senna-docusate  1 tablet Per Tube BID  . vitamin C  500 mg Per Tube BID   acetaminophen **OR** acetaminophen, alteplase, fentaNYL (SUBLIMAZE) injection, heparin, lidocaine (PF), lidocaine-prilocaine, ondansetron, phenol  Assessment/ Plan:  69 y.o. female past medical history of end-stage renal disease, myocardial infarction, CVA, hyperlipidemia, hypertension, diabetes mellitus, depression, secondary hyperparathyroidism, dementia, GERD, anemia chronic kidney disease who presented for a fistulogram and ended up having acute respiratory failure and acute tongue swelling/angioedema.  1.  End-stage renal disease on hemodialysis. [facility] [address] Pt due for HD today, orders have been prepared.  As of now pt would have to perform stretcher dialysis and therefore could not go back to her outpt dialysis center.  Family deciding between continued aggressive care vs comfort care.   2.  Acute respiratory failure/angioedema.  improved -was the primary issue this admission which has significantly improved, pt doing well off the ventilator.  3.  Anemia chronic kidney disease.  epogen remains on hold, continue to follow CBC periodically.  4.  Secondary hyperparathyroidism. Continue phoslo and monitor phos periodically.  LOS: 13 [name] 11/30/201610:57 AM

## 2015-02-17 NOTE — Progress Notes (Signed)
Pre-hd tx 

## 2015-02-17 NOTE — Progress Notes (Signed)
HD tx complete 

## 2015-02-17 NOTE — Evaluation (Signed)
Objective Swallowing Evaluation: MBS-Modified Barium Swallow Study  Patient Details  Name: [name] MRN: [id number] Date of Birth: 1945-12-12  Today's Date: 02/17/2015 Time: SLP Start Time (ACUTE ONLY): 0900-SLP Stop Time (ACUTE ONLY): 1000 SLP Time Calculation (min) (ACUTE ONLY): 60 min  Past Medical History:  Past Medical History  Diagnosis Date  . Stroke (HCC)   . MI (myocardial infarction) (HCC)   . Hearing impaired   . ESRD (end stage renal disease) (HCC)   . Hyperlipidemia   . Hypertension   . Diabetes mellitus without complication (HCC)   . Depression   . Coronary artery disease   . Dementia   . Secondary hyperparathyroidism (HCC)   . Dialysis patient [facility])     Monday, Wednesday and Friday.   . Chronic systolic CHF (congestive heart failure) (HCC)   . GERD (gastroesophageal reflux disease)   . Personal history of transient ischemic attack (TIA) and cerebral infarction without residual deficit   . Anemia   . Moderate aortic stenosis     a. echo 04/2014: 40-50%, moderate AS   Past Surgical History:  Past Surgical History  Procedure Laterality Date  . Cataract extraction, bilateral    . Wrist surgery    . Peripheral vascular catheterization Left 07/28/2014    Procedure: A/V Shuntogram/Fistulagram;  Surgeon: Renford Dills, MD;  Location: [facility] INVASIVE CV LAB;  Service: Cardiovascular;  Laterality: Left;  . Peripheral vascular catheterization Left 07/28/2014    Procedure: A/V Shunt Intervention;  Surgeon: Renford Dills, MD;  Location: [facility] INVASIVE CV LAB;  Service: Cardiovascular;  Laterality: Left;  . Peripheral vascular catheterization Left 02/04/2015    Procedure: A/V Shuntogram/Fistulagram;  Surgeon: Annice Needy, MD;  Location: [facility] INVASIVE CV LAB;  Service: Cardiovascular;  Laterality: Left;  . Peripheral vascular catheterization Left 02/04/2015    Procedure: A/V Shunt Intervention;  Surgeon: Annice Needy, MD;  Location: [facility] INVASIVE CV LAB;  Service:  Cardiovascular;  Laterality: Left;   HPI: pt is a 69 y.o. female with a known history of stroke, coronary artery disease, end-stage renal disease on hemodialysis for 3 years, hyperlipidemia, hypertension, diabetes, secondary, chronic systolic CHF- was scheduled for a fistulogram today the procedure took little longer than expected as per the ER physician who had seen the patient after that but patient tolerated the procedure very well after that when she was in the recovery room she had some swelling on her neck and face and altered mental status with some respiratory distress so CODE BLUE was activated, and ER physician reached over there he found patient had pulse and her blood pressure was also recordable, but due to swelling she was suspected to compromise her airway and so intubated the patient and started on ventilator. Pt was extubated on 02/09/15. oral weakness w/ open mouth posture at rest. NG out currently from this AM per NSG. Pt was mostly nonverbal. Pt has been NPO receiving TFs via NG until this AM after s/s of dysphagia w/ aspiration s/s at BSE.   Subjective: pt awake, sitting in chair Chief complaint: dysphagia   Objective:  Radiological Procedure: A videoflouroscopic evaluation of oral-preparatory, reflex initiation, and pharyngeal phases of the swallow was performed; as well as a screening of the upper esophageal phase.  I. POSTURE: upright  II. VIEW: lateral  III. COMPENSATORY STRATEGIES: Honey liquids given by TSP only; time b/t trials for lingual sweeping and dry swallow to aid oral clearing.  IV. BOLUSES ADMINISTERED:  Thin Liquid: NT  Nectar-thick Liquid: 3  trials via TSP  Honey-thick Liquid: 8 trials via TSP  Puree: 4 trials via tsp(3/4 size)  Mechanical Soft: NT V. RESULTS OF EVALUATION: A. ORAL PREPARATORY PHASE: (The lips, tongue, and velum are observed for strength and coordination)       **Overall Severity Rating: MODERATE. Decreased bolus control; slower transfer w/  increased oral phase time overall w/ trials given; min.+ oral residue remaining which decreased w/ lingual sweeping and f/u swallow. Overall weaker management and clearing noted.    B. SWALLOW INITIATION/REFLEX: (The reflex is normal if "triggered" by the time the bolus reached the base of the tongue)  **Overall Severity Rating: SEVERE. Tsp trials of Honey and Nectar consistency liquids fully filled pyriform sinuses prior to the initiation of the swallow reflex; puree consistency trials spilled to the pyriform sinuses and moderately filled them prior to the initiation of the swallow reflex.  C. PHARYNGEAL PHASE: (Pharyngeal function is normal if the bolus shows rapid, smooth, and continuous transit through the pharynx and there is no pharyngeal residue after the swallow)  **Overall Severity Rating: MILD. Noted BOT residue from boluses(moreso puree); intermittent pharyngeal residue indicating decreased pharyngeal peristalsis, BOT weakness. F/u swallow aided reduction in the pharyngeal residue; alternating food/liquid trials aided as well.   D. LARYNGEAL PENETRATION: (Material entering into the laryngeal inlet/vestibule but not aspirated): none during this study E. ASPIRATION: none during this study F. ESOPHAGEAL PHASE: (Screening of the upper esophagus): none noted; limited assessment sec. to positioning  ASSESSMENT: Pt presented w/ severe Pharyngeal phase dysphagia c/b a severe delay in pharyngeal swallow initiation which greatly increases risk for aspiration to occur w/ po intake; suspect this is directly related to her declined Cognitive and medical status'. Noted the tsp trials of Honey and Nectar consistency liquids fully filled the pyriform sinuses prior to the initiation of the swallow reflex; puree consistency trials spilled to the pyriform sinuses and intermittently filled them prior to the initiation of the swallow reflex. Pharyngeal residue noted on BOT particularly indicating decreased  pharyngeal pressure and BOT strength. During the Oral phase, noted decreased bolus control and premature spillage of the liquids into the pharynx; overall increased oral phase time w/ po intake and for oral clearing of min. residue; this can lead to increased fatigue from the exertion of the task thus increasing risk for aspiration to occur w/ po's Due to this severity of the swallow delay and declined Cognitive and medical status' (weakness), as well as pt's reduced cough effort when asked to produce, pt presents w/ increased risk for aspiration w/ questionable ability to effectively protect, and clear, her airway form aspirated material. Pt would benefit from a dysphagia diet at this time w/ strict aspiration precautions and assistance feeding at meals to ensure f/u w/ strategies and precautions.   PLAN/RECOMMENDATIONS:  A. Diet: Dys. 1 w/ Honey consistency liquids  B. Swallowing Precautions: all Honey consistency liquids via TSP only; reduce distractions during meals; time b/t boluses to use a dry swallow; strict aspiration precautions  C. Recommended consultation to: Dietician for nutritional support  D. Therapy recommendations: ST services for dysphagia tx  E. Results and recommendations were discussed w/ pt; MD/NSG/ Dietician  CHL IP CLINICAL IMPRESSIONS 02/17/2015  Therapy Diagnosis Moderate oral phase dysphagia;Severe pharyngeal phase dysphagia  Clinical Impression --  Impact on safety and function Severe aspiration risk      CHL IP TREATMENT RECOMMENDATION 02/17/2015  Treatment Recommendations Therapy as outlined in treatment plan below     Prognosis 02/17/2015  Prognosis for  Safe Diet Advancement Guarded  Barriers to Reach Goals --  Barriers/Prognosis Comment --    CHL IP DIET RECOMMENDATION 02/17/2015  SLP Diet Recommendations Dysphagia 1 (Puree) solids;Honey thick liquids  Liquid Administration via Spoon  Medication Administration Crushed with puree  Compensations Minimize  environmental distractions;Slow rate;Small sips/bites;Follow solids with liquid  Postural Changes Seated upright at 90 degrees      CHL IP OTHER RECOMMENDATIONS 02/17/2015  Recommended Consults (No Data)  Oral Care Recommendations Oral care BID;Staff/trained caregiver to provide oral care  Other Recommendations Order thickener from pharmacy;Prohibited food (jello, ice cream, thin soups);Remove water pitcher      CHL IP FOLLOW UP RECOMMENDATIONS 02/17/2015  Follow up Recommendations Skilled Nursing facility      [facility]QU[facility] D[facility] 02/17/2015  Speech Therapy Frequency (ACUTE ONLY) min 3x week  Treatment Duration 1 week                Jerilynn SomKatherine Watson, MS, CCC-SLP  Watson,Katherine 02/17/2015, 2:10 PM

## 2015-02-17 NOTE — Clinical Social Work Note (Signed)
Palliative Care consult documentation noted by this CSW. Placement in a nursing home has been paused due to patient requiring stretcher dialysis and patient's daughter working with Palliative Care to decide upon aggressive care versus comfort care. Palliative Care will speak with daughter (after the agreed upon 2 days) and follow up to determine the next course of action.  [name] MSW,[name] [phone]

## 2015-02-17 NOTE — Progress Notes (Signed)
[facility] NAME: [name]    MR#:  [id number]  DATE OF BIRTH:  [birth year]  SUBJECTIVE:  CHIEF COMPLAINT:  No chief complaint on file. about same. Not participating with PT  REVIEW OF SYSTEMS:   ROS not answering many questions at this time  DRUG ALLERGIES:   Allergies  Allergen Reactions  . Contrast Media [Iodinated Diagnostic Agents] Anaphylaxis  . Oxygen Swelling and Rash    Capnography line oxygen tubing monitor    VITALS:  Blood pressure 121/61, pulse 73, temperature 98 F (36.7 C), temperature source Oral, resp. rate 17, height 5' 6"  (1.676 m), weight 64.774 kg (142 lb 12.8 oz), SpO2 100 %.  PHYSICAL EXAMINATION:  GENERAL:  69 y.o.-year-old patient, no acute distress HEENT: Head atraumatic, normocephalic. Lips and tongue much less swollen, extubated, does have copious oral secretions which I think is chronic for her NECK:  Supple, no jugular venous distention. No thyroid enlargement, no tenderness.  LUNGS: Normal breath sounds bilaterally, no wheezing, rales,rhonchi or crepitation. No use of accessory muscles of respiration. Short shallow respirations CARDIOVASCULAR: S1, S2 normal. No murmurs, rubs, or gallops.  ABDOMEN: Soft, nontender, nondistended. Bowel sounds present. No organomegaly or mass.  EXTREMITIES: No pedal edema, cyanosis, or clubbing.  NEUROLOGIC: Follows simple commands, Rt Hemiparesis LABORATORY PANEL:   CBC  Recent Labs Lab 02/13/15 0553 02/17/15 0458  WBC 4.2  --   HGB 12.1  --   HCT 37.4  --   PLT 178 168   ------------------------------------------------------------------------------------------------------------------  Chemistries   Recent Labs Lab 02/10/15 2033  02/13/15 0553  NA  --   < > 136  K  --   < > 4.3  CL  --   < > 95*  CO2  --   < > 32  GLUCOSE  --   < > 162*  BUN  --   < > 45*  CREATININE  --   < > 5.50*  CALCIUM  --   < > 8.7*  MG 1.9  --   --    < > = values in this interval not displayed. ------------------------------------------------------------------------------------------------------------------  Cardiac Enzymes  Recent Labs Lab 02/11/15 0145  TROPONINI 2.52*   ------------------------------------------------------------------------------------------------------------------  RADIOLOGY:  No results found.  EKG:   Orders placed or performed during the hospital encounter of 02/04/15  . EKG 12-Lead  . EKG 12-Lead    ASSESSMENT AND PLAN:   * NSTEMI: troponins 3.26, Cardio already on board. Recent echo showing EF 35-40%,  Has severe AS which may need surgery if she comes out of current illness and family/patient wishes to go that route - certainly will need cath beforehand.  * Angioedema with respiratory compromise: Improved, extubated 11/22 - Pulmonology following, stable since extubation  * altered mental status: Improving. 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 Is mobile. Is alert and mostly oriented. 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 Continue hemodialysis per them - getting it M-W-F  * Hypertension - Blood pressure well controlled at this  time. Continue metoprolol, isosorbide, hydralazine. Adjust as needed.  * Coronary artery disease - Continue aspirin Plavix and statin  * Nutrition: Cont NGT and TFs for now.  Not participating with PT, per ST - diet upgraded  palliative care met with family - and planning to have another meeting tomorrow or so.  She has 4 major organ dysfunction (Kidney, Heart  (severe AS and CAD/MI), Neuro/Hypoxic Brain injury and Lung injury from prolonged vent - She has very poor prognosis. In addition Patient currently is not sitting up for dialysis and this would a necessity as no local outpatient dialysis centers can accommodate stretcher dialysis. Another barrier is the dobhoff tube that patient has and that this is not managed by nursing home's either.   All the records are reviewed and case discussed with Care Management/Social Worker. Management plans discussed with the nursing and patient's daughter.  CODE STATUS: Full   TOTAL TIME TAKING CARE OF THIS PATIENT: 15 minutes.   Greater than 50% of time spent in care coordination and counseling.   POSSIBLE D/C this week, DEPENDING ON CLINICAL CONDITION.     [facility], Tashira Torre M.D on 02/17/2015 at 2:06 PM  Between 7am to 6pm - Pager - [phone]  After 6pm go to [email or web address] - password [email or web address] [facility]  [facility]  Office  [phone]  CC: Primary care physician; Amy Annamary Rummage, NP

## 2015-02-17 NOTE — Care Management Important Message (Signed)
Important Message  Patient Details  Name: [name] MRN: [id number] Date of Birth: 08/20/1945   Medicare Important Message Given:  Yes    Olegario MessierKathy A Tyress Loden 02/17/2015, 11:19 AM

## 2015-02-18 DIAGNOSIS — R000 Tachycardia, unspecified: Secondary | ICD-10-CM

## 2015-02-18 DIAGNOSIS — I69351 Hemiplegia and hemiparesis following cerebral infarction affecting right dominant side: Secondary | ICD-10-CM

## 2015-02-18 DIAGNOSIS — I248 Other forms of acute ischemic heart disease: Secondary | ICD-10-CM

## 2015-02-18 DIAGNOSIS — Z87892 Personal history of anaphylaxis: Secondary | ICD-10-CM

## 2015-02-18 DIAGNOSIS — G9341 Metabolic encephalopathy: Secondary | ICD-10-CM | POA: Insufficient documentation

## 2015-02-18 LAB — GLUCOSE, CAPILLARY
GLUCOSE-CAPILLARY: 122 mg/dL — AB (ref 65–99)
GLUCOSE-CAPILLARY: 173 mg/dL — AB (ref 65–99)
GLUCOSE-CAPILLARY: 188 mg/dL — AB (ref 65–99)
Glucose-Capillary: 112 mg/dL — ABNORMAL HIGH (ref 65–99)
Glucose-Capillary: 114 mg/dL — ABNORMAL HIGH (ref 65–99)
Glucose-Capillary: 168 mg/dL — ABNORMAL HIGH (ref 65–99)
Glucose-Capillary: 129 mg/dL — ABNORMAL HIGH (ref 65–99)

## 2015-02-18 NOTE — Progress Notes (Signed)
Physical Therapy Treatment Patient Details Name: [name] MRN: [id number] DOB: 02/06/1946 Today's Date: 02/18/2015    History of Present Illness presented to ER secondary to angioedema with airway compromise requiring intubation (11/17-11/22) after L UE fistulogram (due to thrombosed AV graft); admitted for acute respiratory failure with AMS, angioedema.  Hospital course also significant for possible seizure-like activity, managed with keppra.    PT Comments    Pt continues to demonstrate inability to participate with PT. Pt responds "yes" or nods head "yes" to all questions/comments. Pt demonstrates no active/active assisted movement throughout bilateral lower extremities. Pt may not be appropriate to continue with PT orders; PT to reassess next visit.   Follow Up Recommendations  SNF     Equipment Recommendations       Recommendations for Other Services       Precautions / Restrictions Restrictions Weight Bearing Restrictions: No    Mobility  Bed Mobility                  Transfers                    Ambulation/Gait                 Stairs            Wheelchair Mobility    Modified Rankin (Stroke Patients Only)       Balance                                    Cognition Arousal/Alertness: Awake/alert Behavior During Therapy: Flat affect Overall Cognitive Status: Impaired/Different from baseline Area of Impairment: Following commands       Following Commands:  (cannot follow commands, or unable to perform)            Exercises General Exercises - Lower Extremity Ankle Circles/Pumps: PROM;Both;10 reps;Supine Quad Sets:  (cannot follow/perform) Short Arc Quad: PROM;Both;10 reps;Supine Heel Slides: PROM;Both;10 reps;Supine Hip ABduction/ADduction: PROM;Both;10 reps;Supine    General Comments        Pertinent Vitals/Pain Pain Assessment: No/denies pain    Home Living                       Prior Function            PT Goals (current goals can now be found in the care plan section) Progress towards PT goals: Not progressing toward goals - comment    Frequency  Min 2X/week    PT Plan Current plan remains appropriate    Co-evaluation             End of Session   Activity Tolerance:  (flat affect) Patient left: in bed;with call bell/phone within reach;with bed alarm set;with SCD's reapplied     Time: [id number] PT Time Calculation (min) (ACUTE ONLY): 10 min  Charges:  $Therapeutic Exercise: 8-22 mins                    G Codes:      Kristeen MissHeidi Elizabeth Bishop 02/18/2015, 3:45 PM

## 2015-02-18 NOTE — Progress Notes (Signed)
   02/18/15 2018  Vitals  BP (!) 110/55 mmHg  Dr. Clint GuyHower notified to see if it was okay to give pt all 3 scheduled BP meds tonight. MD stated it was okay.

## 2015-02-18 NOTE — Progress Notes (Signed)
[facility]  ROUNDING NOTE   Subjective:  Patient had dialysis yesterday. Tolerated well. Currently much more awake and alert and interactive.    Objective:  Vital signs in last 24 hours:  Temp:  [97.7 F (36.5 C)-98.1 F (36.7 C)] 98.1 F (36.7 C) (12/01 0636) Pulse Rate:  [58-86] 86 (12/01 0958) Resp:  [13-20] 18 (12/01 0958) BP: (110-147)/(53-73) 110/60 mmHg (12/01 0958) SpO2:  [94 %-100 %] 98 % (12/01 0958) Weight:  [57.38 kg (126 lb 8 oz)-65 kg (143 lb 4.8 oz)] 57.38 kg (126 lb 8 oz) (12/01 0636)  Weight change: 0.226 kg (8 oz) Filed Weights   02/17/15 1345 02/17/15 1715 02/18/15 0636  Weight: 65 kg (143 lb 4.8 oz) 64 kg (141 lb 1.5 oz) 57.38 kg (126 lb 8 oz)    Intake/Output: I/O last 3 completed shifts: In: 1085 [NG/GT:1085] Out: 1000 [Other:1000]   Intake/Output this shift:     Physical Exam: General: no acute distress   Head: Normocephalic hearing intact  Eyes:/ENT Anicteric, moist membranes  Neck: Supple, trachea midline  Lungs:  Normal effort, clear ant/lat  Heart: S1S2 2/6 SEM  Abdomen:  Soft, nontender, BS present    Extremities:  trace peripheral edema.   Neuro: Awake, alert, conversant, will follow commands  Skin: No lesions  Access: LUE AVG    Basic Metabolic Panel:  Recent Labs Lab 02/12/15 1707 02/13/15 0553 02/15/15 2247 02/17/15 1521  NA 134* 136  --   --   K 4.7 4.3  --   --   CL 93* 95*  --   --   CO2 30 32  --   --   GLUCOSE 168* 162*  --   --   BUN 73* 45*  --   --   CREATININE 7.56* 5.50*  --   --   CALCIUM 8.9 8.7*  --   --   PHOS 3.6  --  3.6 3.4    Liver Function Tests:  Recent Labs Lab 02/12/15 1707  ALBUMIN 2.7*   No results for input(s): LIPASE, AMYLASE in the last 168 hours. No results for input(s): AMMONIA in the last 168 hours.  CBC:  Recent Labs Lab 02/12/15 1707 02/13/15 0553 02/17/15 0458  WBC 5.6 4.2  --   HGB 12.0 12.1  --   HCT 36.6 37.4  --   MCV 87.1 87.4  --   PLT 187 178  168    Cardiac Enzymes: No results for input(s): CKTOTAL, CKMB, CKMBINDEX, TROPONINI in the last 168 hours.  BNP: Invalid input(s): POCBNP  CBG:  Recent Labs Lab 02/17/15 1212 02/17/15 2033 02/18/15 0148 02/18/15 0635 02/18/15 0758  GLUCAP 124* 99 112* 114* 122*    Microbiology: Results for orders placed or performed during the hospital encounter of 02/04/15  MRSA PCR Screening     Status: None   Collection Time: 02/04/15  2:05 PM  Result Value Ref Range Status   MRSA by PCR NEGATIVE NEGATIVE Final    Comment:        The GeneXpert MRSA Assay (FDA approved for NASAL specimens only), is one component of a comprehensive MRSA colonization surveillance program. It is not intended to diagnose MRSA infection nor to guide or monitor treatment for MRSA infections.     Coagulation Studies: No results for input(s): LABPROT, INR in the last 72 hours.  Urinalysis: No results for input(s): COLORURINE, LABSPEC, PHURINE, GLUCOSEU, HGBUR, BILIRUBINUR, KETONESUR, PROTEINUR, UROBILINOGEN, NITRITE, LEUKOCYTESUR in the last 72 hours.  Invalid  input(s): APPERANCEUR    Imaging: No results found.    Medications:     . aspirin  81 mg Per Tube Daily  . calcium acetate (Phos Binder)  1,334 mg Oral TID WC  . clopidogrel  75 mg Per Tube Daily  . heparin subcutaneous  5,000 Units Subcutaneous Q12H  . hydrALAZINE  50 mg Oral 3 times per day  . Influenza vac split quadrivalent PF  0.5 mL Intramuscular Tomorrow-1000  . insulin aspart  0-15 Units Subcutaneous 6 times per day  . isosorbide dinitrate  10 mg Per Tube BID  . levETIRAcetam  500 mg Oral Daily   And  . levETIRAcetam  500 mg Oral Q M,W,F-1800  . metoprolol tartrate  12.5 mg Oral BID   acetaminophen **OR** acetaminophen, alteplase, bisacodyl, heparin, lidocaine (PF), lidocaine-prilocaine, ondansetron, prochlorperazine  Assessment/ Plan:  69 y.o. female past medical history of end-stage renal disease, myocardial  infarction, CVA, hyperlipidemia, hypertension, diabetes mellitus, depression, secondary hyperparathyroidism, dementia, GERD, anemia chronic kidney disease who presented for a fistulogram and ended up having acute respiratory failure and acute tongue swelling/angioedema.  1.  End-stage renal disease on hemodialysis. M-W-F. @ [facility] [address] Patient much more awake and alert at this point in time. We will continue dialysis on a Monday, Wednesday, Friday schedule. No indication for dialysis today.   2.  Acute respiratory failure/angioedema.  improved -Significantly improved since admission. Patient breathing comfortably.  3.  Anemia chronic kidney disease.  No recent hemoglobin noted. Consider follow-up CBC. Epogen has been on hold..  4.  Secondary hyperparathyroidism. Last phosphorus is well controlled at 3.4. Continue PhosLo.   LOS: 14 Sencere Symonette 12/1/201611:02 AM

## 2015-02-18 NOTE — Progress Notes (Signed)
Pt SR this am.Telemetry nurse notified primary nurse that pt had depressed ST waves. Vitals taken and Dr Sherryll BurgerShah notified. Stat EKG ordered. Primary nurse to continue to monitor.

## 2015-02-18 NOTE — Care Management (Signed)
Spoke with Dr Cherylann RatelLateef, patient more alert and awake today, hopeful can sit for dialysis tomorrow. CSW involved in care along with palliative care. Awaiting more information on goals of care from family at this time.

## 2015-02-18 NOTE — Progress Notes (Signed)
[facility] [place] NAME: [name]    MR#:  [id number]  DATE OF BIRTH:  Mar 04, 1946  SUBJECTIVE:  CHIEF COMPLAINT:  No chief complaint on file. seems better. Able to talk some today  REVIEW OF SYSTEMS:   ROS not answering many questions at this time  DRUG ALLERGIES:   Allergies  Allergen Reactions  . Contrast Media [Iodinated Diagnostic Agents] Anaphylaxis  . Oxygen Swelling and Rash    Capnography line oxygen tubing monitor    VITALS:  Blood pressure 110/60, pulse 86, temperature 98.1 F (36.7 C), temperature source Oral, resp. rate 18, height 5' 6"  (1.676 m), weight 57.38 kg (126 lb 8 oz), SpO2 98 %.  PHYSICAL EXAMINATION:  GENERAL:  69 y.o.-year-old patient, no acute distress HEENT: Head atraumatic, normocephalic. Lips and tongue much less swollen, extubated, does have copious oral secretions which I think is chronic for her NECK:  Supple, no jugular venous distention. No thyroid enlargement, no tenderness.  LUNGS: Normal breath sounds bilaterally, no wheezing, rales,rhonchi or crepitation. No use of accessory muscles of respiration. Short shallow respirations CARDIOVASCULAR: S1, S2 normal. No murmurs, rubs, or gallops.  ABDOMEN: Soft, nontender, nondistended. Bowel sounds present. No organomegaly or mass.  EXTREMITIES: No pedal edema, cyanosis, or clubbing.  NEUROLOGIC: Follows simple commands, Rt Hemiparesis LABORATORY PANEL:   CBC  Recent Labs Lab 02/13/15 0553 02/17/15 0458  WBC 4.2  --   HGB 12.1  --   HCT 37.4  --   PLT 178 168   ------------------------------------------------------------------------------------------------------------------  Chemistries   Recent Labs Lab 02/13/15 0553  NA 136  K 4.3  CL 95*  CO2 32  GLUCOSE 162*  BUN 45*  CREATININE 5.50*  CALCIUM 8.7*    ------------------------------------------------------------------------------------------------------------------  Cardiac Enzymes No results for input(s): TROPONINI in the last 168 hours. ------------------------------------------------------------------------------------------------------------------  RADIOLOGY:  No results found.  EKG:   Orders placed or performed during the hospital encounter of 02/04/15  . EKG 12-Lead  . EKG 12-Lead  . EKG 12-Lead  . EKG 12-Lead   ASSESSMENT AND PLAN:  * NSTEMI: troponins 3.26, Cardio already on board. Recent echo showing EF 35-40%,  Has severe AS which may need surgery if she comes out of current illness and family/patient wishes to go that route - certainly will need cath beforehand.  * Angioedema with respiratory compromise: Improved, extubated 11/22 - Pulmonology following, stable since extubation  * altered mental status: Improving. 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 Is mobile. Is alert and mostly oriented. 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 Continue hemodialysis per them - getting it M-W-F  * Hypertension - Blood pressure well controlled at this time. Continue metoprolol, isosorbide, hydralazine. Adjust as needed.  * Coronary artery disease - Continue aspirin Plavix and statin  * Nutrition: Dysphagia 1 with Honey thick  Not participating with PT, per ST - diet upgraded  palliative care met with family - and planning to have another meeting tomorrow or so.  All  the records are reviewed and case discussed with Care Management/Social Worker. Management plans discussed with the nursing and patient's daughter.  CODE STATUS: Full   TOTAL TIME TAKING CARE OF THIS PATIENT: 15 minutes.   Greater than 50% of time spent in care coordination and counseling.   POSSIBLE D/C this week, DEPENDING ON CLINICAL CONDITION.  May be a good candidate for LTAC. D/w CM  [facility], Skylur Fuston M.D on 02/18/2015 at 11:38 AM  Between 7am to 6pm - Pager - [phone]  After 6pm go to [email or web address] - password [email or web address] [facility]  [facility]  Office  [phone]  CC: Primary care physician; Amy Annamary Rummage, NP

## 2015-02-18 NOTE — Progress Notes (Signed)
Initial Nutrition Assessment       INTERVENTION:  Meals and snacks:Monitor intake Medical Nutrition Supplement Therapy: will add honey thick mightyshake and magic cup for added nutrition   NUTRITION DIAGNOSIS:   Inadequate oral intake related to acute illness as evidenced by NPO status.    GOAL:   Patient will meet greater than or equal to 90% of their needs    MONITOR:    (Energy Intake, Anthropometrics, Electrolyte/Renal Profile, Glucose Profile, Pulmonary)  REASON FOR ASSESSMENT:   Ventilator, Consult Enteral/tube feeding initiation and management  ASSESSMENT:      NG tube removed and diet advanced to dysphagia 1 honey thick liquids per SLP   Current Nutrition: pt reports ate a few bites of lunch today.  Unable to provide any other detail regarding intake   Gastrointestinal Profile: Last BM: 11/30   Scheduled Medications:  . aspirin  81 mg Per Tube Daily  . calcium acetate (Phos Binder)  1,334 mg Oral TID WC  . clopidogrel  75 mg Per Tube Daily  . heparin subcutaneous  5,000 Units Subcutaneous Q12H  . hydrALAZINE  50 mg Oral 3 times per day  . Influenza vac split quadrivalent PF  0.5 mL Intramuscular Tomorrow-1000  . insulin aspart  0-15 Units Subcutaneous 6 times per day  . isosorbide dinitrate  10 mg Per Tube BID  . levETIRAcetam  500 mg Oral Daily   And  . levETIRAcetam  500 mg Oral Q M,W,F-1800  . metoprolol tartrate  12.5 mg Oral BID       Electrolyte/Renal Profile and Glucose Profile:   Recent Labs Lab 02/12/15 1707 02/13/15 0553 02/15/15 2247 02/17/15 1521  NA 134* 136  --   --   K 4.7 4.3  --   --   CL 93* 95*  --   --   CO2 30 32  --   --   BUN 73* 45*  --   --   CREATININE 7.56* 5.50*  --   --   CALCIUM 8.9 8.7*  --   --   PHOS 3.6  --  3.6 3.4  GLUCOSE 168* 162*  --   --    Protein Profile:  Recent Labs Lab 02/12/15 1707  ALBUMIN 2.7*      Weight Trend since Admission: Filed Weights   02/17/15 1345 02/17/15 1715  02/18/15 0636  Weight: 143 lb 4.8 oz (65 kg) 141 lb 1.5 oz (64 kg) 126 lb 8 oz (57.38 kg)      Diet Order:  DIET - DYS 1 Room service appropriate?: Yes with Assist; Fluid consistency:: Honey Thick  Skin:  Reviewed, no issues   Height:   Ht Readings from Last 1 Encounters:  02/04/15 5\' 6"  (1.676 m)    Weight:   Wt Readings from Last 1 Encounters:  02/18/15 126 lb 8 oz (57.38 kg)    Ideal Body Weight:     BMI:  Body mass index is 20.43 kg/(m^2).  Estimated Nutritional Needs:   Kcal:  BEE 1126 kcals (IF 1.1-1.3, AF 1.2) [id number] kcals/d.   Protein:  (1.2-1.5 g/kg) 70-87 g/kg  Fluid:  1000ml + UOP  EDUCATION NEEDS:   No education needs identified at this time  MODERATE Care Level  Toby Breithaupt B. Freida BusmanAllen, RD, LDN [phone] (pager)

## 2015-02-18 NOTE — Progress Notes (Signed)
Palliative Medicine Inpatient Consult Follow Up Note   Name: [name] Date: 02/18/2015 MRN: [id number]  DOB: 1945-06-13  Referring Physician: Delfino Lovett, MD  Palliative Care consult requested for this 69 y.o. 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  She had been very slow to wake up but now is more alert and actually talking some. She is on a pureed with honey thickened liquid diet now.   DISCUSSIONS AND DECISIONS: 1. She remains full code status at this time.  This should be discussed again.  2.  I left a message with pt's daughter, [name].  I will await a return phone call.  The nurse for pt tells me daughter was here this am.  3.  Pt is now taking in some food and liquids when fed.  She also has regained some upper body strength and is using her hands to pull herself forward by grabbing onto the bedrails. She can lean forward on her own and maintains her upper body posture independently.    4.  I have talked with care mgr.  I imagine that the daughter will want pt in a SNF setting for rehab and she will want continued HD etc.  I would like to touch base with her about options available at the point in time when pt stops taking in enough oral sustenance to survive etc.  We had a good long talk about Hospice but daughter felt two days ago that she was not quite ready to stop treatments and dialysis as she hopes her mother still has some quality days ahead of her.            IMPRESSION: ESRD --able to help with her own positioning somewhat  Acute hypoxic respiratory failure with CODE BLUE called  ---occurred 02/04/15 secondary to fistulogram procedure with angioedema Having occurred. ---Intubated and ventilated for several days ---concern is for possible anoxic brain injury having occurred ---but she is  improving and talking some (not back to previous level of cognition or functioning however)  Dysphagia --now on dysphagia 1 (pureed) with Honey Thick liquids ---consumed 250 ml this am per data listed.  H/O MCA territory CVA ---residual was Right Hemiplegia and some aphasia ---she can actually grip with right hand though  ESRD ---on HD for 3 years Seizures --controlled Hyperparathyroidism  --secondary to ESRD Esstl HTN CAD Dyslipidemia DM2 Chronic Systolic CHF  Tachycardia --resolved Depression Anemia of chronic disease --high RDW noted in CBC Hearing Impaired Elevated troponin due to demand ischemia ---EF SEVERE AORTIC STENOSIS --per echo 11/21 Metabolic Encephalopathy --due to illness/ anoxia? Moderate Malnutrition due to dementia   CODE STATUS: Full code  --this has been discussed with daughter.    PAST MEDICAL HISTORY: Past Medical History  Diagnosis Date  . Stroke (HCC)   . MI (myocardial infarction) (HCC)   . Hearing impaired   . ESRD (end stage renal disease) (HCC)   . Hyperlipidemia   . Hypertension   . Diabetes mellitus without complication (HCC)   . Depression   . Coronary artery disease   . Dementia   . Secondary hyperparathyroidism (HCC)   . Dialysis patient [facility])     Monday, Wednesday and Friday.   . Chronic systolic CHF (congestive heart failure) (HCC)   . GERD (gastroesophageal reflux disease)   . Personal history of transient ischemic attack (TIA)  and cerebral infarction without residual deficit   . Anemia   . Moderate aortic stenosis     a. echo 04/2014: 40-50%, moderate AS    PAST SURGICAL HISTORY:  Past Surgical History  Procedure Laterality Date  . Cataract extraction, bilateral    . Wrist surgery    . Peripheral vascular catheterization Left 07/28/2014    Procedure: A/V Shuntogram/Fistulagram;  Surgeon: Charlene DillsGregory G Schnier, MD;  Location: [facility] INVASIVE CV LAB;  Service: Cardiovascular;  Laterality: Left;  . Peripheral vascular  catheterization Left 07/28/2014    Procedure: A/V Shunt Intervention;  Surgeon: Charlene DillsGregory G Schnier, MD;  Location: [facility] INVASIVE CV LAB;  Service: Cardiovascular;  Laterality: Left;  . Peripheral vascular catheterization Left 02/04/2015    Procedure: A/V Shuntogram/Fistulagram;  Surgeon: Charlene NeedyJason S Dew, MD;  Location: [facility] INVASIVE CV LAB;  Service: Cardiovascular;  Laterality: Left;  . Peripheral vascular catheterization Left 02/04/2015    Procedure: A/V Shunt Intervention;  Surgeon: Charlene NeedyJason S Dew, MD;  Location: [facility] INVASIVE CV LAB;  Service: Cardiovascular;  Laterality: Left;    Vital Signs: BP 126/67 mmHg  Pulse 73  Temp(Src) 98.6 F (37 C) (Oral)  Resp 17  Ht 5\' 6"  (1.676 m)  Wt 57.38 kg (126 lb 8 oz)  BMI 20.43 kg/m2  SpO2 99% Filed Weights   02/17/15 1345 02/17/15 1715 02/18/15 0636  Weight: 65 kg (143 lb 4.8 oz) 64 kg (141 lb 1.5 oz) 57.38 kg (126 lb 8 oz)    Estimated body mass index is 20.43 kg/(m^2) as calculated from the following:   Height as of this encounter: 5\' 6"  (1.676 m).   Weight as of this encounter: 57.38 kg (126 lb 8 oz).  PHYSICAL EXAM: More alert Talking some EOMI OP clear No JVD or TM Hrt rrr no mgr Lungs cta anteriorly  LABS: CBC:    Component Value Date/Time   WBC 4.2 02/13/2015 0553   WBC 4.1 04/24/2014 1037   HGB 12.1 02/13/2015 0553   HGB 8.3* 04/24/2014 1037   HCT 37.4 02/13/2015 0553   HCT 25.1* 04/24/2014 1037   PLT 168 02/17/2015 0458   PLT 123* 04/24/2014 1037   MCV 87.4 02/13/2015 0553   MCV 90 04/24/2014 1037   NEUTROABS 2.9 12/02/2014 1428   NEUTROABS 2.8 04/24/2014 1037   LYMPHSABS 0.6* 12/02/2014 1428   LYMPHSABS 0.7* 04/24/2014 1037   MONOABS 0.5 12/02/2014 1428   MONOABS 0.4 04/24/2014 1037   EOSABS 0.1 12/02/2014 1428   EOSABS 0.2 04/24/2014 1037   BASOSABS 0.1 12/02/2014 1428   BASOSABS 0.0 04/24/2014 1037   Comprehensive Metabolic Panel:    Component Value Date/Time   NA 136 02/13/2015 0553   NA 133* 04/24/2014  1037   K 4.3 02/13/2015 0553   K 4.5 04/24/2014 1037   CL 95* 02/13/2015 0553   CL 93* 04/24/2014 1037   CO2 32 02/13/2015 0553   CO2 30 04/24/2014 1037   BUN 45* 02/13/2015 0553   BUN 48* 04/24/2014 1037   CREATININE 5.50* 02/13/2015 0553   CREATININE 6.12* 04/24/2014 1037   GLUCOSE 162* 02/13/2015 0553   GLUCOSE 229* 04/24/2014 1037   CALCIUM 8.7* 02/13/2015 0553   CALCIUM 8.1* 04/24/2014 1037   AST 23 02/09/2015 0644   AST 79* 07/16/2013 1750   ALT 19 02/09/2015 0644   ALT 24 07/16/2013 1750   ALKPHOS 56 02/09/2015 0644   ALKPHOS 107 07/16/2013 1750   BILITOT 0.8 02/09/2015 0644   BILITOT 0.4 07/16/2013 1750   PROT  6.1* 02/09/2015 0644   PROT 8.1 07/16/2013 1750   ALBUMIN 2.7* 02/12/2015 1707   ALBUMIN 2.7* 04/24/2014 1037    More than 50% of the visit was spent in counseling/coordination of care: YES  Time Spent:  35 min

## 2015-02-18 NOTE — Progress Notes (Signed)
Patient: [name] / Admit Date: 02/04/2015 / Date of Encounter: 02/18/2015, 2:00 PM   Subjective: We are asked to see the patient again for inferior TWI, which are not new when compared to ECG on 12/21/2014.    Review of Systems: ROS   Objective: Telemetry: NSR, 60's Physical Exam: Blood pressure 126/67, pulse 73, temperature 98.6 F (37 C), temperature source Oral, resp. rate 17, height  (1.676 m), weight 126 lb 8 oz (57.38 kg), SpO2 99 %. Body mass index is 20.43 kg/(m^2). General: Well developed, well nourished, in no acute distress. Head: Normocephalic, atraumatic, sclera non-icteric, no xanthomas, nares are without discharge. Neck: Negative for carotid bruits. JVP not elevated. Lungs: Clear bilaterally to auscultation without wheezes, rales, or rhonchi. Breathing is unlabored. Heart: RRR S1 S2, IV/VI systolic murmurs, rubs, or gallops.  Abdomen: Soft, non-tender, non-distended with normoactive bowel sounds. No rebound/guarding. Extremities: No clubbing or cyanosis. No edema. Distal pedal pulses are 2+ and equal bilaterally. Neuro: Alert and oriented X 3. Moves all extremities spontaneously. Psych:  Responds to questions appropriately with a normal affect.   Intake/Output Summary (Last 24 hours) at 02/18/15 1400 Last data filed at 02/18/15 1100  Gross per 24 hour  Intake    250 ml  Output   1000 ml  Net   -750 ml    Inpatient Medications:  . aspirin  81 mg Per Tube Daily  . calcium acetate (Phos Binder)  1,334 mg Oral TID WC  . clopidogrel  75 mg Per Tube Daily  . heparin subcutaneous  5,000 Units Subcutaneous Q12H  . hydrALAZINE  50 mg Oral 3 times per day  . Influenza vac split quadrivalent PF  0.5 mL Intramuscular Tomorrow-1000  . insulin aspart  0-15 Units Subcutaneous 6 times per day  . isosorbide dinitrate  10 mg Per Tube BID  . levETIRAcetam  500 mg Oral Daily   And  . levETIRAcetam  500 mg Oral Q M,W,F-1800  . metoprolol tartrate  12.5 mg Oral  BID   Infusions:    Labs:  Recent Labs  02/15/15 2247 02/17/15 1521  PHOS 3.6 3.4   No results for input(s): AST, ALT, ALKPHOS, BILITOT, PROT, ALBUMIN in the last 72 hours.  Recent Labs  02/17/15 0458  PLT 168   No results for input(s): CKTOTAL, CKMB, TROPONINI in the last 72 hours. Invalid input(s): POCBNP No results for input(s): HGBA1C in the last 72 hours.   Weights: Filed Weights   02/17/15 1345 02/17/15 1715 02/18/15 0636  Weight: 143 lb 4.8 oz (65 kg) 141 lb 1.5 oz (64 kg) 126 lb 8 oz (57.38 kg)     Radiology/Studies:  Dg Chest 1 View  02/08/2015  CLINICAL DATA:  Shortness of breath. EXAM: CHEST 1 VIEW COMPARISON:  02/04/2015 . FINDINGS: Endotracheal tube and NG tube in stable position. Cardiomegaly with normal pulmonary vascularity. Low lung volumes with mild bibasilar atelectasis. Tiny right pleural effusion cannot be excluded. No pneumothorax. IMPRESSION: 1. Lines and tubes in stable position. 2. Low lung volumes with mild bibasilar subsegmental atelectasis. Tiny right pleural effusion cannot be excluded. Electronically Signed   By: Maisie Fus  Register   On: 02/08/2015 07:13   X-ray Chest Pa Or Ap  02/04/2015  CLINICAL DATA:  Postop cardiac catheterization, the patient coded while in special procedures EXAM: CHEST 1 VIEW COMPARISON:  Portable chest x-ray of 12/02/2014 FINDINGS: The tip of the endotracheal tube is approximately 2.1 cm above the carina. The lungs appear well aerated.  No infiltrate or effusion is seen. Cardiomegaly is stable. IMPRESSION: 1. Endotracheal tube tip 2.1 cm above the carina. 2. Improved aeration of the lungs. Electronically Signed   By: Dwyane DeePaul  Barry M.D.   On: 02/04/2015 13:26   Dg Abd 1 View  02/13/2015  CLINICAL DATA:  NG tube placement EXAM: ABDOMEN - 1 VIEW COMPARISON:  02/10/2015 FINDINGS: There is normal small bowel gas pattern. Moderate stool and gas noted in right colon. Some colonic stool noted descending colon. NG feeding tube with  tip in mid stomach. IMPRESSION: NG feeding tube with tip in mid stomach. Electronically Signed   By: Natasha MeadLiviu  Pop M.D.   On: 02/13/2015 17:04   Dg Abd 1 View  02/10/2015  CLINICAL DATA:  Enteric feeding tube placement. EXAM: ABDOMEN - 1 VIEW COMPARISON:  02/04/1959 FINDINGS: The metal tip of the enteric feeding tube projects in the left mid abdomen, within the mid to distal stomach. Normal bowel gas pattern. Vascular calcifications are noted along the aorta and its branch vessels. IMPRESSION: 1. Enteric feeding tube tip projects in the mid to distal stomach. Electronically Signed   By: Amie Portlandavid  Ormond M.D.   On: 02/10/2015 09:34   Dg Abd 1 View  02/04/2015  CLINICAL DATA:  NG tube placement. EXAM: ABDOMEN - 1 VIEW COMPARISON:  12/21/2014 FINDINGS: The NG tube tip is in the fundal/ body region of the stomach. The bowel gas pattern is unremarkable. External pacer paddles are noted over the chest. IMPRESSION: NG tube tip is in the fundal/ body region of the stomach. Electronically Signed   By: Rudie MeyerP.  Gallerani M.D.   On: 02/04/2015 14:33   Ct Head Wo Contrast  02/06/2015  CLINICAL DATA:  Coded after vascular procedure for thrombosed AV graft. Acute respiratory failure, end-stage renal disease on dialysis. History of stroke, hypertension, diabetes, dementia. EXAM: CT HEAD WITHOUT CONTRAST TECHNIQUE: Contiguous axial images were obtained from the base of the skull through the vertex without intravenous contrast. COMPARISON:  CT head December 02, 2014 FINDINGS: No intraparenchymal hemorrhage, mass effect, midline shift or acute large vascular territory infarcts. LEFT parietal encephalomalacia, LEFT occipital lobe encephalomalacia, unchanged. Old bilateral thalamus lacunar infarcts. Patchy to confluent supratentorial white matter hypodensities are unchanged. No abnormal extra-axial fluid collections. Nasogastric tube via LEFT nares . Severe calcific atherosclerosis of the carotid siphons. Imaged paranasal sinuses and  mastoid air cells are well aerated. Included ocular globes and orbital contents are normal. IMPRESSION: No acute intracranial process. 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 Electronically Signed   By: Awilda Metroourtnay  Bloomer M.D.   On: 02/06/2015 00:18   Dg Chest Port 1 View  02/09/2015  CLINICAL DATA:  Hypoxia EXAM: PORTABLE CHEST 1 VIEW COMPARISON:  February 08, 2015 FINDINGS: Endotracheal tube tip is 4.0 cm above the carina. Nasogastric tube tip and side port are below the diaphragm. No pneumothorax. There is linear atelectasis in the left mid lung and right lower lung zone regions. Lungs elsewhere clear. Heart is borderline enlarged with pulmonary vascularity within normal limits. No adenopathy. No bone lesions. There is atherosclerotic calcification in the aorta. IMPRESSION: Tube positions as described without pneumothorax. Areas of mild atelectatic change bilaterally. No frank edema or consolidation. Heart borderline enlarged. Electronically Signed   By: Bretta BangWilliam  Woodruff III M.D.   On: 02/09/2015 07:27     Assessment and Plan   1. Severe aortic stenosis: -Echo this admission  showing severe aortic valve stenosis, in setting of moderately depressed ejection fraction -In her current state, not a good candidate for aortic valve surgery, even TAVR -Need to have conversation with family concerning CODE STATUS, Palliative Medicine is on board and patient remains a full code if she has meaningful recovery, more communicative/cognitive improvement, could consider alternatives for her aortic valve -Currently, she remains not a good surgical candidate  2. Abnormal ECG: -Not new when compared to prior studies -Not a candidate for invasive studies at this time -Does not appear to have symptoms concerning for active angina, consider alternative etiologies as well including  neurological   -As above  3. CAD: -Moderately depressed ejection fraction concerning for underlying CAD -No recent cardiac catheterization -We will discuss goals of care with her daughter, no family available -Does not appear to be having symptoms concerning for active ischemia  4. Ischemic cardiomyopathy: -Volume managed by HD -Continue Lopressor and Isordil   5. Severe pulmonary hypertension right heart pressures at least 80 mmHg on echo Fluid managed by hemodialysis -Likely exacerbated by renal failure and severe aortic valve stenosis  6. ESRD: -Per Renal -With very high right-sided pressures on echocardiogram does not appear to be in distress  7. Elevated troponin: -Underlying CAD most likely also in the setting of prior respiratory distress -We'll need to discuss goals of care with her daughter, no plans for cardiac catheterization at this time   Valaria Good Pager: [phone] 02/18/2015, 2:00 PM

## 2015-02-19 LAB — CBC
HEMATOCRIT: 34.5 % — AB (ref 35.0–47.0)
Hemoglobin: 11.1 g/dL — ABNORMAL LOW (ref 12.0–16.0)
MCH: 28.1 pg (ref 26.0–34.0)
MCHC: 32.1 g/dL (ref 32.0–36.0)
MCV: 87.7 fL (ref 80.0–100.0)
PLATELETS: 187 10*3/uL (ref 150–440)
RBC: 3.93 MIL/uL (ref 3.80–5.20)
RDW: 22 % — AB (ref 11.5–14.5)
WBC: 4.5 10*3/uL (ref 3.6–11.0)

## 2015-02-19 LAB — GLUCOSE, CAPILLARY
GLUCOSE-CAPILLARY: 103 mg/dL — AB (ref 65–99)
GLUCOSE-CAPILLARY: 213 mg/dL — AB (ref 65–99)
GLUCOSE-CAPILLARY: 142 mg/dL — AB (ref 65–99)
Glucose-Capillary: 135 mg/dL — ABNORMAL HIGH (ref 65–99)
Glucose-Capillary: 105 mg/dL — ABNORMAL HIGH (ref 65–99)

## 2015-02-19 LAB — PHOSPHORUS: Phosphorus: 2.4 mg/dL — ABNORMAL LOW (ref 2.5–4.6)

## 2015-02-19 NOTE — Care Management Important Message (Signed)
Important Message  Patient Details  Name: [name] MRN: [id number] Date of Birth: 12/17/1945   Medicare Important Message Given:  Yes    Olegario MessierKathy A Oda Lansdowne 02/19/2015, 10:10 AM

## 2015-02-19 NOTE — Progress Notes (Signed)
[facility]  ROUNDING NOTE   Subjective:  Pt sitting up at bedside when seen today. Doing much better. More interactive. Due for HD today.    Objective:  Vital signs in last 24 hours:  Temp:  [97.3 F (36.3 C)-98.4 F (36.9 C)] 97.3 F (36.3 C) (12/02 1630) Pulse Rate:  [64-77] 67 (12/02 1630) Resp:  [17-20] 17 (12/02 1630) BP: (110-145)/(53-69) 145/67 mmHg (12/02 1630) SpO2:  [98 %-100 %] 100 % (12/02 0534) Weight:  [62.823 kg (138 lb 8 oz)] 62.823 kg (138 lb 8 oz) (12/02 0534)  Weight change: -2.177 kg (-4 lb 12.8 oz) Filed Weights   02/17/15 1715 02/18/15 0636 02/19/15 0534  Weight: 64 kg (141 lb 1.5 oz) 57.38 kg (126 lb 8 oz) 62.823 kg (138 lb 8 oz)    Intake/Output: I/O last 3 completed shifts: In: 568 [P.O.:568] Out: 0    Intake/Output this shift:  Total I/O In: 300 [P.O.:300] Out: 500 [Other:500]  Physical Exam: General: no acute distress   Head: Normocephalic hearing intact  Eyes:/ENT Anicteric, moist membranes  Neck: Supple, trachea midline  Lungs:  Normal effort, clear ant/lat  Heart: S1S2 2/6 SEM  Abdomen:  Soft, nontender, BS present    Extremities:  trace peripheral edema.   Neuro: Awake, alert, conversant, will follow commands  Skin: No lesions  Access: LUE AVG    Basic Metabolic Panel:  Recent Labs Lab 02/12/15 1707 02/13/15 0553 02/15/15 2247 02/17/15 1521 02/19/15 1330  NA 134* 136  --   --   --   K 4.7 4.3  --   --   --   CL 93* 95*  --   --   --   CO2 30 32  --   --   --   GLUCOSE 168* 162*  --   --   --   BUN 73* 45*  --   --   --   CREATININE 7.56* 5.50*  --   --   --   CALCIUM 8.9 8.7*  --   --   --   PHOS 3.6  --  3.6 3.4 2.4*    Liver Function Tests:  Recent Labs Lab 02/12/15 1707  ALBUMIN 2.7*   No results for input(s): LIPASE, AMYLASE in the last 168 hours. No results for input(s): AMMONIA in the last 168 hours.  CBC:  Recent Labs Lab 02/12/15 1707 02/13/15 0553 02/17/15 0458 02/19/15 1330   WBC 5.6 4.2  --  4.5  HGB 12.0 12.1  --  11.1*  HCT 36.6 37.4  --  34.5*  MCV 87.1 87.4  --  87.7  PLT 187 178 168 187    Cardiac Enzymes: No results for input(s): CKTOTAL, CKMB, CKMBINDEX, TROPONINI in the last 168 hours.  BNP: Invalid input(s): POCBNP  CBG:  Recent Labs Lab 02/18/15 2022 02/18/15 2342 02/19/15 0359 02/19/15 0830 02/19/15 1151  GLUCAP 188* 173* 103* 135* 213*    Microbiology: Results for orders placed or performed during the hospital encounter of 02/04/15  MRSA PCR Screening     Status: None   Collection Time: 02/04/15  2:05 PM  Result Value Ref Range Status   MRSA by PCR NEGATIVE NEGATIVE Final    Comment:        The GeneXpert MRSA Assay (FDA approved for NASAL specimens only), is one component of a comprehensive MRSA colonization surveillance program. It is not intended to diagnose MRSA infection nor to guide or monitor treatment for MRSA infections.  Coagulation Studies: No results for input(s): LABPROT, INR in the last 72 hours.  Urinalysis: No results for input(s): COLORURINE, LABSPEC, PHURINE, GLUCOSEU, HGBUR, BILIRUBINUR, KETONESUR, PROTEINUR, UROBILINOGEN, NITRITE, LEUKOCYTESUR in the last 72 hours.  Invalid input(s): APPERANCEUR    Imaging: No results found.    Medications:     . aspirin  81 mg Per Tube Daily  . calcium acetate (Phos Binder)  1,334 mg Oral TID WC  . clopidogrel  75 mg Per Tube Daily  . heparin subcutaneous  5,000 Units Subcutaneous Q12H  . hydrALAZINE  50 mg Oral 3 times per day  . Influenza vac split quadrivalent PF  0.5 mL Intramuscular Tomorrow-1000  . insulin aspart  0-15 Units Subcutaneous 6 times per day  . isosorbide dinitrate  10 mg Per Tube BID  . levETIRAcetam  500 mg Oral Daily   And  . levETIRAcetam  500 mg Oral Q M,W,F-1800  . metoprolol tartrate  12.5 mg Oral BID   acetaminophen **OR** acetaminophen, alteplase, bisacodyl, heparin, lidocaine (PF), lidocaine-prilocaine, ondansetron,  prochlorperazine  Assessment/ Plan:  69 y.o. female past medical history of end-stage renal disease, myocardial infarction, CVA, hyperlipidemia, hypertension, diabetes mellitus, depression, secondary hyperparathyroidism, dementia, GERD, anemia chronic kidney disease who presented for a fistulogram and ended up having acute respiratory failure and acute tongue swelling/angioedema.  1.  End-stage renal disease on hemodialysis. [facility] [address] Pt had HD today, will continue HD on MWF schedule, next planned HD on Monday.   2.  Acute respiratory failure/angioedema.  improved -respiratory status much improved from admission, angioedema has completely resolved.  3.  Anemia chronic kidney disease.  Hgb 11.1, epogen on hold.  4.  Secondary hyperparathyroidism. Phos 2.4 at the moment.  Continue phoslo.   LOS: 15 Obbie Lewallen 12/2/20165:00 PM

## 2015-02-19 NOTE — Plan of Care (Addendum)
Problem: Phase I Progression Outcomes Goal: Other Phase II Outcomes/Goals Outcome: Progressing Pt is alert but confused, does not appear to be in distress, denies pain, minimally verbal, up to chair for dialysis with 2 person assist, managed to remain in chair throughout dialysis session, on room air, nursing provided update to daughter via telephone, patient worked with physical therapy, resting in between care, uneventful shift.

## 2015-02-19 NOTE — Plan of Care (Signed)
Problem: Education: Goal: Knowledge of [facility] Education information/materials will improve Outcome: Not Progressing Pt is nonverbal      Problem: Health Behavior/Discharge Planning: Goal: Ability to manage health-related needs will improve Outcome: Not Progressing Pt is nonverbal   Problem: Activity: Goal: Risk for activity intolerance will decrease Outcome: Not Progressing chairfast-PT working with pt     Problem: Fluid Volume: Goal: Ability to maintain a balanced intake and output will improve Outcome: Not Progressing Pt is HD

## 2015-02-19 NOTE — Progress Notes (Signed)
[facility] [facility] [place] NAME: [name]    MR#:  [id number]  DATE OF BIRTH:  11-24-1945  SUBJECTIVE:  CHIEF COMPLAINT:  No chief complaint on file. about same, talks very minimal.  REVIEW OF SYSTEMS:   ROS not answering many questions at this time  DRUG ALLERGIES:   Allergies  Allergen Reactions  . Contrast Media [Iodinated Diagnostic Agents] Anaphylaxis  . Oxygen Swelling and Rash    Capnography line oxygen tubing monitor    VITALS:  Blood pressure 115/57, pulse 72, temperature 98.2 F (36.8 C), temperature source Oral, resp. rate 20, height 5' 6"  (1.676 m), weight 62.823 kg (138 lb 8 oz), SpO2 100 %.  PHYSICAL EXAMINATION:  GENERAL:  69 y.o.-year-old patient, no acute distress HEENT: Head atraumatic, normocephalic. Lips and tongue much less swollen, extubated, does have copious oral secretions which I think is chronic for her NECK:  Supple, no jugular venous distention. No thyroid enlargement, no tenderness.  LUNGS: Normal breath sounds bilaterally, no wheezing, rales,rhonchi or crepitation. No use of accessory muscles of respiration. Short shallow respirations CARDIOVASCULAR: S1, S2 normal. No murmurs, rubs, or gallops.  ABDOMEN: Soft, nontender, nondistended. Bowel sounds present. No organomegaly or mass.  EXTREMITIES: No pedal edema, cyanosis, or clubbing.  NEUROLOGIC: Follows simple commands, Rt Hemiparesis LABORATORY PANEL:   CBC  Recent Labs Lab 02/13/15 0553 02/17/15 0458  WBC 4.2  --   HGB 12.1  --   HCT 37.4  --   PLT 178 168   ------------------------------------------------------------------------------------------------------------------  Chemistries   Recent Labs Lab 02/13/15 0553  NA 136  K 4.3  CL 95*  CO2 32  GLUCOSE 162*  BUN 45*  CREATININE 5.50*  CALCIUM 8.7*   ASSESSMENT AND PLAN:  * Acute metabolic encephalopathy: altered mental status slowly Improving. Sluggish but following  simple commands - neurology feels that AMS likely postictal - CT head no acute changes, old L left MCA infarct unchanged -  changing Keppra to reduce dose of 500 mg daily &  500 mg after dialysis - can be stopped after 30 days if no further seizure per Neuro - At baseline patient lives with her daughter. Is mobile. Is alert and mostly oriented. 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 Recent echo showing EF 35-40%,  Has severe AS which may need surgery if she comes out of current illness and family/patient wishes to go that route - certainly will need cath beforehand.  * Angioedema with respiratory compromise: Improved, extubated 11/22 - Pulmonology following, stable since extubation  * ESRD on HD - Nephrology following. Continue hemodialysis per them - getting it M-W-F  * Hypertension - Blood pressure well controlled at this time. Continue metoprolol, isosorbide, hydralazine. Adjust as needed.  * Coronary artery disease - Continue aspirin Plavix and statin  * Nutrition: Dysphagia 1 with Honey thick  Not participating with PT, per ST - diet upgraded  palliative care met with family - Likely will go to SNF for rehab and family wants continued HD per last conversations with palliative care. Will be a difficult placement as she will need to be able to sit for HD (no outpt HD center who can offer her slot)   All the records are reviewed and case discussed with Care Management/Social Worker. Management  plans discussed with the nursing and patient's daughter.  CODE STATUS: Full   TOTAL TIME TAKING CARE OF THIS PATIENT: 15 minutes.   Greater than 50% of time spent in care coordination and counseling. (left message to her [name] @ [phone])  POSSIBLE D/C early next week, DEPENDING  ON CLINICAL CONDITION.  D/w CM  [facility], Adalae Baysinger M.D on 02/19/2015 at 2:27 PM  Between 7am to 6pm - Pager - [phone]  After 6pm go to [email or web address] - password [email or web address] [facility]  [facility]  Office  [phone]  CC: Primary care physician; Amy Annamary Rummage, NP

## 2015-02-19 NOTE — Progress Notes (Signed)
Physical Therapy Treatment Patient Details Name: [name] MRN: [id number] DOB: 11/22/1945 Today's Date: 02/19/2015    History of Present Illness presented to ER secondary to angioedema with airway compromise requiring intubation (11/17-11/22) after L UE fistulogram (due to thrombosed AV graft); admitted for acute respiratory failure with AMS, angioedema.  Hospital course also significant for possible seizure-like activity, managed with keppra.    PT Comments    Noted improvement in alertness, command following and overall participation this date.  Able to actively move all extremities on command and utilize bilat UEs to assist with self-feeding in unsupported sitting position (fluctuates from max to min assist) today.  Some appropriate verbalization noted throughout session. Continues with noted delay in processing and task initiation, but overall much improved from initial evaluation. Will continue with PT efforts as appropriate; continue to recommend STR upon discharge, though anticipate very slow recovery.   Follow Up Recommendations  SNF     Equipment Recommendations       Recommendations for Other Services       Precautions / Restrictions Precautions Precautions: Fall Precaution Comments: Honey/puree, No BP L UE Restrictions Weight Bearing Restrictions: No    Mobility  Bed Mobility Overal bed mobility: Needs Assistance Bed Mobility: Supine to Sit;Sit to Supine     Supine to sit: Max assist Sit to supine: Total assist   General bed mobility comments: able to initiate movement through bilat UEs and trunk; assist for LEs  Transfers                 General transfer comment: unsafe/unable  Ambulation/Gait             General Gait Details: unsafe/unable   Stairs            Wheelchair Mobility    Modified Rankin (Stroke Patients Only)       Balance Overall balance assessment: Needs assistance Sitting-balance support: No upper  extremity supported;Feet supported Sitting balance-Leahy Scale: Fair Sitting balance - Comments: fluctuates from max assist to periods of min assist with functional activity                            Cognition Arousal/Alertness: Awake/alert Behavior During Therapy: WFL for tasks assessed/performed (delayed processing and task initiation)                        Exercises Other Exercises Other Exercises: Unsupported sitting edge of bed x40 min for assist with breakfast.  Initially requiring max assist for postural control/balance, but improved to consistent mod assist (with brief periods of min assist) throughout activity.  Maintains very forward flexed, kyphotic posture, but does demonstrate activation/initiation of abdominals necessary for forward trunk flexion/weight shift for balance correction.  Actively utilizing bilat UEs on self-feed this date (able to manipulate utensils and hold cups, bring to mouth) with increased time; fatigues quickly.  Hand-over-hand to initiate task as fatigue increases, but able to complete task once assisted by therapist.  Good visual scanning of meal tray. Other Exercises: Active movement (on command) of bilat UEs, L > R LE noted this date.  UEs at least 3/5, L LE at least 3-/5, R LE 2-/5.    General Comments        Pertinent Vitals/Pain Pain Assessment: No/denies pain    Home Living                      Prior  Function            PT Goals (current goals can now be found in the care plan section) Acute Rehab PT Goals PT Goal Formulation: Patient unable to participate in goal setting Time For Goal Achievement: 02/24/15 Potential to Achieve Goals: Fair Progress towards PT goals: Progressing toward goals    Frequency  Min 2X/week    PT Plan Current plan remains appropriate    Co-evaluation             End of Session   Activity Tolerance: Patient tolerated treatment well Patient left: in chair;with call  bell/phone within reach;with bed alarm set     Time: 1610-9604 PT Time Calculation (min) (ACUTE ONLY): 54 min  Charges:  $Therapeutic Activity: 53-67 mins                    G Codes:       Varian Innes H. Manson Passey, PT, DPT, NCS 02/19/2015, 10:49 AM [phone]

## 2015-02-19 NOTE — Progress Notes (Addendum)
FSBS 105 per [place]

## 2015-02-20 LAB — GLUCOSE, CAPILLARY
GLUCOSE-CAPILLARY: 169 mg/dL — AB (ref 65–99)
Glucose-Capillary: 136 mg/dL — ABNORMAL HIGH (ref 65–99)
Glucose-Capillary: 175 mg/dL — ABNORMAL HIGH (ref 65–99)
Glucose-Capillary: 191 mg/dL — ABNORMAL HIGH (ref 65–99)
Glucose-Capillary: 238 mg/dL — ABNORMAL HIGH (ref 65–99)
Glucose-Capillary: 310 mg/dL — ABNORMAL HIGH (ref 65–99)
Glucose-Capillary: 90 mg/dL (ref 65–99)

## 2015-02-20 NOTE — Progress Notes (Signed)
Dr.Gouru notified of client low bp; orders to hold hydralazine and continue to monitor

## 2015-02-20 NOTE — Progress Notes (Signed)
[facility] [place] NAME: [name]    MR#:  [id number]  DATE OF BIRTH:  04-23-1945  SUBJECTIVE:  CHIEF COMPLAINT:  Pt is more alert and talking some, says " doing good " , no familyt bedside REVIEW OF SYSTEMS:   ROS not answering many questions at this time  DRUG ALLERGIES:   Allergies  Allergen Reactions  . Contrast Media [Iodinated Diagnostic Agents] Anaphylaxis  . Oxygen Swelling and Rash    Capnography line oxygen tubing monitor    VITALS:  Blood pressure 121/67, pulse 93, temperature 99.6 F (37.6 C), temperature source Oral, resp. rate 22, height _0  (1.676 m), weight 61.553 kg (135 lb 11.2 oz), SpO2 99 %.  PHYSICAL EXAMINATION:  GENERAL:  69 y.o.-year-old patient, no acute distress HEENT: Head atraumatic, normocephalic. Lips and tongue much less swollen, extubated, does have copious oral secretions which I think is chronic for her NECK:  Supple, no jugular venous distention. No thyroid enlargement, no tenderness.  LUNGS: Normal breath sounds bilaterally, no wheezing, rales,rhonchi or crepitation. No use of accessory muscles of respiration. Short shallow respirations CARDIOVASCULAR: S1, S2 normal. No murmurs, rubs, or gallops.  ABDOMEN: Soft, nontender, nondistended. Bowel sounds present. No organomegaly or mass.  EXTREMITIES: No pedal edema, cyanosis, or clubbing.  NEUROLOGIC: Follows simple commands, Rt Hemiparesis LABORATORY PANEL:   CBC  Recent Labs Lab 02/19/15 1330  WBC 4.5  HGB 11.1*  HCT 34.5*  PLT 187   ------------------------------------------------------------------------------------------------------------------  Chemistries  No results for input(s): NA, K, CL, CO2, GLUCOSE, BUN, CREATININE, CALCIUM, MG, AST, ALT, ALKPHOS, BILITOT in the last 168 hours.  Invalid input(s): GFRCGP ASSESSMENT AND PLAN:  * Acute metabolic encephalopathy: altered mental status  Slow clinical Improvment.  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 Is mobile. Is alert and mostly oriented. 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 Recent echo showing EF 35-40%,  Has severe AS which may need surgery if she comes out of current illness and family/patient wishes to go that route - certainly will need cath beforehand.  * Angioedema with respiratory compromise: Improved, extubated 11/22 - Pulmonology following, stable since extubation  * ESRD on HD - Nephrology following. Continue hemodialysis per them - getting it M-W-F  * Hypertension - Blood pressure well controlled at this time. Continue metoprolol, isosorbide, hydralazine. Adjust as needed.  * Coronary artery disease - Continue aspirin Plavix and statin  * Nutrition: Dysphagia 1 with Honey thick- tolerating per RN   Not participating with PT, per ST - diet upgraded  palliative care met with family - Likely will go to SNF for rehab and family wants continued HD per last conversations with palliative care. Will be a difficult placement as she will need to be able to sit for HD (no outpt HD center who can offer her slot)   All the records are reviewed and case discussed with Care Management/Social Worker. Management plans discussed with the nursing and patient's daughter.  CODE STATUS:  Full   TOTAL TIME TAKING CARE OF THIS PATIENT: 25 minutes.   Greater than 50% of time spent in care coordination and counseling. ( [name] @ [phone])  POSSIBLE D/C early next week, DEPENDING ON CLINICAL  CONDITION.  D/w Jearld Lesch  Nicholes Mango M.D on 02/20/2015 at 11:02 PM  Between 7am to 6pm - Pager - [phone]  After 6pm go to [email or web address] - password [email or web address] [facility]  [facility]  Office  [phone]  CC: Primary care physician; Amy Annamary Rummage, NP

## 2015-02-21 LAB — GLUCOSE, CAPILLARY
GLUCOSE-CAPILLARY: 140 mg/dL — AB (ref 65–99)
GLUCOSE-CAPILLARY: 127 mg/dL — AB (ref 65–99)
GLUCOSE-CAPILLARY: 158 mg/dL — AB (ref 65–99)
GLUCOSE-CAPILLARY: 165 mg/dL — AB (ref 65–99)
Glucose-Capillary: 148 mg/dL — ABNORMAL HIGH (ref 65–99)
Glucose-Capillary: 198 mg/dL — ABNORMAL HIGH (ref 65–99)

## 2015-02-21 NOTE — Progress Notes (Signed)
After multiple pages was able to get in touch with Dr. Amado CoeGouru for low BP, with patient having her BP drop yesterday. Per Dr. Amado CoeGouru okay to give metoprolol and Isosordil.

## 2015-02-21 NOTE — Progress Notes (Signed)
[facility]  ROUNDING NOTE   Subjective:  patient has been doing well over the past several days. She last had dialysis on Friday. Due for dialysis again tomorrow.    Objective:  Vital signs in last 24 hours:  Temp:  [97.5 F (36.4 C)-99.6 F (37.6 C)] 98.2 F (36.8 C) (12/04 1355) Pulse Rate:  [76-108] 76 (12/04 1412) Resp:  [17-22] 17 (12/04 1355) BP: (86-121)/(41-67) 103/52 mmHg (12/04 1412) SpO2:  [96 %-99 %] 96 % (12/04 1355) Weight:  [59.829 kg (131 lb 14.4 oz)] 59.829 kg (131 lb 14.4 oz) (12/04 0500)  Weight change: -1.724 kg (-3 lb 12.8 oz) Filed Weights   02/20/15 0048 02/20/15 0216 02/21/15 0500  Weight: 61.553 kg (135 lb 11.2 oz) 61.553 kg (135 lb 11.2 oz) 59.829 kg (131 lb 14.4 oz)    Intake/Output: I/O last 3 completed shifts: In: 680 [P.O.:680] Out: 0    Intake/Output this shift:  Total I/O In: 540 [P.O.:540] Out: 0   Physical Exam: General: no acute distress   Head: Normocephalic hearing intact  Eyes:/ENT Anicteric, moist membranes  Neck: Supple, trachea midline  Lungs:  Normal effort, clear ant/lat  Heart: S1S2 2/6 SEM  Abdomen:  Soft, nontender, BS present    Extremities:  trace peripheral edema.   Neuro: Awake, alert, conversant, will follow commands  Skin: No lesions  Access: LUE AVG    Basic Metabolic Panel:  Recent Labs Lab 02/15/15 2247 02/17/15 1521 02/19/15 1330  PHOS 3.6 3.4 2.4*    Liver Function Tests: No results for input(s): AST, ALT, ALKPHOS, BILITOT, PROT, ALBUMIN in the last 168 hours. No results for input(s): LIPASE, AMYLASE in the last 168 hours. No results for input(s): AMMONIA in the last 168 hours.  CBC:  Recent Labs Lab 02/17/15 0458 02/19/15 1330  WBC  --  4.5  HGB  --  11.1*  HCT  --  34.5*  MCV  --  87.7  PLT 168 187    Cardiac Enzymes: No results for input(s): CKTOTAL, CKMB, CKMBINDEX, TROPONINI in the last 168 hours.  BNP: Invalid input(s): POCBNP  CBG:  Recent Labs Lab  02/20/15 2323 02/21/15 0140 02/21/15 0412 02/21/15 0740 02/21/15 1206  GLUCAP 175* 148* 140* 127* 198*    Microbiology: Results for orders placed or performed during the hospital encounter of 02/04/15  MRSA PCR Screening     Status: None   Collection Time: 02/04/15  2:05 PM  Result Value Ref Range Status   MRSA by PCR NEGATIVE NEGATIVE Final    Comment:        The GeneXpert MRSA Assay (FDA approved for NASAL specimens only), is one component of a comprehensive MRSA colonization surveillance program. It is not intended to diagnose MRSA infection nor to guide or monitor treatment for MRSA infections.     Coagulation Studies: No results for input(s): LABPROT, INR in the last 72 hours.  Urinalysis: No results for input(s): COLORURINE, LABSPEC, PHURINE, GLUCOSEU, HGBUR, BILIRUBINUR, KETONESUR, PROTEINUR, UROBILINOGEN, NITRITE, LEUKOCYTESUR in the last 72 hours.  Invalid input(s): APPERANCEUR    Imaging: No results found.    Medications:     . aspirin  81 mg Per Tube Daily  . calcium acetate (Phos Binder)  1,334 mg Oral TID WC  . clopidogrel  75 mg Per Tube Daily  . heparin subcutaneous  5,000 Units Subcutaneous Q12H  . hydrALAZINE  50 mg Oral 3 times per day  . Influenza vac split quadrivalent PF  0.5 mL Intramuscular Tomorrow-1000  .  insulin aspart  0-15 Units Subcutaneous 6 times per day  . isosorbide dinitrate  10 mg Per Tube BID  . levETIRAcetam  500 mg Oral Daily   And  . levETIRAcetam  500 mg Oral Q M,W,F-1800  . metoprolol tartrate  12.5 mg Oral BID   acetaminophen **OR** acetaminophen, alteplase, bisacodyl, heparin, lidocaine (PF), lidocaine-prilocaine, ondansetron, prochlorperazine  Assessment/ Plan:  69 y.o. female past medical history of end-stage renal disease, myocardial infarction, CVA, hyperlipidemia, hypertension, diabetes mellitus, depression, secondary hyperparathyroidism, dementia, GERD, anemia chronic kidney disease who presented for a  fistulogram and ended up having acute respiratory failure and acute tongue swelling/angioedema.  1.  End-stage renal disease on hemodialysis. M-W-F. @ [facility] [address] [facility] last had dialysis on Friday.  No acute indication for dialysis today.  We will plan for dialysis again tomorrow.   2.  Acute respiratory failure/angioedema.  improved -patient has had significant recovery.  Acute respiratory failure and angioedema have now resolved.  3.  Anemia chronic kidney disease.  Continue to hold Epogen.  Periodically monitor CBC.  4.  Secondary hyperparathyroidism. Last phosphorus was 2.4.  We will continue the patient on PhosLo.  Continue to periodically monitor bone mineral metabolism parameters.   LOS: 17 Savayah Waltrip 12/4/20163:02 PM

## 2015-02-21 NOTE — Progress Notes (Signed)
Notified Dr. Amado CoeGouru of low BP 103/52. Hold 2pm hydralazine. No new orders received.

## 2015-02-21 NOTE — Progress Notes (Signed)
[facility] [place] NAME: [place]    MR#:  [id number]  DATE OF BIRTH:  1946-02-25  SUBJECTIVE:  CHIEF COMPLAINT:  Pt is more alert and tolerating some food , as reported by thepatient was unable to sit in the chair for long time but trials were made today , no familyt bedside REVIEW OF SYSTEMS:   ROS not answering many questions at this time  DRUG ALLERGIES:   Allergies  Allergen Reactions  . Contrast Media [Iodinated Diagnostic Agents] Anaphylaxis  . Oxygen Swelling and Rash    Capnography line oxygen tubing monitor    VITALS:  Blood pressure 103/52, pulse 76, temperature 98.2 F (36.8 C), temperature source Oral, resp. rate 17, height 5' 6"  (1.676 m), weight 59.829 kg (131 lb 14.4 oz), SpO2 96 %.  PHYSICAL EXAMINATION:  GENERAL:  69 y.o.-year-old patient, no acute distress HEENT: Head atraumatic, normocephalic. Lips and tongue much less swollen, extubated NECK:  Supple, no jugular venous distention. No thyroid enlargement, no tenderness.  LUNGS: Normal breath sounds bilaterally, no wheezing, rales,rhonchi or crepitation. No use of accessory muscles of respiration. Short shallow respirations CARDIOVASCULAR: S1, S2 normal. No murmurs, rubs, or gallops.  ABDOMEN: Soft, nontender, nondistended. Bowel sounds present. No organomegaly or mass.  EXTREMITIES: No pedal edema, cyanosis, or clubbing.  NEUROLOGIC: Follows simple commands, Rt Hemiparesis LABORATORY PANEL:   CBC  Recent Labs Lab 02/19/15 1330  WBC 4.5  HGB 11.1*  HCT 34.5*  PLT 187   ------------------------------------------------------------------------------------------------------------------  Chemistries  No results for input(s): NA, K, CL, CO2, GLUCOSE, BUN, CREATININE, CALCIUM, MG, AST, ALT, ALKPHOS, BILITOT in the last 168 hours.  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 Is mobile, alert and mostly oriented. Does have Chronic  right sided weakness and some aphasia  * NSTEMI: troponins 3.26, Cardio already on board. Recent echo showing EF 35-40%,  Has severe AS which may need surgery if she comes out of current illness and family/patient wishes to go that route - certainly will need cath beforehand.  * Angioedema with respiratory compromise: thought to be from airway tubing   Improved, extubated 11/22 - Pulmonology following, stable since extubation  * ESRD on HD - Nephrology following. Continue hemodialysis per them - getting it M-W-F -Planning to dialyze the patient in a.m.  * Hypertension - Blood pressure well controlled at this time. Continue metoprolol, isosorbide, hydralazine. Adjust as needed.  * Coronary artery disease - Continue aspirin Plavix and statin  * Nutrition: Dysphagia 1 with Honey thick- tolerating per RN   Continue physical therapy, is recommending skilled nursing facility-  palliative care met with family - Likely will go to SNF for rehab and family wants continued HD per last conversations with palliative care. Will be a difficult placement as she will need to be able to sit for HD (no outpt HD center who can offer her slot)   All the records are reviewed and case discussed with Care Management/Social Worker. Management plans discussed with the nursing and patient's daughter.  CODE STATUS: Full   TOTAL TIME TAKING CARE OF THIS PATIENT:  25 minutes.   Greater than 50% of time spent in care coordination and counseling. ( [name] @ [phone])  POSSIBLE D/C early next week, DEPENDING ON CLINICAL CONDITION.  D/w Jearld Lesch  Nicholes Mango M.D on  02/21/2015 at 3:40 PM  Between 7am to 6pm - Pager - [phone]  After 6pm go to [email or web address] - password [email or web address] [facility]  [facility]  Office  [phone]  CC: Primary care physician; Amy Annamary Rummage, NP

## 2015-02-22 LAB — RENAL FUNCTION PANEL
Albumin: 2.9 g/dL — ABNORMAL LOW (ref 3.5–5.0)
Anion gap: 12 (ref 5–15)
BUN: 73 mg/dL — AB (ref 6–20)
CHLORIDE: 92 mmol/L — AB (ref 101–111)
CO2: 29 mmol/L (ref 22–32)
Calcium: 8.8 mg/dL — ABNORMAL LOW (ref 8.9–10.3)
Creatinine, Ser: 7.51 mg/dL — ABNORMAL HIGH (ref 0.44–1.00)
GFR calc Af Amer: 6 mL/min — ABNORMAL LOW (ref >60)
GFR, EST NON AFRICAN AMERICAN: 5 mL/min — AB (ref >60)
GLUCOSE: 229 mg/dL — AB (ref 65–99)
PHOSPHORUS: 1.7 mg/dL — AB (ref 2.5–4.6)
POTASSIUM: 4.9 mmol/L (ref 3.5–5.1)
Sodium: 133 mmol/L — ABNORMAL LOW (ref 135–145)

## 2015-02-22 LAB — CBC
HEMATOCRIT: 32.6 % — AB (ref 35.0–47.0)
HEMOGLOBIN: 10.5 g/dL — AB (ref 12.0–16.0)
MCH: 28.1 pg (ref 26.0–34.0)
MCHC: 32.2 g/dL (ref 32.0–36.0)
MCV: 87.1 fL (ref 80.0–100.0)
Platelets: 172 10*3/uL (ref 150–440)
RBC: 3.74 MIL/uL — AB (ref 3.80–5.20)
RDW: 21.4 % — ABNORMAL HIGH (ref 11.5–14.5)
WBC: 3.9 10*3/uL (ref 3.6–11.0)

## 2015-02-22 LAB — GLUCOSE, CAPILLARY
GLUCOSE-CAPILLARY: 112 mg/dL — AB (ref 65–99)
GLUCOSE-CAPILLARY: 128 mg/dL — AB (ref 65–99)
GLUCOSE-CAPILLARY: 138 mg/dL — AB (ref 65–99)
GLUCOSE-CAPILLARY: 242 mg/dL — AB (ref 65–99)
Glucose-Capillary: 137 mg/dL — ABNORMAL HIGH (ref 65–99)
Glucose-Capillary: 177 mg/dL — ABNORMAL HIGH (ref 65–99)

## 2015-02-22 NOTE — Care Management Important Message (Signed)
Important Message  Patient Details  Name: [name] MRN: [id number] Date of Birth: 04/12/1945   Medicare Important Message Given:  Yes    Olegario MessierKathy A Tieisha Darden 02/22/2015, 10:09 AM

## 2015-02-22 NOTE — Progress Notes (Signed)
Nutrition Follow-up     INTERVENTION:  Meals and snacks: Cater to pt preferences.   Medical Nutrition Supplement Therapy: continue supplements as ordered   NUTRITION DIAGNOSIS:   Inadequate oral intake related to acute illness as evidenced by NPO status, improved    GOAL:   Patient will meet greater than or equal to 90% of their needs  Meeting nutritional goals  MONITOR:    (Energy Intake, Anthropometrics, Electrolyte/Renal Profile, Glucose Profile, Pulmonary)  REASON FOR ASSESSMENT:   Ventilator, Consult Enteral/tube feeding initiation and management  ASSESSMENT:       Current Nutrition: eating 54% of meals per I and O sheet.  Spoke with RN, Emily this am and reports pt cleaned plate this am and tolerated well   Gastrointestinal Profile: Last BM: 12/02   Scheduled Medications:  . aspirin  81 mg Per Tube Daily  . calcium acetate (Phos Binder)  1,334 mg Oral TID WC  . clopidogrel  75 mg Per Tube Daily  . heparin subcutaneous  5,000 Units Subcutaneous Q12H  . hydrALAZINE  50 mg Oral 3 times per day  . Influenza vac split quadrivalent PF  0.5 mL Intramuscular Tomorrow-1000  . insulin aspart  0-15 Units Subcutaneous 6 times per day  . isosorbide dinitrate  10 mg Per Tube BID  . levETIRAcetam  500 mg Oral Daily   And  . levETIRAcetam  500 mg Oral Q M,W,F-1800  . metoprolol tartrate  12.5 mg Oral BID       Electrolyte/Renal Profile and Glucose Profile:   Recent Labs Lab 02/15/15 2247 02/17/15 1521 02/19/15 1330  PHOS 3.6 3.4 2.4*        Weight Trend since Admission: Filed Weights   02/22/15 0626 02/22/15 1131 02/22/15 1145  Weight: 136 lb (61.689 kg) 138 lb 14.2 oz (63 kg) 138 lb 14.2 oz (63 kg)      Diet Order:  DIET - DYS 1 Room service appropriate?: Yes with Assist; Fluid consistency:: Honey Thick  Skin:  Reviewed, no issues   Height:   Ht Readings from Last 1 Encounters:  02/04/15 5\' 6"  (1.676 m)    Weight:   Wt Readings from  Last 1 Encounters:  02/22/15 138 lb 14.2 oz (63 kg)    Ideal Body Weight:     BMI:  Body mass index is 22.43 kg/(m^2).  Estimated Nutritional Needs:   Kcal:  BEE 1126 kcals (IF 1.1-1.3, AF 1.2) 1487-1756 kcals/d.   Protein:  (1.2-1.5 g/kg) 70-87 g/kg  Fluid:  <MEASUREMKatrinka BlazElMarylaLyndaDurene CMeTomasKorPrudy [id number]<MEASUREMENKatrinka BlazElMarylanLyndaDurene CMeTomasKorPrudy [phone]<MEASUREMENKatrinka BlazElMarylandwinNew York-PresbyLyndaDurene CMeTomasKorPrudy [phone]<MEASUREMENKatrinka BlazLyndaDurene CMeTomasKorPrudy [phone]<MEASUREMENKatrinka BlazElMarylaLyndaDurene CMeTomasKorPrudy [phone]<MEASUREMEElMaryland<MEASUREMENKatrinka BlazElMarylandwinBannLyndaDurene CMeTomasKorPrudy FeelSouth [phone]<MEASUREMENKatrinka BlazElMarylandwinJ KenLyndaDurene CMeTomasKorPrudy [phone]<MEASUREMENKatrinka BlazElMarylandwLyndaDurene CMeTomasKorPrudy FeelK571 [id number]<MEASUREMENKatrinka BlazElMarylaLyndaDurene CMeTomasKorPrudy [phone]<MEASUREMENKatrinka BlazElMarylandwinHealthone RiLyndaDurene CMeTomasKorPrudy FeelTwin [phone]<MEASUREMENKatrinka BlazElMarylandwinAscension SeLyndaDurene CMeTomasKorPrudy [phone]<MEASUREMENKatrinka BlazElMarylandwinCheLyndaDurene CMeTomasKorPrudy FeelPenns [id number]<MEASUREMENKatrinka BlazElMarylandwiLyndaDurene CMeTomasKorPrudy [phone]<MEASUREMENKatrinka BlazElMarylandwinAllied LyndaDurene CMeTomasKorPrudy [id number]<MEASUREMENKatrinka BlazElMarylandwinChriLyndaDurene CMeTomasKorPrudy [phone]<MEASUREMENKatrinka BlazElMarylandwiLyndaDurene CMeTomasKorPrudy [phone]<MEASUREMENKatrinka BlazElMarylandwinLyndaDurene CMeTomasKorPrudy FeelMount [phone]<MEASUREMENKatrinka BlazElMarylandwinLyndaDurene CMeTomasKorPrudy FeelHorton [phone]<MEASUREMENKatrinka BlazElMarylandwinConroe Tx Endoscopy Asc LLC DbLyndaDurene CMeTomasKorPrudy [phone]<MEASUREMENKatrinka BlazElMarylandwinFonLyndaDurene CMeTomasKorPrudy FeelManistee [phone]<MEASUREMENKatrinka BlazElMarylandwinVillages EndoLyndaDurene CMeTomasKorPrudy [phone]<MEASUREMENKatrinka BlazElMarylandwiLyndaDurene CMeTomasKorPrudy FeelBay [phone]<MEASUREMENKatrinka BlazElMarylLyndaDurene CMeTomasKorPrudy FeelMcCl778-3587<MEASUREMENKatrinka BlazElMarylandwinCambridge HealthLyndaDurene CMeTomasKorPrudy FeelHeritage [phone]<MEASUREMENKatrinka BlazElMarylanLyndaDurene CMeTomasKorPrudy FeelDeckervi732267<MEASUREMENKatrinka BlazElMarylandwinEncompass Health RehabilitatioLyndaDurene CMeTomasKorPrudy [phone] [place]  No education needs identified at this time  LOW Care Level  Iven Earnhart B. Jarika Robben, RD, LDN [phone] (pager)

## 2015-02-22 NOTE — Progress Notes (Signed)
CSW followed up with [name] regarding dc plans. [name] would like Pt to go [facility]. CSW updated [facility] regarding plans after MD rounds today. CSW will follow up with family in person tomorrow.   Wilford Gristara Kimbree Casanas, [name] [phone]

## 2015-02-22 NOTE — Progress Notes (Signed)
[facility] [place] NAME: [name]    MR#:  [id number]  DATE OF BIRTH:  1945/04/13  SUBJECTIVE:  CHIEF COMPLAINT:  Pt had HD today, was able to get HD in sitting  Posture for some time per staff report  REVIEW OF SYSTEMS:   ROS not answering many questions at this time  DRUG ALLERGIES:   Allergies  Allergen Reactions  . Contrast Media [Iodinated Diagnostic Agents] Anaphylaxis  . Oxygen Swelling and Rash    Capnography line oxygen tubing monitor    VITALS:  Blood pressure 121/71, pulse 95, temperature 99.3 F (37.4 C), temperature source Oral, resp. rate 16, height 5' 6" (1.676 m), weight 63 kg (138 lb 14.2 oz), SpO2 99 %.  PHYSICAL EXAMINATION:  GENERAL:  69 y.o.-year-old patient, no acute distress HEENT: Head atraumatic, normocephalic. Lips and tongue much less swollen, extubated NECK:  Supple, no jugular venous distention. No thyroid enlargement, no tenderness.  LUNGS: Normal breath sounds bilaterally, no wheezing, rales,rhonchi or crepitation. No use of accessory muscles of respiration. Short shallow respirations CARDIOVASCULAR: S1, S2 normal. No murmurs, rubs, or gallops.  ABDOMEN: Soft, nontender, nondistended. Bowel sounds present. No organomegaly or mass.  EXTREMITIES: No pedal edema, cyanosis, or clubbing.  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 Is mobile, alert and mostly oriented. Does have Chronic  right sided weakness and some aphasia  * NSTEMI: troponins 3.26, Cardio already on board. Recent echo showing EF 35-40%,  Has severe AS which may need surgery if she comes out of current illness and family/patient wishes to go that route - certainly will need cath beforehand.  * Angioedema with respiratory compromise: thought to be from airway tubing   Improved, extubated 11/22 - Pulmonology following, stable since extubation  * ESRD on HD - Nephrology following. Continue hemodialysis per them - getting it M-W-F -Had HD today  * Hypertension - Blood pressure well controlled at this time. Continue metoprolol, isosorbide, hydralazine. Adjust as needed.  * Coronary artery disease - Continue aspirin Plavix and statin  * Nutrition: Dysphagia 1 with Honey thick- tolerating per RN   Continue physical therapy, is recommending skilled nursing facility-  palliative care met with family - Likely will go to SNF for rehab and family wants [facility] and continued HD per last conversations with palliative care.   All the records are reviewed and case discussed with Care Management/Social Worker. Management plans discussed with the nursing and patient's daughter.  CODE STATUS: Full   TOTAL TIME TAKING CARE OF THIS PATIENT: 25 minutes.   Greater than 50% of time spent in care coordination and counseling. ( [name] @ [phone])  POSSIBLE D/C early next week, DEPENDING ON CLINICAL CONDITION.  D/w  CM  ,  M.D on 02/22/2015 at 10:08 PM  Between 7am to 6pm - Pager - [phone]  After 6pm go to [email or web address] - password [email or web address]  [facility]  Office  [phone]  CC: Primary care physician; Amy L Krebs, NP 

## 2015-02-22 NOTE — Progress Notes (Signed)
PT Cancellation Note  Patient Details Name: [name] MRN: [id number] DOB: 08/01/1945   Cancelled Treatment:    Reason Eval/Treat Not Completed: Patient at procedure or test/unavailable (Will try later as time allows).   Ivar DrapeStout, Jibril Mcminn E 02/22/2015, 1:40 PM   Samul Dadauth Tamirah George, PT MS Acute Rehab Dept. Number: [facility] [id number] and [facility] [phone]

## 2015-02-22 NOTE — Progress Notes (Signed)
[facility]  ROUNDING NOTE   Subjective:   Seen and examined on hemodialysis.  UF goal 1 litre.     Objective:  Vital signs in last 24 hours:  Temp:  [98.2 F (36.8 C)-98.5 F (36.9 C)] 98.2 F (36.8 C) (12/05 0523) Pulse Rate:  [76-94] 85 (12/05 1131) Resp:  [17-22] 21 (12/05 1131) BP: (86-120)/(41-61) 111/59 mmHg (12/05 1131) SpO2:  [96 %-100 %] 100 % (12/05 0523) Weight:  [61.689 kg (136 lb)-63 kg (138 lb 14.2 oz)] 63 kg (138 lb 14.2 oz) (12/05 1131)  Weight change: 1.86 kg (4 lb 1.6 oz) Filed Weights   02/21/15 0500 02/22/15 0626 02/22/15 1131  Weight: 59.829 kg (131 lb 14.4 oz) 61.689 kg (136 lb) 63 kg (138 lb 14.2 oz)    Intake/Output: I/O last 3 completed shifts: In: 780 [P.O.:780] Out: 0    Intake/Output this shift:  Total I/O In: 240 [P.O.:240] Out: -   Physical Exam: General: no acute distress   Head: Normocephalic hearing intact  Eyes:/ENT Anicteric, moist membranes  Neck: Supple, trachea midline  Lungs:  Normal effort, clear ant/lat  Heart: S1S2 2/6 SEM  Abdomen:  Soft, nontender, BS present    Extremities:  trace peripheral edema.   Neuro: Awake, alert, conversant, will follow commands  Skin: No lesions  Access: LUE AVG    Basic Metabolic Panel:  Recent Labs Lab 02/15/15 2247 02/17/15 1521 02/19/15 1330  PHOS 3.6 3.4 2.4*    Liver Function Tests: No results for input(s): AST, ALT, ALKPHOS, BILITOT, PROT, ALBUMIN in the last 168 hours. No results for input(s): LIPASE, AMYLASE in the last 168 hours. No results for input(s): AMMONIA in the last 168 hours.  CBC:  Recent Labs Lab 02/17/15 0458 02/19/15 1330 02/22/15 0631  WBC  --  4.5 3.9  HGB  --  11.1* 10.5*  HCT  --  34.5* 32.6*  MCV  --  87.7 87.1  PLT 168 187 172    Cardiac Enzymes: No results for input(s): CKTOTAL, CKMB, CKMBINDEX, TROPONINI in the last 168 hours.  BNP: Invalid input(s): POCBNP  CBG:  Recent Labs Lab 02/21/15 1649 02/21/15 2015  02/22/15 0009 02/22/15 0507 02/22/15 0726  GLUCAP 158* 165* 128* 112* 138*    Microbiology: Results for orders placed or performed during the hospital encounter of 02/04/15  MRSA PCR Screening     Status: None   Collection Time: 02/04/15  2:05 PM  Result Value Ref Range Status   MRSA by PCR NEGATIVE NEGATIVE Final    Comment:        The GeneXpert MRSA Assay (FDA approved for NASAL specimens only), is one component of a comprehensive MRSA colonization surveillance program. It is not intended to diagnose MRSA infection nor to guide or monitor treatment for MRSA infections.     Coagulation Studies: No results for input(s): LABPROT, INR in the last 72 hours.  Urinalysis: No results for input(s): COLORURINE, LABSPEC, PHURINE, GLUCOSEU, HGBUR, BILIRUBINUR, KETONESUR, PROTEINUR, UROBILINOGEN, NITRITE, LEUKOCYTESUR in the last 72 hours.  Invalid input(s): APPERANCEUR    Imaging: No results found.    Medications:     . aspirin  81 mg Per Tube Daily  . calcium acetate (Phos Binder)  1,334 mg Oral TID WC  . clopidogrel  75 mg Per Tube Daily  . heparin subcutaneous  5,000 Units Subcutaneous Q12H  . hydrALAZINE  50 mg Oral 3 times per day  . Influenza vac split quadrivalent PF  0.5 mL Intramuscular Tomorrow-1000  .  insulin aspart  0-15 Units Subcutaneous 6 times per day  . isosorbide dinitrate  10 mg Per Tube BID  . levETIRAcetam  500 mg Oral Daily   And  . levETIRAcetam  500 mg Oral Q M,W,F-1800  . metoprolol tartrate  12.5 mg Oral BID   acetaminophen **OR** acetaminophen, alteplase, bisacodyl, heparin, lidocaine (PF), lidocaine-prilocaine, ondansetron, prochlorperazine  Assessment/ Plan:  69 y.o. female past medical history of end-stage renal disease, myocardial infarction, CVA, hyperlipidemia, hypertension, diabetes mellitus, depression, secondary hyperparathyroidism, dementia, GERD, anemia chronic kidney disease who presented for a fistulogram and ended up having  acute respiratory failure and acute tongue swelling/angioedema.  [facility]phrology MWF [facility] [address]   1.  End-stage renal disease on hemodialysis. Seen and examined on hemodialysis. Tolerating treatment well.  - Continue MWF schedule.  - AVG   2.  Acute respiratory failure/angioedema.  Improved. Secondary to oxygen tubing?  - continue supportive care. Treated with methylprednisolone.   3.  Anemia chronic kidney disease. Hemoglobin  Continue to hold Epogen.   4.  Secondary hyperparathyroidism. Phos 2.4 - continue calcium acetate  5. Hypertension: blood pressure at goal.  - metoprolol, isosorbide dinitrate, hydralazine  6. Diabetes mellitus type II with chronic kidney disease Continue glucose control.    LOS: 18 Jacklin Zwick 12/5/201611:46 AM

## 2015-02-23 ENCOUNTER — Ambulatory Visit: Payer: Self-pay | Admitting: Family Medicine

## 2015-02-23 DIAGNOSIS — T8241XA Breakdown (mechanical) of vascular dialysis catheter, initial encounter: Secondary | ICD-10-CM | POA: Diagnosis not present

## 2015-02-23 LAB — GLUCOSE, CAPILLARY
Glucose-Capillary: 103 mg/dL — ABNORMAL HIGH (ref 65–99)
Glucose-Capillary: 143 mg/dL — ABNORMAL HIGH (ref 65–99)
Glucose-Capillary: 214 mg/dL — ABNORMAL HIGH (ref 65–99)
Glucose-Capillary: 159 mg/dL — ABNORMAL HIGH (ref 65–99)

## 2015-02-23 MED ORDER — METOPROLOL TARTRATE 25 MG PO TABS: 12.5000 mg | ORAL_TABLET | Freq: Two times a day (BID) | ORAL | Status: DC

## 2015-02-23 MED ORDER — LEVETIRACETAM 100 MG/ML PO SOLN: 500.0000 mg | ORAL | Status: DC

## 2015-02-23 MED ORDER — INFLUENZA VAC SPLIT QUAD 0.5 ML IM SUSY: 0.5000 mL | PREFILLED_SYRINGE | INTRAMUSCULAR | Status: DC

## 2015-02-23 MED ORDER — BISACODYL 10 MG RE SUPP: 10.0000 mg | Freq: Every day | RECTAL | Status: AC | PRN

## 2015-02-23 MED ORDER — ISOSORBIDE DINITRATE 10 MG PO TABS: 10.0000 mg | ORAL_TABLET | Freq: Two times a day (BID) | ORAL | Status: DC

## 2015-02-23 MED ORDER — PNEUMOCOCCAL VAC POLYVALENT 25 MCG/0.5ML IJ INJ: 0.5000 mL | INJECTION | INTRAMUSCULAR | Status: DC

## 2015-02-23 MED ORDER — LEVETIRACETAM 100 MG/ML PO SOLN: 500.0000 mg | Freq: Every day | ORAL | Status: DC

## 2015-02-23 MED ORDER — HYDRALAZINE HCL 50 MG PO TABS: 50.0000 mg | ORAL_TABLET | Freq: Three times a day (TID) | ORAL | Status: DC

## 2015-02-23 MED ORDER — LIDOCAINE-PRILOCAINE 2.5-2.5 % EX CREA: 1.0000 "application " | TOPICAL_CREAM | CUTANEOUS | Status: DC | PRN

## 2015-02-23 MED ORDER — PNEUMOCOCCAL VAC POLYVALENT 25 MCG/0.5ML IJ INJ
0.5000 mL | INJECTION | Freq: Once | INTRAMUSCULAR | Status: AC
  Administered 2015-02-23: 0.5 mL via INTRAMUSCULAR
  Filled 2015-02-23 (×2): qty 0.5

## 2015-02-23 NOTE — Discharge Planning (Signed)
RN attempting to give report to [facility] was left on hold 15min. Will try again later.

## 2015-02-23 NOTE — Progress Notes (Signed)
Physical Therapy Treatment Patient Details Name: [name] MRN: [id number] DOB: 08/17/1945 Today's Date: 02/23/2015    History of Present Illness presented to ER secondary to angioedema with airway compromise requiring intubation (11/17-11/22) after L UE fistulogram (due to thrombosed AV graft); admitted for acute respiratory failure with AMS, angioedema.  Hospital course also significant for possible seizure-like activity, managed with keppra.    PT Comments    Remains alert and responsive to therapist (nodding yes/no), but no attempts at spontaneous verbalization this date.  Continues to require increased time for processing and task initiation.  Did progress to sit/stand attempts this date, requiring max assist +2 for physical assist and safety.  Patient spontaneously sitting after approx 5 seconds each trial; unable/unsafe to progress further.  Recommend total lift for any OOB attempts at this time.   Follow Up Recommendations  SNF     Equipment Recommendations       Recommendations for Other Services       Precautions / Restrictions Precautions Precautions: Fall Precaution Comments: Honey/puree, No BP L UE Restrictions Weight Bearing Restrictions: No    Mobility  Bed Mobility Overal bed mobility: Needs Assistance Bed Mobility: Supine to Sit;Sit to Supine     Supine to sit: Max assist Sit to supine: Max assist;+2 for physical assistance      Transfers Overall transfer level: Needs assistance Equipment used: Rolling walker (2 wheeled) Transfers: Sit to/from Stand Sit to Stand: Max assist;+2 physical assistance         General transfer comment: assist for forward weight shift, lift off and standing balance  Ambulation/Gait             General Gait Details: unsafe/unable   Stairs            Wheelchair Mobility    Modified Rankin (Stroke Patients Only)       Balance Overall balance assessment: Needs assistance Sitting-balance support:  No upper extremity supported;Feet supported Sitting balance-Leahy Scale: Poor Sitting balance - Comments: initially requiring max assist, improving to min assist with training and work on anterior weight shift     Standing balance-Leahy Scale: Poor                      Cognition Arousal/Alertness: Awake/alert Behavior During Therapy: Flat affect Overall Cognitive Status: Difficult to assess (nods head yes/no, but no attempts at verbalization this date)                      Exercises Other Exercises Other Exercises: Unsupported sitting, max to min assist with accommodation to position.  Maintains very forward flexed, kyphotic posture; constant assist for forward trunk lean. Other Exercises: Sit/stand with RW x3, max assist +2 for lift off, standing balance.  constant blocking R knee to prevent buckling; patient spontaneously sitting after approx 5 sec standing with each trial. Other Exercises: Rolling bilat, max assist +2; dep assist for incontinent bowel/bladder    General Comments        Pertinent Vitals/Pain Pain Assessment: Faces Faces Pain Scale: No hurt    Home Living                      Prior Function            PT Goals (current goals can now be found in the care plan section) Acute Rehab PT Goals PT Goal Formulation: Patient unable to participate in goal setting Time For Goal Achievement: 02/24/15 Potential to  Achieve Goals: Fair Progress towards PT goals: Progressing toward goals    Frequency  Min 2X/week    PT Plan Current plan remains appropriate    Co-evaluation             End of Session Equipment Utilized During Treatment: Gait belt Activity Tolerance: Patient tolerated treatment well Patient left: in bed;with call bell/phone within reach;with bed alarm set     Time: 1023-1050 PT Time Calculation (min) (ACUTE ONLY): 27 min  Charges:  $Therapeutic Activity: 23-37 mins                    G Codes:      [name], PT, DPT, NCS 02/23/2015, 11:42 AM [phone]

## 2015-02-23 NOTE — Progress Notes (Signed)
[facility]  ROUNDING NOTE   Subjective:   Hemodialysis yesterday. Tolerated treatment well. UF of 1 litre Still confused this morning. Closer to her baseline dementia though    Objective:  Vital signs in last 24 hours:  Temp:  [97.9 F (36.6 C)-99.3 F (37.4 C)] 98.7 F (37.1 C) (12/06 0508) Pulse Rate:  [73-95] 88 (12/06 0843) Resp:  [14-21] 18 (12/06 0508) BP: (104-139)/(56-87) 110/72 mmHg (12/06 0843) SpO2:  [97 %-100 %] 97 % (12/06 0508) Weight:  [63 kg (138 lb 14.2 oz)-63.2 kg (139 lb 5.3 oz)] 63.2 kg (139 lb 5.3 oz) (12/06 0616)  Weight change: 1.311 kg (2 lb 14.2 oz) Filed Weights   02/22/15 1131 02/22/15 1145 02/23/15 0616  Weight: 63 kg (138 lb 14.2 oz) 63 kg (138 lb 14.2 oz) 63.2 kg (139 lb 5.3 oz)    Intake/Output: I/O last 3 completed shifts: In: 480 [P.O.:480] Out: 0    Intake/Output this shift:     Physical Exam: General: no acute distress   Head: Normocephalic hearing intact  Eyes:/ENT Anicteric, moist membranes  Neck: Supple, trachea midline  Lungs:  Normal effort, clear ant/lat  Heart: S1S2 2/6 SEM  Abdomen:  Soft, nontender, BS present    Extremities:  trace peripheral edema.   Neuro: Awake, alert, conversant, will follow commands  Skin: No lesions  Access: LUE AVG    Basic Metabolic Panel:  Recent Labs Lab 02/17/15 1521 02/19/15 1330 02/22/15 1145  NA  --   --  133*  K  --   --  4.9  CL  --   --  92*  CO2  --   --  29  GLUCOSE  --   --  229*  BUN  --   --  73*  CREATININE  --   --  7.51*  CALCIUM  --   --  8.8*  PHOS 3.4 2.4* 1.7*    Liver Function Tests:  Recent Labs Lab 02/22/15 1145  ALBUMIN 2.9*   No results for input(s): LIPASE, AMYLASE in the last 168 hours. No results for input(s): AMMONIA in the last 168 hours.  CBC:  Recent Labs Lab 02/17/15 0458 02/19/15 1330 02/22/15 0631  WBC  --  4.5 3.9  HGB  --  11.1* 10.5*  HCT  --  34.5* 32.6*  MCV  --  87.7 87.1  PLT 168 187 172    Cardiac  Enzymes: No results for input(s): CKTOTAL, CKMB, CKMBINDEX, TROPONINI in the last 168 hours.  BNP: Invalid input(s): POCBNP  CBG:  Recent Labs Lab 02/22/15 1644 02/22/15 2146 02/22/15 2336 02/23/15 0357 02/23/15 0741  GLUCAP 137* 242* 177* 103* 143*    Microbiology: Results for orders placed or performed during the hospital encounter of 02/04/15  MRSA PCR Screening     Status: None   Collection Time: 02/04/15  2:05 PM  Result Value Ref Range Status   MRSA by PCR NEGATIVE NEGATIVE Final    Comment:        The GeneXpert MRSA Assay (FDA approved for NASAL specimens only), is one component of a comprehensive MRSA colonization surveillance program. It is not intended to diagnose MRSA infection nor to guide or monitor treatment for MRSA infections.     Coagulation Studies: No results for input(s): LABPROT, INR in the last 72 hours.  Urinalysis: No results for input(s): COLORURINE, LABSPEC, PHURINE, GLUCOSEU, HGBUR, BILIRUBINUR, KETONESUR, PROTEINUR, UROBILINOGEN, NITRITE, LEUKOCYTESUR in the last 72 hours.  Invalid input(s): APPERANCEUR  Imaging: No results found.    Medications:     . aspirin  81 mg Per Tube Daily  . calcium acetate (Phos Binder)  1,334 mg Oral TID WC  . clopidogrel  75 mg Per Tube Daily  . heparin subcutaneous  5,000 Units Subcutaneous Q12H  . hydrALAZINE  50 mg Oral 3 times per day  . Influenza vac split quadrivalent PF  0.5 mL Intramuscular Tomorrow-1000  . insulin aspart  0-15 Units Subcutaneous 6 times per day  . isosorbide dinitrate  10 mg Per Tube BID  . levETIRAcetam  500 mg Oral Daily   And  . levETIRAcetam  500 mg Oral Q M,W,F-1800  . metoprolol tartrate  12.5 mg Oral BID  . [START ON 02/24/2015] pneumococcal 23 valent vaccine  0.5 mL Intramuscular Tomorrow-1000   acetaminophen **OR** acetaminophen, alteplase, bisacodyl, heparin, lidocaine (PF), lidocaine-prilocaine, ondansetron, prochlorperazine  Assessment/ Plan:  69 y.o.  female past medical history of end-stage renal disease, myocardial infarction, CVA, hyperlipidemia, hypertension, diabetes mellitus, depression, secondary hyperparathyroidism, dementia, GERD, anemia chronic kidney disease who presented for a fistulogram and ended up having acute respiratory failure and acute tongue swelling/angioedema.  [facility] Nephrology MWF [facility] [address]   1.  End-stage renal disease on hemodialysis. Tolerating hemodialysis treatment well. - Continue MWF schedule.  - AVG   2.  Acute respiratory failure/angioedema.  Improved. Secondary to oxygen tubing - continue supportive care. Treated with methylprednisolone.   3.  Anemia chronic kidney disease. Hemoglobin  10.5. Continue to hold Epogen.   4.  Secondary hyperparathyroidism. Phos 1.7 - hold calcium acetate today  5. Hypertension: blood pressure at goal.  - metoprolol, isosorbide dinitrate, hydralazine  6. Diabetes mellitus type II with chronic kidney disease Continue glucose control.    LOS: 19 Bailey Kolbe 12/6/201611:17 AM

## 2015-02-23 NOTE — Discharge Planning (Addendum)
Pt IV removed.  DC papers reconciled and report called to [facility] [place]odd, [place].  Daughter informed of DC to [facility] and EMS called to transport.  Still awaiting arrival. VSS and RN assessment revealed stability for DC to facility. Med rec changed to admin all meds PO (not via feeding tube). New med rec sent electronically to facility and accepting LPN notified med rec had been corrected. Pt given dinner prior to discharge and also 3U insulin per sliding scale.

## 2015-02-23 NOTE — Progress Notes (Signed)
CSW spoke to [name] Lennon Alstromllie Love and she confirmed that she knows her mother is being transferred to [facility] and will meet her over there later this evening. EMS contacted to resume transfer. RN updated.   Wilford Gristara Jahnavi Muratore, [name] [phone]

## 2015-02-23 NOTE — Discharge Summary (Addendum)
[facility] [place] [facility]   PATIENT NAME: [name]    MR#:  [id number]  DATE OF BIRTH:  [birth year]  DATE OF ADMISSION:  02/04/2015 ADMITTING PHYSICIAN: Annice Needy, MD  DATE OF DISCHARGE: No discharge date for patient encounter.  PRIMARY CARE PHYSICIAN: Amy L Krebs, NP    ADMISSION DIAGNOSIS:  LT arm shuntogram    End Stage Renal  Pt is resting comfortably. According to the dialysis staff patient  tolerated hemodialysis by sitting in a chair  through the weekend. No overnight events  DISCHARGE DIAGNOSIS:  Principal Problem:   Altered mental status Active Problems:   Altered mental state   Angioedema   Acute respiratory failure (HCC)   Severe aortic valve stenosis   Coronary artery disease involving native coronary artery of native heart with angina pectoris with documented spasm (HCC)   NSTEMI (non-ST elevated myocardial infarction) (HCC)   Cardiomyopathy, hypertensive, benign   Dyspnea   Respiratory failure (HCC)   Metabolic encephalopathy   SECONDARY DIAGNOSIS:   Past Medical History  Diagnosis Date  . Stroke (HCC)   . MI (myocardial infarction) (HCC)   . Hearing impaired   . Hyperlipidemia   . Hypertension   . Diabetes mellitus without complication (HCC)   . Depression   . Coronary artery disease   . Dementia   . Secondary hyperparathyroidism (HCC)   . Dialysis patient [facility])     Monday, Wednesday and Friday.   . Chronic systolic CHF (congestive heart failure) (HCC)   . GERD (gastroesophageal reflux disease)   . Personal history of transient ischemic attack (TIA) and cerebral infarction without residual deficit   . Anemia   . Moderate aortic stenosis     a. echo 04/2014: 40-50%, moderate AS  . 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 Is mobile, alert and mostly oriented. Does have Chronic right sided weakness and some aphasia  * NSTEMI: troponins 3.26, Cardio already on board. Recent echo showing EF 35-40%,  Has severe AS which may need surgery if she comes out of current illness and family/patient wishes to go that route - certainly will need cath beforehand.  * Angioedema with respiratory compromise: thought to be from airway tubing  Improved, extubated 11/22 -Appreciate pulmonology's recommendations  stable since extubation  * ESRD on HD - Nephrology following. Continue hemodialysis per them - getting it M-W-F -Had HD yesterday -Patient tolerated chair-dialysis  through the weekend as reported by the dialysis staff  * Hypertension - Blood pressure well controlled at this time. Continue metoprolol, isosorbide, hydralazine. Adjust as needed.  * Coronary artery disease - Continue aspirin Plavix and statin  * Nutrition: Dysphagia 1 with Honey thick- tolerating per RN   Continue physical therapy, is recommending skilled nursing facility-  Disposition-skilled nursing facility, continue hemodialysis on Monday, Wednesday and Friday DISCHARGE CONDITIONS:   FAIR  CONSULTS OBTAINED:  Treatment Team:  Mady Haagensen, MD Ramonita Lab, MD   PROCEDURES  MV  DRUG ALLERGIES:   Allergies  Allergen Reactions  . Contrast Media [Iodinated Diagnostic Agents] Anaphylaxis  . Oxygen Swelling  and Rash    Capnography line oxygen tubing monitor    DISCHARGE MEDICATIONS:   Current Discharge Medication List    START taking these medications   Details  bisacodyl (DULCOLAX) 10 MG suppository Place 1 suppository (10 mg total) rectally daily as needed for moderate constipation. Qty: 12 suppository, Refills: 0    Influenza vac split  quadrivalent PF (FLUARIX) 0.5 ML injection Inject 0.5 mLs into the muscle tomorrow at 10 am. Qty: 0.5 mL, Refills: 0    isosorbide dinitrate (ISORDIL) 10 MG tablet Take 1 tablet (10 mg total) by mouth 2 (two) times daily. Qty: 30 tablet, Refills: 0    !! levETIRAcetam (KEPPRA) 100 MG/ML solution Take 5 mLs (500 mg total) by mouth daily. Qty: 473 mL, Refills: 12    !! levETIRAcetam (KEPPRA) 100 MG/ML solution Take 5 mLs (500 mg total) by mouth every Monday, Wednesday, and Friday at 6 PM. Qty: 473 mL, Refills: 12    lidocaine-prilocaine (EMLA) cream Apply 1 application topically as needed (topical anesthesia for hemodialysis if Gebauers and Lidocaine injection are ineffective.). Qty: 30 g, Refills: 0    pneumococcal 23 valent vaccine (PNU-IMMUNE) 25 MCG/0.5ML injection Inject 0.5 mLs into the muscle tomorrow at 10 am. Qty: 2.5 mL, Refills: 0     !! - Potential duplicate medications found. Please discuss with provider.    CONTINUE these medications which have CHANGED   Details  hydrALAZINE (APRESOLINE) 50 MG tablet Take 1 tablet (50 mg total) by mouth every 8 (eight) hours. Qty: 90 tablet, Refills: 0    metoprolol tartrate (LOPRESSOR) 25 MG tablet Take 0.5 tablets (12.5 mg total) by mouth 2 (two) times daily. Qty: 30 tablet, Refills: 0      CONTINUE these medications which have NOT CHANGED   Details  aspirin EC 81 MG tablet Take 81 mg by mouth daily.    calcium acetate (PHOSLO) 667 MG capsule Take 1,334 mg by mouth 3 (three) times daily with meals.     clopidogrel (PLAVIX) 75 MG tablet Take 1 tablet (75 mg total) by mouth daily. Qty: 30 tablet, Refills: 3    COD LIVER OIL PO Take 530 mg by mouth daily.    docusate sodium (COLACE) 100 MG capsule Take 100 mg by mouth at bedtime.     lactobacillus acidophilus (BACID) TABS tablet Take 1 tablet by mouth at bedtime.    !! lisinopril (PRINIVIL,ZESTRIL) 5 MG tablet Take 5 mg by mouth daily.    Omega-3 Fatty Acids (FISH OIL) 500 MG  CAPS Take 500 mg by mouth 2 (two) times daily.    Vitamin D, Ergocalciferol, (DRISDOL) 50000 UNITS CAPS capsule Take 50,000 Units by mouth every 30 (thirty) days. Pt takes on the 7th of every month.    atorvastatin (LIPITOR) 80 MG tablet Take 1 tablet (80 mg total) by mouth daily. Qty: 90 tablet, Refills: 3    insulin aspart (NOVOLOG) 100 UNIT/ML injection Inject 3-13 Units into the skin 3 (three) times daily with meals. Sliding scale    !! lisinopril (PRINIVIL,ZESTRIL) 20 MG tablet Take 1 tablet (20 mg total) by mouth daily. Qty: 90 tablet, Refills: 3    LORazepam (ATIVAN) 2 MG/ML concentrated solution Take 2 mg by mouth as needed for anxiety.    Nutritional Supplements (FEEDING SUPPLEMENT, NEPRO CARB STEADY,) LIQD Take 237 mLs by mouth 2 (two) times daily between meals. Qty: 60 Can, Refills: 0    promethazine (PHENERGAN) 25 MG tablet Take 25 mg by mouth every 6 (six) hours  as needed for nausea or vomiting.    ranitidine (ZANTAC) 150 MG tablet Take 1 tablet (150 mg total) by mouth at bedtime. Qty: 90 tablet, Refills: 3    vitamin C (ASCORBIC ACID) 500 MG tablet Take 500 mg by mouth 2 (two) times daily.      !! - Potential duplicate medications found. Please discuss with provider.    STOP taking these medications     isosorbide mononitrate (IMDUR) 30 MG 24 hr tablet      ranitidine (ZANTAC) 150 MG capsule          DISCHARGE INSTRUCTIONS:   Activity as tolerated, as per PT recommendations Diet renal, dysphagia type I with honey thick liquids by mouth  (patient does not have a feeding tube) Follow-up with primary care physician at the facility in 3 days Continue hemodialysis on Monday, Wednesday and Friday Follow-up with nephrology in a week Follow-up with cardiology in 1-2 weeks  DIET:  Renal diet  DISCHARGE CONDITION:  Fair  ACTIVITY:  Activity as tolerated  OXYGEN:  Home Oxygen: No.   Oxygen Delivery: room air  DISCHARGE LOCATION:  nursing home   If you  experience worsening of your admission symptoms, develop shortness of breath, life threatening emergency, suicidal or homicidal thoughts you must seek medical attention immediately by calling 911 or calling your MD immediately  if symptoms less severe.  You Must read complete instructions/literature along with all the possible adverse reactions/side effects for all the Medicines you take and that have been prescribed to you. Take any new Medicines after you have completely understood and accpet all the possible adverse reactions/side effects.   Please note  You were cared for by a hospitalist during your hospital stay. If you have any questions about your discharge medications or the care you received while you were in the hospital after you are discharged, you can call the unit and asked to speak with the hospitalist on call if the hospitalist that took care of you is not available. Once you are discharged, your primary care physician will handle any further medical issues. Please note that NO REFILLS for any discharge medications will be authorized once you are discharged, as it is imperative that you return to your primary care physician (or establish a relationship with a primary care physician if you do not have one) for your aftercare needs so that they can reassess your need for medications and monitor your lab values.     Today  No chief complaint on file.  Patient is resting comfortably. Poor historian. No overnight events. States feeling good  ROS:  Limited as patient is a poor historian  VITAL SIGNS:  Blood pressure 112/62, pulse 88, temperature 98.7 F (37.1 C), temperature source Oral, resp. rate 18, height  (1.676 m), weight 63.2 kg (139 lb 5.3 oz), SpO2 99 %.  I/O:    Intake/Output Summary (Last 24 hours) at 02/23/15 1633 Last data filed at 02/23/15 0830  Gross per 24 hour  Intake    240 ml  Output      0 ml  Net    240 ml    PHYSICAL EXAMINATION:  GENERAL:  69  y.o.-year-old patient lying in the bed with no acute distress.  EYES: Pupils equal, round, reactive to light and accommodation. No scleral icterus. HEENT: Head atraumatic, normocephalic. Oropharynx and nasopharynx clear.  NECK:  Supple, no jugular venous distention. No thyroid enlargement, no tenderness.  LUNGS: Normal breath sounds bilaterally, no wheezing, rales,rhonchi or  crepitation. No use of accessory muscles of respiration.  CARDIOVASCULAR: S1, S2 normal. No murmurs, rubs, or gallops.  ABDOMEN: Soft, non-tender, non-distended. Bowel sounds present. No organomegaly or mass.  EXTREMITIES: No pedal edema, cyanosis, or clubbing.  NEUROLOGIC: Awake and alert  PSYCHIATRIC: The patient is alert and oriented x 1 SKIN: No obvious rash, lesion, or ulcer.   DATA REVIEW:   CBC  Recent Labs Lab 02/22/15 0631  WBC 3.9  HGB 10.5*  HCT 32.6*  PLT 172    Chemistries   Recent Labs Lab 02/22/15 1145  NA 133*  K 4.9  CL 92*  CO2 29  GLUCOSE 229*  BUN 73*  CREATININE 7.51*  CALCIUM 8.8*    Cardiac Enzymes No results for input(s): TROPONINI in the last 168 hours.  Microbiology Results  Results for orders placed or performed during the hospital encounter of 02/04/15  MRSA PCR Screening     Status: None   Collection Time: 02/04/15  2:05 PM  Result Value Ref Range Status   MRSA by PCR NEGATIVE NEGATIVE Final    Comment:        The GeneXpert MRSA Assay (FDA approved for NASAL specimens only), is one component of a comprehensive MRSA colonization surveillance program. It is not intended to diagnose MRSA infection nor to guide or monitor treatment for MRSA infections.     RADIOLOGY:  No results found.  EKG:   Orders placed or performed during the hospital encounter of 02/04/15  . EKG 12-Lead  . EKG 12-Lead  . EKG 12-Lead  . EKG 12-Lead      Management plans discussed with the patient, family and they are in agreement.  CODE STATUS:     Code Status Orders         Start     Ordered   02/04/15 1710  Full code   Continuous     02/04/15 1709      TOTAL TIME TAKING CARE OF THIS PATIENT: 45 minutes.    @[facility] @  on 02/23/2015 at 4:33 PM  Between 7am to 6pm - Pager - [phone]  After 6pm go to [email or web address] - password [email or web address] [facility]  [facility]  Office  [phone]  CC: Primary care physician; Amy Diana Eves, NP

## 2015-02-23 NOTE — Clinical Social Work Placement (Signed)
   CLINICAL SOCIAL WORK PLACEMENT  NOTE  Date:  02/23/2015  Patient Details  Name: [name] MRN: [id number] Date of Birth: 03/09/1946  Clinical Social Work is seeking post-discharge placement for this patient at the Skilled  Nursing Facility level of care (*CSW will initial, date and re-position this form in  chart as items are completed):  No (spoke with dau on phone)   Patient/family provided with [facility] Clinical Social Work Department's list of facilities offering this level of care within the geographic area requested by the patient (or if unable, by the patient's family).  Yes   Patient/family informed of their freedom to choose among providers that offer the needed level of care, that participate in Medicare, Medicaid or managed care program needed by the patient, have an available bed and are willing to accept the patient.  Yes   Patient/family informed of [facility]'s ownership interest in [facility] and [facility], as well as of the fact that they are under no obligation to receive care at these facilities.  PASRR submitted to EDS on       PASRR number received on       Existing PASRR number confirmed on 02/23/15     FL2 transmitted to all facilities in geographic area requested by pt/family on 02/14/15     FL2 transmitted to all facilities within larger geographic area on       Patient informed that his/her managed care company has contracts with or will negotiate with certain facilities, including the following:        Yes   Patient/family informed of bed offers received.  Patient chooses bed [facility]     Physician recommends and patient chooses bed at      Patient to be transferred to [facility] on 02/23/15.  Patient to be transferred to facility by EMS     Patient family notified on 02/23/15 of transfer.  Name of family member notified:  Daughter Ellie Love     PHYSICIAN       Additional Comment:     _______________________________________________ Ned Cardara N Jeselle Hiser, [name] 02/23/2015, 2:26 PM

## 2015-02-23 NOTE — NC FL2 (Signed)
[place] MEDICAID FL2 LEVEL OF CARE SCREENING TOOL     IDENTIFICATION  Patient Name: [name] Birthdate: 24-Jun-1945 Sex: female Admission Date (Current Location): 02/04/2015  [place] and [place] Number: [facility] and Address:  [facility], [address], [place], [place] [address]      Provider Number: [id number]  Attending Physician Name and Address:  Ramonita Lab, MD  Relative Name and Phone Number:       Current Level of Care: Hospital Recommended Level of Care: Skilled Nursing Facility Prior Approval Number:    Date Approved/Denied:   PASRR Number:    Discharge Plan: SNF    Current Diagnoses: Patient Active Problem List   Diagnosis Date Noted  . Metabolic encephalopathy   . Respiratory failure (HCC)   . Dyspnea   . Severe aortic valve stenosis   . 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 NSTEMI (non-ST elevated myocardial infarction) (HCC)   . Cardiomyopathy, hypertensive, benign   . Angioedema 02/05/2015  . Acute respiratory failure (HCC) 02/05/2015  . Altered mental status 02/04/2015  . Altered mental state 02/04/2015  . Seizures (HCC) 12/02/2014  . Hyperlipidemia 08/11/2014  . End stage renal disease on dialysis (HCC) 05/12/2014  . Chronic systolic CHF (congestive heart failure) (HCC) 05/12/2014  . Secondary hypertension, unspecified 05/12/2014  . Type 2 diabetes mellitus with other circulatory complications (HCC) 05/12/2014  . Non-STEMI (non-ST elevated myocardial infarction) (HCC) 05/12/2014  . Moderate aortic valve stenosis 05/12/2014   Past Medical History  Diagnosis Date  . Stroke (HCC)   . MI (myocardial infarction) (HCC)   . Hearing impaired   . ESRD (end stage renal disease) (HCC)   . Hyperlipidemia   . Hypertension   . Diabetes mellitus without complication (HCC)   . Depression    . Coronary artery disease   . Dementia   . Secondary hyperparathyroidism (HCC)   . Dialysis patient [facility])     Monday, Wednesday and Friday.   . Chronic systolic CHF (congestive heart failure) (HCC)   . GERD (gastroesophageal reflux disease)   . Personal history of transient ischemic attack (TIA) and cerebral infarction without residual deficit   . Anemia   . Moderate aortic stenosis     a. echo 04/2014: 40-50%, moderate AS        Orientation ACTIVITIES/SOCIAL BLADDER RESPIRATION    Self, Situation, Place  Active, Family supportive Incontinent Normal  BEHAVIORAL SYMPTOMS/MOOD NEUROLOGICAL BOWEL NUTRITION STATUS    Convulsions/Seizures, Frequency (controlled with medications) Continent  (carb controlled)  PHYSICIAN VISITS COMMUNICATION OF NEEDS Height & Weight Skin  30 days Verbally  (167.6 cm) 139 lbs. Normal          AMBULATORY STATUS RESPIRATION    Assist extensive Normal      Personal Care Assistance Level of Assistance  Total care, Dressing, Bathing, Feeding Bathing Assistance: Maximum assistance Feeding assistance: Maximum assistance Dressing Assistance: Maximum assistance Total Care Assistance: Maximum assistance    Functional Limitations Info  Sight, Hearing, Speech Sight Info: Adequate Hearing Info: Adequate Speech Info: Adequate       SPECIAL CARE FACTORS FREQUENCY  PT (By licensed PT), OT (By licensed OT)     PT Frequency: 5 x week OT Frequency: 3 x week           Additional Factors Info  Code Status, Allergies Code Status Info: Full Code Allergies Info: Contrast Media  Current Medications (02/23/2015):  This is the current hospital active medication list Current Facility-Administered Medications  Medication Dose Route Frequency Provider Last Rate Last Dose  . acetaminophen (TYLENOL) tablet 325-650 mg  325-650 mg Oral Q4H PRN Annice NeedyJason S Dew, MD       Or  . acetaminophen (TYLENOL) suppository  325-650 mg  325-650 mg Rectal Q4H PRN Annice NeedyJason S Dew, MD      . alteplase (CATHFLO ACTIVASE) injection 2 mg  2 mg Intracatheter Once PRN Munsoor Lateef, MD      . aspirin chewable tablet 81 mg  81 mg Per Tube Daily Altamese DillingVaibhavkumar Vachhani, MD   81 mg at 02/23/15 0843  . bisacodyl (DULCOLAX) suppository 10 mg  10 mg Rectal Daily PRN Suan HalterMargaret F Campbell, MD      . clopidogrel (PLAVIX) tablet 75 mg  75 mg Per Tube Daily Merwyn Katosavid B Simonds, MD   75 mg at 02/23/15 0844  . heparin injection 1,000 Units  1,000 Units Dialysis PRN Munsoor Lateef, MD      . heparin injection 5,000 Units  5,000 Units Subcutaneous Q12H Shane CrutchPradeep Ramachandran, MD   5,000 Units at 02/23/15 0844  . hydrALAZINE (APRESOLINE) tablet 50 mg  50 mg Oral 3 times per day Suan HalterMargaret F Campbell, MD   50 mg at 02/23/15 1342  . Influenza vac split quadrivalent PF (FLUARIX) injection 0.5 mL  0.5 mL Intramuscular Tomorrow-1000 Vipul Shah, MD   0.5 mL at 02/14/15 1117  . insulin aspart (novoLOG) injection 0-15 Units  0-15 Units Subcutaneous 6 times per day Wyatt Hasteavid K Hower, MD   5 Units at 02/23/15 1307  . isosorbide dinitrate (ISORDIL) tablet 10 mg  10 mg Per Tube BID Gale Journeyatherine P Walsh, MD   10 mg at 02/23/15 0844  . levETIRAcetam (KEPPRA) 100 MG/ML solution 500 mg  500 mg Oral Daily Vishal Mungal, MD   500 mg at 02/23/15 0844   And  . levETIRAcetam (KEPPRA) 100 MG/ML solution 500 mg  500 mg Oral Q M,W,F-1800 Vishal Mungal, MD   500 mg at 02/22/15 1710  . lidocaine (PF) (XYLOCAINE) 1 % injection 5 mL  5 mL Intradermal PRN Munsoor Lateef, MD      . lidocaine-prilocaine (EMLA) cream 1 application  1 application Topical PRN Munsoor Lateef, MD      . metoprolol tartrate (LOPRESSOR) tablet 12.5 mg  12.5 mg Oral BID Suan HalterMargaret F Campbell, MD   12.5 mg at 02/23/15 0843  . ondansetron (ZOFRAN) injection 4 mg  4 mg Intravenous Q6H PRN Annice NeedyJason S Dew, MD      . pneumococcal 23 valent vaccine (PNU-IMMUNE) injection 0.5 mL  0.5 mL Intramuscular Once Ramonita LabAruna Brittay Mogle, MD      .  prochlorperazine (COMPAZINE) suppository 25 mg  25 mg Rectal Q12H PRN Suan HalterMargaret F Campbell, MD         Discharge Medications: Please see discharge summary for a list of discharge medications.  Relevant Imaging Results:  Relevant Lab Results:  Recent Labs    Additional Information HD MWF  Ned Cardara N Mitchell, [name]

## 2015-02-23 NOTE — Discharge Instructions (Signed)
activity as tolerated, as per PT recommendations Diet renal Follow-up with primary care physician at the facility in 3 days Continue hemodialysis on Monday, Wednesday and Friday Follow-up with nephrology in a week Follow-up with cardiology in 1-2 weeks

## 2015-02-23 NOTE — Progress Notes (Signed)
CSW confirmed SNF bed at [facility] for Pt today. MD has spoken with daughter who is aware of transfer. CSW texted [name] to have her contact billing office at [facility] regarding outstanding balance. [name] contacted office per [facility]. CSW called [name] multiple times and texted her that everything was ready to go for her mom to dc to [facility]. [name] has not responded or answered phone. CSW texted [name] that EMS is on standby until she returns call. RN aware, has called report.   CSW will follow up with staff once daughter is contacted.   Charlene Gristara Kenric Ginger, [name] [phone]

## 2015-02-23 NOTE — Discharge Planning (Signed)
EMS contacted to p/u pt and transport to [facility]. (RM [id number]).

## 2015-02-28 ENCOUNTER — Emergency Department: Payer: Medicare Other

## 2015-02-28 ENCOUNTER — Encounter: Payer: Self-pay | Admitting: Emergency Medicine

## 2015-02-28 ENCOUNTER — Emergency Department
Admission: EM | Admit: 2015-02-28 | Discharge: 2015-02-28 | Disposition: A | Discharge status: Home / Self Care | Payer: Medicare Other | Attending: Emergency Medicine | Admitting: Emergency Medicine

## 2015-02-28 DIAGNOSIS — I120 Hypertensive chronic kidney disease with stage 5 chronic kidney disease or end stage renal disease: Secondary | ICD-10-CM | POA: Diagnosis not present

## 2015-02-28 DIAGNOSIS — Z79899 Other long term (current) drug therapy: Secondary | ICD-10-CM | POA: Diagnosis not present

## 2015-02-28 DIAGNOSIS — Z7902 Long term (current) use of antithrombotics/antiplatelets: Secondary | ICD-10-CM | POA: Insufficient documentation

## 2015-02-28 DIAGNOSIS — E1159 Type 2 diabetes mellitus with other circulatory complications: Secondary | ICD-10-CM | POA: Diagnosis not present

## 2015-02-28 DIAGNOSIS — N390 Urinary tract infection, site not specified: Secondary | ICD-10-CM | POA: Diagnosis not present

## 2015-02-28 DIAGNOSIS — N186 End stage renal disease: Secondary | ICD-10-CM | POA: Insufficient documentation

## 2015-02-28 DIAGNOSIS — Z794 Long term (current) use of insulin: Secondary | ICD-10-CM | POA: Insufficient documentation

## 2015-02-28 DIAGNOSIS — R531 Weakness: Secondary | ICD-10-CM | POA: Diagnosis present

## 2015-02-28 DIAGNOSIS — Z7982 Long term (current) use of aspirin: Secondary | ICD-10-CM | POA: Insufficient documentation

## 2015-02-28 DIAGNOSIS — Z992 Dependence on renal dialysis: Secondary | ICD-10-CM | POA: Insufficient documentation

## 2015-02-28 LAB — CBC WITH DIFFERENTIAL/PLATELET
BASOS ABS: 0.1 10*3/uL (ref 0–0.1)
Basophils Relative: 1 %
EOS PCT: 3 %
Eosinophils Absolute: 0.1 10*3/uL (ref 0–0.7)
HEMATOCRIT: 33.2 % — AB (ref 35.0–47.0)
Hemoglobin: 10.8 g/dL — ABNORMAL LOW (ref 12.0–16.0)
LYMPHS ABS: 0.7 10*3/uL — AB (ref 1.0–3.6)
LYMPHS PCT: 16 %
MCH: 28.7 pg (ref 26.0–34.0)
MCHC: 32.5 g/dL (ref 32.0–36.0)
MCV: 88.3 fL (ref 80.0–100.0)
MONO ABS: 0.6 10*3/uL (ref 0.2–0.9)
MONOS PCT: 13 %
NEUTROS ABS: 2.9 10*3/uL (ref 1.4–6.5)
Neutrophils Relative %: 67 %
PLATELETS: 188 10*3/uL (ref 150–440)
RBC: 3.76 MIL/uL — ABNORMAL LOW (ref 3.80–5.20)
RDW: 21.6 % — AB (ref 11.5–14.5)
WBC: 4.3 10*3/uL (ref 3.6–11.0)

## 2015-02-28 LAB — COMPREHENSIVE METABOLIC PANEL
ALT: 25 U/L (ref 14–54)
AST: 28 U/L (ref 15–41)
Albumin: 3.3 g/dL — ABNORMAL LOW (ref 3.5–5.0)
Alkaline Phosphatase: 81 U/L (ref 38–126)
Anion gap: 13 (ref 5–15)
BILIRUBIN TOTAL: 0.7 mg/dL (ref 0.3–1.2)
BUN: 38 mg/dL — AB (ref 6–20)
CHLORIDE: 92 mmol/L — AB (ref 101–111)
CO2: 30 mmol/L (ref 22–32)
Calcium: 8.9 mg/dL (ref 8.9–10.3)
Creatinine, Ser: 5.5 mg/dL — ABNORMAL HIGH (ref 0.44–1.00)
GFR, EST AFRICAN AMERICAN: 8 mL/min — AB (ref >60)
GFR, EST NON AFRICAN AMERICAN: 7 mL/min — AB (ref >60)
Glucose, Bld: 225 mg/dL — ABNORMAL HIGH (ref 65–99)
POTASSIUM: 4.3 mmol/L (ref 3.5–5.1)
Sodium: 135 mmol/L (ref 135–145)
TOTAL PROTEIN: 7 g/dL (ref 6.5–8.1)

## 2015-02-28 LAB — URINALYSIS COMPLETE WITH MICROSCOPIC (ARMC ONLY)
BILIRUBIN URINE: NEGATIVE
Bacteria, UA: NONE SEEN
Glucose, UA: NEGATIVE mg/dL
KETONES UR: NEGATIVE mg/dL
NITRITE: NEGATIVE
PH: 7 (ref 5.0–8.0)
Protein, ur: >500 mg/dL — AB
SPECIFIC GRAVITY, URINE: 1.015 (ref 1.005–1.030)

## 2015-02-28 LAB — TROPONIN I: Troponin I: 2.61 ng/mL — ABNORMAL HIGH (ref <0.031)

## 2015-02-28 LAB — LACTIC ACID, PLASMA: Lactic Acid, Venous: 1.5 mmol/L (ref 0.5–2.0)

## 2015-02-28 MED ORDER — DEXTROSE 5 % IV SOLN
1.0000 g | Freq: Once | INTRAVENOUS | Status: AC
  Administered 2015-02-28: 1 g via INTRAVENOUS
  Filled 2015-02-28: qty 10

## 2015-02-28 MED ORDER — CEFTRIAXONE SODIUM 1 G IV SOLR: 1.0000 g | Freq: Two times a day (BID) | INTRAVENOUS | Status: DC

## 2015-02-28 NOTE — ED Provider Notes (Signed)
[facility] Emergency Department Provider Note    ____________________________________________  Time seen: On EMS arrival  I have reviewed the triage vital signs and the nursing notes.   HISTORY  Chief Complaint Weakness   History limited by: Altered Mental Status   HPI Gabryela Casanova is a 69 y.o. female who presents to the emergency department today from [facility] because of concerns for weakness and feeling unwell. The patient is nonverbal here this morning so history is quite limited. Per chart review it appears patient had a recent hospitalization for encephalopathy and an STEMI. She was discharged 5 days ago. The patient is able to nod yes or no and denies any chest pain, abdominal pain, shortness of breath nausea or vomiting. She does state that she has had some upper and lower extremity pain. Does not sound like she has had any recent fevers.  Past Medical History  Diagnosis Date  . Stroke (HCC)   . MI (myocardial infarction) (HCC)   . Hearing impaired   . Hyperlipidemia   . Hypertension   . Diabetes mellitus without complication (HCC)   . Depression   . Coronary artery disease   . Dementia   . Secondary hyperparathyroidism (HCC)   . Dialysis patient [facility])     Monday, Wednesday and Friday.   . Chronic systolic CHF (congestive heart failure) (HCC)   . GERD (gastroesophageal reflux disease)   . Personal history of transient ischemic attack (TIA) and cerebral infarction without residual deficit   . Anemia   . Moderate aortic stenosis     a. echo 04/2014: 40-50%, moderate AS  . ESRD (end stage renal disease) [facility])     Patient Active Problem List   Diagnosis Date Noted  . Metabolic encephalopathy   . Respiratory failure (HCC)   . Dyspnea   . Severe aortic valve stenosis   . 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 NSTEMI (non-ST elevated myocardial  infarction) (HCC)   . Cardiomyopathy, hypertensive, benign   . Angioedema 02/05/2015  . Acute respiratory failure (HCC) 02/05/2015  . Altered mental status 02/04/2015  . Altered mental state 02/04/2015  . Seizures (HCC) 12/02/2014  . Hyperlipidemia 08/11/2014  . End stage renal disease on dialysis (HCC) 05/12/2014  . Chronic systolic CHF (congestive heart failure) (HCC) 05/12/2014  . Secondary hypertension, unspecified 05/12/2014  . Type 2 diabetes mellitus with other circulatory complications (HCC) 05/12/2014  . Non-STEMI (non-ST elevated myocardial infarction) (HCC) 05/12/2014  . Moderate aortic valve stenosis 05/12/2014    Past Surgical History  Procedure Laterality Date  . Cataract extraction, bilateral    . Wrist surgery    . Peripheral vascular catheterization Left 07/28/2014    Procedure: A/V Shuntogram/Fistulagram;  Surgeon: Renford Dills, MD;  Location: [facility] INVASIVE CV LAB;  Service: Cardiovascular;  Laterality: Left;  . Peripheral vascular catheterization Left 07/28/2014    Procedure: A/V Shunt Intervention;  Surgeon: Renford Dills, MD;  Location: [facility] INVASIVE CV LAB;  Service: Cardiovascular;  Laterality: Left;  . Peripheral vascular catheterization Left 02/04/2015    Procedure: A/V Shuntogram/Fistulagram;  Surgeon: Annice Needy, MD;  Location: [facility] INVASIVE CV LAB;  Service: Cardiovascular;  Laterality: Left;  . Peripheral vascular catheterization Left 02/04/2015    Procedure: A/V Shunt Intervention;  Surgeon: Annice Needy, MD;  Location: [facility] INVASIVE CV LAB;  Service: Cardiovascular;  Laterality: Left;    Current Outpatient Rx  Name  Route  Sig  Dispense  Refill  . aspirin EC 81 MG tablet   Oral   Take 81 mg by mouth daily.         [name] atorvastatin (LIPITOR) 80 MG tablet   Oral   Take 1 tablet (80 mg total) by mouth daily.   90 tablet   3   . bisacodyl (DULCOLAX) 10 MG suppository   Rectal   Place 1 suppository (10 mg total) rectally daily as needed for  moderate constipation.   12 suppository   0   . calcium acetate (PHOSLO) 667 MG capsule   Oral   Take 1,334 mg by mouth 3 (three) times daily with meals.          . clopidogrel (PLAVIX) 75 MG tablet   Oral   Take 1 tablet (75 mg total) by mouth daily.   30 tablet   3   . COD LIVER OIL PO   Oral   Take 530 mg by mouth daily.         [name] docusate sodium (COLACE) 100 MG capsule   Oral   Take 100 mg by mouth at bedtime.          . hydrALAZINE (APRESOLINE) 50 MG tablet   Oral   Take 1 tablet (50 mg total) by mouth every 8 (eight) hours.   90 tablet   0   . Influenza vac split quadrivalent PF (FLUARIX) 0.5 ML injection   Intramuscular   Inject 0.5 mLs into the muscle tomorrow at 10 am.   0.5 mL   0   . insulin aspart (NOVOLOG) 100 UNIT/ML injection   Subcutaneous   Inject 3-13 Units into the skin 3 (three) times daily with meals. Sliding scale         . isosorbide dinitrate (ISORDIL) 10 MG tablet   Oral   Take 1 tablet (10 mg total) by mouth 2 (two) times daily.   30 tablet   0   . lactobacillus acidophilus (BACID) TABS tablet   Oral   Take 1 tablet by mouth at bedtime.         . levETIRAcetam (KEPPRA) 100 MG/ML solution   Oral   Take 5 mLs (500 mg total) by mouth daily.   473 mL   12   . levETIRAcetam (KEPPRA) 100 MG/ML solution   Oral   Take 5 mLs (500 mg total) by mouth every Monday, Wednesday, and Friday at 6 PM.   473 mL   12   . lidocaine-prilocaine (EMLA) cream   Topical   Apply 1 application topically as needed (topical anesthesia for hemodialysis if Gebauers and Lidocaine injection are ineffective.).   30 g   0   . lisinopril (PRINIVIL,ZESTRIL) 20 MG tablet   Oral   Take 1 tablet (20 mg total) by mouth daily.   90 tablet   3   . lisinopril (PRINIVIL,ZESTRIL) 5 MG tablet   Oral   Take 5 mg by mouth daily.         [name] LORazepam (ATIVAN) 2 MG/ML concentrated solution   Oral   Take 2 mg by mouth as needed for anxiety.         .  metoprolol tartrate (LOPRESSOR) 25 MG tablet   Oral   Take 0.5 tablets (12.5 mg total) by mouth 2 (two) times daily.   30 tablet   0   . Nutritional Supplements (FEEDING SUPPLEMENT, NEPRO CARB STEADY,) LIQD   Oral   Take  237 mLs by mouth 2 (two) times daily between meals.   60 Can   0   . Omega-3 Fatty Acids (FISH OIL) 500 MG CAPS   Oral   Take 500 mg by mouth 2 (two) times daily.         . pneumococcal 23 valent vaccine (PNU-IMMUNE) 25 MCG/0.5ML injection   Intramuscular   Inject 0.5 mLs into the muscle tomorrow at 10 am.   2.5 mL   0   . promethazine (PHENERGAN) 25 MG tablet   Oral   Take 25 mg by mouth every 6 (six) hours as needed for nausea or vomiting.         . ranitidine (ZANTAC) 150 MG tablet   Oral   Take 1 tablet (150 mg total) by mouth at bedtime.   90 tablet   3   . vitamin C (ASCORBIC ACID) 500 MG tablet   Oral   Take 500 mg by mouth 2 (two) times daily.          . Vitamin D, Ergocalciferol, (DRISDOL) 50000 UNITS CAPS capsule   Oral   Take 50,000 Units by mouth every 30 (thirty) days. Pt takes on the 7th of every month.           Allergies Contrast media and Oxygen  Family History  Problem Relation Age of Onset  . CAD Father     Social History Social History  Substance Use Topics  . Smoking status: Never Smoker   . Smokeless tobacco: Not on file  . Alcohol Use: No    Review of Systems  Constitutional: Negative for fever. Cardiovascular: Negative for chest pain. Respiratory: Negative for shortness of breath. Gastrointestinal: Negative for abdominal pain, vomiting and diarrhea. Neurological: Negative for headaches, focal weakness or numbness.  10-point ROS otherwise negative.  ____________________________________________   PHYSICAL EXAM:  VITAL SIGNS:   98.3 F (36.8 C)   106  18  127/89 mmHg  95 %     Constitutional: Awake and alert, nodding yes or no Eyes: Conjunctivae are normal. PERRL. Normal extraocular  movements. ENT   Head: Normocephalic and atraumatic.   Nose: No congestion/rhinnorhea.   Mouth/Throat: Mucous membranes are moist.   Neck: No stridor. Hematological/Lymphatic/Immunilogical: No cervical lymphadenopathy. Cardiovascular: Normal rate, regular rhythm.  No murmurs, rubs, or gallops. Respiratory: Normal respiratory effort without tachypnea nor retractions. Breath sounds are clear and equal bilaterally. No wheezes/rales/rhonchi. Gastrointestinal: Soft and nontender. No distention.  Genitourinary: Deferred Musculoskeletal: Normal range of motion in all extremities. No joint effusions.  No lower extremity tenderness nor edema. Neurologic:  Awake and alert. Nodding yes or no. Non verbal.  Skin:  Skin is warm, dry and intact. No rash noted. Psychiatric: Mood and affect are normal. Speech and behavior are normal. Patient exhibits appropriate insight and judgment.  ____________________________________________    LABS (pertinent positives/negatives)  Labs Reviewed  CBC WITH DIFFERENTIAL/PLATELET - Abnormal; Notable for the following:    RBC 3.76 (*)    Hemoglobin 10.8 (*)    HCT 33.2 (*)    RDW 21.6 (*)    Lymphs Abs 0.7 (*)    All other components within normal limits  COMPREHENSIVE METABOLIC PANEL - Abnormal; Notable for the following:    Chloride 92 (*)    Glucose, Bld 225 (*)    BUN 38 (*)    Creatinine, Ser 5.50 (*)    Albumin 3.3 (*)    GFR calc non Af Amer 7 (*)    GFR calc Af  Amer 8 (*)    All other components within normal limits  URINALYSIS COMPLETEWITH MICROSCOPIC ([facility] ONLY) - Abnormal; Notable for the following:    Color, Urine YELLOW (*)    APPearance TURBID (*)    Hgb urine dipstick 2+ (*)    Protein, ur >500 (*)    Leukocytes, UA 3+ (*)    Squamous Epithelial / LPF 0-5 (*)    All other components within normal limits  TROPONIN I - Abnormal; Notable for the following:    Troponin I 2.61 (*)    All other components within normal limits    LACTIC ACID, PLASMA     ____________________________________________   EKG  None  ____________________________________________    RADIOLOGY  CT head IMPRESSION: No acute intracranial abnormality. No definite acute cortical infarction. Stable atrophy and chronic white matter disease. Stable old infarcts in left parietal and left occipital lobe  CXR IMPRESSION: 1. Mild right basilar atelectasis. No acute cardiopulmonary disease otherwise. 2. Stable moderate to marked cardiomegaly without evidence of pulmonary edema.  ____________________________________________   PROCEDURES  Procedure(s) performed: None  Critical Care performed: No  ____________________________________________   INITIAL IMPRESSION / ASSESSMENT AND PLAN / ED COURSE  Pertinent labs & imaging results that were available during my care of the patient were reviewed by me and considered in my medical decision making (see chart for details).  Patient presented to the emergency department because of concerns for some altered mental status. On exam here patient is awake and alert however nonverbal. Workup is concerning for possible urinary tract infection. In addition her troponin remains elevated. I did have a conversation with the daughter Vena Austria love over the telephone. It appears at this point they're not interested in pursuing any invasive cardiology workup and are content with medical management. I did discuss that we could certainly treat the patient for urinary tract infection. [facility] is capable of giving IV medication per my case manager as will discharge home with IV antibiotics and instructions to follow-up with primary care.  ____________________________________________   FINAL CLINICAL IMPRESSION(S) / ED DIAGNOSES  Final diagnoses:  UTI (lower urinary tract infection)     Phineas Semen, MD 02/28/15 1521

## 2015-02-28 NOTE — Discharge Instructions (Signed)
Please have the patient follow up with her primary doctor. If she appears to be doing well on the IV antibiotics she might be able to be switched to oral antibiotics before the full 7 day course is completed. Please continue to watch her bedsore at this time. Please seek medical attention for any high fevers, chest pain, shortness of breath, change in behavior, persistent vomiting, bloody stool or any other new or concerning symptoms.   Urinary Tract Infection A urinary tract infection (UTI) can occur any place along the urinary tract. The tract includes the kidneys, ureters, bladder, and urethra. A type of germ called bacteria often causes a UTI. UTIs are often helped with antibiotic medicine.  HOME CARE   If given, take antibiotics as told by your doctor. Finish them even if you start to feel better.  Drink enough fluids to keep your pee (urine) clear or pale yellow.  Avoid tea, drinks with caffeine, and bubbly (carbonated) drinks.  Pee often. Avoid holding your pee in for a long time.  Pee before and after having sex (intercourse).  Wipe from front to back after you poop (bowel movement) if you are a woman. Use each tissue only once. GET HELP RIGHT AWAY IF:   You have back pain.  You have lower belly (abdominal) pain.  You have chills.  You feel sick to your stomach (nauseous).  You throw up (vomit).  Your burning or discomfort with peeing does not go away.  You have a fever.  Your symptoms are not better in 3 days. MAKE SURE YOU:   Understand these instructions.  Will watch your condition.  Will get help right away if you are not doing well or get worse.   This information is not intended to replace advice given to you by your health care provider. Make sure you discuss any questions you have with your health care provider.   Document Released: 08/23/2007 Document Revised: 03/27/2014 Document Reviewed: 10/05/2011 Elsevier Interactive Patient Education [employer]2016 [employer]  Inc[employer]

## 2015-02-28 NOTE — ED Notes (Signed)
Pt presents to ER from [facility]. Per EMS patient has "not been feeling well" . Pt is alert but unable to verbalize thoughts at this time.

## 2015-04-01 ENCOUNTER — Telehealth: Payer: Self-pay | Admitting: *Deleted

## 2015-04-01 NOTE — Telephone Encounter (Signed)
Patient's daughter requesting a call back. This is Urgent and time sensitive. Patient was recently hospitalized and she is being discharged from skilled nursing due to ins coverage. Patient's daughter does not feel she is ready to be discharged. She has 48 hrs to provide documentation stating why. Call back @ [phone] Vena Austria(Eleanor).

## 2015-04-02 NOTE — Telephone Encounter (Signed)
Called pt daughter LMTCB.  IF I am unable to come to the phone when she calls back (since it is time sensitive): I am not sure our office can provide documentation. I have not seen her in >6 mos and would have a hard time providing documentation as to why she is unable to be discharged from a SNF without having seen her face to face. At the point in which I last saw her, she was able to ambulate well with assistive devices. Her dementia was her major issue that made her require supervision. I am not sure there is much I can personally do to prevent her from being discharged from her facility.  Thanks! AK

## 2015-04-13 ENCOUNTER — Emergency Department: Payer: Medicare Other

## 2015-04-13 ENCOUNTER — Inpatient Hospital Stay
Admission: EM | Admit: 2015-04-13 | Discharge: 2015-04-21 | DRG: 100 | Disposition: A | Discharge status: Skilled Nursing Facility | Payer: Medicare Other | Attending: Internal Medicine | Admitting: Internal Medicine

## 2015-04-13 DIAGNOSIS — I159 Secondary hypertension, unspecified: Secondary | ICD-10-CM

## 2015-04-13 DIAGNOSIS — R778 Other specified abnormalities of plasma proteins: Secondary | ICD-10-CM | POA: Diagnosis present

## 2015-04-13 DIAGNOSIS — R569 Unspecified convulsions: Secondary | ICD-10-CM | POA: Diagnosis not present

## 2015-04-13 DIAGNOSIS — Z9841 Cataract extraction status, right eye: Secondary | ICD-10-CM | POA: Diagnosis not present

## 2015-04-13 DIAGNOSIS — E440 Moderate protein-calorie malnutrition: Secondary | ICD-10-CM | POA: Diagnosis present

## 2015-04-13 DIAGNOSIS — R4182 Altered mental status, unspecified: Secondary | ICD-10-CM

## 2015-04-13 DIAGNOSIS — Z992 Dependence on renal dialysis: Secondary | ICD-10-CM | POA: Diagnosis not present

## 2015-04-13 DIAGNOSIS — I252 Old myocardial infarction: Secondary | ICD-10-CM

## 2015-04-13 DIAGNOSIS — N2581 Secondary hyperparathyroidism of renal origin: Secondary | ICD-10-CM | POA: Diagnosis present

## 2015-04-13 DIAGNOSIS — R1310 Dysphagia, unspecified: Secondary | ICD-10-CM | POA: Diagnosis present

## 2015-04-13 DIAGNOSIS — Z9889 Other specified postprocedural states: Secondary | ICD-10-CM

## 2015-04-13 DIAGNOSIS — F0390 Unspecified dementia without behavioral disturbance: Secondary | ICD-10-CM | POA: Diagnosis present

## 2015-04-13 DIAGNOSIS — E46 Unspecified protein-calorie malnutrition: Secondary | ICD-10-CM | POA: Diagnosis not present

## 2015-04-13 DIAGNOSIS — I255 Ischemic cardiomyopathy: Secondary | ICD-10-CM | POA: Diagnosis present

## 2015-04-13 DIAGNOSIS — G9341 Metabolic encephalopathy: Secondary | ICD-10-CM

## 2015-04-13 DIAGNOSIS — Z9842 Cataract extraction status, left eye: Secondary | ICD-10-CM

## 2015-04-13 DIAGNOSIS — I350 Nonrheumatic aortic (valve) stenosis: Secondary | ICD-10-CM

## 2015-04-13 DIAGNOSIS — I5023 Acute on chronic systolic (congestive) heart failure: Secondary | ICD-10-CM | POA: Diagnosis present

## 2015-04-13 DIAGNOSIS — N186 End stage renal disease: Secondary | ICD-10-CM

## 2015-04-13 DIAGNOSIS — I120 Hypertensive chronic kidney disease with stage 5 chronic kidney disease or end stage renal disease: Secondary | ICD-10-CM | POA: Diagnosis present

## 2015-04-13 DIAGNOSIS — Z6823 Body mass index (BMI) 23.0-23.9, adult: Secondary | ICD-10-CM

## 2015-04-13 DIAGNOSIS — Z79899 Other long term (current) drug therapy: Secondary | ICD-10-CM

## 2015-04-13 DIAGNOSIS — D638 Anemia in other chronic diseases classified elsewhere: Secondary | ICD-10-CM | POA: Diagnosis present

## 2015-04-13 DIAGNOSIS — I214 Non-ST elevation (NSTEMI) myocardial infarction: Secondary | ICD-10-CM | POA: Diagnosis present

## 2015-04-13 DIAGNOSIS — Z515 Encounter for palliative care: Secondary | ICD-10-CM | POA: Diagnosis not present

## 2015-04-13 DIAGNOSIS — E1159 Type 2 diabetes mellitus with other circulatory complications: Secondary | ICD-10-CM

## 2015-04-13 DIAGNOSIS — Z7982 Long term (current) use of aspirin: Secondary | ICD-10-CM | POA: Diagnosis not present

## 2015-04-13 DIAGNOSIS — Z8674 Personal history of sudden cardiac arrest: Secondary | ICD-10-CM

## 2015-04-13 DIAGNOSIS — E1122 Type 2 diabetes mellitus with diabetic chronic kidney disease: Secondary | ICD-10-CM | POA: Diagnosis present

## 2015-04-13 DIAGNOSIS — H9190 Unspecified hearing loss, unspecified ear: Secondary | ICD-10-CM | POA: Diagnosis present

## 2015-04-13 DIAGNOSIS — Z8249 Family history of ischemic heart disease and other diseases of the circulatory system: Secondary | ICD-10-CM

## 2015-04-13 DIAGNOSIS — E785 Hyperlipidemia, unspecified: Secondary | ICD-10-CM | POA: Diagnosis present

## 2015-04-13 DIAGNOSIS — R7989 Other specified abnormal findings of blood chemistry: Secondary | ICD-10-CM

## 2015-04-13 DIAGNOSIS — G9340 Encephalopathy, unspecified: Secondary | ICD-10-CM | POA: Diagnosis not present

## 2015-04-13 DIAGNOSIS — Z794 Long term (current) use of insulin: Secondary | ICD-10-CM | POA: Diagnosis not present

## 2015-04-13 DIAGNOSIS — Z91041 Radiographic dye allergy status: Secondary | ICD-10-CM | POA: Diagnosis not present

## 2015-04-13 DIAGNOSIS — R4701 Aphasia: Secondary | ICD-10-CM | POA: Diagnosis present

## 2015-04-13 DIAGNOSIS — K219 Gastro-esophageal reflux disease without esophagitis: Secondary | ICD-10-CM | POA: Diagnosis present

## 2015-04-13 DIAGNOSIS — G40909 Epilepsy, unspecified, not intractable, without status epilepticus: Secondary | ICD-10-CM | POA: Diagnosis present

## 2015-04-13 DIAGNOSIS — I69351 Hemiplegia and hemiparesis following cerebral infarction affecting right dominant side: Secondary | ICD-10-CM | POA: Diagnosis not present

## 2015-04-13 DIAGNOSIS — I5022 Chronic systolic (congestive) heart failure: Secondary | ICD-10-CM

## 2015-04-13 DIAGNOSIS — I2510 Atherosclerotic heart disease of native coronary artery without angina pectoris: Secondary | ICD-10-CM | POA: Diagnosis present

## 2015-04-13 DIAGNOSIS — L8990 Pressure ulcer of unspecified site, unspecified stage: Secondary | ICD-10-CM | POA: Insufficient documentation

## 2015-04-13 LAB — COMPREHENSIVE METABOLIC PANEL
ALBUMIN: 3.3 g/dL — AB (ref 3.5–5.0)
ALBUMIN: 3.4 g/dL — AB (ref 3.5–5.0)
ALK PHOS: 93 U/L (ref 38–126)
ALK PHOS: 81 U/L (ref 38–126)
ALT: 19 U/L (ref 14–54)
ALT: 24 U/L (ref 14–54)
ANION GAP: 14 (ref 5–15)
AST: 28 U/L (ref 15–41)
AST: 31 U/L (ref 15–41)
Anion gap: 18 — ABNORMAL HIGH (ref 5–15)
BILIRUBIN TOTAL: 0.8 mg/dL (ref 0.3–1.2)
BUN: 30 mg/dL — AB (ref 6–20)
BUN: 38 mg/dL — ABNORMAL HIGH (ref 6–20)
CALCIUM: 8.3 mg/dL — AB (ref 8.9–10.3)
CALCIUM: 9 mg/dL (ref 8.9–10.3)
CO2: 25 mmol/L (ref 22–32)
CO2: 32 mmol/L (ref 22–32)
CREATININE: 3.43 mg/dL — AB (ref 0.44–1.00)
CREATININE: 4.22 mg/dL — AB (ref 0.44–1.00)
Chloride: 91 mmol/L — ABNORMAL LOW (ref 101–111)
Chloride: 92 mmol/L — ABNORMAL LOW (ref 101–111)
GFR calc Af Amer: 15 mL/min — ABNORMAL LOW (ref >60)
GFR calc Af Amer: 11 mL/min — ABNORMAL LOW (ref >60)
GFR calc non Af Amer: 10 mL/min — ABNORMAL LOW (ref >60)
GFR, EST NON AFRICAN AMERICAN: 13 mL/min — AB (ref >60)
GLUCOSE: 271 mg/dL — AB (ref 65–99)
GLUCOSE: 140 mg/dL — AB (ref 65–99)
Potassium: 3.9 mmol/L (ref 3.5–5.1)
Potassium: 3.5 mmol/L (ref 3.5–5.1)
SODIUM: 138 mmol/L (ref 135–145)
Sodium: 134 mmol/L — ABNORMAL LOW (ref 135–145)
TOTAL PROTEIN: 6.4 g/dL — AB (ref 6.5–8.1)
Total Bilirubin: 0.7 mg/dL (ref 0.3–1.2)
Total Protein: 6.4 g/dL — ABNORMAL LOW (ref 6.5–8.1)

## 2015-04-13 LAB — CBC WITH DIFFERENTIAL/PLATELET
BASOS ABS: 0 10*3/uL (ref 0–0.1)
BASOS PCT: 1 %
Eosinophils Absolute: 0 10*3/uL (ref 0–0.7)
Eosinophils Relative: 0 %
HEMATOCRIT: 32.6 % — AB (ref 35.0–47.0)
HEMOGLOBIN: 10.6 g/dL — AB (ref 12.0–16.0)
LYMPHS PCT: 10 %
Lymphs Abs: 0.6 10*3/uL — ABNORMAL LOW (ref 1.0–3.6)
MCH: 31 pg (ref 26.0–34.0)
MCHC: 32.6 g/dL (ref 32.0–36.0)
MCV: 95.1 fL (ref 80.0–100.0)
MONO ABS: 0.7 10*3/uL (ref 0.2–0.9)
MONOS PCT: 10 %
NEUTROS ABS: 5.1 10*3/uL (ref 1.4–6.5)
Neutrophils Relative %: 79 %
Platelets: 125 10*3/uL — ABNORMAL LOW (ref 150–440)
RBC: 3.43 MIL/uL — ABNORMAL LOW (ref 3.80–5.20)
RDW: 24.9 % — AB (ref 11.5–14.5)
WBC: 6.4 10*3/uL (ref 3.6–11.0)

## 2015-04-13 LAB — MRSA PCR SCREENING: MRSA by PCR: NEGATIVE

## 2015-04-13 LAB — BLOOD GAS, ARTERIAL
ALLENS TEST (PASS/FAIL): POSITIVE — AB
Acid-Base Excess: 10.1 mmol/L — ABNORMAL HIGH (ref 0.0–3.0)
Bicarbonate: 31.2 mEq/L — ABNORMAL HIGH (ref 21.0–28.0)
FIO2: 0.21
O2 SAT: 95.7 %
PCO2 ART: 29 mmHg — AB (ref 32.0–48.0)
PH ART: 7.64 — AB (ref 7.350–7.450)
PO2 ART: 63 mmHg — AB (ref 83.0–108.0)
Patient temperature: 37

## 2015-04-13 LAB — CBC
HEMATOCRIT: 34.5 % — AB (ref 35.0–47.0)
HEMOGLOBIN: 11.3 g/dL — AB (ref 12.0–16.0)
MCH: 31.3 pg (ref 26.0–34.0)
MCHC: 32.6 g/dL (ref 32.0–36.0)
MCV: 96 fL (ref 80.0–100.0)
Platelets: 123 10*3/uL — ABNORMAL LOW (ref 150–440)
RBC: 3.59 MIL/uL — AB (ref 3.80–5.20)
RDW: 25.1 % — ABNORMAL HIGH (ref 11.5–14.5)
WBC: 8.5 10*3/uL (ref 3.6–11.0)

## 2015-04-13 LAB — APTT: aPTT: 39 seconds — ABNORMAL HIGH (ref 24–36)

## 2015-04-13 LAB — GLUCOSE, CAPILLARY
Glucose-Capillary: 228 mg/dL — ABNORMAL HIGH (ref 65–99)
Glucose-Capillary: 200 mg/dL — ABNORMAL HIGH (ref 65–99)
Glucose-Capillary: 170 mg/dL — ABNORMAL HIGH (ref 65–99)
Glucose-Capillary: 167 mg/dL — ABNORMAL HIGH (ref 65–99)
Glucose-Capillary: 113 mg/dL — ABNORMAL HIGH (ref 65–99)

## 2015-04-13 LAB — BASIC METABOLIC PANEL
ANION GAP: 17 — AB (ref 5–15)
BUN: 34 mg/dL — ABNORMAL HIGH (ref 6–20)
CHLORIDE: 92 mmol/L — AB (ref 101–111)
CO2: 27 mmol/L (ref 22–32)
Calcium: 8.8 mg/dL — ABNORMAL LOW (ref 8.9–10.3)
Creatinine, Ser: 3.92 mg/dL — ABNORMAL HIGH (ref 0.44–1.00)
GFR calc non Af Amer: 11 mL/min — ABNORMAL LOW (ref >60)
GFR, EST AFRICAN AMERICAN: 12 mL/min — AB (ref >60)
Glucose, Bld: 244 mg/dL — ABNORMAL HIGH (ref 65–99)
Potassium: 3.8 mmol/L (ref 3.5–5.1)
Sodium: 136 mmol/L (ref 135–145)

## 2015-04-13 LAB — PROTIME-INR
INR: 1.23
Prothrombin Time: 15.7 seconds — ABNORMAL HIGH (ref 11.4–15.0)

## 2015-04-13 LAB — BRAIN NATRIURETIC PEPTIDE: B Natriuretic Peptide: >4500 pg/mL — ABNORMAL HIGH (ref 0.0–100.0)

## 2015-04-13 LAB — TROPONIN I
TROPONIN I: 0.12 ng/mL — AB (ref <0.031)
TROPONIN I: 1.05 ng/mL — AB (ref <0.031)
Troponin I: 3.44 ng/mL — ABNORMAL HIGH (ref <0.031)
Troponin I: 7.7 ng/mL — ABNORMAL HIGH (ref <0.031)

## 2015-04-13 LAB — LACTIC ACID, PLASMA
LACTIC ACID, VENOUS: 3.3 mmol/L — AB (ref 0.5–2.0)
Lactic Acid, Venous: 5.7 mmol/L (ref 0.5–2.0)

## 2015-04-13 MED ORDER — SODIUM CHLORIDE 0.9 % IV SOLN
500.0000 mg | Freq: Two times a day (BID) | INTRAVENOUS | Status: DC
  Filled 2015-04-13: qty 5

## 2015-04-13 MED ORDER — SODIUM BICARBONATE 8.4 % IV SOLN
50.0000 meq | Freq: Once | INTRAVENOUS | Status: AC
  Administered 2015-04-13: 50 meq via INTRAVENOUS
  Filled 2015-04-13: qty 50

## 2015-04-13 MED ORDER — CLOPIDOGREL BISULFATE 75 MG PO TABS
75.0000 mg | ORAL_TABLET | Freq: Every day | ORAL | Status: DC
  Administered 2015-04-13 – 2015-04-21 (×9): 75 mg via ORAL
  Filled 2015-04-13 (×10): qty 1

## 2015-04-13 MED ORDER — SODIUM CHLORIDE 0.9 % IV SOLN
500.0000 mg | INTRAVENOUS | Status: DC
  Administered 2015-04-14: 500 mg via INTRAVENOUS
  Filled 2015-04-13: qty 5

## 2015-04-13 MED ORDER — SODIUM CHLORIDE 0.9 % IV SOLN
500.0000 mg | INTRAVENOUS | Status: DC
  Filled 2015-04-13: qty 5

## 2015-04-13 MED ORDER — ATORVASTATIN CALCIUM 20 MG PO TABS
80.0000 mg | ORAL_TABLET | Freq: Every day | ORAL | Status: DC
  Administered 2015-04-13 – 2015-04-21 (×9): 80 mg via ORAL
  Filled 2015-04-13 (×10): qty 4

## 2015-04-13 MED ORDER — CALCIUM GLUCONATE 10 % IV SOLN
1.0000 g | Freq: Once | INTRAVENOUS | Status: AC
  Administered 2015-04-13: 1 g via INTRAVENOUS
  Filled 2015-04-13: qty 10

## 2015-04-13 MED ORDER — ASPIRIN EC 81 MG PO TBEC
81.0000 mg | DELAYED_RELEASE_TABLET | Freq: Every day | ORAL | Status: DC
Start: 2015-04-13 — End: 2015-04-21
  Administered 2015-04-13 – 2015-04-21 (×9): 81 mg via ORAL
  Filled 2015-04-13 (×10): qty 1

## 2015-04-13 MED ORDER — SODIUM CHLORIDE 0.9 % IV BOLUS (SEPSIS)
500.0000 mL | Freq: Once | INTRAVENOUS | Status: AC
  Administered 2015-04-13: 500 mL via INTRAVENOUS

## 2015-04-13 MED ORDER — ONDANSETRON HCL 4 MG PO TABS: 4.0000 mg | ORAL_TABLET | Freq: Four times a day (QID) | ORAL | Status: DC | PRN

## 2015-04-13 MED ORDER — SODIUM CHLORIDE 0.9 % IV SOLN
400.0000 mg | Freq: Once | INTRAVENOUS | Status: AC
  Administered 2015-04-13: 400 mg via INTRAVENOUS
  Filled 2015-04-13: qty 4

## 2015-04-13 MED ORDER — ASPIRIN 300 MG RE SUPP
300.0000 mg | Freq: Once | RECTAL | Status: AC
  Administered 2015-04-13: 300 mg via RECTAL
  Filled 2015-04-13: qty 1

## 2015-04-13 MED ORDER — HEPARIN BOLUS VIA INFUSION
3500.0000 [IU] | Freq: Once | INTRAVENOUS | Status: AC
  Administered 2015-04-13: 3500 [IU] via INTRAVENOUS
  Filled 2015-04-13: qty 3500

## 2015-04-13 MED ORDER — SODIUM CHLORIDE 0.9 % IJ SOLN
3.0000 mL | Freq: Two times a day (BID) | INTRAMUSCULAR | Status: DC
  Administered 2015-04-13 – 2015-04-21 (×17): 3 mL via INTRAVENOUS

## 2015-04-13 MED ORDER — HEPARIN (PORCINE) IN NACL 100-0.45 UNIT/ML-% IJ SOLN
12.0000 [IU]/kg/h | INTRAMUSCULAR | Status: DC
  Administered 2015-04-13: 12 [IU]/kg/h via INTRAVENOUS
  Filled 2015-04-13: qty 250

## 2015-04-13 MED ORDER — INSULIN ASPART 100 UNIT/ML ~~LOC~~ SOLN
0.0000 [IU] | Freq: Four times a day (QID) | SUBCUTANEOUS | Status: DC
  Administered 2015-04-13: 5 [IU] via SUBCUTANEOUS
  Administered 2015-04-13 – 2015-04-15 (×3): 2 [IU] via SUBCUTANEOUS
  Administered 2015-04-15: 7 [IU] via SUBCUTANEOUS
  Administered 2015-04-15 (×2): 3 [IU] via SUBCUTANEOUS
  Administered 2015-04-16: 2 [IU] via SUBCUTANEOUS
  Administered 2015-04-16: 1 [IU] via SUBCUTANEOUS
  Administered 2015-04-17 (×2): 2 [IU] via SUBCUTANEOUS
  Administered 2015-04-17: 7 [IU] via SUBCUTANEOUS
  Administered 2015-04-17: 3 [IU] via SUBCUTANEOUS
  Administered 2015-04-18 (×2): 2 [IU] via SUBCUTANEOUS
  Administered 2015-04-18 (×2): 3 [IU] via SUBCUTANEOUS
  Administered 2015-04-19: 2 [IU] via SUBCUTANEOUS
  Administered 2015-04-19: 5 [IU] via SUBCUTANEOUS
  Administered 2015-04-19 (×2): 1 [IU] via SUBCUTANEOUS
  Administered 2015-04-20: 5 [IU] via SUBCUTANEOUS
  Administered 2015-04-20: 7 [IU] via SUBCUTANEOUS
  Administered 2015-04-20: 5 [IU] via SUBCUTANEOUS
  Administered 2015-04-20: 3 [IU] via SUBCUTANEOUS
  Administered 2015-04-21: 5 [IU] via SUBCUTANEOUS
  Administered 2015-04-21: 1 [IU] via SUBCUTANEOUS
  Administered 2015-04-21: 2 [IU] via SUBCUTANEOUS
  Filled 2015-04-13: qty 1
  Filled 2015-04-13: qty 2
  Filled 2015-04-13: qty 3
  Filled 2015-04-13: qty 5
  Filled 2015-04-13 (×2): qty 3
  Filled 2015-04-13: qty 2
  Filled 2015-04-13: qty 7
  Filled 2015-04-13: qty 3
  Filled 2015-04-13: qty 2
  Filled 2015-04-13: qty 1
  Filled 2015-04-13: qty 5
  Filled 2015-04-13: qty 3
  Filled 2015-04-13 (×2): qty 2
  Filled 2015-04-13: qty 7
  Filled 2015-04-13: qty 1
  Filled 2015-04-13: qty 3
  Filled 2015-04-13 (×2): qty 5
  Filled 2015-04-13: qty 2
  Filled 2015-04-13: qty 4
  Filled 2015-04-13: qty 1
  Filled 2015-04-13 (×2): qty 2
  Filled 2015-04-13: qty 7
  Filled 2015-04-13: qty 5
  Filled 2015-04-13: qty 2

## 2015-04-13 MED ORDER — ONDANSETRON HCL 4 MG/2ML IJ SOLN: 4.0000 mg | Freq: Four times a day (QID) | INTRAMUSCULAR | Status: DC | PRN

## 2015-04-13 MED ORDER — SODIUM CHLORIDE 0.9 % IV SOLN
1000.0000 mg | Freq: Once | INTRAVENOUS | Status: AC
  Administered 2015-04-13: 1000 mg via INTRAVENOUS
  Filled 2015-04-13: qty 10

## 2015-04-13 MED ORDER — HEPARIN SODIUM (PORCINE) 5000 UNIT/ML IJ SOLN
5000.0000 [IU] | Freq: Three times a day (TID) | INTRAMUSCULAR | Status: DC
  Administered 2015-04-13 (×2): 5000 [IU] via SUBCUTANEOUS
  Filled 2015-04-13 (×2): qty 1

## 2015-04-13 NOTE — ED Notes (Signed)
Pt transported to CCU room13 via stretcher with RN.

## 2015-04-13 NOTE — ED Notes (Signed)
Pt arrived to ED via EMS from home with reports of decreased LOC. EMS reports pt was laying on cough and woke family up with "snoring loudly". Pt alert to verbal only upon arrival.

## 2015-04-13 NOTE — H&P (Signed)
[facility] - [place] at [facility]   PATIENT NAME: [name]    MR#:  [id number]  DATE OF BIRTH:  1945/10/15  DATE OF ADMISSION:  04/13/2015  PRIMARY CARE PHYSICIAN: Filbert Berthold, NP   REQUESTING/REFERRING PHYSICIAN: Dolores Frame, MD  CHIEF COMPLAINT:   Chief Complaint  Patient presents with  . Altered Mental Status    HISTORY OF PRESENT ILLNESS:  [name]  is a 70 y.o. female who presents with acute encephalopathy.  Patient is unable to contribute to her history due to her condition.  Her daughter is her caregiver and provides the history.  Patient is ESRD on dialysis, and had dialysis the day of admission the ED. She stayed for an extra amount of time due to, patients with her fistula, specifically excessive bleeding. Daughter states that when she got home she was tired, lethargic, less responsive, but that this is not abnormal for her after her dialysis session. However, she then started displaying some behavior which was not dissimilar to her prior seizures from several months ago. She began grunting and then shaking in her arms turning her head to the side. This continued for some time and the patient did not begin to become more responsive than she normally does after dialysis, so her daughter brought her to the ED for evaluation. Here she was found to have a BNP greater than 4500, troponin 0.12, lactic acid 5.7. She was displaying the same behavior. There was some concern for seizure. Chest x-ray is okay, and other labs largely stable. Hospitalists were called for admission for acute encephalopathy.  PAST MEDICAL HISTORY:   Past Medical History  Diagnosis Date  . Stroke (HCC)   . MI (myocardial infarction) (HCC)   . Hearing impaired   . Hyperlipidemia   . Hypertension   . Diabetes mellitus without complication (HCC)   . Depression   . Coronary artery disease   . Dementia   . Secondary hyperparathyroidism (HCC)   . Dialysis patient [facility])      Monday, Wednesday and Friday.   . Chronic systolic CHF (congestive heart failure) (HCC)   . GERD (gastroesophageal reflux disease)   . Personal history of transient ischemic attack (TIA) and cerebral infarction without residual deficit   . Anemia   . Moderate aortic stenosis     a. echo 04/2014: 40-50%, moderate AS  . ESRD (end stage renal disease) (HCC)     PAST SURGICAL HISTORY:   Past Surgical History  Procedure Laterality Date  . Cataract extraction, bilateral    . Wrist surgery    . Peripheral vascular catheterization Left 07/28/2014    Procedure: A/V Shuntogram/Fistulagram;  Surgeon: Renford Dills, MD;  Location: [facility] INVASIVE CV LAB;  Service: Cardiovascular;  Laterality: Left;  . Peripheral vascular catheterization Left 07/28/2014    Procedure: A/V Shunt Intervention;  Surgeon: Renford Dills, MD;  Location: [facility] INVASIVE CV LAB;  Service: Cardiovascular;  Laterality: Left;  . Peripheral vascular catheterization Left 02/04/2015    Procedure: A/V Shuntogram/Fistulagram;  Surgeon: Annice Needy, MD;  Location: [facility] INVASIVE CV LAB;  Service: Cardiovascular;  Laterality: Left;  . Peripheral vascular catheterization Left 02/04/2015    Procedure: A/V Shunt Intervention;  Surgeon: Annice Needy, MD;  Location: [facility] INVASIVE CV LAB;  Service: Cardiovascular;  Laterality: Left;    SOCIAL HISTORY:   Social History  Substance Use Topics  . Smoking status: Never Smoker   . Smokeless tobacco: Not on file  . Alcohol Use:  No    FAMILY HISTORY:   Family History  Problem Relation Age of Onset  . CAD Father     DRUG ALLERGIES:   Allergies  Allergen Reactions  . Contrast Media [Iodinated Diagnostic Agents] Anaphylaxis    MEDICATIONS AT HOME:   Prior to Admission medications   Medication Sig Start Date End Date Taking? Authorizing Provider  aspirin EC 81 MG tablet Take 81 mg by mouth daily.    Historical Provider, MD  atorvastatin (LIPITOR) 80 MG tablet Take 1 tablet (80 mg  total) by mouth daily. 08/11/14   Antonieta Iba, MD  bisacodyl (DULCOLAX) 10 MG suppository Place 1 suppository (10 mg total) rectally daily as needed for moderate constipation. 02/23/15   Ramonita Lab, MD  calcium acetate (PHOSLO) 667 MG capsule Take 1,334 mg by mouth 3 (three) times daily with meals.     Historical Provider, MD  cefTRIAXone (ROCEPHIN) 1 G SOLR injection Inject 1 g into the vein 2 (two) times daily. 02/28/15   Phineas Semen, MD  clopidogrel (PLAVIX) 75 MG tablet Take 1 tablet (75 mg total) by mouth daily. 09/07/14   Antonieta Iba, MD  COD LIVER OIL PO Take 530 mg by mouth daily.    Historical Provider, MD  docusate sodium (COLACE) 100 MG capsule Take 100 mg by mouth at bedtime.     Historical Provider, MD  hydrALAZINE (APRESOLINE) 50 MG tablet Take 1 tablet (50 mg total) by mouth every 8 (eight) hours. 02/23/15   Ramonita Lab, MD  Influenza vac split quadrivalent PF (FLUARIX) 0.5 ML injection Inject 0.5 mLs into the muscle tomorrow at 10 am. 02/23/15   Ramonita Lab, MD  insulin aspart (NOVOLOG) 100 UNIT/ML injection Inject 3-13 Units into the skin 3 (three) times daily with meals. Sliding scale    Historical Provider, MD  isosorbide dinitrate (ISORDIL) 10 MG tablet Take 1 tablet (10 mg total) by mouth 2 (two) times daily. 02/23/15   Ramonita Lab, MD  lactobacillus acidophilus (BACID) TABS tablet Take 1 tablet by mouth at bedtime.    Historical Provider, MD  levETIRAcetam (KEPPRA) 100 MG/ML solution Take 5 mLs (500 mg total) by mouth daily. 02/23/15   Ramonita Lab, MD  levETIRAcetam (KEPPRA) 100 MG/ML solution Take 5 mLs (500 mg total) by mouth every Monday, Wednesday, and Friday at 6 PM. 02/23/15   Ramonita Lab, MD  lidocaine-prilocaine (EMLA) cream Apply 1 application topically as needed (topical anesthesia for hemodialysis if Gebauers and Lidocaine injection are ineffective.). 02/23/15   Ramonita Lab, MD  lisinopril (PRINIVIL,ZESTRIL) 20 MG tablet Take 1 tablet (20 mg total) by mouth  daily. 07/16/14   Antonieta Iba, MD  lisinopril (PRINIVIL,ZESTRIL) 5 MG tablet Take 5 mg by mouth daily.    Historical Provider, MD  LORazepam (ATIVAN) 2 MG/ML concentrated solution Take 2 mg by mouth as needed for anxiety.    Historical Provider, MD  metoprolol tartrate (LOPRESSOR) 25 MG tablet Take 0.5 tablets (12.5 mg total) by mouth 2 (two) times daily. 02/23/15   Ramonita Lab, MD  Nutritional Supplements (FEEDING SUPPLEMENT, NEPRO CARB STEADY,) LIQD Take 237 mLs by mouth 2 (two) times daily between meals. 12/05/14   Gale Journey, MD  Omega-3 Fatty Acids (FISH OIL) 500 MG CAPS Take 500 mg by mouth 2 (two) times daily.    Historical Provider, MD  pneumococcal 23 valent vaccine (PNU-IMMUNE) 25 MCG/0.5ML injection Inject 0.5 mLs into the muscle tomorrow at 10 am. 02/24/15   Ramonita Lab, MD  promethazine (  PHENERGAN) 25 MG tablet Take 25 mg by mouth every 6 (six) hours as needed for nausea or vomiting.    Historical Provider, MD  ranitidine (ZANTAC) 150 MG tablet Take 1 tablet (150 mg total) by mouth at bedtime. 02/16/15   Amy Rusty Aus, NP  vitamin C (ASCORBIC ACID) 500 MG tablet Take 500 mg by mouth 2 (two) times daily.     Historical Provider, MD  Vitamin D, Ergocalciferol, (DRISDOL) 50000 UNITS CAPS capsule Take 50,000 Units by mouth every 30 (thirty) days. Pt takes on the 7th of every month.    Historical Provider, MD    REVIEW OF SYSTEMS:  Review of Systems  Unable to perform ROS: critical illness     VITAL SIGNS:   Filed Vitals:   04/13/15 0222 04/13/15 0245 04/13/15 0300 04/13/15 0330  BP: 106/67  Pulse: 83 80  85  Temp:      TempSrc:      Resp: Height:      Weight:      SpO2: 100% 100% 95% 100%   Wt Readings from Last 3 Encounters:  04/13/15 58.968 kg (130 lb)  02/28/15 61.236 kg (135 lb)  02/23/15 63.2 kg (139 lb 5.3 oz)    PHYSICAL EXAMINATION:  Physical Exam  Vitals reviewed. Constitutional: She appears well-developed and  well-nourished.  HENT:  Head: Normocephalic and atraumatic.  Mouth/Throat: Oropharynx is clear and moist.  Eyes: Conjunctivae and EOM are normal. Pupils are equal, round, and reactive to light. No scleral icterus.  Neck: Normal range of motion. Neck supple. No JVD present. No thyromegaly present.  Cardiovascular: Normal rate, regular rhythm and intact distal pulses.  Exam reveals no gallop and no friction rub.   No murmur heard. Respiratory: Effort normal and breath sounds normal. No respiratory distress. She has no wheezes. She has no rales.  GI: Soft. Bowel sounds are normal. She exhibits no distension. There is no tenderness.  Musculoskeletal: Normal range of motion. She exhibits no edema.  No arthritis, no gout  Lymphadenopathy:    She has no cervical adenopathy.  Neurological:  Unable to assess due to critical illness  Skin: Skin is warm and dry. No rash noted. No erythema.  Psychiatric:  Unable to assess due to critical illness    LABORATORY PANEL:   CBC  Recent Labs Lab 04/13/15 0128  WBC 6.4  HGB 10.6*  HCT 32.6*  PLT 125*   ------------------------------------------------------------------------------------------------------------------  Chemistries   Recent Labs Lab 04/13/15 0128  NA 134*  K 3.9  CL 91*  CO2 25  GLUCOSE 271*  BUN 30*  CREATININE 3.43*  CALCIUM 8.3*  AST 28  ALT 19  ALKPHOS 93  BILITOT 0.8   ------------------------------------------------------------------------------------------------------------------  Cardiac Enzymes  Recent Labs Lab 04/13/15 0128  TROPONINI 0.12*   ------------------------------------------------------------------------------------------------------------------  RADIOLOGY:  Ct Head Wo Contrast  04/13/2015  CLINICAL DATA:  Decreased level of consciousness. Unresponsive. Patient was laying on couch and woke family with loud snoring. EXAM: CT HEAD WITHOUT CONTRAST TECHNIQUE: Contiguous axial images were  obtained from the base of the skull through the vertex without intravenous contrast. COMPARISON:  02/28/2015 FINDINGS: Diffuse cerebral atrophy. Ventricular dilatation consistent with central atrophy. Low-attenuation changes throughout the deep white matter consistent with small vessel ischemia. Old lacunar infarcts in the basal ganglia and thalamus bilaterally. Focal area of old encephalomalacia in the left posterior parietal and occipital lobes consistent with old infarcts. No significant change since prior study. No abnormal  extra-axial fluid collections. Gray-white matter junctions are distinct. Basal cisterns are not effaced. No acute intracranial hemorrhage. Calvarium appears intact. Visualized paranasal sinuses and mastoid air cells are not opacified. Vascular calcifications. IMPRESSION: No acute intracranial abnormalities. Chronic atrophy and small vessel ischemic changes. 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 Electronically Signed   By: Burman Nieves M.D.   On: 04/13/2015 01:44   Dg Chest Port 1 View  04/13/2015  CLINICAL DATA:  Patient is unresponsive. History of stroke, MI, hypertension, diabetes, coronary artery disease, dementia, dialysis, CHF, GERD, ESRD, PVC. EXAM: PORTABLE CHEST 1 VIEW COMPARISON:  02/28/2015 FINDINGS: Cardiac enlargement without vascular congestion. No focal airspace disease or consolidation in the lungs. Focal scarring in the mid lung on the left. No blunting of costophrenic angles. No pneumothorax. Calcification of aorta. No change since prior study. IMPRESSION: Cardiac enlargement.  No evidence of active pulmonary disease. Electronically Signed   By: Burman Nieves M.D.   On: 04/13/2015 01:51    EKG:   Orders placed or performed during the hospital encounter of 04/13/15  . ED EKG  . ED EKG  . EKG 12-Lead  . EKG 12-Lead    IMPRESSION AND PLAN:  Principal Problem:   Acute encephalopathy - unclear etiology at this time. Possibly due  to heart failure, possibly some infection, though this seems less likely with a normal white blood cell count and normal chest x-ray. Possibly due to seizure activity. See below for respective treatments. Active Problems:   Seizures (HCC) - loaded with Keppra in the ED, continue Keppra IV afterwards. Neurology consult. EEG could potentially be useful.   Acute on chronic systolic CHF (congestive heart failure) [facility]) - cardiology consult for the same. Patient's blood pressure is on the low end so she is actually getting some fluids back. Some suspicion here for possible primary cardiac issue, we'll trend troponins as below.   Elevated troponin - patient does have some ST depressions on her EKG which seemed to compared to prior EKG. Concern for some diffuse ischemic changes, possibly related to demand. We'll trend her troponins and get a cardiology consult as above.   End stage renal disease on dialysis [facility]) - nephrology consult for hemodialysis support, as well as her opinion on the need for possible intervention on her fistula.   Type 2 diabetes mellitus with other circulatory complications (HCC) - sliding scale insulin and corresponding glucose checks   CAD (coronary artery disease) - continue home meds, cardiology consult another cardiac workup as above   Hyperlipidemia - continue home dose statin  All the records are reviewed and case discussed with ED provider. Management plans discussed with the patient and/or family.  DVT PROPHYLAXIS: SubQ heparin  GI PROPHYLAXIS: None  ADMISSION STATUS: Inpatient  CODE STATUS: Full Code Status History    Date Active Date Inactive Code Status Order ID Comments User Context   02/04/2015  5:09 PM 02/23/2015  9:12 PM Full Code [id number]  Altamese Dilling, MD Inpatient   02/04/2015 12:22 PM 02/04/2015  5:09 PM Full Code [id number]  Annice Needy, MD Inpatient   12/02/2014  5:37 PM 12/05/2014  6:47 PM Full Code [id number]  Houston Siren, MD Inpatient       TOTAL TIME TAKING CARE OF THIS PATIENT: 45 minutes.    Thursa Emme FIELDING 04/13/2015, 3:43 AM  [facility]  Office  [phone]  CC: Primary care physician; Amy Diana Eves, NP

## 2015-04-13 NOTE — ED Notes (Addendum)
Critical lab result reported to Dr. Dolores Frame.  Abnormal Result -  Troponin I: 0.12

## 2015-04-13 NOTE — ED Provider Notes (Signed)
[facility] Emergency Department Provider Note  ____________________________________________  Time seen: Approximately 1:23 AM  I have reviewed the triage vital signs and the nursing notes.   HISTORY  Chief Complaint Altered Mental Status  Limited by patient with decreased responsiveness  HPI [name] is a 70 y.o. female who presents to the ED from home via EMS with reports of unresponsive. Per EMS, family states they found patient on the couch "snoring loudly". Unknown time of onset. Patient with a history of CVA, CAD, ESRD on HD who received dialysis yesterday. Per report, "they couldn't get her blood to stop". EMS reports patient vomited once and became more alert. Rest of history is limited at this timeas there is no family at bedside. It is unknown as to what patient's baseline status is as far as mentation and ambulation.   Past Medical History  Diagnosis Date  . Stroke (HCC)   . MI (myocardial infarction) (HCC)   . Hearing impaired   . Hyperlipidemia   . Hypertension   . Diabetes mellitus without complication (HCC)   . Depression   . Coronary artery disease   . Dementia   . Secondary hyperparathyroidism (HCC)   . Dialysis patient [facility])     Monday, Wednesday and Friday.   . Chronic systolic CHF (congestive heart failure) (HCC)   . GERD (gastroesophageal reflux disease)   . Personal history of transient ischemic attack (TIA) and cerebral infarction without residual deficit   . Anemia   . Moderate aortic stenosis     a. echo 04/2014: 40-50%, moderate AS  . ESRD (end stage renal disease) [facility])     Patient Active Problem List   Diagnosis Date Noted  . Acute on chronic systolic CHF (congestive heart failure) (HCC) 04/13/2015  . Elevated troponin 04/13/2015  . Acute encephalopathy 04/13/2015  . CAD (coronary artery disease) 04/13/2015  . GERD (gastroesophageal reflux disease) 04/13/2015  . Metabolic encephalopathy   . Respiratory failure  (HCC)   . Dyspnea   . Severe aortic valve stenosis   . 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 NSTEMI (non-ST elevated myocardial infarction) (HCC)   . Cardiomyopathy, hypertensive, benign   . Angioedema 02/05/2015  . Acute respiratory failure (HCC) 02/05/2015  . Seizures (HCC) 12/02/2014  . Hyperlipidemia 08/11/2014  . End stage renal disease on dialysis (HCC) 05/12/2014  . Chronic systolic CHF (congestive heart failure) (HCC) 05/12/2014  . Secondary hypertension, unspecified 05/12/2014  . Type 2 diabetes mellitus with other circulatory complications (HCC) 05/12/2014  . Moderate aortic valve stenosis 05/12/2014    Past Surgical History  Procedure Laterality Date  . Cataract extraction, bilateral    . Wrist surgery    . Peripheral vascular catheterization Left 07/28/2014    Procedure: A/V Shuntogram/Fistulagram;  Surgeon: Renford Dills, MD;  Location: [facility] INVASIVE CV LAB;  Service: Cardiovascular;  Laterality: Left;  . Peripheral vascular catheterization Left 07/28/2014    Procedure: A/V Shunt Intervention;  Surgeon: Renford Dills, MD;  Location: [facility] INVASIVE CV LAB;  Service: Cardiovascular;  Laterality: Left;  . Peripheral vascular catheterization Left 02/04/2015    Procedure: A/V Shuntogram/Fistulagram;  Surgeon: Annice Needy, MD;  Location: [facility] INVASIVE CV LAB;  Service: Cardiovascular;  Laterality: Left;  . Peripheral vascular catheterization Left 02/04/2015    Procedure: A/V Shunt Intervention;  Surgeon: Annice Needy, MD;  Location: [facility] INVASIVE CV LAB;  Service: Cardiovascular;  Laterality: Left;  Current Outpatient Rx  Name  Route  Sig  Dispense  Refill  . aspirin EC 81 MG tablet   Oral   Take 81 mg by mouth daily.         [name] atorvastatin (LIPITOR) 80 MG tablet   Oral   Take 1 tablet (80 mg total) by mouth daily.   90 tablet   3   . bisacodyl (DULCOLAX) 10 MG  suppository   Rectal   Place 1 suppository (10 mg total) rectally daily as needed for moderate constipation.   12 suppository   0   . calcium acetate (PHOSLO) 667 MG capsule   Oral   Take 1,334 mg by mouth 3 (three) times daily with meals.          . cefTRIAXone (ROCEPHIN) 1 G SOLR injection   Intravenous   Inject 1 g into the vein 2 (two) times daily.   14 each   0   . clopidogrel (PLAVIX) 75 MG tablet   Oral   Take 1 tablet (75 mg total) by mouth daily.   30 tablet   3   . COD LIVER OIL PO   Oral   Take 530 mg by mouth daily.         [name] docusate sodium (COLACE) 100 MG capsule   Oral   Take 100 mg by mouth at bedtime.          . hydrALAZINE (APRESOLINE) 50 MG tablet   Oral   Take 1 tablet (50 mg total) by mouth every 8 (eight) hours.   90 tablet   0   . Influenza vac split quadrivalent PF (FLUARIX) 0.5 ML injection   Intramuscular   Inject 0.5 mLs into the muscle tomorrow at 10 am.   0.5 mL   0   . insulin aspart (NOVOLOG) 100 UNIT/ML injection   Subcutaneous   Inject 3-13 Units into the skin 3 (three) times daily with meals. Sliding scale         . isosorbide dinitrate (ISORDIL) 10 MG tablet   Oral   Take 1 tablet (10 mg total) by mouth 2 (two) times daily.   30 tablet   0   . lactobacillus acidophilus (BACID) TABS tablet   Oral   Take 1 tablet by mouth at bedtime.         . levETIRAcetam (KEPPRA) 100 MG/ML solution   Oral   Take 5 mLs (500 mg total) by mouth daily.   473 mL   12   . levETIRAcetam (KEPPRA) 100 MG/ML solution   Oral   Take 5 mLs (500 mg total) by mouth every Monday, Wednesday, and Friday at 6 PM.   473 mL   12   . lidocaine-prilocaine (EMLA) cream   Topical   Apply 1 application topically as needed (topical anesthesia for hemodialysis if Gebauers and Lidocaine injection are ineffective.).   30 g   0   . lisinopril (PRINIVIL,ZESTRIL) 20 MG tablet   Oral   Take 1 tablet (20 mg total) by mouth daily.   90 tablet    3   . lisinopril (PRINIVIL,ZESTRIL) 5 MG tablet   Oral   Take 5 mg by mouth daily.         [name] LORazepam (ATIVAN) 2 MG/ML concentrated solution   Oral   Take 2 mg by mouth as needed for anxiety.         . metoprolol tartrate (LOPRESSOR) 25 MG tablet   Oral  Take 0.5 tablets (12.5 mg total) by mouth 2 (two) times daily.   30 tablet   0   . Nutritional Supplements (FEEDING SUPPLEMENT, NEPRO CARB STEADY,) LIQD   Oral   Take 237 mLs by mouth 2 (two) times daily between meals.   60 Can   0   . Omega-3 Fatty Acids (FISH OIL) 500 MG CAPS   Oral   Take 500 mg by mouth 2 (two) times daily.         . pneumococcal 23 valent vaccine (PNU-IMMUNE) 25 MCG/0.5ML injection   Intramuscular   Inject 0.5 mLs into the muscle tomorrow at 10 am.   2.5 mL   0   . promethazine (PHENERGAN) 25 MG tablet   Oral   Take 25 mg by mouth every 6 (six) hours as needed for nausea or vomiting.         . ranitidine (ZANTAC) 150 MG tablet   Oral   Take 1 tablet (150 mg total) by mouth at bedtime.   90 tablet   3   . vitamin C (ASCORBIC ACID) 500 MG tablet   Oral   Take 500 mg by mouth 2 (two) times daily.          . Vitamin D, Ergocalciferol, (DRISDOL) 50000 UNITS CAPS capsule   Oral   Take 50,000 Units by mouth every 30 (thirty) days. Pt takes on the 7th of every month.           Allergies Contrast media  Family History  Problem Relation Age of Onset  . CAD Father     Social History Social History  Substance Use Topics  . Smoking status: Never Smoker   . Smokeless tobacco: None  . Alcohol Use: No    Review of Systems Constitutional: No fever/chills Eyes: No visual changes. ENT: No sore throat. Cardiovascular: Denies chest pain. Respiratory: Denies shortness of breath. Gastrointestinal: No abdominal pain.  Vomited x 1.  No diarrhea.  No constipation. Genitourinary: Negative for dysuria. Musculoskeletal: Negative for back pain. Skin: Negative for rash. Neurological:  Positive for unresponsive. Negative for headaches, focal weakness or numbness.  10-point ROS otherwise negative.  ____________________________________________   PHYSICAL EXAM:  VITAL SIGNS: ED Triage Vitals  Enc Vitals Group     BP 04/13/15 0117 105/56 mmHg     Pulse Rate 04/13/15 0117 84     Resp 04/13/15 0117 16     Temp 04/13/15 0117 97.6 F (36.4 C)     Temp Source 04/13/15 0117 Oral     SpO2 04/13/15 0117 95 %     Weight 04/13/15 0117 130 lb (58.968 kg)     Height 04/13/15 0117 5\' 6"  (1.676 m)     Head Cir --      Peak Flow --      Pain Score --      Pain Loc --      Pain Edu? --      Excl. in GC? --     Constitutional: Alert, eyes open spontaneously. Chronically ill appearing and in mild acute distress. Eyes: Conjunctivae are normal. PERRL. EOMI. Head: Atraumatic. Nose: No congestion/rhinnorhea. Mouth/Throat: Mucous membranes are mildly dry.  Oropharynx non-erythematous.  Edentulous. Neck: No stridor.   Cardiovascular: Normal rate, regular rhythm. Grossly normal heart sounds.  Good peripheral circulation. Respiratory: Normal respiratory effort.  No retractions. Lungs CTAB. Gastrointestinal: Soft and nontender. No distention. No abdominal bruits. No CVA tenderness. Musculoskeletal: No lower extremity tenderness nor edema.  No joint effusions.  Neurologic:  Spontaneous eye opening. Patient is nonverbal. Weak hand grip on the right compared to left side. Bilateral legs flaccid. Skin:  Skin is warm, dry and intact. No rash noted. Psychiatric: Mood and affect are normal. Speech and behavior are normal.  ____________________________________________   LABS (all labs ordered are listed, but only abnormal results are displayed)  Labs Reviewed  CBC WITH DIFFERENTIAL/PLATELET - Abnormal; Notable for the following:    RBC 3.43 (*)    Hemoglobin 10.6 (*)    HCT 32.6 (*)    RDW 24.9 (*)    Platelets 125 (*)    Lymphs Abs 0.6 (*)    All other components within normal  limits  COMPREHENSIVE METABOLIC PANEL - Abnormal; Notable for the following:    Sodium 134 (*)    Chloride 91 (*)    Glucose, Bld 271 (*)    BUN 30 (*)    Creatinine, Ser 3.43 (*)    Calcium 8.3 (*)    Total Protein 6.4 (*)    Albumin 3.3 (*)    GFR calc non Af Amer 13 (*)    GFR calc Af Amer 15 (*)    Anion gap 18 (*)    All other components within normal limits  TROPONIN I - Abnormal; Notable for the following:    Troponin I 0.12 (*)    All other components within normal limits  BRAIN NATRIURETIC PEPTIDE - Abnormal; Notable for the following:    B Natriuretic Peptide >4500.0 (*)    All other components within normal limits  LACTIC ACID, PLASMA - Abnormal; Notable for the following:    Lactic Acid, Venous 5.7 (*)    All other components within normal limits  LACTIC ACID, PLASMA - Abnormal; Notable for the following:    Lactic Acid, Venous 3.3 (*)    All other components within normal limits  BLOOD GAS, ARTERIAL - Abnormal; Notable for the following:    pH, Arterial 7.64 (*)    pCO2 arterial 29 (*)    pO2, Arterial 63 (*)    Bicarbonate 31.2 (*)    Acid-Base Excess 10.1 (*)    Allens test (pass/fail) POSITIVE (*)    All other components within normal limits  CULTURE, BLOOD (ROUTINE X 2)  CULTURE, BLOOD (ROUTINE X 2)   ____________________________________________  EKG  ED ECG REPORT I, Amanii Snethen J, the attending physician, personally viewed and interpreted this ECG.   Date: 04/13/2015  EKG Time: 0115  Rate: 86  Rhythm: normal EKG, normal sinus rhythm  Axis: LAD  Intervals:none  ST&T Change: ST depression inferior lateral leads  ____________________________________________  RADIOLOGY  CT head interpreted per Dr. Andria Meuse: No acute intracranial abnormalities. Chronic atrophy and small vessel ischemic changes. 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  Portable chest x-ray (viewed by me, interpreted per Dr. Andria Meuse): Cardiac  enlargement. No evidence of active pulmonary disease. ____________________________________________   PROCEDURES  Procedure(s) performed: None  Critical Care performed: Yes, see critical care note(s)   CRITICAL CARE Performed by: Irean Hong   Total critical care time: 45 minutes  Critical care time was exclusive of separately billable procedures and treating other patients.  Critical care was necessary to treat or prevent imminent or life-threatening deterioration.  Critical care was time spent personally by me on the following activities: development of treatment plan with patient and/or surrogate as well as nursing, discussions with consultants, evaluation of patient's response to treatment, examination of patient, obtaining history from patient or  surrogate, ordering and performing treatments and interventions, ordering and review of laboratory studies, ordering and review of radiographic studies, pulse oximetry and re-evaluation of patient's condition.  ____________________________________________   INITIAL IMPRESSION / ASSESSMENT AND PLAN / ED COURSE  Pertinent labs & imaging results that were available during my care of the patient were reviewed by me and considered in my medical decision making (see chart for details).  70 year old female with a history of CVA, end-stage renal disease on hemodialysis, CAD, seizures who presents with decreased responsiveness. Patient taken emergently for stat CT head. Will obtain screening lab work including lactate, ABG and chest x-ray.  ----------------------------------------- 2:34 AM on 04/13/2015 -----------------------------------------  Further information obtained by patient's daughter who is her power of attorney. Daughter states patient was discharged from [facility] facility approximately 1-1/2 weeks ago. Over the past 2 months she has seen a steady decline in her mother as far as dementia and generalized weakness.  She was not walking at the nursing facility. She has "good days and bad days". Sometimes she is verbal and sometimes she is not. She was discharged from the nursing facility because insurance paying for her stay. Daughter states she noted patient with sonorous respirations went to check on her and found patient unresponsive. I have updated her of imaging and laboratory results. I question whether patient had seizure prior to her daughter finding her with sonorous respirations and post ictal state. I did also confirm with her daughter that patient is indeed not allergic to oxygen; rather, she is allergic to IV dye which caused her prior episode of anaphylaxis on an earlier hospitalization. Daughter also states dialysis worse prolonged yesterday and she was told patient "was bleeding a lot" from her fistula.  ----------------------------------------- 2:42 AM on 04/13/2015 -----------------------------------------  Notified of elevated lactate and troponin. Will obtain blood cultures 2. Suspect patient may have had seizure causing elevated lactate. There is no fever nor evidence of active infection. Room air oxygen saturation wavering between 80% to 99%. Blow-by oxygen applied with saturations 100%. ____________________________________________   FINAL CLINICAL IMPRESSION(S) / ED DIAGNOSES  Final diagnoses:  Metabolic encephalopathy  NSTEMI (non-ST elevated myocardial infarction) (HCC)  Altered mental status, unspecified altered mental status type  Seizures (HCC)  Hyperlipidemia  End stage renal disease on dialysis (HCC)  Chronic systolic CHF (congestive heart failure) (HCC)  Secondary hypertension, unspecified  Type 2 diabetes mellitus with other circulatory complications (HCC)      Irean Hong, MD 04/13/15 0530

## 2015-04-13 NOTE — ED Notes (Signed)
Critical results reported to Dr. Dolores Frame. Abnormal Result -  Lactic Acid, Venous: 5.7 (CRITICAL RESULT CALLED TO, READ BACK BY AND VERIFIED WITH  Tyleek Smick ON 04/13/15 AT 0212 [facility]  ) [Range: 0.5 - 2.0 mmol/L]  Collected: 04/13/2015 01:29 Last updated: 04/13/2015 02:12

## 2015-04-13 NOTE — Progress Notes (Signed)
PHARMACY - CRITICAL CARE PROGRESS NOTE  Pharmacy Consult for Renal adjustment    Allergies  Allergen Reactions  . Contrast Media [Iodinated Diagnostic Agents] Anaphylaxis    Patient Measurements: Height:  (162.6 cm) Weight: 127 lb 3.3 oz (57.7 kg) IBW/kg (Calculated) : 54.7   Vital Signs: Temp: 98.1 F (36.7 C) (01/24 1200) Temp Source: Oral (01/24 1200) BP: 112/73 mmHg (01/24 1400) Pulse Rate: 83 (01/24 1400) Intake/Output from previous day:   Intake/Output from this shift: Total I/O In: 3 [I.V.:3] Out: -  Vent settings for last 24 hours:    Labs:  Recent Labs  04/13/15 0128 04/13/15 0748  WBC 6.4 8.5  HGB 10.6* 11.3*  HCT 32.6* 34.5*  PLT 125* 123*  CREATININE 3.43* 3.92*  ALBUMIN 3.3*  --   PROT 6.4*  --   AST 28  --   ALT 19  --   ALKPHOS 93  --   BILITOT 0.8  --    Estimated Creatinine Clearance: 11.7 mL/min (by C-G formula based on Cr of 3.92).   Recent Labs  04/13/15 0737 04/13/15 1137  GLUCAP 228* 200*    Microbiology: Recent Results (from the past 720 hour(s))  Culture, blood (routine x 2)     Status: None (Preliminary result)   Collection Time: 04/13/15  2:26 AM  Result Value Ref Range Status   Specimen Description BLOOD BLOOD RIGHT FOREARM  Final   Special Requests BOTTLES DRAWN AEROBIC AND ANAEROBIC  Final   Culture NO GROWTH < 12 HOURS  Final   Report Status PENDING  Incomplete  Culture, blood (routine x 2)     Status: None (Preliminary result)   Collection Time: 04/13/15  2:50 AM  Result Value Ref Range Status   Specimen Description BLOOD RIGHT HAND  Final   Special Requests BOTTLES DRAWN AEROBIC AND ANAEROBIC  Final   Culture NO GROWTH < 12 HOURS  Final   Report Status PENDING  Incomplete  MRSA PCR Screening     Status: None   Collection Time: 04/13/15  6:18 AM  Result Value Ref Range Status   MRSA by PCR NEGATIVE NEGATIVE Final    Comment:        The GeneXpert MRSA Assay (FDA approved for NASAL  specimens only), is one component of a comprehensive MRSA colonization surveillance program. It is not intended to diagnose MRSA infection nor to guide or monitor treatment for MRSA infections.     Medications:  Scheduled:  . aspirin EC  81 mg Oral Daily  . atorvastatin  80 mg Oral Daily  . clopidogrel  75 mg Oral Daily  . heparin  5,000 Units Subcutaneous 3 times per day  . insulin aspart  0-9 Units Subcutaneous Q6H  . [START ON 04/14/2015] levETIRAcetam  500 mg Intravenous Q24H  . [START ON 04/14/2015] levETIRAcetam  500 mg Intravenous Q M,W,F-2000  . sodium chloride  3 mL Intravenous Q12H   Infusions:    Assessment: Pharmacy consulted for renal adjustment of medications for 70 yo female ICU patient receiving regularly scheduled dialysis on Monday/Wednesday/Friday.    Plan:  Will continue patient on Keppra  Q24hr and supplemental  to be given after dialysis on dialysis days.   Pharmacy will continue to monitor and adjust per consult.   Simpson,Michael L 04/13/2015,3:57 PM

## 2015-04-13 NOTE — Progress Notes (Signed)
[facility]  ROUNDING NOTE   Subjective:   Admitted for acute encephalopathy. Last hemodialysis was Monday, her AVF was having prolonged bleeding.   Objective:  Vital signs in last 24 hours:  Temp:  [96.8 F (36 C)-99.1 F (37.3 C)] 97.8 F (36.6 C) (01/24 1600) Pulse Rate:  [72-90] 72 (01/24 1600) Resp:  [13-32] 17 (01/24 1600) BP: (82-115)/(56-75) 105/63 mmHg (01/24 1600) SpO2:  [94 %-100 %] 100 % (01/24 1600) Weight:  [57.7 kg (127 lb 3.3 oz)-58.968 kg (130 lb)] 57.7 kg (127 lb 3.3 oz) (01/24 0600)  Weight change:  Filed Weights   04/13/15 0117 04/13/15 0600  Weight: 58.968 kg (130 lb) 57.7 kg (127 lb 3.3 oz)    Intake/Output:     Intake/Output this shift:  Total I/O In: 3 [I.V.:3] Out: -   Physical Exam: General: NAD, laying in bed  Head: Normocephalic, atraumatic. Moist oral mucosal membranes  Eyes: Anicteric, PERRL  Neck: Supple, trachea midline  Lungs:  Clear to auscultation  Heart: Regular rate and rhythm  Abdomen:  Soft, nontender,   Extremities: no peripheral edema.  Neurologic: Nonfocal, moving all four extremities  Skin: No lesions  Access: Left AVG    Basic Metabolic Panel:  Recent Labs Lab 04/13/15 0128 04/13/15 0748  NA 134* 136  K 3.9 3.8  CL 91* 92*  CO2 25 27  GLUCOSE 271* 244*  BUN 30* 34*  CREATININE 3.43* 3.92*  CALCIUM 8.3* 8.8*    Liver Function Tests:  Recent Labs Lab 04/13/15 0128  AST 28  ALT 19  ALKPHOS 93  BILITOT 0.8  PROT 6.4*  ALBUMIN 3.3*   No results for input(s): LIPASE, AMYLASE in the last 168 hours. No results for input(s): AMMONIA in the last 168 hours.  CBC:  Recent Labs Lab 04/13/15 0128 04/13/15 0748  WBC 6.4 8.5  NEUTROABS 5.1  --   HGB 10.6* 11.3*  HCT 32.6* 34.5*  MCV 95.1 96.0  PLT 125* 123*    Cardiac Enzymes:  Recent Labs Lab 04/13/15 0128 04/13/15 0748 04/13/15 1248  TROPONINI 0.12* 1.05* 3.44*    BNP: Invalid input(s): POCBNP  CBG:  Recent Labs Lab  04/13/15 0737 04/13/15 1137 04/13/15 1608  GLUCAP 228* 200* 170*    Microbiology: Results for orders placed or performed during the hospital encounter of 04/13/15  Culture, blood (routine x 2)     Status: None (Preliminary result)   Collection Time: 04/13/15  2:26 AM  Result Value Ref Range Status   Specimen Description BLOOD BLOOD RIGHT FOREARM  Final   Special Requests BOTTLES DRAWN AEROBIC AND ANAEROBIC  Final   Culture NO GROWTH < 12 HOURS  Final   Report Status PENDING  Incomplete  Culture, blood (routine x 2)     Status: None (Preliminary result)   Collection Time: 04/13/15  2:50 AM  Result Value Ref Range Status   Specimen Description BLOOD RIGHT HAND  Final   Special Requests BOTTLES DRAWN AEROBIC AND ANAEROBIC  Final   Culture NO GROWTH < 12 HOURS  Final   Report Status PENDING  Incomplete  MRSA PCR Screening     Status: None   Collection Time: 04/13/15  6:18 AM  Result Value Ref Range Status   MRSA by PCR NEGATIVE NEGATIVE Final    Comment:        The GeneXpert MRSA Assay (FDA approved for NASAL specimens only), is one component of a comprehensive MRSA colonization surveillance program. It is not  intended to diagnose MRSA infection nor to guide or monitor treatment for MRSA infections.     Coagulation Studies: No results for input(s): LABPROT, INR in the last 72 hours.  Urinalysis: No results for input(s): COLORURINE, LABSPEC, PHURINE, GLUCOSEU, HGBUR, BILIRUBINUR, KETONESUR, PROTEINUR, UROBILINOGEN, NITRITE, LEUKOCYTESUR in the last 72 hours.  Invalid input(s): APPERANCEUR    Imaging: Ct Head Wo Contrast  04/13/2015  CLINICAL DATA:  Decreased level of consciousness. Unresponsive. Patient was laying on couch and woke family with loud snoring. EXAM: CT HEAD WITHOUT CONTRAST TECHNIQUE: Contiguous axial images were obtained from the base of the skull through the vertex without intravenous contrast. COMPARISON:  02/28/2015 FINDINGS: Diffuse cerebral  atrophy. Ventricular dilatation consistent with central atrophy. Low-attenuation changes throughout the deep white matter consistent with small vessel ischemia. Old lacunar infarcts in the basal ganglia and thalamus bilaterally. Focal area of old encephalomalacia in the left posterior parietal and occipital lobes consistent with old infarcts. No significant change since prior study. No abnormal extra-axial fluid collections. Gray-white matter junctions are distinct. Basal cisterns are not effaced. No acute intracranial hemorrhage. Calvarium appears intact. Visualized paranasal sinuses and mastoid air cells are not opacified. Vascular calcifications. IMPRESSION: No acute intracranial abnormalities. Chronic atrophy and small vessel ischemic changes. 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 Electronically Signed   By: Burman Nieves M.D.   On: 04/13/2015 01:44   Dg Chest Port 1 View  04/13/2015  CLINICAL DATA:  Patient is unresponsive. History of stroke, MI, hypertension, diabetes, coronary artery disease, dementia, dialysis, CHF, GERD, ESRD, PVC. EXAM: PORTABLE CHEST 1 VIEW COMPARISON:  02/28/2015 FINDINGS: Cardiac enlargement without vascular congestion. No focal airspace disease or consolidation in the lungs. Focal scarring in the mid lung on the left. No blunting of costophrenic angles. No pneumothorax. Calcification of aorta. No change since prior study. IMPRESSION: Cardiac enlargement.  No evidence of active pulmonary disease. Electronically Signed   By: Burman Nieves M.D.   On: 04/13/2015 01:51     Medications:     . aspirin EC  81 mg Oral Daily  . atorvastatin  80 mg Oral Daily  . clopidogrel  75 mg Oral Daily  . heparin  5,000 Units Subcutaneous 3 times per day  . insulin aspart  0-9 Units Subcutaneous Q6H  . [START ON 04/14/2015] levETIRAcetam  500 mg Intravenous Q24H  . [START ON 04/14/2015] levETIRAcetam  500 mg Intravenous Q M,W,F-2000  . sodium chloride  3 mL  Intravenous Q12H   ondansetron **OR** ondansetron (ZOFRAN) IV  Assessment/ Plan:  [name] is a 70 y.o. black female with end-stage renal disease, myocardial infarction, CVA, hyperlipidemia, hypertension, diabetes mellitus, depression, secondary hyperparathyroidism, dementia, GERD, anemia chronic kidney disease   [facility] MWF [facility] [address]   1. End-stage renal disease on hemodialysis. No acute indication for dialysis. Next treatment for tomorrow.  - Continue MWF schedule.  - AVG   2. Anemia chronic kidney disease. Hemoglobin11.3. - Continue to hold Epogen.   3. Secondary hyperparathyroidism. - holding calcium acetate   4. Hypertension: blood pressure at goal.  - holding metoprolol, isosorbide dinitrate, hydralazine  5. Diabetes mellitus type II with chronic kidney disease  - Continue glucose control.    LOS: 0 Ramzi Brathwaite 1/24/20174:28 PM

## 2015-04-13 NOTE — ED Notes (Signed)
Patient transported to CT via stretcher with RN

## 2015-04-13 NOTE — ED Notes (Signed)
Pt returned to ED

## 2015-04-13 NOTE — ED Notes (Addendum)
Reported critical result to Dr. Dolores Frame.  Abnormal Result -  Lactic Acid, Venous: 3.3 (CRITICAL RESULT CALLED TO, READ BACK BY AND VERIFIED WITH  Milah Recht ON 04/13/15 AT 0512 [facility]  ) [Range: 0.5 - 2.0 mmol/L]  Collected: 04/13/2015 04:29 Last updated: 04/13/2015 05:11

## 2015-04-13 NOTE — Progress Notes (Signed)
ANTICOAGULATION CONSULT NOTE - Initial Consult  Pharmacy Consult for Heparin Indication: chest pain/ACS  Allergies  Allergen Reactions  . Contrast Media [Iodinated Diagnostic Agents] Anaphylaxis    Patient Measurements: Height:  (162.6 cm) Weight: 127 lb 3.3 oz (57.7 kg) IBW/kg (Calculated) : 54.7   Vital Signs: Temp: 97.8 F (36.6 C) (01/24 1600) Temp Source: Oral (01/24 1600) BP: 105/61 mmHg (01/24 1900) Pulse Rate: 80 (01/24 1700)  Labs:  Recent Labs  04/13/15 0128 04/13/15 0748 04/13/15 1248 04/13/15 1929  HGB 10.6* 11.3*  --   --   HCT 32.6* 34.5*  --   --   PLT 125* 123*  --   --   CREATININE 3.43* 3.92*  --  4.22*  TROPONINI 0.12* 1.05* 3.44* 7.70*    Estimated Creatinine Clearance: 10.9 mL/min (by C-G formula based on Cr of 4.22).   Medical History: Past Medical History  Diagnosis Date  . Stroke (HCC)   . MI (myocardial infarction) (HCC)   . Hearing impaired   . Hyperlipidemia   . Hypertension   . Diabetes mellitus without complication (HCC)   . Depression   . Coronary artery disease   . Dementia   . Secondary hyperparathyroidism (HCC)   . Dialysis patient [facility])     Monday, Wednesday and Friday.   . Chronic systolic CHF (congestive heart failure) (HCC)   . GERD (gastroesophageal reflux disease)   . Personal history of transient ischemic attack (TIA) and cerebral infarction without residual deficit   . Anemia   . Moderate aortic stenosis     a. echo 04/2014: 40-50%, moderate AS  . ESRD (end stage renal disease) (HCC)     Medications:  Scheduled:  . aspirin EC  81 mg Oral Daily  . atorvastatin  80 mg Oral Daily  . clopidogrel  75 mg Oral Daily  . heparin  3,500 Units Intravenous Once  . insulin aspart  0-9 Units Subcutaneous Q6H  . [START ON 04/14/2015] levETIRAcetam  500 mg Intravenous Q24H  . [START ON 04/14/2015] levETIRAcetam  500 mg Intravenous Q M,W,F-2000  . sodium chloride  3 mL Intravenous Q12H   Infusions:  . heparin       Assessment: 70 y/o F with elevated troponin, continuing to rise. Patient received last dose of [place] heparin at 1449.   Goal of Therapy:  Heparin level 0.3-0.7 units/ml Monitor platelets by anticoagulation protocol: Yes   Plan:  Give 3500 units bolus x 1 Start heparin infusion at 700 units/hr Check anti-Xa level in 8 hours and daily while on heparin Continue to monitor H&H and platelets  Luisa Hart D 04/13/2015,10:04 PM

## 2015-04-13 NOTE — Progress Notes (Signed)
Chaplain rounded in the unit and provided a compassionate presence and support through silent prayer.  Jefm Petty [phone]

## 2015-04-13 NOTE — Progress Notes (Signed)
Called by nurse with critical troponin of 7...continuing to rise.  Seen by cardiology today, advise medical management of ACS as pt not likely good candidate for acute intervention and absolute goals of treatment unclear.  Will start heparin gtt at this time.  Pt has also had bleeding from fistula during dialysis, which has been difficult to control.  AM team can decide with cardiology and nephrology if they need to stop heparin prior to dialysis.  Kristeen Miss [facility] 04/13/2015, 9:54 PM

## 2015-04-13 NOTE — ED Notes (Signed)
Consult MD at bedside.

## 2015-04-13 NOTE — Consult Note (Signed)
CARDIOLOGY CONSULT NOTE   Patient ID: [name] MRN: [id number], DOB/AGE: 70-06-1945   Admit date: 04/13/2015 Date of Consult: 04/13/2015 Reason for Consult: Elevated Troponin Physician Requesting Consult: Dr. Anne Hahn   Primary Physician: Filbert Berthold, NP Primary Cardiologist: Dr. Mariah Milling  HPI: [name] is a 70 y.o. female with past medical history of CAD (s/p NSTEMI 04/2014 - medically managed), chronic systolic CHF (EF 40-98% by echo in 01/2015), severe aortic stenosis, ESRD (on HD M/W/F), CVA, Type 2 DM, HTN, HLD, Seizure Disorder and chronic anemia who presented to [facility] due to her family noting she was less responsive and was lethargic yesterday afternoon.  The patient is alert and awake at the time of this encounter, but is only saying "thank you" when asked a question. According to her nurse, she was non-verbal this morning, therefore this is an improvement compared to earlier today. History is obtained by the staff caring for the patient and by review of her medical records in [facility].  In looking at records, it appears she received HD yesterday but upon family seeing her she was breathing heavily and was less responsive than normal, therefore they called EMS. They also reported she had a "shaking" episode in her arms and kept turning her head to the side, similar to previous seizure episodes. En route to the hospital she had one episode of vomiting.  While in the ED, BNP was elevated to > 4500. Initial troponin 0.12, with repeat values becoming elevated to 1.05 and 3.44. Creatinine at 3.43.Hgb stable at 10.6.WBC 6.4. Platelets 125. Lactic Acid elevated to 5.7 with repeat values trending down to 3.3. CXR showed cardiac enlargement with no evidence of active cardiopulmonary disease. EKG shows more prominent TWI in I, II, V5, and V6 when compared to previous tracings and ST elevation in AVR and V1.   She was last hospitalized from 02/04/2015 to 02/23/2015 for acute metabolic  encephalopathy secondary to anoxic brain injury. During that admission, her troponin peaked to 3.26. Her echocardiogram at that time showed an EF of 35% to 40% with severe aortic stenosis noted (Valve area (VTI): 0.44 cm^2. Valve area (Vmax):0.39 cm^2. Valve area (Vmean): 0.41 cm^2). Due to her acute illness, it was decided she would not be a surgical candidate for aortic valve replacement at that time. In regards to her troponin elevation, no ischemic evaluation was recommended at that time due to her acute illness.  Problem List Past Medical History  Diagnosis Date  . Stroke (HCC)   . MI (myocardial infarction) (HCC)   . Hearing impaired   . Hyperlipidemia   . Hypertension   . Diabetes mellitus without complication (HCC)   . Depression   . Coronary artery disease   . Dementia   . Secondary hyperparathyroidism (HCC)   . Dialysis patient [facility])     Monday, Wednesday and Friday.   . Chronic systolic CHF (congestive heart failure) (HCC)   . GERD (gastroesophageal reflux disease)   . Personal history of transient ischemic attack (TIA) and cerebral infarction without residual deficit   . Anemia   . Moderate aortic stenosis     a. echo 04/2014: 40-50%, moderate AS  . ESRD (end stage renal disease) [facility])     Past Surgical History  Procedure Laterality Date  . Cataract extraction, bilateral    . Wrist surgery    . Peripheral vascular catheterization Left 07/28/2014    Procedure: A/V Shuntogram/Fistulagram;  Surgeon: Renford Dills, MD;  Location: [facility] INVASIVE CV LAB;  Service: Cardiovascular;  Laterality: Left;  . Peripheral vascular catheterization Left 07/28/2014    Procedure: A/V Shunt Intervention;  Surgeon: Renford Dills, MD;  Location: [facility] INVASIVE CV LAB;  Service: Cardiovascular;  Laterality: Left;  . Peripheral vascular catheterization Left 02/04/2015    Procedure: A/V Shuntogram/Fistulagram;  Surgeon: Annice Needy, MD;  Location: [facility] INVASIVE CV LAB;  Service:  Cardiovascular;  Laterality: Left;  . Peripheral vascular catheterization Left 02/04/2015    Procedure: A/V Shunt Intervention;  Surgeon: Annice Needy, MD;  Location: [facility] INVASIVE CV LAB;  Service: Cardiovascular;  Laterality: Left;     Allergies Allergies  Allergen Reactions  . Contrast Media [Iodinated Diagnostic Agents] Anaphylaxis      Inpatient Medications . aspirin EC  81 mg Oral Daily  . atorvastatin  80 mg Oral Daily  . clopidogrel  75 mg Oral Daily  . heparin  5,000 Units Subcutaneous 3 times per day  . insulin aspart  0-9 Units Subcutaneous Q6H  . [START ON 04/14/2015] levETIRAcetam  500 mg Intravenous Q24H  . [START ON 04/14/2015] levETIRAcetam  500 mg Intravenous Q M,W,F-2000  . sodium chloride  3 mL Intravenous Q12H    Family History Family History  Problem Relation Age of Onset  . CAD Father      Social History Social History   Social History  . Marital Status: Widowed    Spouse Name: N/A  . Number of Children: N/A  . Years of Education: N/A   Occupational History  . Not on file.   Social History Main Topics  . Smoking status: Never Smoker   . Smokeless tobacco: Not on file  . Alcohol Use: No  . Drug Use: No  . Sexual Activity: Not on file   Other Topics Concern  . Not on file   Social History Narrative     Review of Systems: Unable to be obtained due to current mental status. Patient is alert, but not responding to questions.  Physical Exam Blood pressure 113/69, pulse 82, temperature 98.1 F (36.7 C), temperature source Oral, resp. rate 14, height 5\' 4"  (1.626 m), weight 127 lb 3.3 oz (57.7 kg), SpO2 100 %.  General: Elderly African American female, resting comfortably in bed, appearing in NAD Psych: Normal affect. Neuro: Moves all extremities spontaneously. Responds "thank you" to statements. Unable to provide response to questions. HEENT: Normal  Neck: Supple without bruits or JVD. Lungs:  Resp regular and unlabored, CTA without  wheezing or rales. Heart: RRR no s3, s4, 4/6 SEM heard throughout, loudest at RUSB. Abdomen: Soft, non-tender, non-distended, BS + x 4.  Extremities: No clubbing, cyanosis or edema. DP/PT/Radials 2+ and equal bilaterally. Wound dressing and boots in place.  Labs   Recent Labs  04/13/15 0128 04/13/15 0748 04/13/15 1248  TROPONINI 0.12* 1.05* 3.44*   Lab Results  Component Value Date   WBC 8.5 04/13/2015   HGB 11.3* 04/13/2015   HCT 34.5* 04/13/2015   MCV 96.0 04/13/2015   PLT 123* 04/13/2015    Recent Labs Lab 04/13/15 0128 04/13/15 0748  NA 134* 136  K 3.9 3.8  CL 91* 92*  CO2 25 27  BUN 30* 34*  CREATININE 3.43* 3.92*  CALCIUM 8.3* 8.8*  PROT 6.4*  --   BILITOT 0.8  --   ALKPHOS 93  --   ALT 19  --   AST 28  --   GLUCOSE 271* 244*   Lab Results  Component Value Date   CHOL 256* 04/22/2014  HDL 73* 04/22/2014   LDLCALC 179* 04/22/2014   TRIG 32 02/07/2015    Radiology/Studies  Ct Head Wo Contrast: 04/13/2015  CLINICAL DATA:  Decreased level of consciousness. Unresponsive. Patient was laying on couch and woke family with loud snoring. EXAM: CT HEAD WITHOUT CONTRAST TECHNIQUE: Contiguous axial images were obtained from the base of the skull through the vertex without intravenous contrast. COMPARISON:  02/28/2015 FINDINGS: Diffuse cerebral atrophy. Ventricular dilatation consistent with central atrophy. Low-attenuation changes throughout the deep white matter consistent with small vessel ischemia. Old lacunar infarcts in the basal ganglia and thalamus bilaterally. Focal area of old encephalomalacia in the left posterior parietal and occipital lobes consistent with old infarcts. No significant change since prior study. No abnormal extra-axial fluid collections. Gray-white matter junctions are distinct. Basal cisterns are not effaced. No acute intracranial hemorrhage. Calvarium appears intact. Visualized paranasal sinuses and mastoid air cells are not opacified. Vascular  calcifications. IMPRESSION: No acute intracranial abnormalities. Chronic atrophy and small vessel ischemic changes. 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 Electronically Signed   By: Burman Nieves M.D.   On: 04/13/2015 01:44   Dg Chest Port 1 View: 04/13/2015  CLINICAL DATA:  Patient is unresponsive. History of stroke, MI, hypertension, diabetes, coronary artery disease, dementia, dialysis, CHF, GERD, ESRD, PVC. EXAM: PORTABLE CHEST 1 VIEW COMPARISON:  02/28/2015 FINDINGS: Cardiac enlargement without vascular congestion. No focal airspace disease or consolidation in the lungs. Focal scarring in the mid lung on the left. No blunting of costophrenic angles. No pneumothorax. Calcification of aorta. No change since prior study. IMPRESSION: Cardiac enlargement.  No evidence of active pulmonary disease. Electronically Signed   By: Burman Nieves M.D.   On: 04/13/2015 01:51    ECG: NSR, HR 81, TWI in I, II, V5, and V6 (more prominent when compared to previous tracings). ST elevation in AVR and V1.   ECHOCARDIOGRAM: 02/08/2015 Study Conclusions - Left ventricle: There was moderate concentric hypertrophy. Systolic function was moderately reduced. The estimated ejection fraction was in the range of 35% to 40%. - Aortic valve: There was severe stenosis. There was mild regurgitation. Valve area (VTI): 0.44 cm^2. Valve area (Vmax): 0.39 cm^2. Valve area (Vmean): 0.41 cm^2. - Mitral valve: There was mild regurgitation. - Left atrium: The atrium was mildly dilated. - Right atrium: The atrium was mildly dilated. - Tricuspid valve: There was moderate regurgitation. - Pulmonic valve: Peak gradient (S): 24 mm Hg.  ASSESSMENT AND PLAN  1. Elevated Troponin - occurring in the setting of acute encephalopathy. - Initial troponin 0.12, with repeat values becoming elevated to 1.05 and 3.44. - EKG shows more prominent TWI in I, II, V5, and V6 when compared to previous tracings  and ST elevation in AVR and V1. Echo last admission showed EF of 35-40%.  - need to address Code Status with the patient and her daughter before pursing ischemic evaluation as her EKG shows concerns for worsening ischemia. MD to see to offer further evaluation.  2. Severe aortic stenosis: - Echo last admission showed severe aortic stenosis (Valve area (VTI): 0.44 cm^2. Valve area (Vmax):0.39 cm^2. Valve area (Vmean): 0.41 cm^2). - At her current state last admission was deemed not to be a candidate for valve replacement. Again, Code Status needs to be addressed with her Legal Guardian.  3. CAD/ Ischemic Cardiomyopathy - s/p NSTEMI 04/2014 - medically managed at that time. - unable to obtain ROS to determine if any recent chest pain or anginal equilavents. - please refer  to #1  4. ESRD - on HD (Monday,Wednesday, Friday schedule).  5. Acute encephalopathy - presented with "seizure-like" activity noted by family and was lethargic and less-responsive when compared to baseline. - per admitting team  Signed, Ellsworth Lennox, PA-C 04/13/2015, 2:12 PM Pager: [phone]

## 2015-04-13 NOTE — Progress Notes (Signed)
[facility] -  at [facility]   PATIENT NAME: [name]    MR#:  [id number]  DATE OF BIRTH:  18-Oct-1945  SUBJECTIVE:  CHIEF COMPLAINT:   Chief Complaint  Patient presents with  . Altered Mental Status  alert at this time. But keeps repeating same words at time. REVIEW OF SYSTEMS:  Review of Systems  Unable to perform ROS: mental acuity   DRUG ALLERGIES:   Allergies  Allergen Reactions  . Contrast Media [Iodinated Diagnostic Agents] Anaphylaxis   VITALS:  Blood pressure 114/86, pulse 80, temperature 97.8 F (36.6 C), temperature source Oral, resp. rate 20, height  (1.626 m), weight 57.7 kg (127 lb 3.3 oz), SpO2 100 %. PHYSICAL EXAMINATION:  Physical Exam  Constitutional: She is well-developed, well-nourished, and in no distress.  HENT:  Head: Normocephalic and atraumatic.  Eyes: Conjunctivae and EOM are normal. Pupils are equal, round, and reactive to light.  Neck: Normal range of motion. Neck supple. No tracheal deviation present. No thyromegaly present.  Cardiovascular: Normal rate and regular rhythm.   Murmur heard.  Systolic murmur is present  Pulmonary/Chest: Effort normal and breath sounds normal. No respiratory distress. She has no wheezes. She exhibits no tenderness.  Abdominal: Soft. Bowel sounds are normal. She exhibits no distension. There is no tenderness.  Musculoskeletal: Normal range of motion.  Neurological: She is alert. No cranial nerve deficit.  Not quite oriented, keep saying "thank you"  Skin: Skin is warm and dry. No rash noted.  Psychiatric: Mood and affect normal.   LABORATORY PANEL:   CBC  Recent Labs Lab 04/13/15 0748  WBC 8.5  HGB 11.3*  HCT 34.5*  PLT 123*   ------------------------------------------------------------------------------------------------------------------ Chemistries   Recent Labs Lab 04/13/15 0128 04/13/15 0748  NA 134* 136  K 3.9 3.8  CL 91* 92*  CO2 25 27  GLUCOSE  271* 244*  BUN 30* 34*  CREATININE 3.43* 3.92*  CALCIUM 8.3* 8.8*  AST 28  --   ALT 19  --   ALKPHOS 93  --   BILITOT 0.8  --    RADIOLOGY:  Ct Head Wo Contrast  04/13/2015  CLINICAL DATA:  Decreased level of consciousness. Unresponsive. Patient was laying on couch and woke family with loud snoring. EXAM: CT HEAD WITHOUT CONTRAST TECHNIQUE: Contiguous axial images were obtained from the base of the skull through the vertex without intravenous contrast. COMPARISON:  02/28/2015 FINDINGS: Diffuse cerebral atrophy. Ventricular dilatation consistent with central atrophy. Low-attenuation changes throughout the deep white matter consistent with small vessel ischemia. Old lacunar infarcts in the basal ganglia and thalamus bilaterally. Focal area of old encephalomalacia in the left posterior parietal and occipital lobes consistent with old infarcts. No significant change since prior study. No abnormal extra-axial fluid collections. Gray-white matter junctions are distinct. Basal cisterns are not effaced. No acute intracranial hemorrhage. Calvarium appears intact. Visualized paranasal sinuses and mastoid air cells are not opacified. Vascular calcifications. IMPRESSION: No acute intracranial abnormalities. Chronic atrophy and small vessel ischemic changes. 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 Electronically Signed   By: Burman Nieves M.D.   On: 04/13/2015 01:44   Dg Chest Port 1 View  04/13/2015  CLINICAL DATA:  Patient is unresponsive. History of stroke, MI, hypertension, diabetes, coronary artery disease, dementia, dialysis, CHF, GERD, ESRD, PVC. EXAM: PORTABLE CHEST 1 VIEW COMPARISON:  02/28/2015 FINDINGS: Cardiac enlargement without vascular congestion. No focal airspace disease or consolidation in the lungs. Focal scarring in the mid  lung on the left. No blunting of costophrenic angles. No pneumothorax. Calcification of aorta. No change since prior study. IMPRESSION: Cardiac  enlargement.  No evidence of active pulmonary disease. Electronically Signed   By: Burman Nieves M.D.   On: 04/13/2015 01:51   ASSESSMENT AND PLAN:  * Acute encephalopathy - unclear etiology at this time. Possibly due to heart failure, possibly some infection, though this seems less likely with a normal white blood cell count and normal chest x-ray. Possibly due to seizure activity.   * Seizures (HCC) - loaded with Keppra in the ED, continue Keppra IV afterwards. Neurology consult (will be seen in am). EEG could potentially be useful.  * Acute on chronic systolic CHF (congestive heart failure) (HCC) - Has severe AS - not an ideal candidate for surgery -- Without aortic valve surgery,patient's symptoms will continue to progress, worsening cardiac failure, shortness of breath, troponin elevations, near-syncopal syncope, difficulty with hemodialysis .  - will get palliative care c/s  * Elevated troponin - likely secondary to ischemia in the setting of coronary artery disease, ischemic heart myopathy, exacerbated by severe aortic valve stenosis.  * End stage renal disease on dialysis [facility]) - nephrology consult for hemodialysis support, as well as her opinion on the need for possible intervention on her fistula.  * Type 2 diabetes mellitus with other circulatory complications (HCC) - sliding scale insulin and corresponding glucose checks   * CAD (coronary artery disease) - continue home meds, cardiology consult another cardiac workup as above  * Hyperlipidemia - continue home dose statin     All the records are reviewed and case discussed with Care Management/Social Worker. Management plans discussed with the patient, family and they are in agreement.  CODE STATUS: FULL CODE  TOTAL TIME TAKING CARE OF THIS PATIENT: 35 minutes.   More than 50% of the time was spent in counseling/coordination of care: YES  POSSIBLE D/C IN 2-3 DAYS, DEPENDING ON CLINICAL CONDITION.   [facility], [place] Fritze M.D  on 04/13/2015 at 5:53 PM  Between 7am to 6pm - Pager - [phone]  After 6pm go to [email or web address] - password [email or web address] [facility]  [facility]  Office  [phone]  CC: Primary care physician; Amy L Krebs, NP  Note: This dictation was prepared with Dragon dictation along with smaller phrase technology. Any transcriptional errors that result from this process are unintentional.

## 2015-04-14 ENCOUNTER — Inpatient Hospital Stay: Payer: Medicare Other

## 2015-04-14 ENCOUNTER — Encounter: Payer: Self-pay | Admitting: Student

## 2015-04-14 LAB — GLUCOSE, CAPILLARY
GLUCOSE-CAPILLARY: 107 mg/dL — AB (ref 65–99)
GLUCOSE-CAPILLARY: 67 mg/dL (ref 65–99)
GLUCOSE-CAPILLARY: 88 mg/dL (ref 65–99)
GLUCOSE-CAPILLARY: 151 mg/dL — AB (ref 65–99)
Glucose-Capillary: 217 mg/dL — ABNORMAL HIGH (ref 65–99)

## 2015-04-14 LAB — HEPARIN LEVEL (UNFRACTIONATED): Heparin Unfractionated: 0.76 IU/mL — ABNORMAL HIGH (ref 0.30–0.70)

## 2015-04-14 LAB — CBC
HEMATOCRIT: 32.4 % — AB (ref 35.0–47.0)
HEMOGLOBIN: 10.5 g/dL — AB (ref 12.0–16.0)
MCH: 30.6 pg (ref 26.0–34.0)
MCHC: 32.4 g/dL (ref 32.0–36.0)
MCV: 94.4 fL (ref 80.0–100.0)
Platelets: 157 10*3/uL (ref 150–440)
RBC: 3.43 MIL/uL — AB (ref 3.80–5.20)
RDW: 25.3 % — ABNORMAL HIGH (ref 11.5–14.5)
WBC: 6.7 10*3/uL (ref 3.6–11.0)

## 2015-04-14 LAB — RENAL FUNCTION PANEL
Albumin: 3 g/dL — ABNORMAL LOW (ref 3.5–5.0)
Anion gap: 16 — ABNORMAL HIGH (ref 5–15)
BUN: 43 mg/dL — AB (ref 6–20)
CALCIUM: 9 mg/dL (ref 8.9–10.3)
CHLORIDE: 91 mmol/L — AB (ref 101–111)
CO2: 30 mmol/L (ref 22–32)
CREATININE: 4.79 mg/dL — AB (ref 0.44–1.00)
GFR calc Af Amer: 10 mL/min — ABNORMAL LOW (ref >60)
GFR calc non Af Amer: 8 mL/min — ABNORMAL LOW (ref >60)
GLUCOSE: 103 mg/dL — AB (ref 65–99)
Phosphorus: 4 mg/dL (ref 2.5–4.6)
Potassium: 3.7 mmol/L (ref 3.5–5.1)
SODIUM: 137 mmol/L (ref 135–145)

## 2015-04-14 MED ORDER — LEVETIRACETAM 500 MG/5ML IV SOLN
750.0000 mg | INTRAVENOUS | Status: DC
  Administered 2015-04-15 – 2015-04-16 (×2): 750 mg via INTRAVENOUS
  Filled 2015-04-14 (×3): qty 7.5

## 2015-04-14 MED ORDER — SODIUM CHLORIDE 0.9 % IV SOLN
500.0000 mg | INTRAVENOUS | Status: DC
  Administered 2015-04-14: 500 mg via INTRAVENOUS
  Filled 2015-04-14 (×2): qty 5

## 2015-04-14 MED ORDER — SODIUM CHLORIDE 0.9 % IV SOLN: 750.0000 mg | INTRAVENOUS | Status: DC

## 2015-04-14 MED ORDER — HEPARIN (PORCINE) IN NACL 100-0.45 UNIT/ML-% IJ SOLN
650.0000 [IU]/h | INTRAMUSCULAR | Status: DC
  Administered 2015-04-14: 650 [IU]/h via INTRAVENOUS

## 2015-04-14 NOTE — Progress Notes (Signed)
Pre-hd tx 

## 2015-04-14 NOTE — Progress Notes (Signed)
PHARMACY - CRITICAL CARE PROGRESS NOTE  Pharmacy Consult for Renal adjustment    Allergies  Allergen Reactions  . Contrast Media [Iodinated Diagnostic Agents] Anaphylaxis    Patient Measurements: Height:  (162.6 cm) Weight: 124 lb 9 oz (56.5 kg) IBW/kg (Calculated) : 54.7   Vital Signs: Temp: 97.5 F (36.4 C) (01/25 1448) Temp Source: Oral (01/25 1448) BP: 121/62 mmHg (01/25 1530) Pulse Rate: 71 (01/25 1530) Intake/Output from previous day: 01/24 0701 - 01/25 0700 In: 54.3 [I.V.:54.3] Out: -  Intake/Output from this shift: Total I/O In: 56.4 [I.V.:56.4] Out: 1000 [Other:1000] Vent settings for last 24 hours:    Labs:  Recent Labs  04/13/15 0128 04/13/15 0748 04/13/15 1929 04/13/15 2322 04/14/15 0914  WBC 6.4 8.5  --   --  6.7  HGB 10.6* 11.3*  --   --  10.5*  HCT 32.6* 34.5*  --   --  32.4*  PLT 125* 123*  --   --  157  APTT  --   --   --  39*  --   INR  --   --   --  1.23  --   CREATININE 3.43* 3.92* 4.22*  --  4.79*  PHOS  --   --   --   --  4.0  ALBUMIN 3.3*  --  3.4*  --  3.0*  PROT 6.4*  --  6.4*  --   --   AST 28  --  31  --   --   ALT 19  --  24  --   --   ALKPHOS 93  --  81  --   --   BILITOT 0.8  --  0.7  --   --    Estimated Creatinine Clearance: 9.6 mL/min (by C-G formula based on Cr of 4.79).   Recent Labs  04/14/15 0616 04/14/15 1610 04/14/15 1618  GLUCAP 107* 67 88    Microbiology: Recent Results (from the past 720 hour(s))  Culture, blood (routine x 2)     Status: None (Preliminary result)   Collection Time: 04/13/15  2:26 AM  Result Value Ref Range Status   Specimen Description BLOOD BLOOD RIGHT FOREARM  Final   Special Requests BOTTLES DRAWN AEROBIC AND ANAEROBIC  Final   Culture NO GROWTH 1 DAY  Final   Report Status PENDING  Incomplete  Culture, blood (routine x 2)     Status: None (Preliminary result)   Collection Time: 04/13/15  2:50 AM  Result Value Ref Range Status   Specimen Description BLOOD RIGHT HAND   Final   Special Requests BOTTLES DRAWN AEROBIC AND ANAEROBIC  Final   Culture NO GROWTH 1 DAY  Final   Report Status PENDING  Incomplete  MRSA PCR Screening     Status: None   Collection Time: 04/13/15  6:18 AM  Result Value Ref Range Status   MRSA by PCR NEGATIVE NEGATIVE Final    Comment:        The GeneXpert MRSA Assay (FDA approved for NASAL specimens only), is one component of a comprehensive MRSA colonization surveillance program. It is not intended to diagnose MRSA infection nor to guide or monitor treatment for MRSA infections.     Medications:  Scheduled:  . aspirin EC  81 mg Oral Daily  . atorvastatin  80 mg Oral Daily  . clopidogrel  75 mg Oral Daily  . insulin aspart  0-9 Units Subcutaneous Q6H  . levETIRAcetam  500 mg  Intravenous Q M,W,F-2000  . [START ON 04/15/2015] levETIRAcetam  750 mg Intravenous Q24H  . sodium chloride  3 mL Intravenous Q12H   Infusions:    Assessment: Pharmacy consulted for renal adjustment of medications for 70 yo female ICU patient receiving regularly scheduled dialysis on Monday/Wednesday/Friday.    Plan:  Will continue patient on Keppra  Q24hr and supplemental  to be given after dialysis on dialysis days.   Pharmacy will continue to monitor and adjust per consult.   Simpson,Michael L 04/14/2015,4:28 PM

## 2015-04-14 NOTE — Progress Notes (Signed)
Dr. Luberta Mutter called RN to order patient transfer orders for 2A. Dr. Dema Severin, MD, ordered RN to wait for transfer orders until 2 hours after dialysis complete. So patient to transfer to have 2A transfer orders 2 hours after dialysis done and patient on unit.

## 2015-04-14 NOTE — Progress Notes (Signed)
[facility] - [place] at [facility]   PATIENT NAME: [name]    MR#:  [id number]  DATE OF BIRTH:  April 17, 1945  SUBJECTIVE: Alert. And today she is hungry.  denies any chest pain.   CHIEF COMPLAINT:   Chief Complaint  Patient presents with  . Altered Mental Status  alert at this time. But keeps repeating same words at time. REVIEW OF SYSTEMS:  Review of Systems  Unable to perform ROS: mental acuity   DRUG ALLERGIES:   Allergies  Allergen Reactions  . Contrast Media [Iodinated Diagnostic Agents] Anaphylaxis   VITALS:  Blood pressure 104/51, pulse 64, temperature 97.6 F (36.4 C), temperature source Oral, resp. rate 11, height  (1.626 m), weight 58.6 kg (129 lb 3 oz), SpO2 100 %. PHYSICAL EXAMINATION:  Physical Exam  Constitutional: She is well-developed, well-nourished, and in no distress.  HENT:  Head: Normocephalic and atraumatic.  Eyes: Conjunctivae and EOM are normal. Pupils are equal, round, and reactive to light.  Neck: Normal range of motion. Neck supple. No tracheal deviation present. No thyromegaly present.  Cardiovascular: Normal rate and regular rhythm.   Murmur heard.  Systolic murmur is present  Ejection systolic murmur present.  Pulmonary/Chest: Effort normal and breath sounds normal. No respiratory distress. She has no wheezes. She exhibits no tenderness.  Abdominal: Soft. Bowel sounds are normal. She exhibits no distension. There is no tenderness.  Musculoskeletal: Normal range of motion.  Neurological: She is alert. No cranial nerve deficit.  Not quite oriented, keep saying "thank you"  Skin: Skin is warm and dry. No rash noted.  Psychiatric: Mood and affect normal.   LABORATORY PANEL:   CBC  Recent Labs Lab 04/14/15 0914  WBC 6.7  HGB 10.5*  HCT 32.4*  PLT 157   ------------------------------------------------------------------------------------------------------------------ Chemistries   Recent Labs Lab  04/13/15 1929 04/14/15 0914  NA 138 137  K 3.5 3.7  CL 92* 91*  CO2 32 30  GLUCOSE 140* 103*  BUN 38* 43*  CREATININE 4.22* 4.79*  CALCIUM 9.0 9.0  AST 31  --   ALT 24  --   ALKPHOS 81  --   BILITOT 0.7  --    RADIOLOGY:  No results found. ASSESSMENT AND PLAN:  * Acute encephalopathy - unclear etiology at this time. Possibly due to heart failure, possibly some infection, though this seems less likely with a normal white blood cell count and normal chest x-ray. Possibly due to seizure activity. Encephalopathy  resolved. It looks like she is back to baseline..Start renal diet. * Seizures (HCC) - loaded with Keppra in the ED, continue Keppra IV afterwards.  Appreciate neurology following, eeg today.  * Acute on chronic systolic CHF (congestive heart failure) (HCC) -  Has severe AS - not an ideal candidate for surgery, had troponin elevation up to 7 last night so started on heparin drip. Discussed with Dr. Dossie Arbour,, patient had the bleeding from fistula, discussed cardiology we will discontinue heparin, continue aspirin alone.  -- Without aortic valve surgery,patient's symptoms will continue to progress, worsening cardiac failure, shortness of breath, troponin elevations, near-syncopal syncope, difficulty with hemodialysis .  - will get palliative care c/s  * Elevated troponin - likely secondary to ischemia in the setting of coronary artery disease, ischemic heart myopathy, exacerbated by severe aortic valve stenosis.  * End stage renal disease on dialysis [facility]) - nephrology consult for hemodialysis support,  * Type 2 diabetes mellitus with other circulatory complications (HCC) - sliding  scale insulin and corresponding glucose checks   * CAD (coronary artery disease) - continue home meds, cardiology consult another cardiac workup as above  * Hyperlipidemia - continue home dose statin   prognosis Is poor secondary to severe aortic stenosis, dementia, ESRD on hemodialysis. We  will consult palliative care and  Will discuss  With family ,may be appropriate for hospice   All the records are reviewed and case discussed with Care Management/Social Worker. Management plans discussed with the patient, family and they are in agreement.  CODE STATUS: FULL CODE  TOTAL TIME TAKING CARE OF THIS PATIENT: 35 minutes.     POSSIBLE D/C IN 2-3 DAYS, DEPENDING ON CLINICAL CONDITION.   Katha Hamming M.D on 04/14/2015 at 10:28 AM  Between 7am to 6pm - Pager - [phone]  After 6pm go to [email or web address] - password [email or web address] [facility]  [place]  [facility]  Office  [phone]  CC: Primary care physician; Amy L Krebs, NP  Note: This dictation was prepared with Dragon dictation along with smaller phrase technology. Any transcriptional errors that result from this process are unintentional.

## 2015-04-14 NOTE — Procedures (Signed)
ELECTROENCEPHALOGRAM REPORT   Patient: [name]       Room #: IC13A-AA EEG No. ID: [id number] Age: 70 y.o.        Sex: female Referring Physician: Luberta Mutter Report Date:  04/14/2015        Interpreting Physician: Thana Farr  History: [name] is an 70 y.o. female with history of dementia and seizures now presenting with increased breakthrough seizures.  History of left craniotomy.    Medications:  Scheduled: . aspirin EC  81 mg Oral Daily  . atorvastatin  80 mg Oral Daily  . clopidogrel  75 mg Oral Daily  . insulin aspart  0-9 Units Subcutaneous Q6H  . levETIRAcetam  500 mg Intravenous Q M,W,F-2000  . [START ON 04/15/2015] levETIRAcetam  750 mg Intravenous Q24H  . sodium chloride  3 mL Intravenous Q12H    Conditions of Recording:  This is a 16 channel EEG carried out with the patient in the awake and drowsy states.  Description:  The waking background activity consists of a low voltage, fairly well organized, 6-7 Hz theta activity, seen from the parieto-occipital and posterior temporal regions.  Low voltage fast activity, poorly organized, is seen anteriorly and is at times superimposed on more posterior regions.  A mixture of theta and alpha rhythms are seen from the central and temporal regions.  This activity is slowed over the left temporal region due to an underlying polymorphic delta activity often also slowing the posterior background rhythm in this region to the delta range.   The patient drowses with further slowing noted, still more prominent in the left temporal region.   Stage II sleep is not obtained. No epileptiform activity is noted.   Hyperventilation was not performed.  Intermittent photic stimulation was performed but failed to illicit any change in the tracing.    IMPRESSION: This is an abnormal EEG secondary to posterior background slowing.  This finding may be seen with a diffuse gray matter disturbance that is etiologically nonspecific, but may include  a dementia, among other possibilities.  Also noted is focal slowing in the mid-temporal region on the left.  This finding is consistent with the patient's history of a craniotomy.      Thana Farr, MD Neurology [phone] 04/14/2015, 2:38 PM

## 2015-04-14 NOTE — Progress Notes (Signed)
Hemodialysis completed. 

## 2015-04-14 NOTE — Consult Note (Addendum)
Reason for Consult:Seizures Referring Physician: Luberta Mutter  CC: Seizures  HPI: [name] is an 70 y.o. female with a history of seizures on Keppra who presented due to decreased responsiveness and seizure activity after dialysis.  Received IV Keppra.  Has been slow to return to baseline.  Has a history of dementia with unclear baseline.  Family not present for conversation concerning baseline at this time.     Past Medical History  Diagnosis Date  . Stroke (HCC)   . MI (myocardial infarction) (HCC)   . Hearing impaired   . Hyperlipidemia   . Hypertension   . Diabetes mellitus without complication (HCC)   . Depression   . Coronary artery disease   . Dementia   . Secondary hyperparathyroidism (HCC)   . Dialysis patient [facility])     Monday, Wednesday and Friday.   . Chronic systolic CHF (congestive heart failure) (HCC)   . GERD (gastroesophageal reflux disease)   . Personal history of transient ischemic attack (TIA) and cerebral infarction without residual deficit   . Anemia   . Moderate aortic stenosis     a. echo 04/2014: 40-50%, moderate AS  . ESRD (end stage renal disease) [facility])     Past Surgical History  Procedure Laterality Date  . Cataract extraction, bilateral    . Wrist surgery    . Peripheral vascular catheterization Left 07/28/2014    Procedure: A/V Shuntogram/Fistulagram;  Surgeon: Renford Dills, MD;  Location: [facility] INVASIVE CV LAB;  Service: Cardiovascular;  Laterality: Left;  . Peripheral vascular catheterization Left 07/28/2014    Procedure: A/V Shunt Intervention;  Surgeon: Renford Dills, MD;  Location: [facility] INVASIVE CV LAB;  Service: Cardiovascular;  Laterality: Left;  . Peripheral vascular catheterization Left 02/04/2015    Procedure: A/V Shuntogram/Fistulagram;  Surgeon: Annice Needy, MD;  Location: [facility] INVASIVE CV LAB;  Service: Cardiovascular;  Laterality: Left;  . Peripheral vascular catheterization Left 02/04/2015    Procedure: A/V Shunt  Intervention;  Surgeon: Annice Needy, MD;  Location: [facility] INVASIVE CV LAB;  Service: Cardiovascular;  Laterality: Left;    Family History  Problem Relation Age of Onset  . CAD Father     Social History:  reports that she has never smoked. She does not have any smokeless tobacco history on file. She reports that she does not drink alcohol or use illicit drugs.  Allergies  Allergen Reactions  . Contrast Media [Iodinated Diagnostic Agents] Anaphylaxis    Medications:  I have reviewed the patient's current medications. Prior to Admission:  Prescriptions prior to admission  Medication Sig Dispense Refill Last Dose  . aspirin EC 81 MG tablet Take 81 mg by mouth daily.   02/03/2015 at Unknown time  . atorvastatin (LIPITOR) 80 MG tablet Take 1 tablet (80 mg total) by mouth daily. 90 tablet 3 12/20/2014 at Unknown time  . bisacodyl (DULCOLAX) 10 MG suppository Place 1 suppository (10 mg total) rectally daily as needed for moderate constipation. 12 suppository 0   . calcium acetate (PHOSLO) 667 MG capsule Take 1,334 mg by mouth 3 (three) times daily with meals.    02/03/2015 at Unknown time  . cefTRIAXone (ROCEPHIN) 1 G SOLR injection Inject 1 g into the vein 2 (two) times daily. 14 each 0   . clopidogrel (PLAVIX) 75 MG tablet Take 1 tablet (75 mg total) by mouth daily. 30 tablet 3 02/03/2015 at Unknown time  . COD LIVER OIL PO Take 530 mg by mouth daily.   02/03/2015 at Unknown  time  . docusate sodium (COLACE) 100 MG capsule Take 100 mg by mouth at bedtime.    02/03/2015 at Unknown time  . hydrALAZINE (APRESOLINE) 50 MG tablet Take 1 tablet (50 mg total) by mouth every 8 (eight) hours. 90 tablet 0   . Influenza vac split quadrivalent PF (FLUARIX) 0.5 ML injection Inject 0.5 mLs into the muscle tomorrow at 10 am. 0.5 mL 0   . insulin aspart (NOVOLOG) 100 UNIT/ML injection Inject 3-13 Units into the skin 3 (three) times daily with meals. Sliding scale     . isosorbide dinitrate (ISORDIL) 10 MG  tablet Take 1 tablet (10 mg total) by mouth 2 (two) times daily. 30 tablet 0   . lactobacillus acidophilus (BACID) TABS tablet Take 1 tablet by mouth at bedtime.   02/03/2015 at Unknown time  . levETIRAcetam (KEPPRA) 100 MG/ML solution Take 5 mLs (500 mg total) by mouth daily. 473 mL 12   . levETIRAcetam (KEPPRA) 100 MG/ML solution Take 5 mLs (500 mg total) by mouth every Monday, Wednesday, and Friday at 6 PM. 473 mL 12   . lidocaine-prilocaine (EMLA) cream Apply 1 application topically as needed (topical anesthesia for hemodialysis if Gebauers and Lidocaine injection are ineffective.). 30 g 0   . lisinopril (PRINIVIL,ZESTRIL) 20 MG tablet Take 1 tablet (20 mg total) by mouth daily. 90 tablet 3   . lisinopril (PRINIVIL,ZESTRIL) 5 MG tablet Take 5 mg by mouth daily.     [name] LORazepam (ATIVAN) 2 MG/ML concentrated solution Take 2 mg by mouth as needed for anxiety.   prn at prn  . metoprolol tartrate (LOPRESSOR) 25 MG tablet Take 0.5 tablets (12.5 mg total) by mouth 2 (two) times daily. 30 tablet 0   . Nutritional Supplements (FEEDING SUPPLEMENT, NEPRO CARB STEADY,) LIQD Take 237 mLs by mouth 2 (two) times daily between meals. 60 Can 0   . Omega-3 Fatty Acids (FISH OIL) 500 MG CAPS Take 500 mg by mouth 2 (two) times daily.   02/03/2015 at Unknown time  . pneumococcal 23 valent vaccine (PNU-IMMUNE) 25 MCG/0.5ML injection Inject 0.5 mLs into the muscle tomorrow at 10 am. 2.5 mL 0   . promethazine (PHENERGAN) 25 MG tablet Take 25 mg by mouth every 6 (six) hours as needed for nausea or vomiting.   12/01/2014 at Unknown time  . ranitidine (ZANTAC) 150 MG tablet Take 1 tablet (150 mg total) by mouth at bedtime. 90 tablet 3   . vitamin C (ASCORBIC ACID) 500 MG tablet Take 500 mg by mouth 2 (two) times daily.    12/20/2014 at Unknown time  . Vitamin D, Ergocalciferol, (DRISDOL) 50000 UNITS CAPS capsule Take 50,000 Units by mouth every 30 (thirty) days. Pt takes on the 7th of every month.   Past Month at Unknown time    Scheduled: . aspirin EC  81 mg Oral Daily  . atorvastatin  80 mg Oral Daily  . clopidogrel  75 mg Oral Daily  . insulin aspart  0-9 Units Subcutaneous Q6H  . levETIRAcetam  500 mg Intravenous Q24H  . levETIRAcetam  500 mg Intravenous Q M,W,F-2000  . sodium chloride  3 mL Intravenous Q12H    ROS: Unable to obtain due to mental  status  Physical Examination: Blood pressure 104/51, pulse 64, temperature 97.6 F (36.4 C), temperature source Oral, resp. rate 11, height 5\' 4"  (1.626 m), weight 58.6 kg (129 lb 3 oz), SpO2 100 %.  HEENT-  Normocephalic, no lesions, without obvious abnormality.  Normal external eye  and conjunctiva.  Normal TM's bilaterally.  Normal auditory canals and external ears. Normal external nose, mucus membranes and septum.  Normal pharynx. Cardiovascular- S1, S2 normal, pulses palpable throughout   Lungs- chest clear, no wheezing, rales, normal symmetric air entry Abdomen- soft, non-tender; bowel sounds normal; no masses,  no organomegaly Extremities- mild edema Lymph-no adenopathy palpable Musculoskeletal-no joint tenderness, deformity or swelling Skin-dressing on feet bilaterally  Neurological Examination Mental Status: Alert but at time will turn her head to the right and close her eyes for no apparent reason.  Oriented to name only.  Nods head yes and no to questioning and gives her first and last name but tends to give on of these answers for all questions requiring a verbal response.  Lifts each hand to command but unable to stick out tongue or differentiate number of fingers or evne left or right hand. Cranial Nerves: II: Discs flat bilaterally; Blinks to bilateral confrontation.  Pupils equal, round, reactive to light and accommodation III,IV, VI: ptosis not present, extra-ocular motions intact bilaterally V,VII: grimaces symmetrically VIII: hearing normal bilaterally IX,X: gag reflex present XI: unable to test XII: unable to test Motor: Moves both  upper extremities against gravity.  Does not lift her legs off the bed.  Sensory: Responds to noxious stimuli in all extremities Deep Tendon Reflexes: 2+ in the upper extremities and absent in the lower extremities Plantars: Right: upgoing   Left: upgoing Cerebellar: Unable to perform Gait: not tested due to safety concerns  Laboratory Studies:   Basic Metabolic Panel:  Recent Labs Lab 04/13/15 0128 04/13/15 0748 04/13/15 1929 04/14/15 0914  NA 134* 136 138 137  K 3.9 3.8 3.5 3.7  CL 91* 92* 92* 91*  CO2 25 27 32 30  GLUCOSE 271* 244* 140* 103*  BUN 30* 34* 38* 43*  CREATININE 3.43* 3.92* 4.22* 4.79*  CALCIUM 8.3* 8.8* 9.0 9.0  PHOS  --   --   --  4.0    Liver Function Tests:  Recent Labs Lab 04/13/15 0128 04/13/15 1929 04/14/15 0914  AST 28 31  --   ALT 19 24  --   ALKPHOS 93 81  --   BILITOT 0.8 0.7  --   PROT 6.4* 6.4*  --   ALBUMIN 3.3* 3.4* 3.0*   No results for input(s): LIPASE, AMYLASE in the last 168 hours. No results for input(s): AMMONIA in the last 168 hours.  CBC:  Recent Labs Lab 04/13/15 0128 04/13/15 0748 04/14/15 0914  WBC 6.4 8.5 6.7  NEUTROABS 5.1  --   --   HGB 10.6* 11.3* 10.5*  HCT 32.6* 34.5* 32.4*  MCV 95.1 96.0 94.4  PLT 125* 123* 157    Cardiac Enzymes:  Recent Labs Lab 04/13/15 0128 04/13/15 0748 04/13/15 1248 04/13/15 1929  TROPONINI 0.12* 1.05* 3.44* 7.70*    BNP: Invalid input(s): POCBNP  CBG:  Recent Labs Lab 04/13/15 1137 04/13/15 1608 04/13/15 1814 04/13/15 2321 04/14/15 0616  GLUCAP 200* 170* 167* 113* 107*    Microbiology: Results for orders placed or performed during the hospital encounter of 04/13/15  Culture, blood (routine x 2)     Status: None (Preliminary result)   Collection Time: 04/13/15  2:26 AM  Result Value Ref Range Status   Specimen Description BLOOD BLOOD RIGHT FOREARM  Final   Special Requests BOTTLES DRAWN AEROBIC AND ANAEROBIC  Final   Culture NO GROWTH < 12 HOURS   Final   Report Status PENDING  Incomplete  Culture,  blood (routine x 2)     Status: None (Preliminary result)   Collection Time: 04/13/15  2:50 AM  Result Value Ref Range Status   Specimen Description BLOOD RIGHT HAND  Final   Special Requests BOTTLES DRAWN AEROBIC AND ANAEROBIC  Final   Culture NO GROWTH < 12 HOURS  Final   Report Status PENDING  Incomplete  MRSA PCR Screening     Status: None   Collection Time: 04/13/15  6:18 AM  Result Value Ref Range Status   MRSA by PCR NEGATIVE NEGATIVE Final    Comment:        The GeneXpert MRSA Assay (FDA approved for NASAL specimens only), is one component of a comprehensive MRSA colonization surveillance program. It is not intended to diagnose MRSA infection nor to guide or monitor treatment for MRSA infections.     Coagulation Studies:  Recent Labs  04/13/15 2322  LABPROT 15.7*  INR 1.23    Urinalysis: No results for input(s): COLORURINE, LABSPEC, PHURINE, GLUCOSEU, HGBUR, BILIRUBINUR, KETONESUR, PROTEINUR, UROBILINOGEN, NITRITE, LEUKOCYTESUR in the last 168 hours.  Invalid input(s): APPERANCEUR  Lipid Panel:     Component Value Date/Time   CHOL 256* 04/22/2014 0220   TRIG 32 02/07/2015 0211   TRIG 22 04/22/2014 0220   HDL 73* 04/22/2014 0220   VLDL 4* 04/22/2014 0220   LDLCALC 179* 04/22/2014 0220    HgbA1C:  Lab Results  Component Value Date   HGBA1C 5.1 04/22/2014    Urine Drug Screen:  No results found for: LABOPIA, COCAINSCRNUR, LABBENZ, AMPHETMU, THCU, LABBARB  Alcohol Level: No results for input(s): ETH in the last 168 hours.  Other results: EKG: sinus rhythm at 81 bpm.  Imaging: Ct Head Wo Contrast  04/13/2015  CLINICAL DATA:  Decreased level of consciousness. Unresponsive. Patient was laying on couch and woke family with loud snoring. EXAM: CT HEAD WITHOUT CONTRAST TECHNIQUE: Contiguous axial images were obtained from the base of the skull through the vertex without intravenous contrast.  COMPARISON:  02/28/2015 FINDINGS: Diffuse cerebral atrophy. Ventricular dilatation consistent with central atrophy. Low-attenuation changes throughout the deep white matter consistent with small vessel ischemia. Old lacunar infarcts in the basal ganglia and thalamus bilaterally. Focal area of old encephalomalacia in the left posterior parietal and occipital lobes consistent with old infarcts. No significant change since prior study. No abnormal extra-axial fluid collections. Gray-white matter junctions are distinct. Basal cisterns are not effaced. No acute intracranial hemorrhage. Calvarium appears intact. Visualized paranasal sinuses and mastoid air cells are not opacified. Vascular calcifications. IMPRESSION: No acute intracranial abnormalities. Chronic atrophy and small vessel ischemic changes. 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 Electronically Signed   By: Burman Nieves M.D.   On: 04/13/2015 01:44   Dg Chest Port 1 View  04/13/2015  CLINICAL DATA:  Patient is unresponsive. History of stroke, MI, hypertension, diabetes, coronary artery disease, dementia, dialysis, CHF, GERD, ESRD, PVC. EXAM: PORTABLE CHEST 1 VIEW COMPARISON:  02/28/2015 FINDINGS: Cardiac enlargement without vascular congestion. No focal airspace disease or consolidation in the lungs. Focal scarring in the mid lung on the left. No blunting of costophrenic angles. No pneumothorax. Calcification of aorta. No change since prior study. IMPRESSION: Cardiac enlargement.  No evidence of active pulmonary disease. Electronically Signed   By: Burman Nieves M.D.   On: 04/13/2015 01:51     Assessment/Plan: 70 year old female with a history of dementia, ESRD and seizures who presented with decreased responsiveness and seizures.  Unclear baseline  but patient with heel sores bilaterally that suggest the patient has not been significantly ambulatory.  Mental status improving.  Responsive today.  No further seizures  noted but does close her eyes for no reason at times and has presented with seizures, therefore further work up recommended.    Recommendations: 1.  EEG 2.  Increase Keppra to  from   Thana Farr, MD Neurology [phone] 04/14/2015, 11:22 AM

## 2015-04-14 NOTE — Care Management (Signed)
CM has not been able to assess as patient was off the unit when CM made attempt.  Patient is an established  esrd hemodialysis patient.  There is a palliative care consult in process for goals of treatment.  patient is a full code.  At last discharge in 01/2015- patient discharged to skilled nursing.  She presented from home

## 2015-04-14 NOTE — Progress Notes (Signed)
[facility]  ROUNDING NOTE   Subjective:   Seen and examined on hemodialysis. Tolerating treatment well.   Objective:  Vital signs in last 24 hours:  Temp:  [97.6 F (36.4 C)-98 F (36.7 C)] 97.7 F (36.5 C) (01/25 1140) Pulse Rate:  [58-80] 65 (01/25 1448) Resp:  [0-26] 21 (01/25 1448) BP: (89-133)/(51-86) 119/61 mmHg (01/25 1448) SpO2:  [97 %-100 %] 100 % (01/25 1448) Weight:  [58.6 kg (129 lb 3 oz)] 58.6 kg (129 lb 3 oz) (01/25 0500)  Weight change: -0.368 kg (-13 oz) Filed Weights   04/13/15 0117 04/13/15 0600 04/14/15 0500  Weight: 58.968 kg (130 lb) 57.7 kg (127 lb 3.3 oz) 58.6 kg (129 lb 3 oz)    Intake/Output: I/O last 3 completed shifts: In: 54.3 [I.V.:54.3] Out: -    Intake/Output this shift:  Total I/O In: 26.8 [I.V.:26.8] Out: -   Physical Exam: General: NAD, laying in bed  Head: Normocephalic, atraumatic. Moist oral mucosal membranes  Eyes: Anicteric, PERRL  Neck: Supple, trachea midline  Lungs:  Clear to auscultation  Heart: Regular rate and rhythm  Abdomen:  Soft, nontender,   Extremities: no peripheral edema.  Neurologic: Nonfocal, moving all four extremities  Skin: No lesions  Access: Left AVG    Basic Metabolic Panel:  Recent Labs Lab 04/13/15 0128 04/13/15 0748 04/13/15 1929 04/14/15 0914  NA 134* 136 138 137  K 3.9 3.8 3.5 3.7  CL 91* 92* 92* 91*  CO2 25 27 32 30  GLUCOSE 271* 244* 140* 103*  BUN 30* 34* 38* 43*  CREATININE 3.43* 3.92* 4.22* 4.79*  CALCIUM 8.3* 8.8* 9.0 9.0  PHOS  --   --   --  4.0    Liver Function Tests:  Recent Labs Lab 04/13/15 0128 04/13/15 1929 04/14/15 0914  AST 28 31  --   ALT 19 24  --   ALKPHOS 93 81  --   BILITOT 0.8 0.7  --   PROT 6.4* 6.4*  --   ALBUMIN 3.3* 3.4* 3.0*   No results for input(s): LIPASE, AMYLASE in the last 168 hours. No results for input(s): AMMONIA in the last 168 hours.  CBC:  Recent Labs Lab 04/13/15 0128 04/13/15 0748 04/14/15 0914  WBC 6.4  8.5 6.7  NEUTROABS 5.1  --   --   HGB 10.6* 11.3* 10.5*  HCT 32.6* 34.5* 32.4*  MCV 95.1 96.0 94.4  PLT 125* 123* 157    Cardiac Enzymes:  Recent Labs Lab 04/13/15 0128 04/13/15 0748 04/13/15 1248 04/13/15 1929  TROPONINI 0.12* 1.05* 3.44* 7.70*    BNP: Invalid input(s): POCBNP  CBG:  Recent Labs Lab 04/13/15 1137 04/13/15 1608 04/13/15 1814 04/13/15 2321 04/14/15 0616  GLUCAP 200* 170* 167* 113* 107*    Microbiology: Results for orders placed or performed during the hospital encounter of 04/13/15  Culture, blood (routine x 2)     Status: None (Preliminary result)   Collection Time: 04/13/15  2:26 AM  Result Value Ref Range Status   Specimen Description BLOOD BLOOD RIGHT FOREARM  Final   Special Requests BOTTLES DRAWN AEROBIC AND ANAEROBIC  Final   Culture NO GROWTH 1 DAY  Final   Report Status PENDING  Incomplete  Culture, blood (routine x 2)     Status: None (Preliminary result)   Collection Time: 04/13/15  2:50 AM  Result Value Ref Range Status   Specimen Description BLOOD RIGHT HAND  Final   Special Requests BOTTLES DRAWN AEROBIC AND  ANAEROBIC  Final   Culture NO GROWTH 1 DAY  Final   Report Status PENDING  Incomplete  MRSA PCR Screening     Status: None   Collection Time: 04/13/15  6:18 AM  Result Value Ref Range Status   MRSA by PCR NEGATIVE NEGATIVE Final    Comment:        The GeneXpert MRSA Assay (FDA approved for NASAL specimens only), is one component of a comprehensive MRSA colonization surveillance program. It is not intended to diagnose MRSA infection nor to guide or monitor treatment for MRSA infections.     Coagulation Studies:  Recent Labs  04/13/15 2322  LABPROT 15.7*  INR 1.23    Urinalysis: No results for input(s): COLORURINE, LABSPEC, PHURINE, GLUCOSEU, HGBUR, BILIRUBINUR, KETONESUR, PROTEINUR, UROBILINOGEN, NITRITE, LEUKOCYTESUR in the last 72 hours.  Invalid input(s): APPERANCEUR    Imaging: Ct Head Wo  Contrast  04/13/2015  CLINICAL DATA:  Decreased level of consciousness. Unresponsive. Patient was laying on couch and woke family with loud snoring. EXAM: CT HEAD WITHOUT CONTRAST TECHNIQUE: Contiguous axial images were obtained from the base of the skull through the vertex without intravenous contrast. COMPARISON:  02/28/2015 FINDINGS: Diffuse cerebral atrophy. Ventricular dilatation consistent with central atrophy. Low-attenuation changes throughout the deep white matter consistent with small vessel ischemia. Old lacunar infarcts in the basal ganglia and thalamus bilaterally. Focal area of old encephalomalacia in the left posterior parietal and occipital lobes consistent with old infarcts. No significant change since prior study. No abnormal extra-axial fluid collections. Gray-white matter junctions are distinct. Basal cisterns are not effaced. No acute intracranial hemorrhage. Calvarium appears intact. Visualized paranasal sinuses and mastoid air cells are not opacified. Vascular calcifications. IMPRESSION: No acute intracranial abnormalities. Chronic atrophy and small vessel ischemic changes. 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 Electronically Signed   By: Burman Nieves M.D.   On: 04/13/2015 01:44   Dg Chest Port 1 View  04/13/2015  CLINICAL DATA:  Patient is unresponsive. History of stroke, MI, hypertension, diabetes, coronary artery disease, dementia, dialysis, CHF, GERD, ESRD, PVC. EXAM: PORTABLE CHEST 1 VIEW COMPARISON:  02/28/2015 FINDINGS: Cardiac enlargement without vascular congestion. No focal airspace disease or consolidation in the lungs. Focal scarring in the mid lung on the left. No blunting of costophrenic angles. No pneumothorax. Calcification of aorta. No change since prior study. IMPRESSION: Cardiac enlargement.  No evidence of active pulmonary disease. Electronically Signed   By: Burman Nieves M.D.   On: 04/13/2015 01:51     Medications:     .  aspirin EC  81 mg Oral Daily  . atorvastatin  80 mg Oral Daily  . clopidogrel  75 mg Oral Daily  . insulin aspart  0-9 Units Subcutaneous Q6H  . levETIRAcetam  500 mg Intravenous Q M,W,F-2000  . [START ON 04/15/2015] levETIRAcetam  750 mg Intravenous Q24H  . sodium chloride  3 mL Intravenous Q12H   ondansetron **OR** ondansetron (ZOFRAN) IV  Assessment/ Plan:  [name] is a 70 y.o. black female with end-stage renal disease, myocardial infarction, CVA, hyperlipidemia, hypertension, diabetes mellitus, depression, secondary hyperparathyroidism, dementia, GERD, anemia chronic kidney disease   [facility] MWF [facility] [address]   1. End-stage renal disease on hemodialysis. Tolerating hemodialysis well - AVG  - now on a heparin gtt. Monitor for post treatment bleeding  2. Anemia chronic kidney disease.  - Continue to hold Epogen.   3. Secondary hyperparathyroidism. - holding calcium acetate   4. Hypertension:  blood pressure at goal.  - holding metoprolol, isosorbide dinitrate, hydralazine  5. Diabetes mellitus type II with chronic kidney disease  - Continue glucose control.    LOS: 1 [name] 1/25/20173:10 PM

## 2015-04-14 NOTE — Progress Notes (Signed)
ANTICOAGULATION CONSULT NOTE - Initial Consult  Pharmacy Consult for Heparin Indication: chest pain/ACS  Allergies  Allergen Reactions  . Contrast Media [Iodinated Diagnostic Agents] Anaphylaxis    Patient Measurements: Height:  (162.6 cm) Weight: 129 lb 3 oz (58.6 kg) IBW/kg (Calculated) : 54.7   Vital Signs: Temp: 97.9 F (36.6 C) (01/25 0000) Temp Source: Axillary (01/25 0000) BP: 97/55 mmHg (01/25 0600) Pulse Rate: 65 (01/25 0600)  Labs:  Recent Labs  04/13/15 0128 04/13/15 0748 04/13/15 1248 04/13/15 1929 04/13/15 2322 04/14/15 0424  HGB 10.6* 11.3*  --   --   --   --   HCT 32.6* 34.5*  --   --   --   --   PLT 125* 123*  --   --   --   --   APTT  --   --   --   --  39*  --   LABPROT  --   --   --   --  15.7*  --   INR  --   --   --   --  1.23  --   HEPARINUNFRC  --   --   --   --   --  0.76*  CREATININE 3.43* 3.92*  --  4.22*  --   --   TROPONINI 0.12* 1.05* 3.44* 7.70*  --   --     Estimated Creatinine Clearance: 10.9 mL/min (by C-G formula based on Cr of 4.22).   Medical History: Past Medical History  Diagnosis Date  . Stroke (HCC)   . MI (myocardial infarction) (HCC)   . Hearing impaired   . Hyperlipidemia   . Hypertension   . Diabetes mellitus without complication (HCC)   . Depression   . Coronary artery disease   . Dementia   . Secondary hyperparathyroidism (HCC)   . Dialysis patient [facility])     Monday, Wednesday and Friday.   . Chronic systolic CHF (congestive heart failure) (HCC)   . GERD (gastroesophageal reflux disease)   . Personal history of transient ischemic attack (TIA) and cerebral infarction without residual deficit   . Anemia   . Moderate aortic stenosis     a. echo 04/2014: 40-50%, moderate AS  . ESRD (end stage renal disease) (HCC)     Medications:  Scheduled:  . aspirin EC  81 mg Oral Daily  . atorvastatin  80 mg Oral Daily  . clopidogrel  75 mg Oral Daily  . insulin aspart  0-9 Units Subcutaneous Q6H  .  levETIRAcetam  500 mg Intravenous Q24H  . levETIRAcetam  500 mg Intravenous Q M,W,F-2000  . sodium chloride  3 mL Intravenous Q12H   Infusions:  . heparin 12 Units/kg/hr (04/13/15 2340)    Assessment: 70 y/o F with elevated troponin, continuing to rise. Patient received last dose of [place] heparin at 1449.   Goal of Therapy:  Heparin level 0.3-0.7 units/ml Monitor platelets by anticoagulation protocol: Yes   Plan:  Give 3500 units bolus x 1 Start heparin infusion at 700 units/hr Check anti-Xa level in 8 hours and daily while on heparin Continue to monitor H&H and platelets   1/25 AM heparin level 0.76. Reduce to 650 units/hr and recheck in 8 hours.  [name] 04/14/2015,7:00 AM

## 2015-04-15 ENCOUNTER — Telehealth: Payer: Self-pay | Admitting: Cardiovascular Disease

## 2015-04-15 ENCOUNTER — Ambulatory Visit: Payer: Medicare Other | Admitting: Cardiovascular Disease

## 2015-04-15 DIAGNOSIS — E440 Moderate protein-calorie malnutrition: Secondary | ICD-10-CM | POA: Insufficient documentation

## 2015-04-15 LAB — HEPATITIS B SURFACE ANTIBODY,QUALITATIVE: HEP B S AB: REACTIVE

## 2015-04-15 LAB — HEPATITIS B CORE ANTIBODY, TOTAL: Hep B Core Total Ab: NEGATIVE

## 2015-04-15 LAB — GLUCOSE, CAPILLARY
GLUCOSE-CAPILLARY: 342 mg/dL — AB (ref 65–99)
GLUCOSE-CAPILLARY: 217 mg/dL — AB (ref 65–99)
GLUCOSE-CAPILLARY: 114 mg/dL — AB (ref 65–99)
Glucose-Capillary: 175 mg/dL — ABNORMAL HIGH (ref 65–99)

## 2015-04-15 LAB — HEPATITIS B SURFACE ANTIGEN: HEP B S AG: NEGATIVE

## 2015-04-15 MED ORDER — NEPRO/CARBSTEADY PO LIQD
237.0000 mL | Freq: Two times a day (BID) | ORAL | Status: DC
  Administered 2015-04-15 – 2015-04-21 (×11): 237 mL via ORAL

## 2015-04-15 NOTE — Progress Notes (Signed)
[facility]  ROUNDING NOTE   Subjective:   Hemodialysis yesterday. Tolerated treatment well. UF of 1 litre  Pleasantly demented.   Objective:  Vital signs in last 24 hours:  Temp:  [97.5 F (36.4 C)-98.2 F (36.8 C)] 98 F (36.7 C) (01/26 0000) Pulse Rate:  [58-88] 81 (01/26 0600) Resp:  [10-35] 13 (01/26 0600) BP: (83-126)/(42-78) 110/65 mmHg (01/26 0600) SpO2:  [90 %-100 %] 95 % (01/26 0600) Weight:  [56.5 kg (124 lb 9 oz)-58.8 kg (129 lb 10.1 oz)] 58.8 kg (129 lb 10.1 oz) (01/26 0500)  Weight change: -1.1 kg (-2 lb 6.8 oz) Filed Weights   04/14/15 1130 04/14/15 1448 04/15/15 0500  Weight: 57.5 kg (126 lb 12.2 oz) 56.5 kg (124 lb 9 oz) 58.8 kg (129 lb 10.1 oz)    Intake/Output: I/O last 3 completed shifts: In: 572.7 [P.O.:360; I.V.:107.7; IV Piggyback:105] Out: 1000 [Other:1000]   Intake/Output this shift:     Physical Exam: General: NAD, laying in bed  Head: Normocephalic, atraumatic. Moist oral mucosal membranes  Eyes: Anicteric, PERRL  Neck: Supple, trachea midline  Lungs:  Clear to auscultation  Heart: Regular rate and rhythm  Abdomen:  Soft, nontender,   Extremities: no peripheral edema.  Neurologic: Nonfocal, moving all four extremities  Skin: No lesions  Access: Left AVG    Basic Metabolic Panel:  Recent Labs Lab 04/13/15 0128 04/13/15 0748 04/13/15 1929 04/14/15 0914  NA 134* 136 138 137  K 3.9 3.8 3.5 3.7  CL 91* 92* 92* 91*  CO2 25 27 32 30  GLUCOSE 271* 244* 140* 103*  BUN 30* 34* 38* 43*  CREATININE 3.43* 3.92* 4.22* 4.79*  CALCIUM 8.3* 8.8* 9.0 9.0  PHOS  --   --   --  4.0    Liver Function Tests:  Recent Labs Lab 04/13/15 0128 04/13/15 1929 04/14/15 0914  AST 28 31  --   ALT 19 24  --   ALKPHOS 93 81  --   BILITOT 0.8 0.7  --   PROT 6.4* 6.4*  --   ALBUMIN 3.3* 3.4* 3.0*   No results for input(s): LIPASE, AMYLASE in the last 168 hours. No results for input(s): AMMONIA in the last 168  hours.  CBC:  Recent Labs Lab 04/13/15 0128 04/13/15 0748 04/14/15 0914  WBC 6.4 8.5 6.7  NEUTROABS 5.1  --   --   HGB 10.6* 11.3* 10.5*  HCT 32.6* 34.5* 32.4*  MCV 95.1 96.0 94.4  PLT 125* 123* 157    Cardiac Enzymes:  Recent Labs Lab 04/13/15 0128 04/13/15 0748 04/13/15 1248 04/13/15 1929  TROPONINI 0.12* 1.05* 3.44* 7.70*    BNP: Invalid input(s): POCBNP  CBG:  Recent Labs Lab 04/14/15 1610 04/14/15 1618 04/14/15 1840 04/14/15 2339 04/15/15 0615  GLUCAP 67 88 151* 217* 175*    Microbiology: Results for orders placed or performed during the hospital encounter of 04/13/15  Culture, blood (routine x 2)     Status: None (Preliminary result)   Collection Time: 04/13/15  2:26 AM  Result Value Ref Range Status   Specimen Description BLOOD BLOOD RIGHT FOREARM  Final   Special Requests BOTTLES DRAWN AEROBIC AND ANAEROBIC  Final   Culture NO GROWTH 2 DAYS  Final   Report Status PENDING  Incomplete  Culture, blood (routine x 2)     Status: None (Preliminary result)   Collection Time: 04/13/15  2:50 AM  Result Value Ref Range Status   Specimen Description BLOOD RIGHT HAND  Final   Special Requests BOTTLES DRAWN AEROBIC AND ANAEROBIC  Final   Culture NO GROWTH 2 DAYS  Final   Report Status PENDING  Incomplete  MRSA PCR Screening     Status: None   Collection Time: 04/13/15  6:18 AM  Result Value Ref Range Status   MRSA by PCR NEGATIVE NEGATIVE Final    Comment:        The GeneXpert MRSA Assay (FDA approved for NASAL specimens only), is one component of a comprehensive MRSA colonization surveillance program. It is not intended to diagnose MRSA infection nor to guide or monitor treatment for MRSA infections.     Coagulation Studies:  Recent Labs  04/13/15 2322  LABPROT 15.7*  INR 1.23    Urinalysis: No results for input(s): COLORURINE, LABSPEC, PHURINE, GLUCOSEU, HGBUR, BILIRUBINUR, KETONESUR, PROTEINUR, UROBILINOGEN, NITRITE,  LEUKOCYTESUR in the last 72 hours.  Invalid input(s): APPERANCEUR    Imaging: No results found.   Medications:     . aspirin EC  81 mg Oral Daily  . atorvastatin  80 mg Oral Daily  . clopidogrel  75 mg Oral Daily  . insulin aspart  0-9 Units Subcutaneous Q6H  . levETIRAcetam  500 mg Intravenous Q M,W,F-2000  . levETIRAcetam  750 mg Intravenous Q24H  . sodium chloride  3 mL Intravenous Q12H   ondansetron **OR** ondansetron (ZOFRAN) IV  Assessment/ Plan:  [name] is a 70 y.o. black female with end-stage renal disease, myocardial infarction, CVA, hyperlipidemia, hypertension, diabetes mellitus, depression, secondary hyperparathyroidism, dementia, GERD, anemia chronic kidney disease   [facility] MWF [facility] [address]   1. End-stage renal disease on hemodialysis. Tolerated hemodialysis treatment well. Through AVG - Continue MWF schedule. Next treatment for tomorrow.   2. Anemia chronic kidney disease. 10.5 - Continue to hold Epogen due to ischemic event.  3. Secondary hyperparathyroidism: phos 4  - at goal.  - holding calcium acetate   4. Hypertension: hypotensive overnight - holding metoprolol, isosorbide dinitrate, hydralazine  5. Diabetes mellitus type II with chronic kidney disease  - Continue glucose control.    LOS: 2 [name] 1/26/20179:50 AM

## 2015-04-15 NOTE — Progress Notes (Signed)
Pt transferred to RM 243 from the unit. Pt resting comfortably with no complaints of pain or discomfort and call bell within reach.Tele verified by Edson Snowball, NT and skin verified by [employer], RN. Nursing will continue to assess.

## 2015-04-15 NOTE — Progress Notes (Signed)
PHARMACY - CRITICAL CARE PROGRESS NOTE  Pharmacy Consult for Renal adjustment    Allergies  Allergen Reactions  . Contrast Media [Iodinated Diagnostic Agents] Anaphylaxis    Patient Measurements: Height:  (162.6 cm) Weight: 129 lb 10.1 oz (58.8 kg) IBW/kg (Calculated) : 54.7   Vital Signs: Temp: 97.9 F (36.6 C) (01/26 0800) Temp Source: Oral (01/26 0800) BP: 114/66 mmHg (01/26 1000) Pulse Rate: 77 (01/26 1000) Intake/Output from previous day: 01/25 0701 - 01/26 0700 In: 521.4 [P.O.:360; I.V.:56.4; IV Piggyback:105] Out: 1000  Intake/Output from this shift: Total I/O In: 200 [P.O.:200] Out: -  Vent settings for last 24 hours:    Labs:  Recent Labs  04/13/15 0128 04/13/15 0748 04/13/15 1929 04/13/15 2322 04/14/15 0914  WBC 6.4 8.5  --   --  6.7  HGB 10.6* 11.3*  --   --  10.5*  HCT 32.6* 34.5*  --   --  32.4*  PLT 125* 123*  --   --  157  APTT  --   --   --  39*  --   INR  --   --   --  1.23  --   CREATININE 3.43* 3.92* 4.22*  --  4.79*  PHOS  --   --   --   --  4.0  ALBUMIN 3.3*  --  3.4*  --  3.0*  PROT 6.4*  --  6.4*  --   --   AST 28  --  31  --   --   ALT 19  --  24  --   --   ALKPHOS 93  --  81  --   --   BILITOT 0.8  --  0.7  --   --    Estimated Creatinine Clearance: 9.6 mL/min (by C-G formula based on Cr of 4.79).   Recent Labs  04/14/15 2339 04/15/15 0615 04/15/15 1349  GLUCAP 217* 175* 342*    Microbiology: Recent Results (from the past 720 hour(s))  Culture, blood (routine x 2)     Status: None (Preliminary result)   Collection Time: 04/13/15  2:26 AM  Result Value Ref Range Status   Specimen Description BLOOD BLOOD RIGHT FOREARM  Final   Special Requests BOTTLES DRAWN AEROBIC AND ANAEROBIC  Final   Culture NO GROWTH 2 DAYS  Final   Report Status PENDING  Incomplete  Culture, blood (routine x 2)     Status: None (Preliminary result)   Collection Time: 04/13/15  2:50 AM  Result Value Ref Range Status   Specimen  Description BLOOD RIGHT HAND  Final   Special Requests BOTTLES DRAWN AEROBIC AND ANAEROBIC  Final   Culture NO GROWTH 2 DAYS  Final   Report Status PENDING  Incomplete  MRSA PCR Screening     Status: None   Collection Time: 04/13/15  6:18 AM  Result Value Ref Range Status   MRSA by PCR NEGATIVE NEGATIVE Final    Comment:        The GeneXpert MRSA Assay (FDA approved for NASAL specimens only), is one component of a comprehensive MRSA colonization surveillance program. It is not intended to diagnose MRSA infection nor to guide or monitor treatment for MRSA infections.     Medications:  Scheduled:  . aspirin EC  81 mg Oral Daily  . atorvastatin  80 mg Oral Daily  . clopidogrel  75 mg Oral Daily  . feeding supplement (NEPRO CARB STEADY)  237 mL Oral BID  BM  . insulin aspart  0-9 Units Subcutaneous Q6H  . levETIRAcetam  500 mg Intravenous Q M,W,F-2000  . levETIRAcetam  750 mg Intravenous Q24H  . sodium chloride  3 mL Intravenous Q12H   Infusions:    Assessment: Pharmacy consulted for renal adjustment of medications for 70 yo female ICU patient receiving regularly scheduled dialysis on Monday/Wednesday/Friday.    Plan:  Will continue patient on Keppra  Q24hr and supplemental  to be given after dialysis on dialysis days.   No further adjustments warranted at this time.   Pharmacy will continue to monitor and adjust per consult.   Jakaden Ouzts L 04/15/2015,3:03 PM

## 2015-04-15 NOTE — Progress Notes (Signed)
[facility] - [place] [facility]   PATIENT NAME: Genevieve Arbaugh    MR#:  [id number]  DATE OF BIRTH:  Oct 29, 1945  SUBJECTIVE: Alert., Denies any complaints.   CHIEF COMPLAINT:   Chief Complaint  Patient presents with  . Altered Mental Status   REVIEW OF SYSTEMS:  Review of Systems  Unable to perform ROS: mental acuity   DRUG ALLERGIES:   Allergies  Allergen Reactions  . Contrast Media [Iodinated Diagnostic Agents] Anaphylaxis   VITALS:  Blood pressure 114/66, pulse 77, temperature 97.9 F (36.6 C), temperature source Oral, resp. rate 28, height  (1.626 m), weight 58.8 kg (129 lb 10.1 oz), SpO2 100 %. PHYSICAL EXAMINATION:  Physical Exam  Constitutional: She is well-developed, well-nourished, and in no distress.  HENT:  Head: Normocephalic and atraumatic.  Eyes: Conjunctivae and EOM are normal. Pupils are equal, round, and reactive to light.  Neck: Normal range of motion. Neck supple. No tracheal deviation present. No thyromegaly present.  Cardiovascular: Normal rate and regular rhythm.   Murmur heard.  Systolic murmur is present  Ejection systolic murmur present.  Pulmonary/Chest: Effort normal and breath sounds normal. No respiratory distress. She has no wheezes. She exhibits no tenderness.  Abdominal: Soft. Bowel sounds are normal. She exhibits no distension. There is no tenderness.  Musculoskeletal: Normal range of motion.  Neurological: She is alert. No cranial nerve deficit.  Not quite oriented, keep saying "thank you"  Skin: Skin is warm and dry. No rash noted.  Psychiatric: Mood and affect normal.   LABORATORY PANEL:   CBC  Recent Labs Lab 04/14/15 0914  WBC 6.7  HGB 10.5*  HCT 32.4*  PLT 157   ------------------------------------------------------------------------------------------------------------------ Chemistries   Recent Labs Lab 04/13/15 1929 04/14/15 0914  NA 138 137  K 3.5 3.7  CL 92* 91*  CO2 32 30   GLUCOSE 140* 103*  BUN 38* 43*  CREATININE 4.22* 4.79*  CALCIUM 9.0 9.0  AST 31  --   ALT 24  --   ALKPHOS 81  --   BILITOT 0.7  --    RADIOLOGY:  No results found. ASSESSMENT AND PLAN:  * Acute encephalopathy - unclear etiology at this time. Possibly due to heart failure, possibly some infection, though this seems less likely with a normal white blood cell count and normal chest x-ray. Possibly due to seizure activity. Encephalopathy  resolved. It looks like she is back to baseline..Start renal diet. * Seizures (HCC) -Keppra dose is adjusted.had EEG,results pending    * Acute on chronic systolic CHF (congestive heart failure) (HCC) -on hemodialysis as per nephrology.  Has severe AS - NSTEMI due to AS;heparin drip received.now ASA,plavix and high intensity statins.  -- Without aortic valve surgery,patient's symptoms will continue to progress, worsening cardiac failure, shortness of breath, troponin elevations, near-syncopal syncope, difficulty with hemodialysis .  - will get palliative care c/s  * Elevated troponin - likely secondary to ischemia in the setting of coronary artery disease, ischemic heart myopathy, exacerbated by severe aortic valve stenosis. * End stage renal disease on dialysis [facility]) - nephrology consult for hemodialysis support,  * Type 2 diabetes mellitus with other circulatory complications (HCC) - sliding scale insulin and corresponding glucose checks   * CAD (coronary artery disease) - continue home meds, cardiology consult another cardiac workup as above  * Hyperlipidemia - continue home dose statin Hypertension ;now hypotensive. Will hold the blood pressure medication.   prognosis Is poor secondary to severe aortic stenosis,  dementia, ESRD on hemodialysis. We will consult palliative care and  Will discuss  With family ,may be appropriate for hospice Transfer to tele  All the records are reviewed and case discussed with Care Management/Social  Worker. Management plans discussed with the patient, family and they are in agreement.  CODE STATUS: FULL CODE  TOTAL TIME TAKING CARE OF THIS PATIENT: 35 minutes.     POSSIBLE D/C IN 2-3 DAYS, DEPENDING ON CLINICAL CONDITION.   Katha Hamming M.D on 04/15/2015 at 1:38 PM  Between 7am to 6pm - Pager - [phone]  After 6pm go to [email or web address] - password [email or web address] [facility]  [facility]  Office  [phone]  CC: Primary care physician; Amy L Krebs, NP  Note: This dictation was prepared with Dragon dictation along with smaller phrase technology. Any transcriptional errors that result from this process are unintentional.

## 2015-04-15 NOTE — Progress Notes (Signed)
Initial Nutrition Assessment  DOCUMENTATION CODES:   Non-severe (moderate) malnutrition in context of chronic illness  INTERVENTION:  Meals and snacks: cater to pt preferences Medical Nutrition Supplement: Recommend nepro BID for added nutrition   NUTRITION DIAGNOSIS:   Increased nutrient needs related to chronic illness, wound healing as evidenced by estimated needs.    GOAL:   Patient will meet greater than or equal to 90% of their needs    MONITOR:    (Energy intake, Electrolyte and renal profile)  REASON FOR ASSESSMENT:    (dialysis pt)    ASSESSMENT:      Pt admitted with seizures after dialysis  Past Medical History  Diagnosis Date  . Stroke (HCC)   . MI (myocardial infarction) (HCC)   . Hearing impaired   . Hyperlipidemia   . Hypertension   . Diabetes mellitus without complication (HCC)   . Depression   . Coronary artery disease   . Dementia   . Secondary hyperparathyroidism (HCC)   . Chronic systolic CHF (congestive heart failure) (HCC)   . GERD (gastroesophageal reflux disease)   . Personal history of transient ischemic attack (TIA) and cerebral infarction without residual deficit   . Anemia   . Moderate aortic stenosis     a. echo 04/2014: 40-50%, moderate AS b. echo 01/2015: EF 35-40%, Severe AS, mild MR, moderate TR  . ESRD (end stage renal disease) (HCC)     a. Dialysis MWF    Current Nutrition: ate both pancakes this am  Food/Nutrition-Related History: pt unable to tell this writer about intake prior to admission   Scheduled Medications:  . aspirin EC  81 mg Oral Daily  . atorvastatin  80 mg Oral Daily  . clopidogrel  75 mg Oral Daily  . insulin aspart  0-9 Units Subcutaneous Q6H  . levETIRAcetam  500 mg Intravenous Q M,W,F-2000  . levETIRAcetam  750 mg Intravenous Q24H  . sodium chloride  3 mL Intravenous Q12H       Electrolyte/Renal Profile and Glucose Profile:   Recent Labs Lab 04/13/15 0748 04/13/15 1929 04/14/15 0914   NA 136 138 137  K 3.8 3.5 3.7  CL 92* 92* 91*  CO2 27 32 30  BUN 34* 38* 43*  CREATININE 3.92* 4.22* 4.79*  CALCIUM 8.8* 9.0 9.0  PHOS  --   --  4.0  GLUCOSE 244* 140* 103*   Protein Profile:   Recent Labs Lab 04/13/15 0128 04/13/15 1929 04/14/15 0914  ALBUMIN 3.3* 3.4* 3.0*    Gastrointestinal Profile: Last BM: 1/24   Nutrition-Focused Physical Exam Findings: Nutrition-Focused physical exam completed. Findings are no fat depletion, normal to moderate muscle depletion, and unable to assess edema.      Weight Change: 7% wt loss in the last 2 months    Diet Order:  Diet renal with fluid restriction Fluid restriction:: Other (see comments); Room service appropriate?: Yes; Fluid consistency:: Thin  Skin:   pressure ulcer stage I and deep tissue injury noted   Height:   Ht Readings from Last 1 Encounters:  04/13/15  (1.626 m)    Weight:   Wt Readings from Last 1 Encounters:  04/15/15 129 lb 10.1 oz (58.8 kg)    Ideal Body Weight:     BMI:  Body mass index is 22.24 kg/(m^2).  Estimated Nutritional Needs:   Kcal:  BEE 1100 kcals (IF 1.0-1.2, AF 1.2) 1320-1716 kcals/d.   Protein:  (1.2-1.5 g/kg) 71-89 g/d   Fluid:  (1000 +UOP)  EDUCATION  NEEDS:   No education needs identified at this time  MODERATE Care Level   Henchy Mccauley B. Freida Busman, RD, LDN [phone] (pager) Weekend/On-Call pager [phone])

## 2015-04-15 NOTE — Telephone Encounter (Signed)
Patient is at [facility] and unable to be seen outpatient at this time.  3 attempts to schedule from recall.  Daughter will call when patient can be seen.  Deleting recall from list.

## 2015-04-15 NOTE — Progress Notes (Signed)
Report called to [place] RN 2A. All questions answered. Patient placed on portable telemetry monitor. No needs, no distress at this time. Will place on med/surg bed and moved to room 243.

## 2015-04-16 DIAGNOSIS — D631 Anemia in chronic kidney disease: Secondary | ICD-10-CM

## 2015-04-16 DIAGNOSIS — I69351 Hemiplegia and hemiparesis following cerebral infarction affecting right dominant side: Secondary | ICD-10-CM

## 2015-04-16 DIAGNOSIS — R569 Unspecified convulsions: Secondary | ICD-10-CM

## 2015-04-16 DIAGNOSIS — I252 Old myocardial infarction: Secondary | ICD-10-CM

## 2015-04-16 DIAGNOSIS — E119 Type 2 diabetes mellitus without complications: Secondary | ICD-10-CM

## 2015-04-16 DIAGNOSIS — F0390 Unspecified dementia without behavioral disturbance: Secondary | ICD-10-CM

## 2015-04-16 DIAGNOSIS — Z7982 Long term (current) use of aspirin: Secondary | ICD-10-CM

## 2015-04-16 DIAGNOSIS — H9190 Unspecified hearing loss, unspecified ear: Secondary | ICD-10-CM

## 2015-04-16 DIAGNOSIS — I120 Hypertensive chronic kidney disease with stage 5 chronic kidney disease or end stage renal disease: Secondary | ICD-10-CM

## 2015-04-16 DIAGNOSIS — E46 Unspecified protein-calorie malnutrition: Secondary | ICD-10-CM

## 2015-04-16 DIAGNOSIS — Z79899 Other long term (current) drug therapy: Secondary | ICD-10-CM

## 2015-04-16 DIAGNOSIS — R1310 Dysphagia, unspecified: Secondary | ICD-10-CM

## 2015-04-16 DIAGNOSIS — I5022 Chronic systolic (congestive) heart failure: Secondary | ICD-10-CM

## 2015-04-16 DIAGNOSIS — F329 Major depressive disorder, single episode, unspecified: Secondary | ICD-10-CM

## 2015-04-16 DIAGNOSIS — Z515 Encounter for palliative care: Secondary | ICD-10-CM

## 2015-04-16 DIAGNOSIS — N2581 Secondary hyperparathyroidism of renal origin: Secondary | ICD-10-CM

## 2015-04-16 LAB — CBC
HEMATOCRIT: 32.4 % — AB (ref 35.0–47.0)
HEMOGLOBIN: 10.7 g/dL — AB (ref 12.0–16.0)
MCH: 31.4 pg (ref 26.0–34.0)
MCHC: 33 g/dL (ref 32.0–36.0)
MCV: 95.2 fL (ref 80.0–100.0)
Platelets: 199 10*3/uL (ref 150–440)
RBC: 3.4 MIL/uL — AB (ref 3.80–5.20)
RDW: 24.8 % — AB (ref 11.5–14.5)
WBC: 5.2 10*3/uL (ref 3.6–11.0)

## 2015-04-16 LAB — RENAL FUNCTION PANEL
ANION GAP: 14 (ref 5–15)
Albumin: 3 g/dL — ABNORMAL LOW (ref 3.5–5.0)
BUN: 38 mg/dL — AB (ref 6–20)
CHLORIDE: 94 mmol/L — AB (ref 101–111)
CO2: 28 mmol/L (ref 22–32)
Calcium: 8.6 mg/dL — ABNORMAL LOW (ref 8.9–10.3)
Creatinine, Ser: 4.67 mg/dL — ABNORMAL HIGH (ref 0.44–1.00)
GFR calc Af Amer: 10 mL/min — ABNORMAL LOW (ref >60)
GFR, EST NON AFRICAN AMERICAN: 9 mL/min — AB (ref >60)
Glucose, Bld: 117 mg/dL — ABNORMAL HIGH (ref 65–99)
PHOSPHORUS: 3.6 mg/dL (ref 2.5–4.6)
POTASSIUM: 3.6 mmol/L (ref 3.5–5.1)
Sodium: 136 mmol/L (ref 135–145)

## 2015-04-16 LAB — GLUCOSE, CAPILLARY
GLUCOSE-CAPILLARY: 120 mg/dL — AB (ref 65–99)
GLUCOSE-CAPILLARY: 196 mg/dL — AB (ref 65–99)
Glucose-Capillary: 122 mg/dL — ABNORMAL HIGH (ref 65–99)
Glucose-Capillary: 98 mg/dL (ref 65–99)
Glucose-Capillary: 140 mg/dL — ABNORMAL HIGH (ref 65–99)

## 2015-04-16 MED ORDER — LEVETIRACETAM 750 MG PO TABS
750.0000 mg | ORAL_TABLET | ORAL | Status: DC
  Filled 2015-04-16: qty 1

## 2015-04-16 MED ORDER — LEVETIRACETAM 500 MG PO TABS
500.0000 mg | ORAL_TABLET | ORAL | Status: DC
  Administered 2015-04-16 – 2015-04-19 (×2): 500 mg via ORAL
  Filled 2015-04-16 (×2): qty 1

## 2015-04-16 MED ORDER — LEVETIRACETAM 750 MG PO TABS
750.0000 mg | ORAL_TABLET | ORAL | Status: DC
  Administered 2015-04-17 – 2015-04-21 (×5): 750 mg via ORAL
  Filled 2015-04-16 (×7): qty 1

## 2015-04-16 NOTE — Consult Note (Signed)
Palliative Medicine Inpatient Consult Note   Name: [name] Date: 04/16/2015 MRN: [id number]  DOB: 22-Nov-1945  Referring Physician: Katha Hamming, MD  Palliative Care consult requested for this 70 y.o. female for goals of medical therapy in patient with altered mental status and a number of other medical conditions as outlined below.  Pt is well known to me from pt's December hospital admission.  At that time, the patient's daughter wanted pt to remain full code and she was hopeful pt would start being able to sit up for dialysis again --which pt did start doing prior to discharge. She was discharged to [facility] but she had been discharged home (after 20 days of STR) prior to this admission.   Daughter has informed social worker that pt 'used to walk' before [facility] (that was also prior to pts cardiac arrest which resulted in last admission).  She wants more aggressive therapy for pt after this hospital stay --per notes I have reviewed.  Pt may not have many STR days left, however.   -------------------------------------------------------------------------------  PLAN: I Recall my conversations with pt's daughter quite well and feel it is likely that she will not want to change pts code status when I talk with her again.  However, that could happen, so I will plan to talk with her on Monday--since it is too late to call her tonight (in an non-emergent situation).    Pt would be an excellent candidate for Hospice due to her aortic stenosis, ESRD, and advanced dementia, but as long as daughter desires for her mother to keep getting dialysis then that won't be an option.    I also think pt is not as good of a therapy candidate as daughter would like to think she is.  This is primarily due to advancing aortic stenosis with hypotension worsening her ability to stand and walk.  Hypotension is going to occur with dialysis.      I will see if I can contribute to helping daughter  understand things are changing for the worse for her mother.  It seems that the daughter has a strong interest in keeping her mother in a facility.  Perhaps she might at one point be open to [facility].  But probably not yet.    ------------------------------------------------  IMPRESSION: Acute Encephalopathy ---thought to be due to a seizure --back to baseline ESRD --able to help with her own positioning somewhat  Acute hypoxic respiratory failure with CODE BLUE called  ---occurred 02/04/15 secondary to fistulogram procedure with angioedema Having occurred. 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   SEVERE AORTIC STENOSIS --per echo 11/21 Metabolic Encephalopathy --due to illness/ anoxia? Moderate Malnutrition due to dementia   ------------------------------------------------------------------------------------  REVIEW OF SYSTEMS:  Patient is not able to provide ROS due to severe illness  SPIRITUAL SUPPORT SYSTEM: Yes  --  family.  SOCIAL HISTORY:  reports that she has never smoked. She does not have any smokeless tobacco history on file. She reports that she does not drink alcohol or use illicit drugs.  LEGAL DOCUMENTS:  none  CODE STATUS: Full  code    PAST MEDICAL HISTORY: Past Medical History  Diagnosis Date  . Stroke (HCC)   . MI (myocardial infarction) (HCC)   . Hearing impaired   . Hyperlipidemia   . Hypertension   . Diabetes mellitus without complication (HCC)   . Depression   . Coronary artery disease   . Dementia   . Secondary hyperparathyroidism (HCC)   . Chronic systolic CHF (congestive heart failure) (HCC)   . GERD (gastroesophageal reflux disease)   . Personal history of transient ischemic attack (TIA) and cerebral infarction without residual deficit   . Anemia   . Moderate aortic stenosis     a. echo 04/2014: 40-50%, moderate AS b. echo 01/2015: EF 35-40%, Severe AS, mild MR, moderate TR  . ESRD (end stage renal disease) (HCC)     a. Dialysis MWF    PAST SURGICAL HISTORY:  Past Surgical History  Procedure Laterality Date  . Cataract extraction, bilateral    . Wrist surgery    . Peripheral vascular catheterization Left 07/28/2014    Procedure: A/V Shuntogram/Fistulagram;  Surgeon: Renford Dills, MD;  Location: [facility] INVASIVE CV LAB;  Service: Cardiovascular;  Laterality: Left;  . Peripheral vascular catheterization Left 07/28/2014    Procedure: A/V Shunt Intervention;  Surgeon: Renford Dills, MD;  Location: [facility] INVASIVE CV LAB;  Service: Cardiovascular;  Laterality: Left;  . Peripheral vascular catheterization Left 02/04/2015    Procedure: A/V Shuntogram/Fistulagram;  Surgeon: Annice Needy, MD;  Location: [facility] INVASIVE CV LAB;  Service: Cardiovascular;  Laterality: Left;  . Peripheral vascular catheterization Left 02/04/2015    Procedure: A/V Shunt Intervention;  Surgeon: Annice Needy, MD;  Location: [facility] INVASIVE CV LAB;  Service: Cardiovascular;  Laterality: Left;    ALLERGIES:  is allergic to contrast media.  MEDICATIONS:  Current Facility-Administered Medications  Medication Dose Route Frequency Provider Last Rate Last Dose  . aspirin EC tablet 81 mg  81 mg Oral Daily Oralia Manis, MD   81 mg  at 04/16/15 1344  . atorvastatin (LIPITOR) tablet 80 mg  80 mg Oral Daily Oralia Manis, MD   80 mg at 04/16/15 1345  . clopidogrel (PLAVIX) tablet 75 mg  75 mg Oral Daily Oralia Manis, MD   75 mg at 04/16/15 1344  . feeding supplement (NEPRO CARB STEADY) liquid 237 mL  237 mL Oral BID BM Katha Hamming, MD   237 mL at 04/16/15 1555  . insulin aspart (novoLOG) injection 0-9 Units  0-9 Units Subcutaneous Q6H Oralia Manis, MD   2 Units at 04/16/15 1814  . levETIRAcetam (KEPPRA) tablet 500 mg  500 mg Oral Q M,W,F-2000 Crystal Hedwig Morton, RPH   500 mg at 04/16/15 2100  . [START ON 04/17/2015] levETIRAcetam (KEPPRA) tablet 750 mg  750 mg Oral Q24H Crystal G Scarpena, RPH      . ondansetron (ZOFRAN) tablet 4 mg  4 mg Oral Q6H PRN Oralia Manis, MD       Or  . ondansetron [facility]) injection 4 mg  4 mg Intravenous Q6H PRN Oralia Manis, MD      . sodium chloride 0.9 % injection 3 mL  3 mL Intravenous Q12H Oralia Manis, MD   3 mL at 04/16/15 2100    Vital  Signs: BP 110/49 mmHg  Pulse 79  Temp(Src) 97.9 F (36.6 C) (Oral)  Resp 22  Ht  (1.626 m)  Wt 57.3 kg (126 lb 5.2 oz)  BMI 21.67 kg/m2  SpO2 100% Filed Weights   04/16/15 0517 04/16/15 0925 04/16/15 1250  Weight: 57.924 kg (127 lb 11.2 oz) 57.924 kg (127 lb 11.2 oz) 57.3 kg (126 lb 5.2 oz)    Estimated body mass index is 21.67 kg/(m^2) as calculated from the following:   Height as of this encounter:  (1.626 m).   Weight as of this encounter: 57.3 kg (126 lb 5.2 oz).  PERFORMANCE STATUS (ECOG) : 4 - Bedbound  PHYSICAL EXAM: Lethargic Eyes closed No JVD or TM Hrt rrr with 2/6 AS murmur Lungs with decreased BS bases Abd soft with nl BS Ext no mottling or cyanosis   LABS: CBC:    Component Value Date/Time   WBC 5.2 04/16/2015 0845   WBC 4.1 04/24/2014 1037   HGB 10.7* 04/16/2015 0845   HGB 8.3* 04/24/2014 1037   HCT 32.4* 04/16/2015 0845   HCT 25.1* 04/24/2014 1037   PLT 199 04/16/2015 0845   PLT 123* 04/24/2014  1037   MCV 95.2 04/16/2015 0845   MCV 90 04/24/2014 1037   NEUTROABS 5.1 04/13/2015 0128   NEUTROABS 2.8 04/24/2014 1037   LYMPHSABS 0.6* 04/13/2015 0128   LYMPHSABS 0.7* 04/24/2014 1037   MONOABS 0.7 04/13/2015 0128   MONOABS 0.4 04/24/2014 1037   EOSABS 0.0 04/13/2015 0128   EOSABS 0.2 04/24/2014 1037   BASOSABS 0.0 04/13/2015 0128   BASOSABS 0.0 04/24/2014 1037   Comprehensive Metabolic Panel:    Component Value Date/Time   NA 136 04/16/2015 0846   NA 133* 04/24/2014 1037   K 3.6 04/16/2015 0846   K 4.5 04/24/2014 1037   CL 94* 04/16/2015 0846   CL 93* 04/24/2014 1037   CO2 28 04/16/2015 0846   CO2 30 04/24/2014 1037   BUN 38* 04/16/2015 0846   BUN 48* 04/24/2014 1037   CREATININE 4.67* 04/16/2015 0846   CREATININE 6.12* 04/24/2014 1037   GLUCOSE 117* 04/16/2015 0846   GLUCOSE 229* 04/24/2014 1037   CALCIUM 8.6* 04/16/2015 0846   CALCIUM 8.1* 04/24/2014 1037   AST 31 04/13/2015 1929   AST 79* 07/16/2013 1750   ALT 24 04/13/2015 1929   ALT 24 07/16/2013 1750   ALKPHOS 81 04/13/2015 1929   ALKPHOS 107 07/16/2013 1750   BILITOT 0.7 04/13/2015 1929   BILITOT 0.4 07/16/2013 1750   PROT 6.4* 04/13/2015 1929   PROT 8.1 07/16/2013 1750   ALBUMIN 3.0* 04/16/2015 0846   ALBUMIN 2.7* 04/24/2014 1037    More than 50% of the visit was spent in counseling/coordination of care: Yes  Time Spent:  55 minutes

## 2015-04-16 NOTE — Progress Notes (Signed)
Key Points: Use following P&T approved IV to PO antibiotic change policy.  CONCERNING: IV to Oral Route Change Policy  RECOMMENDATION: This patient is receiving levetiracetam by the intravenous route.  Based on criteria approved by the Pharmacy and Therapeutics Committee, the intravenous medication(s) is/are being converted to the equivalent oral dose form(s).   DESCRIPTION: These criteria include:  The patient is eating (either orally or via tube) and/or has been taking other orally administered medications for a least 24 hours  The patient has no evidence of active gastrointestinal bleeding or impaired GI absorption (gastrectomy, short bowel, patient on TNA or NPO).  If you have questions about this conversion, please contact the Pharmacy Department    [phone] )  [facility]   [phone] )  [facility]   [phone] )  [facility]   [phone] )  [facility]   [phone] )  [facility]   [place], [facility] 04/16/2015 2:18 PM

## 2015-04-16 NOTE — Progress Notes (Signed)
PT Cancellation Note  Patient Details Name: [name] MRN: [id number] DOB: 02/12/1946   Cancelled Treatment:    Reason Eval/Treat Not Completed: Patient at procedure or test/unavailable (Consult received.  Patient currently off unit for dialysis.  Will re-attempt at later time/date as patient medically appropriate and available for treatment.)   Damichael Hofman H. Manson Passey, PT, DPT, NCS 04/16/2015, 11:20 AM [phone]

## 2015-04-16 NOTE — Care Management Note (Signed)
Case Management Note  Patient Details  Name: [name] MRN: [id number] Date of Birth: [birth year]  Subjective/Objective:  TC to daughter, Randye Lobo. Patient has been at [facility] until last Monday. She was discharged home after 20 days of STR to live with her daughter. Daughter is requesting she go back to rehab at discharge as she feels patient needs additional PT services and she does not respond well to home health services. Explained PT consult was pending. It is noted that patient overall prognosis is poor. Palliative care consult pending.                  Action/Plan: Following progression. PT and palliative care consults pending.   Expected Discharge Date:                  Expected Discharge Plan:  Skilled Nursing Facility  In-House Referral:     Discharge planning Services  CM Consult  Post Acute Care Choice:    Choice offered to:     DME Arranged:    DME Agency:     HH Arranged:    HH Agency:     Status of Service:  In process, will continue to follow  Medicare Important Message Given:  Yes Date Medicare IM Given:    Medicare IM give by:    Date Additional Medicare IM Given:    Additional Medicare Important Message give by:     If discussed at Long Length of Stay Meetings, dates discussed:    Additional Comments:  Marily Memos, RN 04/16/2015, 1:10 PM

## 2015-04-16 NOTE — Care Management Important Message (Signed)
Important Message  Patient Details  Name: [name] MRN: [id number] Date of Birth: 02-May-1945   Medicare Important Message Given:  Yes    [name] A Lillyana Majette 04/16/2015, 11:23 AM

## 2015-04-16 NOTE — Progress Notes (Signed)
Hemodialysis start 

## 2015-04-16 NOTE — Progress Notes (Signed)
PHARMACY - CRITICAL CARE PROGRESS NOTE  Pharmacy Consult for Renal adjustment    Allergies  Allergen Reactions  . Contrast Media [Iodinated Diagnostic Agents] Anaphylaxis    Patient Measurements: Height:  (162.6 cm) Weight: 126 lb 5.2 oz (57.3 kg) IBW/kg (Calculated) : 54.7   Vital Signs: Temp: 98.3 F (36.8 C) (01/27 0925) Temp Source: Oral (01/27 0925) BP: 128/63 mmHg (01/27 1250) Pulse Rate: 75 (01/27 1250) Intake/Output from previous day: 01/26 0701 - 01/27 0700 In: 200 [P.O.:200] Out: 0  Intake/Output from this shift: Total I/O In: 120 [P.O.:120] Out: 500 [Other:500] Vent settings for last 24 hours:    Labs:  Recent Labs  04/13/15 1929 04/13/15 2322 04/14/15 0914 04/16/15 0845 04/16/15 0846  WBC  --   --  6.7 5.2  --   HGB  --   --  10.5* 10.7*  --   HCT  --   --  32.4* 32.4*  --   PLT  --   --  157 199  --   APTT  --  39*  --   --   --   INR  --  1.23  --   --   --   CREATININE 4.22*  --  4.79*  --  4.67*  PHOS  --   --  4.0  --  3.6  ALBUMIN 3.4*  --  3.0*  --  3.0*  PROT 6.4*  --   --   --   --   AST 31  --   --   --   --   ALT 24  --   --   --   --   ALKPHOS 81  --   --   --   --   BILITOT 0.7  --   --   --   --    Estimated Creatinine Clearance: 9.8 mL/min (by C-G formula based on Cr of 4.67).   Recent Labs  04/16/15 0023 04/16/15 0445 04/16/15 0730  GLUCAP 120* 122* 98    Microbiology: Recent Results (from the past 720 hour(s))  Culture, blood (routine x 2)     Status: None (Preliminary result)   Collection Time: 04/13/15  2:26 AM  Result Value Ref Range Status   Specimen Description BLOOD BLOOD RIGHT FOREARM  Final   Special Requests BOTTLES DRAWN AEROBIC AND ANAEROBIC  Final   Culture NO GROWTH 3 DAYS  Final   Report Status PENDING  Incomplete  Culture, blood (routine x 2)     Status: None (Preliminary result)   Collection Time: 04/13/15  2:50 AM  Result Value Ref Range Status   Specimen Description BLOOD RIGHT HAND   Final   Special Requests BOTTLES DRAWN AEROBIC AND ANAEROBIC  Final   Culture NO GROWTH 3 DAYS  Final   Report Status PENDING  Incomplete  MRSA PCR Screening     Status: None   Collection Time: 04/13/15  6:18 AM  Result Value Ref Range Status   MRSA by PCR NEGATIVE NEGATIVE Final    Comment:        The GeneXpert MRSA Assay (FDA approved for NASAL specimens only), is one component of a comprehensive MRSA colonization surveillance program. It is not intended to diagnose MRSA infection nor to guide or monitor treatment for MRSA infections.     Medications:  Scheduled:  . aspirin EC  81 mg Oral Daily  . atorvastatin  80 mg Oral Daily  . clopidogrel  75  mg Oral Daily  . feeding supplement (NEPRO CARB STEADY)  237 mL Oral BID BM  . insulin aspart  0-9 Units Subcutaneous Q6H  . levETIRAcetam  500 mg Intravenous Q M,W,F-2000  . levETIRAcetam  750 mg Intravenous Q24H  . sodium chloride  3 mL Intravenous Q12H   Infusions:    Assessment: Pharmacy consulted for renal adjustment of medications for 70 yo female ICU patient receiving regularly scheduled dialysis on Monday/Wednesday/Friday.    Plan:  Will continue patient on Keppra  Q24hr and supplemental  to be given after dialysis on dialysis days.   No further adjustments warranted at this time.   Pharmacy will continue to monitor and adjust per consult.   Kue Fox G 04/16/2015,12:56 PM

## 2015-04-16 NOTE — Progress Notes (Signed)
[facility]   PATIENT NAME: [name]    MR#:  [id number]  DATE OF BIRTH:  09-Mar-1946  SUBJECTIVE: Getting hemodialysis. Patient is comfortable.h As baseline dementia.   CHIEF COMPLAINT:   Chief Complaint  Patient presents with  . Altered Mental Status   REVIEW OF SYSTEMS:  Review of Systems  Unable to perform ROS: mental acuity   DRUG ALLERGIES:   Allergies  Allergen Reactions  . Contrast Media [Iodinated Diagnostic Agents] Anaphylaxis   VITALS:  Blood pressure 123/64, pulse 71, temperature 98.3 F (36.8 C), temperature source Oral, resp. rate 18, height  (1.626 m), weight 57.924 kg (127 lb 11.2 oz), SpO2 100 %. PHYSICAL EXAMINATION:  Physical Exam  Constitutional: She is well-developed, well-nourished, and in no distress.  HENT:  Head: Normocephalic and atraumatic.  Eyes: Conjunctivae and EOM are normal. Pupils are equal, round, and reactive to light.  Neck: Normal range of motion. Neck supple. No tracheal deviation present. No thyromegaly present.  Cardiovascular: Normal rate and regular rhythm.   Murmur heard.  Systolic murmur is present  Ejection systolic murmur present.  Pulmonary/Chest: Effort normal and breath sounds normal. No respiratory distress. She has no wheezes. She exhibits no tenderness.  Abdominal: Soft. Bowel sounds are normal. She exhibits no distension. There is no tenderness.  Musculoskeletal: Normal range of motion.  Neurological: She is alert. No cranial nerve deficit.  Unable to follow full neuro exam due to dementia,  Skin: Skin is warm and dry. No rash noted.  Psychiatric: Mood and affect normal.   LABORATORY PANEL:   CBC  Recent Labs Lab 04/16/15 0845  WBC 5.2  HGB 10.7*  HCT 32.4*  PLT 199   ------------------------------------------------------------------------------------------------------------------ Chemistries   Recent Labs Lab 04/13/15 1929  04/16/15 0846  NA  138  < > 136  K 3.5  < > 3.6  CL 92*  < > 94*  CO2 32  < > 28  GLUCOSE 140*  < > 117*  BUN 38*  < > 38*  CREATININE 4.22*  < > 4.67*  CALCIUM 9.0  < > 8.6*  AST 31  --   --   ALT 24  --   --   ALKPHOS 81  --   --   BILITOT 0.7  --   --   < > = values in this interval not displayed. RADIOLOGY:  No results found. ASSESSMENT AND PLAN:  * Acute encephalopathy - likely due to seizure resolved and now she is back to baseline.   * Seizures (HCC) -Keppra dose is adjusted.had EEG,results pending Continue Keppra.    * Acute on chronic systolic CHF (congestive heart failure) (HCC) -on hemodialysis as per nephrology.  Has severe AS - NSTEMI due to AS;heparin drip received.now ASA,plavix and high intensity statins.  -- Without aortic valve surgery,patient's symptoms will continue to progress, worsening cardiac failure, shortness of breath, troponin elevations, near-syncopal syncope, difficulty with hemodialysis .  -   * Elevated troponin - likely secondary to ischemia in the setting of coronary artery disease, ischemic heart myopathy, exacerbated by severe aortic valve stenosis. * End stage renal disease on dialysis [facility]) - nephrology consult for hemodialysis support,  * Type 2 diabetes mellitus with other circulatory complications (HCC) - sliding scale insulin and corresponding glucose checks   * CAD (coronary artery disease) - continue home meds, cardiology consult another cardiac workup as above  * Hyperlipidemia - continue home dose statin Hypertension ;now hypotensive.  Will hold the blood pressure medication.  Conditioning, physical therapy consult today.   prognosis Is poor secondary to severe aortic stenosis, dementia, ESRD on hemodialysis.    All the records are reviewed and case discussed with Care Management/Social Worker. Management plans discussed with the patient, family and they are in agreement.  CODE STATUS: FULL CODE  TOTAL TIME TAKING CARE OF THIS PATIENT: 35  minutes.     POSSIBLE D/C IN 2-3 DAYS, DEPENDING ON CLINICAL CONDITION.   Katha Hamming M.D on 04/16/2015 at 12:42 PM  Between 7am to 6pm - Pager - [phone]  After 6pm go to [email or web address] - password [email or web address] [facility]  [place]  [facility]  Office  [phone]  CC: Primary care physician; Amy L Krebs, NP  Note: This dictation was prepared with Dragon dictation along with smaller phrase technology. Any transcriptional errors that result from this process are unintentional.

## 2015-04-16 NOTE — [place] FL2 (Signed)
[place] MEDICAID FL2 LEVEL OF CARE SCREENING TOOL     IDENTIFICATION  Patient Name: [name] Birthdate: 1945-05-27 Sex: female Admission Date (Current Location): 04/13/2015  [place] and [place] Number:  Chiropodist and Address:  [facility], [address], [place], [place] [address]      Provider Number: [id number]  Attending Physician Name and Address:  Katha Hamming, MD  Relative Name and Phone Number:       Current Level of Care: Hospital Recommended Level of Care: Skilled Nursing Facility Prior Approval Number:    Date Approved/Denied:   PASRR Number:  [id number])  Discharge Plan: SNF    Current Diagnoses: Patient Active Problem List   Diagnosis Date Noted  . Malnutrition of moderate degree 04/15/2015  . Acute on chronic systolic CHF (congestive heart failure) (HCC) 04/13/2015  . Elevated troponin 04/13/2015  . Acute encephalopathy 04/13/2015  . CAD (coronary artery disease) 04/13/2015  . GERD (gastroesophageal reflux disease) 04/13/2015  . Pressure ulcer 04/13/2015  . Altered mental status   . Aortic valve stenosis, severe   . Cardiomyopathy, ischemic   . Metabolic encephalopathy   . Respiratory failure (HCC)   . Dyspnea   . Severe aortic valve stenosis   . 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 NSTEMI (non-ST elevated myocardial infarction) (HCC)   . Cardiomyopathy, hypertensive, benign   . Angioedema 02/05/2015  . Acute respiratory failure (HCC) 02/05/2015  . Seizures (HCC) 12/02/2014  . Hyperlipidemia 08/11/2014  . End stage renal disease on dialysis (HCC) 05/12/2014  . Chronic systolic CHF (congestive heart failure) (HCC) 05/12/2014  . Secondary hypertension, unspecified 05/12/2014  . Type 2 diabetes mellitus with other circulatory complications (HCC) 05/12/2014  . Moderate aortic valve stenosis 05/12/2014     Orientation RESPIRATION BLADDER Height & Weight    Self, Time, Situation, Place  O2 (Nasal Cannula 2 (L/min) ) Continent  (162.6 cm) 126 lbs.  BEHAVIORAL SYMPTOMS/MOOD NEUROLOGICAL BOWEL NUTRITION STATUS   (None )  (None ) Continent Diet (DYS 3)  AMBULATORY STATUS COMMUNICATION OF NEEDS Skin   Extensive Assist Verbally Other (Comment) (Pressure Ulcer stage 1 Mid Sacrum & Pressure Ulcer Deep Tissue Injury Bilateral Heel)                       Personal Care Assistance Level of Assistance  Bathing, Feeding, Dressing Bathing Assistance: Limited assistance Feeding assistance: Independent Dressing Assistance: Limited assistance     Functional Limitations Info  Sight, Hearing, Speech Sight Info: Adequate Hearing Info: Impaired Speech Info: Adequate    SPECIAL CARE FACTORS FREQUENCY  PT (By licensed PT)     PT Frequency:  (5)              Contractures      Additional Factors Info  Allergies, Code Status, Insulin Sliding Scale Code Status Info:  (Full Code ) Allergies Info:  (Contrast Media)   Insulin Sliding Scale Info:  (insulin aspart (novoLOG) injection 0-9 Units- Every 6 hours )       Current Medications (04/16/2015):  This is the current hospital active medication list Current Facility-Administered Medications  Medication Dose Route Frequency Provider Last Rate Last Dose  . aspirin EC tablet 81 mg  81 mg Oral Daily Oralia Manis, MD   81 mg at 04/16/15 1344  . atorvastatin (LIPITOR) tablet 80 mg  80 mg Oral Daily Oralia Manis, MD  80 mg at 04/16/15 1345  . clopidogrel (PLAVIX) tablet 75 mg  75 mg Oral Daily Oralia Manis, MD   75 mg at 04/16/15 1344  . feeding supplement (NEPRO CARB STEADY) liquid 237 mL  237 mL Oral BID BM Katha Hamming, MD   237 mL at 04/16/15 1555  . insulin aspart (novoLOG) injection 0-9 Units  0-9 Units Subcutaneous Q6H Oralia Manis, MD   1 Units at 04/16/15 [phone]  . levETIRAcetam (KEPPRA) tablet 500 mg  500 mg Oral Q  M,W,F-2000 Crystal Hedwig Morton, RPH      . [START ON 04/17/2015] levETIRAcetam (KEPPRA) tablet 750 mg  750 mg Oral Q24H Crystal G Scarpena, RPH      . ondansetron (ZOFRAN) tablet 4 mg  4 mg Oral Q6H PRN Oralia Manis, MD       Or  . ondansetron [facility]) injection 4 mg  4 mg Intravenous Q6H PRN Oralia Manis, MD      . sodium chloride 0.9 % injection 3 mL  3 mL Intravenous Q12H Oralia Manis, MD   3 mL at 04/16/15 1345     Discharge Medications: Please see discharge summary for a list of discharge medications.  Relevant Imaging Results:  Relevant Lab Results:   Additional Information  (SSN [id number])  Verta Ellen Sunkins, [name]

## 2015-04-16 NOTE — Clinical Social Work Placement (Signed)
   CLINICAL SOCIAL WORK PLACEMENT  NOTE  Date:  04/16/2015  Patient Details  Name: [name] MRN: [id number] Date of Birth: 1945/03/26  Clinical Social Work is seeking post-discharge placement for this patient at the Skilled  Nursing Facility level of care (*CSW will initial, date and re-position this form in  chart as items are completed):  No   Patient/family provided with [place] Clinical Social Work Department's list of facilities offering this level of care within the geographic area requested by the patient (or if unable, by the patient's family).  Yes   Patient/family informed of their freedom to choose among providers that offer the needed level of care, that participate in Medicare, Medicaid or managed care program needed by the patient, have an available bed and are willing to accept the patient.  Yes   Patient/family informed of [facility]'s ownership interest in [facility] and [facility], as well as of the fact that they are under no obligation to receive care at these facilities.  PASRR submitted to EDS on       PASRR number received on       Existing PASRR number confirmed on 04/16/15     FL2 transmitted to all facilities in geographic area requested by pt/family on 04/16/15     FL2 transmitted to all facilities within larger geographic area on       Patient informed that his/her managed care company has contracts with or will negotiate with certain facilities, including the following:            Patient/family informed of bed offers received.  Patient chooses bed at       Physician recommends and patient chooses bed at      Patient to be transferred to   on  .  Patient to be transferred to facility by       Patient family notified on   of transfer.  Name of family member notified:        PHYSICIAN       Additional Comment:    _______________________________________________ Idamae Lusher, [name] 04/16/2015, 4:30 PM

## 2015-04-16 NOTE — Progress Notes (Signed)
Pre-hd tx 

## 2015-04-16 NOTE — Progress Notes (Signed)
Hemodialysis completed. 

## 2015-04-16 NOTE — Clinical Social Work Note (Signed)
Clinical Social Work Assessment  Patient Details  Name: [name] MRN: [id number] Date of Birth: 05/16/1945  Date of referral:  04/16/15               Reason for consult:  Discharge Planning                Permission sought to share information with:  Facility Sport and exercise psychologist, Family Supports Permission granted to share information::  Yes, Release of Information Signed  Name::        Agency::     Relationship::   (Elenor Love [phone])  Contact Information:     Housing/Transportation Living arrangements for the past 2 months:  [place] of Information:  Adult Children Dalbert Garnet Love [phone]) Patient Interpreter Needed:  None Criminal Activity/Legal Involvement Pertinent to Current Situation/Hospitalization:  No - Comment as needed Significant Relationships:  Adult Children Dalbert Garnet Love [phone]) Lives with:  Adult Children (Elenor Love [phone]) Do you feel safe going back to the place where you live?  Yes Need for family participation in patient care:  Yes (Comment) (Elenor Love [phone])  Care giving concerns:  Patient's daughter feels that [facility] did not provide patient with the level of care.    Social Worker assessment / plan:  CSW was informed by Chief Strategy Officer that patient was recently discharged form [facility]. CSW met with patient at bedside. Patient was alert. She requested CSW to speak to her daughter Hershal Coria [phone]. CSW contacted patient's [name]. CSW informed Barnett Applebaum that a PT evaluation is pending for patient. Per Barnett Applebaum patient was at [facility]. She stated that patient could walk before admissions into [facility] but when patient was discharged from [facility] she was informed that patient could not walk. She reports that out of 7 days patient was able to walk 4 of them with assistance from family. She stated that patient walked about 5-6 feet. Barnett Applebaum reports she feels that  the staff at [facility] did not "push" patient to walk and she feels that patient needs a facility placement that will "push" her to participate. Per Barnett Applebaum she does not have an issue with speaking to [facility] directly if patient must return.  Agreeable to SNF search in [facility].   CSW contacted Wilder, admissions coordinator with [facility]. He resorts he's unsure of how many STR days patient has left. He reports he'll call CSW back with this information. CSW awaiting phone call back.   FL2/ PASRR completed and faxed out. Awaiting bed offers and PT Evaluation. CSW will continue to follow and assist.   Employment status:    Insurance information:  Medicare PT Recommendations:  Not assessed at this time Information / Referral to community resources:  [place]  Patient/Family's Response to care:  Patient's daughter is agreeable for SNF bed search to begin.   Patient/Family's Understanding of and Emotional Response to Diagnosis, Current Treatment, and Prognosis:  Patient requested CSW speak to her daughter. Patient's daughter reports she understands CSW's role. She's appreciative of CSW's assistance.   Emotional Assessment Appearance:  Appears older than stated age Attitude/Demeanor/Rapport:   (None ) Affect (typically observed):  Calm, Pleasant Orientation:  Oriented to Self, Oriented to Place, Oriented to  Time, Oriented to Situation Alcohol / Substance use:  Not Applicable Psych involvement (Current and /or in the community):  No (Comment)  Discharge Needs  Concerns to be addressed:  Discharge Planning Concerns Readmission within the last 30 days:  Current discharge risk:  Chronically ill Barriers to Discharge:  Continued Medical Work up   [employer], [name] 04/16/2015, 3:51 PM

## 2015-04-16 NOTE — Progress Notes (Signed)
[facility]  ROUNDING NOTE   Subjective:   Seen and examined hemodialysis. Tolerating treatment well.   Objective:  Vital signs in last 24 hours:  Temp:  [97.8 F (36.6 C)-98.3 F (36.8 C)] 98.3 F (36.8 C) (01/27 0925) Pulse Rate:  [72-89] 72 (01/27 1130) Resp:  [18-26] 18 (01/27 1130) BP: (100-129)/(57-68) 129/66 mmHg (01/27 1130) SpO2:  [96 %-100 %] 100 % (01/27 1100) Weight:  [57.924 kg (127 lb 11.2 oz)-62.551 kg (137 lb 14.4 oz)] 57.924 kg (127 lb 11.2 oz) (01/27 0925)  Weight change: 5.051 kg (11 lb 2.2 oz) Filed Weights   04/15/15 1700 04/16/15 0517 04/16/15 0925  Weight: 62.551 kg (137 lb 14.4 oz) 57.924 kg (127 lb 11.2 oz) 57.924 kg (127 lb 11.2 oz)    Intake/Output: I/O last 3 completed shifts: In: 305 [P.O.:200; IV Piggyback:105] Out: 0    Intake/Output this shift:  Total I/O In: 120 [P.O.:120] Out: -   Physical Exam: General: NAD, laying in bed  Head: Normocephalic, atraumatic. Moist oral mucosal membranes  Eyes: Anicteric, PERRL  Neck: Supple, trachea midline  Lungs:  Clear to auscultation  Heart: Regular rate and rhythm  Abdomen:  Soft, nontender,   Extremities: no peripheral edema.  Neurologic: Nonfocal, moving all four extremities  Skin: No lesions  Access: Left AVG    Basic Metabolic Panel:  Recent Labs Lab 04/13/15 0128 04/13/15 0748 04/13/15 1929 04/14/15 0914 04/16/15 0846  NA 134* 136 138 137 136  K 3.9 3.8 3.5 3.7 3.6  CL 91* 92* 92* 91* 94*  CO2 25 27 32 30 28  GLUCOSE 271* 244* 140* 103* 117*  BUN 30* 34* 38* 43* 38*  CREATININE 3.43* 3.92* 4.22* 4.79* 4.67*  CALCIUM 8.3* 8.8* 9.0 9.0 8.6*  PHOS  --   --   --  4.0 3.6    Liver Function Tests:  Recent Labs Lab 04/13/15 0128 04/13/15 1929 04/14/15 0914 04/16/15 0846  AST 28 31  --   --   ALT 19 24  --   --   ALKPHOS 93 81  --   --   BILITOT 0.8 0.7  --   --   PROT 6.4* 6.4*  --   --   ALBUMIN 3.3* 3.4* 3.0* 3.0*   No results for input(s): LIPASE,  AMYLASE in the last 168 hours. No results for input(s): AMMONIA in the last 168 hours.  CBC:  Recent Labs Lab 04/13/15 0128 04/13/15 0748 04/14/15 0914 04/16/15 0845  WBC 6.4 8.5 6.7 5.2  NEUTROABS 5.1  --   --   --   HGB 10.6* 11.3* 10.5* 10.7*  HCT 32.6* 34.5* 32.4* 32.4*  MCV 95.1 96.0 94.4 95.2  PLT 125* 123* 157 199    Cardiac Enzymes:  Recent Labs Lab 04/13/15 0128 04/13/15 0748 04/13/15 1248 04/13/15 1929  TROPONINI 0.12* 1.05* 3.44* 7.70*    BNP: Invalid input(s): POCBNP  CBG:  Recent Labs Lab 04/15/15 1626 04/15/15 2127 04/16/15 0023 04/16/15 0445 04/16/15 0730  GLUCAP 217* 114* 120* 122* 98    Microbiology: Results for orders placed or performed during the hospital encounter of 04/13/15  Culture, blood (routine x 2)     Status: None (Preliminary result)   Collection Time: 04/13/15  2:26 AM  Result Value Ref Range Status   Specimen Description BLOOD BLOOD RIGHT FOREARM  Final   Special Requests BOTTLES DRAWN AEROBIC AND ANAEROBIC  Final   Culture NO GROWTH 3 DAYS  Final   Report  Status PENDING  Incomplete  Culture, blood (routine x 2)     Status: None (Preliminary result)   Collection Time: 04/13/15  2:50 AM  Result Value Ref Range Status   Specimen Description BLOOD RIGHT HAND  Final   Special Requests BOTTLES DRAWN AEROBIC AND ANAEROBIC  Final   Culture NO GROWTH 3 DAYS  Final   Report Status PENDING  Incomplete  MRSA PCR Screening     Status: None   Collection Time: 04/13/15  6:18 AM  Result Value Ref Range Status   MRSA by PCR NEGATIVE NEGATIVE Final    Comment:        The GeneXpert MRSA Assay (FDA approved for NASAL specimens only), is one component of a comprehensive MRSA colonization surveillance program. It is not intended to diagnose MRSA infection nor to guide or monitor treatment for MRSA infections.     Coagulation Studies:  Recent Labs  04/13/15 2322  LABPROT 15.7*  INR 1.23    Urinalysis: No  results for input(s): COLORURINE, LABSPEC, PHURINE, GLUCOSEU, HGBUR, BILIRUBINUR, KETONESUR, PROTEINUR, UROBILINOGEN, NITRITE, LEUKOCYTESUR in the last 72 hours.  Invalid input(s): APPERANCEUR    Imaging: No results found.   Medications:     . aspirin EC  81 mg Oral Daily  . atorvastatin  80 mg Oral Daily  . clopidogrel  75 mg Oral Daily  . feeding supplement (NEPRO CARB STEADY)  237 mL Oral BID BM  . insulin aspart  0-9 Units Subcutaneous Q6H  . levETIRAcetam  500 mg Intravenous Q M,W,F-2000  . levETIRAcetam  750 mg Intravenous Q24H  . sodium chloride  3 mL Intravenous Q12H   ondansetron **OR** ondansetron (ZOFRAN) IV  Assessment/ Plan:  [name] is a 70 y.o. black female with end-stage renal disease, myocardial infarction, CVA, hyperlipidemia, hypertension, diabetes mellitus, depression, secondary hyperparathyroidism, dementia, GERD, anemia chronic kidney disease   [facility] MWF [facility] [address]   1. End-stage renal disease on hemodialysis. Tolerating hemodialysis treatment well. Through AVG - Continue MWF schedule.   2. Anemia chronic kidney disease. 10.7 - Continue to hold Epogen due to ischemic event.  3. Secondary hyperparathyroidism: phos 3.6  - at goal.  - holding calcium acetate   4. Hypertension: hypotensive overnight - holding metoprolol, isosorbide dinitrate, hydralazine  5. Diabetes mellitus type II with chronic kidney disease  - Continue glucose control.    LOS: 3 [name] 1/27/201712:00 PM

## 2015-04-17 LAB — GLUCOSE, CAPILLARY
GLUCOSE-CAPILLARY: 165 mg/dL — AB (ref 65–99)
Glucose-Capillary: 199 mg/dL — ABNORMAL HIGH (ref 65–99)
Glucose-Capillary: 228 mg/dL — ABNORMAL HIGH (ref 65–99)
Glucose-Capillary: 324 mg/dL — ABNORMAL HIGH (ref 65–99)

## 2015-04-17 MED ORDER — CALCIUM ACETATE (PHOS BINDER) 667 MG PO CAPS
667.0000 mg | ORAL_CAPSULE | Freq: Three times a day (TID) | ORAL | Status: DC
  Administered 2015-04-17 – 2015-04-21 (×11): 667 mg via ORAL
  Filled 2015-04-17 (×11): qty 1

## 2015-04-17 NOTE — Progress Notes (Signed)
PT Cancellation Note  Patient Details Name: Jadi Deyarmin MRN: [id number] DOB: Nov 22, 1945   Cancelled Treatment:     Chart reviewed and RN consulted. Attempted to evaluate patient. Patient does not communicate throughout entire attempt. Pt does not follow commands at all during attempt and makes limited eye contact with therapist. Pt staring off into distance. Will not provide name. Pt will respond to automated responses such as an extended hand for a hand shake. Pt currently not appropriate for PT evaluation. Will attempt evaluation on later date time. After reviewing chart seriously question patient's appropriateness for physical therapy given overall clinical picture and prognosis. Palliative care note indicates pt is a poor candidate for physical therapy.   Sharalyn Ink Nashly Olsson PT, DPT   Pankaj Haack 04/17/2015, 3:26 PM

## 2015-04-17 NOTE — Progress Notes (Signed)
[facility] [place] [facility]   PATIENT NAME: [name]    MR#:  [id number]  DATE OF BIRTH:  03-01-1946  SUBJECTIVE: Pleasantly confused but able to follow some commands. Finished her breakfast. Appears comfortable. Denies any complaints.   CHIEF COMPLAINT:   Chief Complaint  Patient presents with  . Altered Mental Status   REVIEW OF SYSTEMS:  Review of Systems  Unable to perform ROS: mental acuity   DRUG ALLERGIES:   Allergies  Allergen Reactions  . Contrast Media [Iodinated Diagnostic Agents] Anaphylaxis   VITALS:  Blood pressure 114/57, pulse 81, temperature 97.9 F (36.6 C), temperature source Oral, resp. rate 22, height  (1.626 m), weight 58.922 kg (129 lb 14.4 oz), SpO2 98 %. PHYSICAL EXAMINATION:  Physical Exam  Constitutional: She is well-developed, well-nourished, and in no distress.  HENT:  Head: Normocephalic and atraumatic.  Eyes: Conjunctivae and EOM are normal. Pupils are equal, round, and reactive to light.  Neck: Normal range of motion. Neck supple. No tracheal deviation present. No thyromegaly present.  Cardiovascular: Normal rate and regular rhythm.   Murmur heard.  Systolic murmur is present  Ejection systolic murmur present.  Pulmonary/Chest: Effort normal and breath sounds normal. No respiratory distress. She has no wheezes. She exhibits no tenderness.  Abdominal: Soft. Bowel sounds are normal. She exhibits no distension. There is no tenderness.  Musculoskeletal: Normal range of motion.  Neurological: She is alert. No cranial nerve deficit.  Unable to follow full neuro exam due to dementia,  Skin: Skin is warm and dry. No rash noted.  Psychiatric: Mood and affect normal.   LABORATORY PANEL:   CBC  Recent Labs Lab 04/16/15 0845  WBC 5.2  HGB 10.7*  HCT 32.4*  PLT 199   ------------------------------------------------------------------------------------------------------------------ Chemistries    Recent Labs Lab 04/13/15 1929  04/16/15 0846  NA 138  < > 136  K 3.5  < > 3.6  CL 92*  < > 94*  CO2 32  < > 28  GLUCOSE 140*  < > 117*  BUN 38*  < > 38*  CREATININE 4.22*  < > 4.67*  CALCIUM 9.0  < > 8.6*  AST 31  --   --   ALT 24  --   --   ALKPHOS 81  --   --   BILITOT 0.7  --   --   < > = values in this interval not displayed. RADIOLOGY:  No results found. ASSESSMENT AND PLAN:  * Acute encephalopathy - likely due to seizure resolved and now she is back to baseline.   * Seizures (HCC) -Keppra dose is adjusted.had EEG,results pending Continue Keppra.    * Acute on chronic systolic CHF (congestive heart failure) (HCC) -on hemodialysis as per nephrology.  Has severe AS - NSTEMI due to AS;heparin drip received.now ASA,plavix and high intensity statins.  -- Without aortic valve surgery,patient's symptoms will continue to progress, worsening cardiac failure, shortness of breath, troponin elevations, near-syncopal syncope, difficulty with hemodialysis . Appreciate  palliative care team following the patient.  -   * Elevated troponin - likely secondary to ischemia in the setting of coronary artery disease, ischemic heart myopathy, exacerbated by severe aortic valve stenosis. * End stage renal disease on dialysis [facility]) - nephrology consult for hemodialysis support,  * Type 2 diabetes mellitus with other circulatory complications (HCC) - sliding scale insulin and corresponding glucose checks   * CAD (coronary artery disease) - continue home meds, cardiology consult  another cardiac workup as above  * Hyperlipidemia - continue home dose statin Hypertension ; controlled.  deConditioning, physical therapy commended SNF placement.   prognosis Is poor secondary to progressive  severe aortic stenosis, dementia, ESRD on hemodialysis. He Seen by palliative care team, plan to have discussion with family on Monday, patient is a eligible for hospice home.   All the records are  reviewed and case discussed with Care Management/Social Worker. Management plans discussed with the patient, family and they are in agreement.  CODE STATUS: FULL CODE  TOTAL TIME TAKING CARE OF THIS PATIENT: 35 minutes.     POSSIBLE D/C IN 2-3 DAYS, DEPENDING ON CLINICAL CONDITION.   Katha Hamming M.D on 04/17/2015 at 10:58 AM  Between 7am to 6pm - Pager - [phone]  After 6pm go to [email or web address] - password [email or web address] [facility]  [facility]  Office  [phone]  CC: Primary care physician; Amy L Krebs, NP  Note: This dictation was prepared with Dragon dictation along with smaller phrase technology. Any transcriptional errors that result from this process are unintentional.

## 2015-04-17 NOTE — Progress Notes (Signed)
[facility]  ROUNDING NOTE   Subjective:   Hemodialysis yesterday. Tolerated treatment well. Uf 0.5 litre  Objective:  Vital signs in last 24 hours:  Temp:  [97.5 F (36.4 C)-98.2 F (36.8 C)] 97.9 F (36.6 C) (01/27 1929) Pulse Rate:  [38-81] 81 (01/28 0553) Resp:  [16-22] 22 (01/28 0553) BP: (107-129)/(49-69) 114/57 mmHg (01/28 0553) SpO2:  [83 %-100 %] 98 % (01/28 0553) Weight:  [57.3 kg (126 lb 5.2 oz)-58.922 kg (129 lb 14.4 oz)] 58.922 kg (129 lb 14.4 oz) (01/28 0553)  Weight change: -4.627 kg (-10 lb 3.2 oz) Filed Weights   04/16/15 0925 04/16/15 1250 04/17/15 0553  Weight: 57.924 kg (127 lb 11.2 oz) 57.3 kg (126 lb 5.2 oz) 58.922 kg (129 lb 14.4 oz)    Intake/Output: I/O last 3 completed shifts: In: 704.5 [P.O.:597; IV Piggyback:107.5] Out: 500 [Other:500]   Intake/Output this shift:     Physical Exam: General: NAD, laying in bed  Head: Normocephalic, atraumatic. Moist oral mucosal membranes  Eyes: Anicteric, PERRL  Neck: Supple, trachea midline  Lungs:  Clear to auscultation  Heart: Regular rate and rhythm  Abdomen:  Soft, nontender,   Extremities: no peripheral edema.  Neurologic: Nonfocal, moving all four extremities  Skin: No lesions  Access: Left AVG    Basic Metabolic Panel:  Recent Labs Lab 04/13/15 0128 04/13/15 0748 04/13/15 1929 04/14/15 0914 04/16/15 0846  NA 134* 136 138 137 136  K 3.9 3.8 3.5 3.7 3.6  CL 91* 92* 92* 91* 94*  CO2 25 27 32 30 28  GLUCOSE 271* 244* 140* 103* 117*  BUN 30* 34* 38* 43* 38*  CREATININE 3.43* 3.92* 4.22* 4.79* 4.67*  CALCIUM 8.3* 8.8* 9.0 9.0 8.6*  PHOS  --   --   --  4.0 3.6    Liver Function Tests:  Recent Labs Lab 04/13/15 0128 04/13/15 1929 04/14/15 0914 04/16/15 0846  AST 28 31  --   --   ALT 19 24  --   --   ALKPHOS 93 81  --   --   BILITOT 0.8 0.7  --   --   PROT 6.4* 6.4*  --   --   ALBUMIN 3.3* 3.4* 3.0* 3.0*   No results for input(s): LIPASE, AMYLASE in the last 168  hours. No results for input(s): AMMONIA in the last 168 hours.  CBC:  Recent Labs Lab 04/13/15 0128 04/13/15 0748 04/14/15 0914 04/16/15 0845  WBC 6.4 8.5 6.7 5.2  NEUTROABS 5.1  --   --   --   HGB 10.6* 11.3* 10.5* 10.7*  HCT 32.6* 34.5* 32.4* 32.4*  MCV 95.1 96.0 94.4 95.2  PLT 125* 123* 157 199    Cardiac Enzymes:  Recent Labs Lab 04/13/15 0128 04/13/15 0748 04/13/15 1248 04/13/15 1929  TROPONINI 0.12* 1.05* 3.44* 7.70*    BNP: Invalid input(s): POCBNP  CBG:  Recent Labs Lab 04/16/15 0730 04/16/15 1336 04/16/15 1809 04/17/15 0012 04/17/15 0557  GLUCAP 98 140* 196* 228* 165*    Microbiology: Results for orders placed or performed during the hospital encounter of 04/13/15  Culture, blood (routine x 2)     Status: None (Preliminary result)   Collection Time: 04/13/15  2:26 AM  Result Value Ref Range Status   Specimen Description BLOOD BLOOD RIGHT FOREARM  Final   Special Requests BOTTLES DRAWN AEROBIC AND ANAEROBIC  Final   Culture NO GROWTH 4 DAYS  Final   Report Status PENDING  Incomplete  Culture,  blood (routine x 2)     Status: None (Preliminary result)   Collection Time: 04/13/15  2:50 AM  Result Value Ref Range Status   Specimen Description BLOOD RIGHT HAND  Final   Special Requests BOTTLES DRAWN AEROBIC AND ANAEROBIC  Final   Culture NO GROWTH 4 DAYS  Final   Report Status PENDING  Incomplete  MRSA PCR Screening     Status: None   Collection Time: 04/13/15  6:18 AM  Result Value Ref Range Status   MRSA by PCR NEGATIVE NEGATIVE Final    Comment:        The GeneXpert MRSA Assay (FDA approved for NASAL specimens only), is one component of a comprehensive MRSA colonization surveillance program. It is not intended to diagnose MRSA infection nor to guide or monitor treatment for MRSA infections.     Coagulation Studies: No results for input(s): LABPROT, INR in the last 72 hours.  Urinalysis: No results for input(s):  COLORURINE, LABSPEC, PHURINE, GLUCOSEU, HGBUR, BILIRUBINUR, KETONESUR, PROTEINUR, UROBILINOGEN, NITRITE, LEUKOCYTESUR in the last 72 hours.  Invalid input(s): APPERANCEUR    Imaging: No results found.   Medications:     . aspirin EC  81 mg Oral Daily  . atorvastatin  80 mg Oral Daily  . clopidogrel  75 mg Oral Daily  . feeding supplement (NEPRO CARB STEADY)  237 mL Oral BID BM  . insulin aspart  0-9 Units Subcutaneous Q6H  . levETIRAcetam  500 mg Oral Q M,W,F-2000  . levETIRAcetam  750 mg Oral Q24H  . sodium chloride  3 mL Intravenous Q12H   ondansetron **OR** ondansetron (ZOFRAN) IV  Assessment/ Plan:  [name] is a 70 y.o. black female with end-stage renal disease, myocardial infarction, CVA, hyperlipidemia, hypertension, diabetes mellitus, depression, secondary hyperparathyroidism, dementia, GERD, anemia chronic kidney disease   [facility] MWF [facility] [address]   1. End-stage renal disease on hemodialysis. Tolerated hemodialysis treatment well. Through AVG - Continue MWF schedule. Next treatment for Monday  2. Anemia chronic kidney disease. 10.7 - Continue to hold Epogen due to ischemic event.  3. Secondary hyperparathyroidism: phos 3.6  - at goal.  - holding calcium acetate. Restart today  4. Hypertension: hypotensive overnight - holding metoprolol, isosorbide dinitrate, hydralazine  5. Diabetes mellitus type II with chronic kidney disease  - Continue glucose control.    LOS: 4 Rip Hawes 1/28/20179:52 AM

## 2015-04-18 LAB — CULTURE, BLOOD (ROUTINE X 2)
CULTURE: NO GROWTH
Culture: NO GROWTH

## 2015-04-18 LAB — GLUCOSE, CAPILLARY
GLUCOSE-CAPILLARY: 272 mg/dL — AB (ref 65–99)
Glucose-Capillary: 190 mg/dL — ABNORMAL HIGH (ref 65–99)
Glucose-Capillary: 195 mg/dL — ABNORMAL HIGH (ref 65–99)
Glucose-Capillary: 226 mg/dL — ABNORMAL HIGH (ref 65–99)
Glucose-Capillary: 245 mg/dL — ABNORMAL HIGH (ref 65–99)

## 2015-04-18 NOTE — Progress Notes (Signed)
[facility] - [place] at [facility]   PATIENT NAME: [name]    MR#:  [id number]  DATE OF BIRTH:  08-25-1945   no new complaints.  CHIEF COMPLAINT:   Chief Complaint  Patient presents with  . Altered Mental Status   REVIEW OF SYSTEMS:  Review of Systems  Unable to perform ROS: mental acuity   DRUG ALLERGIES:   Allergies  Allergen Reactions  . Contrast Media [Iodinated Diagnostic Agents] Anaphylaxis   VITALS:  Blood pressure 100/58, pulse 82, temperature 98.5 F (36.9 C), temperature source Oral, resp. rate 16, height  (1.626 m), weight 60.963 kg (134 lb 6.4 oz), SpO2 99 %. PHYSICAL EXAMINATION:  Physical Exam  Constitutional: She is well-developed, well-nourished, and in no distress.  HENT:  Head: Normocephalic and atraumatic.  Eyes: Conjunctivae and EOM are normal. Pupils are equal, round, and reactive to light.  Neck: Normal range of motion. Neck supple. No tracheal deviation present. No thyromegaly present.  Cardiovascular: Normal rate and regular rhythm.   Murmur heard.  Systolic murmur is present  Ejection systolic murmur present.  Pulmonary/Chest: Effort normal and breath sounds normal. No respiratory distress. She has no wheezes. She exhibits no tenderness.  Abdominal: Soft. Bowel sounds are normal. She exhibits no distension. There is no tenderness.  Musculoskeletal: Normal range of motion.  Neurological: She is alert. No cranial nerve deficit.  Unable to follow full neuro exam due to dementia,  Skin: Skin is warm and dry. No rash noted.  Psychiatric: Mood and affect normal.   LABORATORY PANEL:   CBC  Recent Labs Lab 04/16/15 0845  WBC 5.2  HGB 10.7*  HCT 32.4*  PLT 199   ------------------------------------------------------------------------------------------------------------------ Chemistries   Recent Labs Lab 04/13/15 1929  04/16/15 0846  NA 138  < > 136  K 3.5  < > 3.6  CL 92*  < > 94*  CO2 32  < > 28   GLUCOSE 140*  < > 117*  BUN 38*  < > 38*  CREATININE 4.22*  < > 4.67*  CALCIUM 9.0  < > 8.6*  AST 31  --   --   ALT 24  --   --   ALKPHOS 81  --   --   BILITOT 0.7  --   --   < > = values in this interval not displayed. RADIOLOGY:  No results found. ASSESSMENT AND PLAN:  * Acute encephalopathy - likely due to seizure resolved and now she is back to baseline.   * Seizures (HCC) -Keppra dose is adjusted.had EEG,results pending Continue Keppra.    * Acute on chronic systolic CHF (congestive heart failure) (HCC) -on hemodialysis as per nephrology.  Has severe AS - NSTEMI due to AS;heparin drip received.now ASA,plavix and high intensity statins.  -- Without aortic valve surgery,patient's symptoms will continue to progress, worsening cardiac failure, shortness of breath, troponin elevations, near-syncopal syncope, difficulty with hemodialysis . Appreciate  palliative care team following the patient.  -   * Elevated troponin - likely secondary to ischemia in the setting of coronary artery disease, ischemic heart myopathy, exacerbated by severe aortic valve stenosis. * End stage renal disease on dialysis [facility]) - nephrology consult for hemodialysis support,  * Type 2 diabetes mellitus with other circulatory complications (HCC) - sliding scale insulin and corresponding glucose checks   * CAD (coronary artery disease) - continue home meds, seen by cardio  * Hyperlipidemia - continue home dose statin Hypertension ; controlled.  deConditioning,  physical therapy commended SNF placement.   prognosis Is poor secondary to progressive  severe aortic stenosis, dementia, ESRD on hemodialysis. He Seen by palliative care team, plan to have discussion with family on Monday, patient is a eligible for hospice home.   All the records are reviewed and case discussed with Care Management/Social Worker. Management plans discussed with the patient, family and they are in agreement.  CODE STATUS:  FULL CODE  TOTAL TIME TAKING CARE OF THIS PATIENT: 35 minutes.     POSSIBLE D/C IN 2-3 DAYS, DEPENDING ON CLINICAL CONDITION.   Katha Hamming M.D on 04/18/2015 at 12:37 PM  Between 7am to 6pm - Pager - [phone]  After 6pm go to [email or web address] - password [email or web address] [facility]  [facility]  Office  [phone]  CC: Primary care physician; Amy L Krebs, NP  Note: This dictation was prepared with Dragon dictation along with smaller phrase technology. Any transcriptional errors that result from this process are unintentional.

## 2015-04-18 NOTE — Progress Notes (Addendum)
[facility]  ROUNDING NOTE   Subjective:   Mental status at baseline. Did not work with PT.  Hemodialysis for tomorrow.   Objective:  Vital signs in last 24 hours:  Temp:  [98.1 F (36.7 C)-98.5 F (36.9 C)] 98.5 F (36.9 C) (01/29 0541) Pulse Rate:  [84-96] 87 (01/29 0653) Resp:  [16] 16 (01/29 0541) BP: (101-126)/(53-65) 108/57 mmHg (01/29 0541) SpO2:  [89 %-100 %] 89 % (01/29 0653) Weight:  [60.963 kg (134 lb 6.4 oz)] 60.963 kg (134 lb 6.4 oz) (01/29 0541)  Weight change: 3.039 kg (6 lb 11.2 oz) Filed Weights   04/16/15 1250 04/17/15 0553 04/18/15 0541  Weight: 57.3 kg (126 lb 5.2 oz) 58.922 kg (129 lb 14.4 oz) 60.963 kg (134 lb 6.4 oz)    Intake/Output: I/O last 3 completed shifts: In: 480 [P.O.:480] Out: 0    Intake/Output this shift:     Physical Exam: General: NAD, laying in bed  Head: Normocephalic, atraumatic. Moist oral mucosal membranes  Eyes: Anicteric, PERRL  Neck: Supple, trachea midline  Lungs:  Clear to auscultation  Heart: Regular rate and rhythm, +murmur  Abdomen:  Soft, nontender,   Extremities: no peripheral edema.  Neurologic: Alert to self only  Skin: No lesions  Access: Left AVG    Basic Metabolic Panel:  Recent Labs Lab 04/13/15 0128 04/13/15 0748 04/13/15 1929 04/14/15 0914 04/16/15 0846  NA 134* 136 138 137 136  K 3.9 3.8 3.5 3.7 3.6  CL 91* 92* 92* 91* 94*  CO2 25 27 32 30 28  GLUCOSE 271* 244* 140* 103* 117*  BUN 30* 34* 38* 43* 38*  CREATININE 3.43* 3.92* 4.22* 4.79* 4.67*  CALCIUM 8.3* 8.8* 9.0 9.0 8.6*  PHOS  --   --   --  4.0 3.6    Liver Function Tests:  Recent Labs Lab 04/13/15 0128 04/13/15 1929 04/14/15 0914 04/16/15 0846  AST 28 31  --   --   ALT 19 24  --   --   ALKPHOS 93 81  --   --   BILITOT 0.8 0.7  --   --   PROT 6.4* 6.4*  --   --   ALBUMIN 3.3* 3.4* 3.0* 3.0*   No results for input(s): LIPASE, AMYLASE in the last 168 hours. No results for input(s): AMMONIA in the last 168  hours.  CBC:  Recent Labs Lab 04/13/15 0128 04/13/15 0748 04/14/15 0914 04/16/15 0845  WBC 6.4 8.5 6.7 5.2  NEUTROABS 5.1  --   --   --   HGB 10.6* 11.3* 10.5* 10.7*  HCT 32.6* 34.5* 32.4* 32.4*  MCV 95.1 96.0 94.4 95.2  PLT 125* 123* 157 199    Cardiac Enzymes:  Recent Labs Lab 04/13/15 0128 04/13/15 0748 04/13/15 1248 04/13/15 1929  TROPONINI 0.12* 1.05* 3.44* 7.70*    BNP: Invalid input(s): POCBNP  CBG:  Recent Labs Lab 04/17/15 0557 04/17/15 1117 04/17/15 1816 04/18/15 0020 04/18/15 0543  GLUCAP 165* 324* 199* 226* 195*    Microbiology: Results for orders placed or performed during the hospital encounter of 04/13/15  Culture, blood (routine x 2)     Status: None   Collection Time: 04/13/15  2:26 AM  Result Value Ref Range Status   Specimen Description BLOOD BLOOD RIGHT FOREARM  Final   Special Requests BOTTLES DRAWN AEROBIC AND ANAEROBIC  Final   Culture NO GROWTH 5 DAYS  Final   Report Status 04/18/2015 FINAL  Final  Culture, blood (routine  x 2)     Status: None   Collection Time: 04/13/15  2:50 AM  Result Value Ref Range Status   Specimen Description BLOOD RIGHT HAND  Final   Special Requests BOTTLES DRAWN AEROBIC AND ANAEROBIC  Final   Culture NO GROWTH 5 DAYS  Final   Report Status 04/18/2015 FINAL  Final  MRSA PCR Screening     Status: None   Collection Time: 04/13/15  6:18 AM  Result Value Ref Range Status   MRSA by PCR NEGATIVE NEGATIVE Final    Comment:        The GeneXpert MRSA Assay (FDA approved for NASAL specimens only), is one component of a comprehensive MRSA colonization surveillance program. It is not intended to diagnose MRSA infection nor to guide or monitor treatment for MRSA infections.     Coagulation Studies: No results for input(s): LABPROT, INR in the last 72 hours.  Urinalysis: No results for input(s): COLORURINE, LABSPEC, PHURINE, GLUCOSEU, HGBUR, BILIRUBINUR, KETONESUR, PROTEINUR, UROBILINOGEN,  NITRITE, LEUKOCYTESUR in the last 72 hours.  Invalid input(s): APPERANCEUR    Imaging: No results found.   Medications:     . aspirin EC  81 mg Oral Daily  . atorvastatin  80 mg Oral Daily  . calcium acetate  667 mg Oral TID WC  . clopidogrel  75 mg Oral Daily  . feeding supplement (NEPRO CARB STEADY)  237 mL Oral BID BM  . insulin aspart  0-9 Units Subcutaneous Q6H  . levETIRAcetam  500 mg Oral Q M,W,F-2000  . levETIRAcetam  750 mg Oral Q24H  . sodium chloride  3 mL Intravenous Q12H   ondansetron **OR** ondansetron (ZOFRAN) IV  Assessment/ Plan:  Ms. Stephenia Vogan is a 70 y.o. black female with end-stage renal disease, myocardial infarction, CVA, hyperlipidemia, hypertension, diabetes mellitus, depression, secondary hyperparathyroidism, dementia, GERD, anemia chronic kidney disease   [facility] MWF [facility] [address]   1. End-stage renal disease on hemodialysis. Through AVG - Continue MWF schedule. Next treatment for Monday  2. Anemia chronic kidney disease. 10.7 - Continue to hold Epogen due to ischemic event.  3. Secondary hyperparathyroidism: phos 3.6  - at goal.  - Continue calcium acetate  4. Hypertension: blood pressure at goal.  - holding metoprolol, isosorbide dinitrate, hydralazine  5. Diabetes mellitus type II with chronic kidney disease  - Continue glucose control. SSI   LOS: 5 Kyston Gonce 1/29/20179:54 AM

## 2015-04-18 NOTE — Evaluation (Signed)
Physical Therapy Evaluation Patient Details Name: [name] MRN: [id number] DOB: Feb 02, 1946 Today's Date: 04/18/2015   History of Present Illness  70 yo female with acute encephalopathy and On HD, has recent seizure-like activity in HD and came to ED.  Hx:  stroke, MI, HLD, HTN, DM, CAD, advanced dementia  Clinical Impression  Pt is getting up to bedside with tremendous weakness, has difficulty remaining up in bed to transition to chair.  Her plan is to return home with family assistance, as she does not have any goals to gain from inpt rehab that this PT can see.  Pt is fragile and is being considered for palliative care.    Follow Up Recommendations Home health PT;Supervision/Assistance - 24 hour    Equipment Recommendations       Recommendations for Other Services       Precautions / Restrictions Precautions Precautions: Fall (telemetry) Restrictions Weight Bearing Restrictions: No      Mobility  Bed Mobility Overal bed mobility: Needs Assistance Bed Mobility: Supine to Sit;Sit to Supine     Supine to sit: Max assist Sit to supine: Max assist   General bed mobility comments: Pt unable to assist with pulling up to bedside  Transfers Overall transfer level: Needs assistance Equipment used: None Transfers:  (refused to attempt)              Ambulation/Gait             General Gait Details: unable  Stairs            Wheelchair Mobility    Modified Rankin (Stroke Patients Only)       Balance Overall balance assessment: Needs assistance Sitting-balance support: Feet supported Sitting balance-Leahy Scale: Poor       Standing balance-Leahy Scale: Zero                               Pertinent Vitals/Pain Pain Assessment: Faces Faces Pain Scale: No hurt    Home Living Family/patient expects to be discharged to:: Private residence Living Arrangements: Children Available Help at Discharge: Family;Available 24  hours/day Type of Home: House       Home Layout: Other (Comment) (Pt could not state) Home Equipment:  (Pt could not state)      Prior Function Level of Independence:  (not able to state)               Hand Dominance        Extremity/Trunk Assessment   Upper Extremity Assessment: Generalized weakness           Lower Extremity Assessment: Generalized weakness      Cervical / Trunk Assessment: Kyphotic  Communication   Communication: Expressive difficulties  Cognition Arousal/Alertness: Lethargic Behavior During Therapy: Flat affect Overall Cognitive Status: History of cognitive impairments - at baseline       Memory: Decreased recall of precautions;Decreased short-term memory              General Comments General comments (skin integrity, edema, etc.): Pt was not energetic enough to get her to chair, but did sit bedside a short time with PT.  Pt very weak and collapses into her trunk at bedside.    Exercises        Assessment/Plan    PT Assessment Patient needs continued PT services  PT Diagnosis Generalized weakness   PT Problem List Decreased strength;Decreased range of motion;Decreased activity tolerance;Decreased balance;Decreased mobility;Decreased coordination;Decreased cognition;Decreased  knowledge of use of DME;Decreased safety awareness;Decreased knowledge of precautions;Cardiopulmonary status limiting activity  PT Treatment Interventions DME instruction;Functional mobility training;Therapeutic activities;Therapeutic exercise;Balance training;Neuromuscular re-education;Patient/family education   PT Goals (Current goals can be found in the Care Plan section) Acute Rehab PT Goals Patient Stated Goal: none stated PT Goal Formulation: Patient unable to participate in goal setting Time For Goal Achievement: 05/02/15    Frequency Min 2X/week   Barriers to discharge  (not sure of geography at home) Pt cannot state her home situation     Co-evaluation               End of Session Equipment Utilized During Treatment: Oxygen Activity Tolerance: Patient limited by fatigue;Patient limited by lethargy Patient left: in bed;with call bell/phone within reach;with bed alarm set Nurse Communication: Mobility status         Time: 1025-1055 PT Time Calculation (min) (ACUTE ONLY): 30 min   Charges:   PT Evaluation $PT Eval Low Complexity: 1 Procedure PT Treatments $Therapeutic Activity: 8-22 mins   PT G Codes:        Ivar Drape 25-Apr-2015, 2:44 PM   Samul Dada, PT MS Acute Rehab Dept. Number: [facility] [id number] and [facility] [phone]

## 2015-04-18 NOTE — Progress Notes (Signed)
HD today. 2 L of oxygen. NSR. FS are stable. Pt has not reported any pain. Trop positive but due to aortic stenosis. Takes meds crushed in apple sauce. Was assisted with feeding. Pt has no further concerns at this time.

## 2015-04-19 ENCOUNTER — Ambulatory Visit: Payer: Medicare Other | Admitting: Cardiovascular Disease

## 2015-04-19 LAB — RENAL FUNCTION PANEL
ANION GAP: 12 (ref 5–15)
Albumin: 2.8 g/dL — ABNORMAL LOW (ref 3.5–5.0)
BUN: 60 mg/dL — ABNORMAL HIGH (ref 6–20)
CALCIUM: 8.6 mg/dL — AB (ref 8.9–10.3)
CO2: 30 mmol/L (ref 22–32)
Chloride: 98 mmol/L — ABNORMAL LOW (ref 101–111)
Creatinine, Ser: 6.14 mg/dL — ABNORMAL HIGH (ref 0.44–1.00)
GFR calc non Af Amer: 6 mL/min — ABNORMAL LOW (ref >60)
GFR, EST AFRICAN AMERICAN: 7 mL/min — AB (ref >60)
Glucose, Bld: 138 mg/dL — ABNORMAL HIGH (ref 65–99)
PHOSPHORUS: 3.3 mg/dL (ref 2.5–4.6)
Potassium: 3.9 mmol/L (ref 3.5–5.1)
SODIUM: 140 mmol/L (ref 135–145)

## 2015-04-19 LAB — GLUCOSE, CAPILLARY
GLUCOSE-CAPILLARY: 167 mg/dL — AB (ref 65–99)
Glucose-Capillary: 129 mg/dL — ABNORMAL HIGH (ref 65–99)
Glucose-Capillary: 136 mg/dL — ABNORMAL HIGH (ref 65–99)
Glucose-Capillary: 251 mg/dL — ABNORMAL HIGH (ref 65–99)
Glucose-Capillary: 255 mg/dL — ABNORMAL HIGH (ref 65–99)
Glucose-Capillary: 263 mg/dL — ABNORMAL HIGH (ref 65–99)

## 2015-04-19 LAB — CBC
HCT: 32.4 % — ABNORMAL LOW (ref 35.0–47.0)
HEMOGLOBIN: 10.5 g/dL — AB (ref 12.0–16.0)
MCH: 31.2 pg (ref 26.0–34.0)
MCHC: 32.5 g/dL (ref 32.0–36.0)
MCV: 95.9 fL (ref 80.0–100.0)
Platelets: 193 10*3/uL (ref 150–440)
RBC: 3.37 MIL/uL — AB (ref 3.80–5.20)
RDW: 25 % — ABNORMAL HIGH (ref 11.5–14.5)
WBC: 4.6 10*3/uL (ref 3.6–11.0)

## 2015-04-19 NOTE — Progress Notes (Signed)
[facility] Physicians - [place] at [facility]   PATIENT NAME: [name]    MR#:  [id number]  DATE OF BIRTH:  1945-08-02   no new complaints.  CHIEF COMPLAINT:   Chief Complaint  Patient presents with  . Altered Mental Status   the patient is demented, no complaint REVIEW OF SYSTEMS:  Review of Systems  Unable to perform ROS: mental acuity   DRUG ALLERGIES:   Allergies  Allergen Reactions  . Contrast Media [Iodinated Diagnostic Agents] Anaphylaxis   VITALS:  Blood pressure 121/60, pulse 78, temperature 97.8 F (36.6 C), temperature source Oral, resp. rate 18, height  (1.626 m), weight 60.102 kg (132 lb 8 oz), SpO2 100 %. PHYSICAL EXAMINATION:  Physical Exam  Constitutional: She is well-developed, well-nourished, and in no distress.  HENT:  Head: Normocephalic and atraumatic.  Eyes: Conjunctivae and EOM are normal. Pupils are equal, round, and reactive to light.  Neck: Normal range of motion. Neck supple. No tracheal deviation present. No thyromegaly present.  Cardiovascular: Normal rate and regular rhythm.   Murmur heard.  Systolic murmur is present  Ejection systolic murmur present.  Pulmonary/Chest: Effort normal and breath sounds normal. No respiratory distress. She has no wheezes. She exhibits no tenderness.  Abdominal: Soft. Bowel sounds are normal. She exhibits no distension. There is no tenderness.  Musculoskeletal: Normal range of motion.  Neurological: She is alert. No cranial nerve deficit.  Unable to follow full neuro exam due to dementia,  Skin: Skin is warm and dry. No rash noted.  Psychiatric: Mood and affect normal.   LABORATORY PANEL:   CBC  Recent Labs Lab 04/19/15 0452  WBC 4.6  HGB 10.5*  HCT 32.4*  PLT 193   ------------------------------------------------------------------------------------------------------------------ Chemistries   Recent Labs Lab 04/13/15 1929  04/19/15 0452  NA 138  < > 140  K 3.5  < >  3.9  CL 92*  < > 98*  CO2 32  < > 30  GLUCOSE 140*  < > 138*  BUN 38*  < > 60*  CREATININE 4.22*  < > 6.14*  CALCIUM 9.0  < > 8.6*  AST 31  --   --   ALT 24  --   --   ALKPHOS 81  --   --   BILITOT 0.7  --   --   < > = values in this interval not displayed. RADIOLOGY:  No results found. ASSESSMENT AND PLAN:  * Acute encephalopathy - likely due to seizure resolved and now she is back to baseline.   * Seizures (HCC) -Keppra dose is adjusted.had EEG,results pending Continue Keppra.  * Acute on chronic systolic CHF (congestive heart failure) (HCC) -on hemodialysis as per nephrology.   Has severe AS - NSTEMI due to AS;heparin drip received. On ASA,plavix and high intensity statins.  -- Without aortic valve surgery,patient's symptoms will continue to progress, worsening cardiac failure, shortness of breath, troponin elevations, near-syncopal syncope, difficulty with hemodialysis . Appreciate  palliative care team following the patient.   * Elevated troponin - likely secondary to ischemia in the setting of coronary artery disease, ischemic heart myopathy, exacerbated by severe aortic valve stenosis. * End stage renal disease on dialysis [facility]) - nephrology consult for hemodialysis support,  * Type 2 diabetes mellitus with other circulatory complications (HCC) - sliding scale insulin and corresponding glucose checks   * CAD (coronary artery disease) - continue home meds, seen by cardio  * Hyperlipidemia - continue home dose statin Hypertension ;  controlled.  deConditioning, physical therapy commended SNF placement.   prognosis Is poor secondary to progressive  severe aortic stenosis, dementia, ESRD on hemodialysis. He Seen by palliative care team, plan to have discussion with family today, patient is possible eligible for hospice home. Follow-up with Dr. Orvan Falconer.  I discussed with Dr. Thedore Mins. All the records are reviewed and case discussed with Care Management/Social  Worker. Management plans discussed with the patient, family and they are in agreement.  CODE STATUS: FULL CODE  TOTAL TIME TAKING CARE OF THIS PATIENT: 36 minutes.  Greater than 50% time was spent on coordination of care and face-to-face counseling.   POSSIBLE D/C IN 1-2 DAYS, DEPENDING ON CLINICAL CONDITION.   Shaune Pollack M.D on 04/19/2015 at 4:19 PM  Between 7am to 6pm - Pager - [phone]  After 6pm go to [email or web address] - password [email or web address] [facility]  [place] Hospital[facility]  Office  [phone]  CC: Primary care physician; Amy L Krebs, NP  Note: This dictation was prepared with Dragon dictation along with smaller phrase technology. Any transcriptional errors that result from this process are unintentional.

## 2015-04-19 NOTE — Progress Notes (Signed)
PT Cancellation Note  Patient Details Name: [name] MRN: [id number] DOB: 02-Jul-1945   Cancelled Treatment:    Reason Eval/Treat Not Completed: Patient at procedure or test/unavailable. Patient is currently off the floor for dialysis treatment. PT will continue to follow and attempt mobility sessions as patient is available and appropriate.   Kerin Ransom, PT, DPT    04/19/2015, 10:55 AM

## 2015-04-19 NOTE — Progress Notes (Signed)
[facility]  ROUNDING NOTE   Subjective:   Mental status at baseline.  Patient seen during dialysis Tolerating well    Objective:  Vital signs in last 24 hours:  Temp:  [97.5 F (36.4 C)-98.4 F (36.9 C)] 97.5 F (36.4 C) (01/30 0922) Pulse Rate:  [73-79] 73 (01/30 1130) Resp:  [16-22] 22 (01/30 1100) BP: (100-109)/(58-63) 109/62 mmHg (01/30 0930) SpO2:  [100 %] 100 % (01/30 0553) Weight:  [61.054 kg (134 lb 9.6 oz)] 61.054 kg (134 lb 9.6 oz) (01/30 0500)  Weight change: 0.091 kg (3.2 oz) Filed Weights   04/17/15 0553 04/18/15 0541 04/19/15 0500  Weight: 58.922 kg (129 lb 14.4 oz) 60.963 kg (134 lb 6.4 oz) 61.054 kg (134 lb 9.6 oz)    Intake/Output: I/O last 3 completed shifts: In: 260 [P.O.:260] Out: 0    Intake/Output this shift:     Physical Exam: General: NAD, laying in bed  Head: Normocephalic, atraumatic. Moist oral mucosal membranes  Eyes: Anicteric,    Neck: Supple, trachea midline  Lungs:  Clear to auscultation  Heart: Regular rate and rhythm, +murmur  Abdomen:  Soft, nontender,   Extremities: no peripheral edema.  Neurologic: Alert to self only  Skin: No lesions  Access: Left arm AVG    Basic Metabolic Panel:  Recent Labs Lab 04/13/15 0748 04/13/15 1929 04/14/15 0914 04/16/15 0846 04/19/15 0452  NA 136 138 137 136 140  K 3.8 3.5 3.7 3.6 3.9  CL 92* 92* 91* 94* 98*  CO2 27 32 GLUCOSE 244* 140* 103* 117* 138*  BUN 34* 38* 43* 38* 60*  CREATININE 3.92* 4.22* 4.79* 4.67* 6.14*  CALCIUM 8.8* 9.0 9.0 8.6* 8.6*  PHOS  --   --  4.0 3.6 3.3    Liver Function Tests:  Recent Labs Lab 04/13/15 0128 04/13/15 1929 04/14/15 0914 04/16/15 0846 04/19/15 0452  AST 28 31  --   --   --   ALT 19 24  --   --   --   ALKPHOS 93 81  --   --   --   BILITOT 0.8 0.7  --   --   --   PROT 6.4* 6.4*  --   --   --   ALBUMIN 3.3* 3.4* 3.0* 3.0* 2.8*   No results for input(s): LIPASE, AMYLASE in the last 168 hours. No results for  input(s): AMMONIA in the last 168 hours.  CBC:  Recent Labs Lab 04/13/15 0128 04/13/15 0748 04/14/15 0914 04/16/15 0845 04/19/15 0452  WBC 6.4 8.5 6.7 5.2 4.6  NEUTROABS 5.1  --   --   --   --   HGB 10.6* 11.3* 10.5* 10.7* 10.5*  HCT 32.6* 34.5* 32.4* 32.4* 32.4*  MCV 95.1 96.0 94.4 95.2 95.9  PLT 125* 123* 157 199 193    Cardiac Enzymes:  Recent Labs Lab 04/13/15 0128 04/13/15 0748 04/13/15 1248 04/13/15 1929  TROPONINI 0.12* 1.05* 3.44* 7.70*    BNP: Invalid input(s): POCBNP  CBG:  Recent Labs Lab 04/18/15 1228 04/18/15 1257 04/18/15 1647 04/19/15 0008 04/19/15 0555  GLUCAP 272* 245* 190* 167* 136*    Microbiology: Results for orders placed or performed during the hospital encounter of 04/13/15  Culture, blood (routine x 2)     Status: None   Collection Time: 04/13/15  2:26 AM  Result Value Ref Range Status   Specimen Description BLOOD BLOOD RIGHT FOREARM  Final   Special Requests BOTTLES DRAWN AEROBIC AND ANAEROBIC   Final   Culture NO GROWTH 5 DAYS  Final   Report Status 04/18/2015 FINAL  Final  Culture, blood (routine x 2)     Status: None   Collection Time: 04/13/15  2:50 AM  Result Value Ref Range Status   Specimen Description BLOOD RIGHT HAND  Final   Special Requests BOTTLES DRAWN AEROBIC AND ANAEROBIC  Final   Culture NO GROWTH 5 DAYS  Final   Report Status 04/18/2015 FINAL  Final  MRSA PCR Screening     Status: None   Collection Time: 04/13/15  6:18 AM  Result Value Ref Range Status   MRSA by PCR NEGATIVE NEGATIVE Final    Comment:        The GeneXpert MRSA Assay (FDA approved for NASAL specimens only), is one component of a comprehensive MRSA colonization surveillance program. It is not intended to diagnose MRSA infection nor to guide or monitor treatment for MRSA infections.     Coagulation Studies: No results for input(s): LABPROT, INR in the last 72 hours.  Urinalysis: No results for input(s): COLORURINE,  LABSPEC, PHURINE, GLUCOSEU, HGBUR, BILIRUBINUR, KETONESUR, PROTEINUR, UROBILINOGEN, NITRITE, LEUKOCYTESUR in the last 72 hours.  Invalid input(s): APPERANCEUR    Imaging: No results found.   Medications:     . aspirin EC  81 mg Oral Daily  . atorvastatin  80 mg Oral Daily  . calcium acetate  667 mg Oral TID WC  . clopidogrel  75 mg Oral Daily  . feeding supplement (NEPRO CARB STEADY)  237 mL Oral BID BM  . insulin aspart  0-9 Units Subcutaneous Q6H  . levETIRAcetam  500 mg Oral Q M,W,F-2000  . levETIRAcetam  750 mg Oral Q24H  . sodium chloride  3 mL Intravenous Q12H   ondansetron **OR** ondansetron (ZOFRAN) IV  Assessment/ Plan:  [name] is a 70 y.o. black female with end-stage renal disease, myocardial infarction, CVA, hyperlipidemia, hypertension, diabetes mellitus, depression, secondary hyperparathyroidism, dementia, GERD, anemia chronic kidney disease   [facility] MWF [facility] [address]   1. End-stage renal disease on hemodialysis. Through AVG - Continue MWF schedule.   2. Anemia chronic kidney disease. 10.5 - Continue to hold Epogen due to ischemic event.  3. Secondary hyperparathyroidism: phos 3.3  - at goal.  - Continue calcium acetate  4. Hypertension: blood pressure at goal.  - holding metoprolol, isosorbide dinitrate, hydralazine  5. Diabetes mellitus type II with chronic kidney disease  - Continue glucose control. SSI   LOS: 6 [name] 1/30/201712:16 PM

## 2015-04-19 NOTE — Care Management Important Message (Signed)
Important Message  Patient Details  Name: [name] MRN: [id number] Date of Birth: 06-11-1945   Medicare Important Message Given:  Yes    [name] [name] 04/19/2015, 10:11 AM

## 2015-04-20 DIAGNOSIS — E785 Hyperlipidemia, unspecified: Secondary | ICD-10-CM

## 2015-04-20 DIAGNOSIS — G8191 Hemiplegia, unspecified affecting right dominant side: Secondary | ICD-10-CM

## 2015-04-20 DIAGNOSIS — Z8673 Personal history of transient ischemic attack (TIA), and cerebral infarction without residual deficits: Secondary | ICD-10-CM

## 2015-04-20 LAB — GLUCOSE, CAPILLARY
GLUCOSE-CAPILLARY: 290 mg/dL — AB (ref 65–99)
GLUCOSE-CAPILLARY: 241 mg/dL — AB (ref 65–99)
Glucose-Capillary: 335 mg/dL — ABNORMAL HIGH (ref 65–99)

## 2015-04-20 MED ORDER — ATORVASTATIN CALCIUM 80 MG PO TABS: 80.0000 mg | ORAL_TABLET | Freq: Every day | ORAL | Status: DC

## 2015-04-20 MED ORDER — LEVETIRACETAM 500 MG PO TABS
500.0000 mg | ORAL_TABLET | ORAL | Status: DC
Start: 2015-04-20 — End: 2015-05-29

## 2015-04-20 MED ORDER — LEVETIRACETAM 750 MG PO TABS: 750.0000 mg | ORAL_TABLET | ORAL | Status: DC

## 2015-04-20 NOTE — Progress Notes (Signed)
[facility] Physicians - [place] at [facility]   PATIENT NAME: [name]    MR#:  [id number]  DATE OF BIRTH:  07/14/1945   no new complaints.  CHIEF COMPLAINT:   Chief Complaint  Patient presents with  . Altered Mental Status   the patient is demented, no complaint REVIEW OF SYSTEMS:  Review of Systems  Unable to perform ROS: mental acuity   DRUG ALLERGIES:   Allergies  Allergen Reactions  . Contrast Media [Iodinated Diagnostic Agents] Anaphylaxis   VITALS:  Blood pressure 121/74, pulse 92, temperature 98.5 F (36.9 C), temperature source Oral, resp. rate 20, height  (1.626 m), weight 61.553 kg (135 lb 11.2 oz), SpO2 92 %. PHYSICAL EXAMINATION:  Physical Exam  Constitutional: She is well-developed, well-nourished, and in no distress.  HENT:  Head: Normocephalic and atraumatic.  Eyes: Conjunctivae and EOM are normal. Pupils are equal, round, and reactive to light.  Neck: Normal range of motion. Neck supple. No tracheal deviation present. No thyromegaly present.  Cardiovascular: Normal rate and regular rhythm.   Murmur heard.  Systolic murmur is present  Ejection systolic murmur present.  Pulmonary/Chest: Effort normal and breath sounds normal. No respiratory distress. She has no wheezes. She exhibits no tenderness.  Abdominal: Soft. Bowel sounds are normal. She exhibits no distension. There is no tenderness.  Musculoskeletal: Normal range of motion.  Neurological: She is alert. No cranial nerve deficit.  Unable to follow full neuro exam due to dementia,  Skin: Skin is warm and dry. No rash noted.  Psychiatric: Mood and affect normal.   LABORATORY PANEL:   CBC  Recent Labs Lab 04/19/15 0452  WBC 4.6  HGB 10.5*  HCT 32.4*  PLT 193   ------------------------------------------------------------------------------------------------------------------ Chemistries   Recent Labs Lab 04/13/15 1929  04/19/15 0452  NA 138  < > 140  K 3.5  < >  3.9  CL 92*  < > 98*  CO2 32  < > 30  GLUCOSE 140*  < > 138*  BUN 38*  < > 60*  CREATININE 4.22*  < > 6.14*  CALCIUM 9.0  < > 8.6*  AST 31  --   --   ALT 24  --   --   ALKPHOS 81  --   --   BILITOT 0.7  --   --   < > = values in this interval not displayed. RADIOLOGY:  No results found. ASSESSMENT AND PLAN:  * Acute encephalopathy - likely due to seizure resolved and now she is back to baseline.   * Seizures (HCC) -Keppra dose is adjusted.had EEG,results pending Continue Keppra.  * Acute on chronic systolic CHF (congestive heart failure) (HCC) -on hemodialysis as per nephrology.   Has severe AS - NSTEMI due to AS;heparin drip received. On ASA,plavix and high intensity statins.  -- Without aortic valve surgery,patient's symptoms will continue to progress, worsening cardiac failure, shortness of breath, troponin elevations, near-syncopal syncope, difficulty with hemodialysis . Appreciate  palliative care team following the patient.   * Elevated troponin - likely secondary to ischemia in the setting of coronary artery disease, ischemic heart myopathy, exacerbated by severe aortic valve stenosis. * End stage renal disease on dialysis [facility]) - nephrology consult for hemodialysis support,  * Type 2 diabetes mellitus with other circulatory complications (HCC) - sliding scale insulin and corresponding glucose checks   * CAD (coronary artery disease) - continue home meds, seen by cardio  * Hyperlipidemia - continue home dose statin Hypertension ;  controlled.  deConditioning, physical therapy commended SNF placement.   prognosis Is poor secondary to progressive  severe aortic stenosis, dementia, ESRD on hemodialysis.  Per Dr. Orvan Falconer, the patient daughter wants the patient placed in SNF.  I discussed with Dr. Dr. Orvan Falconer. All the records are reviewed and case discussed with Care Management/Social Worker. Management plans discussed with the patient, family and they are in  agreement.  CODE STATUS: FULL CODE  TOTAL TIME TAKING CARE OF THIS PATIENT: 36 minutes.  Greater than 50% time was spent on coordination of care and face-to-face counseling.   POSSIBLE D/C IN 1 DAYS, DEPENDING ON CLINICAL CONDITION.   Shaune Pollack M.D on 04/20/2015 at 4:06 PM  Between 7am to 6pm - Pager - [phone]  After 6pm go to [email or web address] - password [email or web address] [facility]  [place] [facility]  Office  [phone]  CC: Primary care physician; Amy L Krebs, NP  Note: This dictation was prepared with Dragon dictation along with smaller phrase technology. Any transcriptional errors that result from this process are unintentional.

## 2015-04-20 NOTE — Progress Notes (Signed)
Physical Therapy Treatment Patient Details Name: [name] MRN: [id number] DOB: Nov 29, 1945 Today's Date: 04/20/2015    History of Present Illness Pt is a 70 yo female with acute encephalopathy and on HD, has recent seizure-like activity in HD and came to ED.  Hx:  stroke, MI, HLD, HTN, DM, CAD, advanced dementia    PT Comments    PT was called by care management to re-assess pt for discharge recommendations.  Pt participating some with therapist but improved participation noted with involvement of pt's daughter during session.  Extended discussion with pt's daughter regarding disposition during session.  [name] would like pt to discharge to STR to improve strength and functional mobility to decrease assist levels at home (pt also needs to transfer to go to OP dialysis).  [name] agreeable to coming to every therapy session at STR to assist with facilitating pt's participation in therapy for optimum functional outcome.  Discussed this with SW and that if pt's daughter comes to every therapy session for best pt participation at [facility], pt would benefit from STR trial (with focus on bed mobility and transfers required for participation in OP dialysis; progressive gait training as appropriate).   Follow Up Recommendations  SNF     Equipment Recommendations   (TBD)    Recommendations for Other Services       Precautions / Restrictions Precautions Precautions: Fall Precaution Comments: Seizure; Aspiration Restrictions Weight Bearing Restrictions: No    Mobility  Bed Mobility Overal bed mobility: Needs Assistance Bed Mobility: Supine to Sit;Sit to Supine     Supine to sit: Max assist Sit to supine: Max assist   General bed mobility comments: pt assisting some with LE movement with significant cueing; assist for trunk and B LE's  Transfers Overall transfer level: Needs assistance Equipment used: Rolling walker (2 wheeled);None Transfers: Sit to/from Stand Sit to  Stand: Max assist         General transfer comment: trialed sit to stand with RW initially but pt with minimal participation; pt's daughter requesting to try without RW (as she has done previously) and able to stand pt but pt's knees begain to buckle requiring assist from therapist to sit back down onto edge of bed safely; PT trialed 1 stand without AD (B knees blocked) and able to stand pt with max assist and extra cueing to pt for assist  Ambulation/Gait             General Gait Details: not appropriate at this time   Stairs            Wheelchair Mobility    Modified Rankin (Stroke Patients Only)       Balance Overall balance assessment: Needs assistance Sitting-balance support: Bilateral upper extremity supported;Feet supported Sitting balance-Leahy Scale: Poor Sitting balance - Comments: Initially alternating between anterior or posterior lean with mod assist for upright but after trials with standing pt SBA to CGA with sitting on edge of bed x10 minutes with vc's for upright posture intermittently (d/t anterior or posterior lean)                            Cognition Arousal/Alertness:  (Initially sleepy but woke/alert with activity) Behavior During Therapy: Flat affect Overall Cognitive Status: History of cognitive impairments - at baseline       Memory: Decreased recall of precautions;Decreased short-term memory              Exercises  General Comments   Nursing cleared pt for participation in physical therapy.  Pt agreeable to PT session. [name] present for entire session.      Pertinent Vitals/Pain Pain Assessment: Faces Faces Pain Scale: No hurt    Home Living                      Prior Function            PT Goals (current goals can now be found in the care plan section) Acute Rehab PT Goals Patient Stated Goal: to get stronger and be able to walk again PT Goal Formulation: With family Time For Goal  Achievement: 05/04/15 Progress towards PT goals: Progressing toward goals    Frequency  Min 2X/week    PT Plan Discharge plan needs to be updated    Co-evaluation             End of Session Equipment Utilized During Treatment: Gait belt Activity Tolerance: Patient limited by fatigue Patient left: in bed;with call bell/phone within reach;with bed alarm set;with family/visitor present;with SCD's reapplied (Heel protectors in place (with heels elevated))     Time: 1407-1500 PT Time Calculation (min) (ACUTE ONLY): 53 min  Charges:  $Therapeutic Activity: 53-67 mins                    G CodesHendricks Limes April 22, 2015, 3:50 PM Hendricks Limes, PT [phone]

## 2015-04-20 NOTE — Progress Notes (Signed)
Inpatient Diabetes Program Recommendations  AACE/ADA: New Consensus Statement on Inpatient Glycemic Control (2015)  Target Ranges:  Prepandial:   less than 140 mg/dL      Peak postprandial:   less than 180 mg/dL (1-2 hours)      Critically ill patients:  140 - 180 mg/dL  Results for LAYSHA, CHILDERS (MRN [id number]) as of 04/20/2015 12:35  Ref. Range 04/19/2015 05:55 04/19/2015 13:11 04/19/2015 16:28 04/19/2015 23:46 04/20/2015 05:51 04/20/2015 11:46  Glucose-Capillary Latest Ref Range: 65-99 mg/dL 213 (H) 086 (H) 578 (H) 263 (H) 290 (H) 335 (H)   Review of Glycemic Control  Current orders for Inpatient glycemic control: Novolog 0-9 units Q6H  Inpatient Diabetes Program Recommendations: Insulin - Basal: May want to consider ordering Levemir 6 units Q24H (based on 61 kg x 0.1 units).  Thanks, Orlando Penner, RN, MSN, CDE Diabetes Coordinator Inpatient Diabetes Program [phone] (Team Pager from 8am to 5pm) [phone] ([facility] office) [phone] [facility] office) [phone] [facility] office)

## 2015-04-20 NOTE — Progress Notes (Signed)
Palliative Medicine Inpatient Consult Follow Up Note   Name: [name] Date: 04/20/2015 MRN: [id number]  DOB: 1946-01-23  Referring Physician: Shaune Pollack, MD  Palliative Care consult requested for this 70 y.o. female for goals of medical therapy in patient with altered mental status and a number of other medical conditions as outlined below. Pt is well known to me from pt's December hospital admission. At that time, the patient's daughter wanted pt to remain full code and she was hopeful pt would start being able to sit up for dialysis again --which pt did start doing prior to discharge. She was discharged to [facility] but she had been discharged home (after 20 days of STR) prior to this admission. Daughter has informed social worker that pt 'used to walk' before [facility] (that was also prior to pts cardiac arrest which resulted in last admission). She wants more aggressive therapy for pt after this hospital stay --per notes I have reviewed. Pt may not have many STR days left, however.    TODAY'S DISCUSSIONS AND DECISIONS: 1.  I had heard pt was going to go home with home health/ PT.  I then recommended [facility], since pt has less than a year life expectancy with her cardiac problems, malnutrition, and ESRD.    2.  However, when I called the daughter to run this by her and also to (once again) bring up code status, the daughter, [name], said she does not want pt at home --she really wants her in rehab (again).  Pt was DCd from [facility] for not progressing with PT.  Daughter wants pt to have another trial. She says pt was able to stand and transfer last week at home.  She doesn't agree with PT assessment recommending home health PT.  I let case mgr know about this and there will be another PT eval.  3.  I was able to bring up code status with daughter.  She said she would think about it. Pt remains full code still.    4.  IF PT GOES TO A REHAB FACILITY --PLEASE  HAVE THE MD PUT 'NEEDS A PALLIATIVE CARE CONSULT IN THE FACILITY' INTO THE DC SUMMARY.    I am signing off at this time, having advanced the palliative discussions as far as I am able to do at this time.  I am fairly certain that I will see pt back here again, given her many medical problems.    -------------------------------  IMPRESSION: Acute Encephalopathy ---thought to be due to a seizure --back to baseline ESRD --able to help with her own positioning somewhat  Acute hypoxic respiratory failure with CODE BLUE called  ---occurred 02/04/15 secondary to fistulogram procedure with angioedema Having occurred. 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  SEVERE AORTIC STENOSIS --per echo 11/21 Metabolic Encephalopathy --due  to illness/ anoxia? Moderate Malnutrition due to     CODE STATUS: Full code   PAST MEDICAL HISTORY: Past Medical History  Diagnosis Date  . Stroke (HCC)   . MI (myocardial infarction) (HCC)   . Hearing impaired   . Hyperlipidemia   . Hypertension   . Diabetes mellitus without complication (HCC)   . Depression   . Coronary artery disease   . Dementia   . Secondary hyperparathyroidism (HCC)   . Chronic systolic CHF  (congestive heart failure) (HCC)   . GERD (gastroesophageal reflux disease)   . Personal history of transient ischemic attack (TIA) and cerebral infarction without residual deficit   . Anemia   . Moderate aortic stenosis     a. echo 04/2014: 40-50%, moderate AS b. echo 01/2015: EF 35-40%, Severe AS, mild MR, moderate TR  . ESRD (end stage renal disease) (HCC)     a. Dialysis MWF    PAST SURGICAL HISTORY:  Past Surgical History  Procedure Laterality Date  . Cataract extraction, bilateral    . Wrist surgery    . Peripheral vascular catheterization Left 07/28/2014    Procedure: A/V Shuntogram/Fistulagram;  Surgeon: Renford Dills, MD;  Location: [facility] INVASIVE CV LAB;  Service: Cardiovascular;  Laterality: Left;  . Peripheral vascular catheterization Left 07/28/2014    Procedure: A/V Shunt Intervention;  Surgeon: Renford Dills, MD;  Location: [facility] INVASIVE CV LAB;  Service: Cardiovascular;  Laterality: Left;  . Peripheral vascular catheterization Left 02/04/2015    Procedure: A/V Shuntogram/Fistulagram;  Surgeon: Annice Needy, MD;  Location: [facility] INVASIVE CV LAB;  Service: Cardiovascular;  Laterality: Left;  . Peripheral vascular catheterization Left 02/04/2015    Procedure: A/V Shunt Intervention;  Surgeon: Annice Needy, MD;  Location: [facility] INVASIVE CV LAB;  Service: Cardiovascular;  Laterality: Left;    Vital Signs: BP 121/74 mmHg  Pulse 100  Temp(Src) 98.5 F (36.9 C) (Oral)  Resp 20  Ht  (1.626 m)  Wt 61.553 kg (135 lb 11.2 oz)  BMI 23.28 kg/m2  SpO2 96% Filed Weights   04/19/15 0500 04/19/15 1257 04/20/15 0542  Weight: 61.054 kg (134 lb 9.6 oz) 60.102 kg (132 lb 8 oz) 61.553 kg (135 lb 11.2 oz)    Estimated body mass index is 23.28 kg/(m^2) as calculated from the following:   Height as of this encounter:  (1.626 m).   Weight as of this encounter: 61.553 kg (135 lb 11.2 oz).  PHYSICAL EXAM: Resting in bed Eyes closed now No JVD or TM Hrt rrr no m Lungs cta   Abd soft and NT Ext no cyanosis and mottling  LABS: CBC:    Component Value Date/Time   WBC 4.6 04/19/2015 0452   WBC 4.1 04/24/2014 1037   HGB 10.5* 04/19/2015 0452   HGB 8.3* 04/24/2014 1037   HCT 32.4* 04/19/2015 0452   HCT 25.1* 04/24/2014 1037   PLT 193 04/19/2015 0452   PLT 123* 04/24/2014 1037   MCV 95.9 04/19/2015 0452   MCV 90 04/24/2014 1037   NEUTROABS 5.1 04/13/2015 0128   NEUTROABS 2.8 04/24/2014 1037   LYMPHSABS 0.6* 04/13/2015 0128   LYMPHSABS 0.7* 04/24/2014 1037   MONOABS 0.7 04/13/2015 0128   MONOABS 0.4 04/24/2014 1037   EOSABS 0.0 04/13/2015 0128   EOSABS 0.2 04/24/2014 1037   BASOSABS 0.0 04/13/2015 0128   BASOSABS 0.0 04/24/2014 1037   Comprehensive Metabolic Panel:    Component Value Date/Time   NA 140 04/19/2015 0452  NA 133* 04/24/2014 1037   K 3.9 04/19/2015 0452   K 4.5 04/24/2014 1037   CL 98* 04/19/2015 0452   CL 93* 04/24/2014 1037   CO2 30 04/19/2015 0452   CO2 30 04/24/2014 1037   BUN 60* 04/19/2015 0452   BUN 48* 04/24/2014 1037   CREATININE 6.14* 04/19/2015 0452   CREATININE 6.12* 04/24/2014 1037   GLUCOSE 138* 04/19/2015 0452   GLUCOSE 229* 04/24/2014 1037   CALCIUM 8.6* 04/19/2015 0452   CALCIUM 8.1* 04/24/2014 1037   AST 31 04/13/2015 1929   AST 79* 07/16/2013 1750   ALT 24 04/13/2015 1929   ALT 24 07/16/2013 1750   ALKPHOS 81 04/13/2015 1929   ALKPHOS 107 07/16/2013 1750   BILITOT 0.7 04/13/2015 1929   BILITOT 0.4 07/16/2013 1750   PROT 6.4* 04/13/2015 1929   PROT 8.1 07/16/2013 1750   ALBUMIN 2.8* 04/19/2015 0452   ALBUMIN 2.7* 04/24/2014 1037     More than 50% of the visit was spent in counseling/coordination of care: YES  Time Spent: 25 min

## 2015-04-20 NOTE — Care Management (Signed)
Received call from patient's daughter - message left that "she was very upset that no one talked to me about my mother going home today". I called her back and explained that I would investigate and talk to CSW (she said that she had talked to a Child psychotherapist that "sounded black" so) she had talked to someone about discharge today. It was [place] CSW and they discussed PT recommending HHPT and 24/7 care which daughter states patient does not have. CSW working with PT to see if patient meets SNF needs. Daughter states that "she can walk". Patient has history of going to [facility] for SNF but was not able to participate with PT. RNCM to follow for home health needs.

## 2015-04-20 NOTE — Care Management (Signed)
Patient is for discharge home today with home health.  Dr Orvan Falconer says that patient would have a less than one year life expectancy due to her severe aortic stenosis and esrd.  have left a message for patient's daughter to discuss agency choice for home health.

## 2015-04-20 NOTE — Progress Notes (Signed)
CSW spoke to patient's daughter/ Legal Guardian- Elenor over the phone. Per Elenor she is displeased with service she's received from [facility]. She stated that she was not informed with PT's recommendations for patient. She stated she wanted to appeal patient's discharge and request another PT consult. CSW contacted PT Raquel Sarna). She reported that she would round on patient today.   2:00 PM: CSW was stopped in the hall by patient's Legal Guardian- Elenor. She explained that she wanted to be in the room while PT is evaluating patient. CSW informed PT Raquel Sarna) of patient's daughter/ Legal Guardian's request.   3:05PM: CSW was informed by Raquel Sarna that patient would meet the qualifications for SNF. She reports that patient's daughter has requested to participate in each PT session at SNF. Per Raquel Sarna she reports that patient should first focus on transfers. CSW informed Raquel Sarna that she would reiterate these concerns with patient's daughter.   3:10PM: CSW met with patient's daughter Dalbert Garnet in the hall way. CSW informed patient's daughter of bed offers ([place] and [facility]). Patient's daughter chose 52. CSW was informed by Elenor that patient's dialysis time is 12 PM MWF at [facility]. CSW will inform WOM to ensure transportation is arranged .  3:30PM: CSW spoke to Neoma Laming, Development worker, international aid at [facility] to inform her that patient accepted the bed offer. She requested patient's dialysis chair time. CSW informed that patient's chair time is MWF at [facility]. She reports that she can arrange transportation to patient for dialysis . Per Neoma Laming a bed will be available for patient tomorrow. CSW contacted MD Bridgett Larsson to inform him of above. CSW contacted Elenor and left a message to inform her of above. CSW will continue to follow and assist.   Ernest Pine, MSW, [place] Work Department [phone]

## 2015-04-20 NOTE — Progress Notes (Signed)
[facility]  ROUNDING NOTE   Subjective:   Mental status at baseline.  Patient denies any complaints No acute events   Objective:  Vital signs in last 24 hours:  Temp:  [97.8 F (36.6 C)-99.2 F (37.3 C)] 98.5 F (36.9 C) (01/31 1134) Pulse Rate:  [78-100] 100 (01/31 1134) Resp:  [17-20] 20 (01/31 1134) BP: (99-121)/(53-74) 121/74 mmHg (01/31 1134) SpO2:  [92 %-100 %] 96 % (01/31 1134) Weight:  [60.102 kg (132 lb 8 oz)-61.553 kg (135 lb 11.2 oz)] 61.553 kg (135 lb 11.2 oz) (01/31 0542)  Weight change: -0.953 kg (-2 lb 1.6 oz) Filed Weights   04/19/15 0500 04/19/15 1257 04/20/15 0542  Weight: 61.054 kg (134 lb 9.6 oz) 60.102 kg (132 lb 8 oz) 61.553 kg (135 lb 11.2 oz)    Intake/Output: I/O last 3 completed shifts: In: 240 [P.O.:240] Out: 1002 [Other:1000; Stool:2]   Intake/Output this shift:     Physical Exam: General: NAD, laying in bed  Head: Normocephalic, atraumatic. Moist oral mucosal membranes  Eyes: Anicteric,    Neck: Supple, trachea midline  Lungs:  Clear to auscultation  Heart: Regular rate and rhythm, +murmur  Abdomen:  Soft, nontender,   Extremities: no peripheral edema.  Neurologic: Alert to self only  Skin: No lesions  Access: Left arm AVG    Basic Metabolic Panel:  Recent Labs Lab 04/13/15 1929 04/14/15 0914 04/16/15 0846 04/19/15 0452  NA 138 137 136 140  K 3.5 3.7 3.6 3.9  CL 92* 91* 94* 98*  CO2 32 GLUCOSE 140* 103* 117* 138*  BUN 38* 43* 38* 60*  CREATININE 4.22* 4.79* 4.67* 6.14*  CALCIUM 9.0 9.0 8.6* 8.6*  PHOS  --  4.0 3.6 3.3    Liver Function Tests:  Recent Labs Lab 04/13/15 1929 04/14/15 0914 04/16/15 0846 04/19/15 0452  AST 31  --   --   --   ALT 24  --   --   --   ALKPHOS 81  --   --   --   BILITOT 0.7  --   --   --   PROT 6.4*  --   --   --   ALBUMIN 3.4* 3.0* 3.0* 2.8*   No results for input(s): LIPASE, AMYLASE in the last 168 hours. No results for input(s): AMMONIA in the last 168  hours.  CBC:  Recent Labs Lab 04/14/15 0914 04/16/15 0845 04/19/15 0452  WBC 6.7 5.2 4.6  HGB 10.5* 10.7* 10.5*  HCT 32.4* 32.4* 32.4*  MCV 94.4 95.2 95.9  PLT 157 199 193    Cardiac Enzymes:  Recent Labs Lab 04/13/15 1248 04/13/15 1929  TROPONINI 3.44* 7.70*    BNP: Invalid input(s): POCBNP  CBG:  Recent Labs Lab 04/19/15 1311 04/19/15 1628 04/19/15 2346 04/20/15 0551 04/20/15 1146  GLUCAP 129* 251* 263* 290* 335*    Microbiology: Results for orders placed or performed during the hospital encounter of 04/13/15  Culture, blood (routine x 2)     Status: None   Collection Time: 04/13/15  2:26 AM  Result Value Ref Range Status   Specimen Description BLOOD BLOOD RIGHT FOREARM  Final   Special Requests BOTTLES DRAWN AEROBIC AND ANAEROBIC  Final   Culture NO GROWTH 5 DAYS  Final   Report Status 04/18/2015 FINAL  Final  Culture, blood (routine x 2)     Status: None   Collection Time: 04/13/15  2:50 AM  Result Value Ref Range Status  Specimen Description BLOOD RIGHT HAND  Final   Special Requests BOTTLES DRAWN AEROBIC AND ANAEROBIC  Final   Culture NO GROWTH 5 DAYS  Final   Report Status 04/18/2015 FINAL  Final  MRSA PCR Screening     Status: None   Collection Time: 04/13/15  6:18 AM  Result Value Ref Range Status   MRSA by PCR NEGATIVE NEGATIVE Final    Comment:        The GeneXpert MRSA Assay (FDA approved for NASAL specimens only), is one component of a comprehensive MRSA colonization surveillance program. It is not intended to diagnose MRSA infection nor to guide or monitor treatment for MRSA infections.     Coagulation Studies: No results for input(s): LABPROT, INR in the last 72 hours.  Urinalysis: No results for input(s): COLORURINE, LABSPEC, PHURINE, GLUCOSEU, HGBUR, BILIRUBINUR, KETONESUR, PROTEINUR, UROBILINOGEN, NITRITE, LEUKOCYTESUR in the last 72 hours.  Invalid input(s): APPERANCEUR    Imaging: No results  found.   Medications:     . aspirin EC  81 mg Oral Daily  . atorvastatin  80 mg Oral Daily  . calcium acetate  667 mg Oral TID WC  . clopidogrel  75 mg Oral Daily  . feeding supplement (NEPRO CARB STEADY)  237 mL Oral BID BM  . insulin aspart  0-9 Units Subcutaneous Q6H  . levETIRAcetam  500 mg Oral Q M,W,F-2000  . levETIRAcetam  750 mg Oral Q24H  . sodium chloride  3 mL Intravenous Q12H   ondansetron **OR** ondansetron (ZOFRAN) IV  Assessment/ Plan:  Ms. Laporshia Hogen is a 70 y.o. black female with end-stage renal disease, myocardial infarction, CVA, hyperlipidemia, hypertension, diabetes mellitus, depression, secondary hyperparathyroidism, dementia, GERD, anemia chronic kidney disease   [facility] MWF [facility] [address]   1. End-stage renal disease on hemodialysis. Through AVG - Continue MWF schedule.   2. Anemia chronic kidney disease. 10.5 - Continue to hold Epogen short-term due to ischemic event.  3. Secondary hyperparathyroidism: phos 3.3  - at goal.  - Continue calcium acetate  4. Acute encephalopathy is believed to be secondary to seizure and is now resolved.   LOS: 7 [name] 1/31/201712:46 PM

## 2015-04-20 NOTE — Progress Notes (Signed)
Per MD Imogene Burn change diet to Renal.

## 2015-04-21 LAB — GLUCOSE, CAPILLARY
Glucose-Capillary: 158 mg/dL — ABNORMAL HIGH (ref 65–99)
Glucose-Capillary: 267 mg/dL — ABNORMAL HIGH (ref 65–99)

## 2015-04-21 NOTE — Progress Notes (Addendum)
CSW called patient's daughter/ Legal Gus Puma [phone] April 20, 2015 at 3:55 PM and 4:17 PM. Left a voicemail. CSW is awaiting phone call back.  Woodroe Mode, MSW, [name]-A Clinical Social Work Department [phone]

## 2015-04-21 NOTE — Clinical Social Work Placement (Signed)
   CLINICAL SOCIAL WORK PLACEMENT  NOTE  Date:  04/21/2015  Patient Details  Name: [name] MRN: [id number] Date of Birth: 09-03-1945  Clinical Social Work is seeking post-discharge placement for this patient at the Skilled  Nursing Facility level of care (*CSW will initial, date and re-position this form in  chart as items are completed):  No   Patient/family provided with [place] Clinical Social Work Department's list of facilities offering this level of care within the geographic area requested by the patient (or if unable, by the patient's family).  Yes   Patient/family informed of their freedom to choose among providers that offer the needed level of care, that participate in Medicare, Medicaid or managed care program needed by the patient, have an available bed and are willing to accept the patient.  Yes   Patient/family informed of 's ownership interest in [facility] and [facility], as well as of the fact that they are under no obligation to receive care at these facilities.  PASRR submitted to EDS on       PASRR number received on       Existing PASRR number confirmed on 04/16/15     FL2 transmitted to all facilities in geographic area requested by pt/family on 04/16/15     FL2 transmitted to all facilities within larger geographic area on       Patient informed that his/her managed care company has contracts with or will negotiate with certain facilities, including the following:        Yes   Patient/family informed of bed offers received.  Patient chooses bed at  [facility] )     Physician recommends and patient chooses bed at      Patient to be transferred to  [facility] ) on 04/21/15.  Patient to be transferred to facility by  [facility] )     Patient family notified on 04/21/15 of transfer.  Name of family member notified:   Randye Lobo- Daughter/ Legal Guardian)     PHYSICIAN       Additional Comment:     _______________________________________________ Idamae Lusher, [name] 04/21/2015, 9:39 AM

## 2015-04-21 NOTE — Progress Notes (Signed)
Patient seen during dialysis Tolerating well     HEMODIALYSIS FLOWSHEET:  Blood Flow Rate (mL/min): 400 mL/min Arterial Pressure (mmHg): -190 mmHg Venous Pressure (mmHg): 210 mmHg Transmembrane Pressure (mmHg): 40 mmHg Ultrafiltration Rate (mL/min): 430 mL/min Dialysate Flow Rate (mL/min): 800 ml/min Conductivity: Machine : 13.9 Conductivity: Machine : 13.9 Dialysis Fluid Bolus: Normal Saline Bolus Amount (mL): 250 mL Dialysate Change: Other (comment) (pt to hd via bed, no c/o) Intra-Hemodialysis Comments: 1500 ml removed. TX ended, pt rinseback   BP 104/62 mmHg  Pulse 76  Temp(Src) 98.1 F (36.7 C) (Oral)  Resp 20  Ht  (1.626 m)  Wt 61.9 kg (136 lb 7.4 oz)  BMI 23.41 kg/m2  SpO2 100%   Resting quietly Normal resp effort Trace edema  A:  ESRD Plan: Continue Dialysis treatments as outpatient as patient is expected to be dc today

## 2015-04-21 NOTE — Discharge Instructions (Signed)
Renal and ADA diet. Activity as tolerated. Fall, aspiration and seizure precaution.

## 2015-04-21 NOTE — Progress Notes (Signed)
CSW contacted WOM and received room # 301 and report [id number] (C-Wing). Per Gavin Pound, admissions coordinator at [facility] she needs patient daughter/ Legal Guardian Randye Lobo [phone] to complete paperwork. Gavin Pound reports that she has an appointment to complete facility paperwork.   CSW called patient's daughter/ Legal Gus Puma [phone] at 9:38 AM, 12:59 PM and 1:01 PM. CSW spoke to [place] at 1:01 PM. She reports that she is at [facility] completing paperwork for admissions. She agreed for patient to go to [facility] at discharge. She granted verbal permission for patient to be transported via EMS. CSW will continue to follow and assist.   Woodroe Mode, MSW, [name]-A Clinical Social Work Department [phone]

## 2015-04-21 NOTE — Progress Notes (Signed)
Inpatient Diabetes Program Recommendations  AACE/ADA: New Consensus Statement on Inpatient Glycemic Control (2015)  Target Ranges:  Prepandial:   less than 140 mg/dL      Peak postprandial:   less than 180 mg/dL (1-2 hours)      Critically ill patients:  140 - 180 mg/dL  Results for AKIAH, BAUCH (MRN [id number]) as of 04/21/2015 10:35  Ref. Range 04/20/2015 05:51 04/20/2015 11:46 04/20/2015 15:44 04/21/2015 00:02  Glucose-Capillary Latest Ref Range: 65-99 mg/dL 253 (H) 664 (H) 403 (H) 267 (H)   Review of Glycemic Control  Current orders for Inpatient glycemic control: Novolog 0-9 units Q6H  Inpatient Diabetes Program Recommendations: Insulin - Basal: Patient received a total of Novolog 20 units on 04/20/15 for correction. Please consider ordering Levemir 6 units Q24H starting now (based on 60 kg x 0.1 units).  Thanks, Orlando Penner, RN, MSN, CDE Diabetes Coordinator Inpatient Diabetes Program [phone] (Team Pager from 8am to 5pm) [phone] ([facility] office) [phone] [facility] office) [phone] [facility] office)

## 2015-04-21 NOTE — Progress Notes (Signed)
Clinical Social Worker was informed that patient will be medically ready to discharge to [facility]. Patient and her daughter/ Legal Guardian Randye Lobo are in a agreement with plan. CSW called WOM to confirm that patient's bed is ready. Provided patient's room number 301 and number to call for report C-Wing [phone] . All discharge information faxed to [facility] via [facility]. Rx's added to discharge packet.   Call to patient's daughter/ Legal Guardian Randye Lobo  to inform her patient would discharge to [facility] today (04/21/15). She is in agreement with plan and is completing paperwork at [facility] currently. RN will call report and patient will discharge to [facility] via [facility].   Woodroe Mode, MSW, [name]-A Clinical Social Work Department [phone]

## 2015-04-21 NOTE — Progress Notes (Signed)
Checked to see if pre-authorization would be needed for non-emergent EMS transport. Per [employer] benefits obtained online through [employer], patient has a [employer] Medicare Complete Choice PPO policy.  Medicare PPO plans do not require pre-auth for non-emergent ground transports using service codes A0426 or [id number].   °

## 2015-04-21 NOTE — Discharge Summary (Addendum)
[facility] - [place] at [facility]   PATIENT NAME: [name]    MR#:  [id number]  DATE OF BIRTH:  03-10-1946  DATE OF ADMISSION:  04/13/2015 ADMITTING PHYSICIAN: Oralia Manis, MD  DATE OF DISCHARGE: 04/21/2015 PRIMARY CARE PHYSICIAN: Filbert Berthold, NP    ADMISSION DIAGNOSIS:  Metabolic encephalopathy [G93.41] Hyperlipidemia [E78.5] Seizures (HCC) [R56.9] End stage renal disease on dialysis (HCC) [N18.6, Z99.2] NSTEMI (non-ST elevated myocardial infarction) (HCC) [I21.4] Chronic systolic CHF (congestive heart failure) (HCC) [I50.22] Secondary hypertension, unspecified [I15.9] 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 Stroke (HCC)   . MI (myocardial infarction) (HCC)   . Hearing impaired   . Hyperlipidemia   . Hypertension   . Diabetes mellitus without complication (HCC)   . Depression   . Coronary artery disease   . Dementia   . Secondary hyperparathyroidism (HCC)   . Chronic systolic CHF (congestive heart failure) (HCC)   . GERD (gastroesophageal reflux disease)   . Personal history of transient ischemic attack (TIA) and cerebral infarction without residual deficit   . Anemia   . Moderate aortic stenosis     a. echo 04/2014: 40-50%, moderate AS b. echo 01/2015: EF 35-40%, Severe AS, mild MR, moderate TR  . ESRD (end stage renal disease) (HCC)     a. Dialysis MWF    HOSPITAL COURSE:   * Acute encephalopathy - likely due to seizure resolved and she is back to baseline.  * Seizures (HCC) -Keppra dose is adjusted.had EEG,results pending Continue Keppra.  * Acute on chronic systolic CHF (congestive heart failure) (HCC) -on hemodialysis as per nephrology.   Has  severe AS - NSTEMI due to AS;heparin drip received. On ASA,plavix and high intensity statins.  -- Without aortic valve surgery,patient's symptoms will continue to progress, worsening cardiac failure, shortness of breath, troponin elevations, near-syncopal syncope, difficulty with hemodialysis . Appreciate palliative care team following the patient.   * Elevated troponin - likely secondary to ischemia in the setting of coronary artery disease, ischemic heart myopathy, exacerbated by severe aortic valve stenosis.   * End stage renal disease on dialysis, she finished today's HD just now. * Type 2 diabetes mellitus with other circulatory complications (HCC) - sliding scale insulin and corresponding glucose checks   * CAD (coronary artery disease) - continue home meds, seen by cardio  * Hyperlipidemia - continue home dose statin Hypertension ; controlled. But BP is low, so HTN medications were discontinued. Deconditioning, physical therapy commended SNF placement.  DISCHARGE CONDITIONS:   Stable, discharge to SNF today.  CONSULTS OBTAINED:  Treatment Team:  Lamont Dowdy, MD Thana Farr, MD  DRUG ALLERGIES:   Allergies  Allergen Reactions  . Contrast Media [Iodinated Diagnostic Agents] Anaphylaxis    DISCHARGE MEDICATIONS:   Current Discharge Medication List    START taking these medications   Details  !! levETIRAcetam (KEPPRA) 500 MG tablet Take 1 tablet (500 mg total) by mouth every Monday, Wednesday, and Friday at 8 PM. Qty: 30 tablet, Refills: 1    !! levETIRAcetam (KEPPRA) 750 MG tablet Take 1 tablet (750 mg total) by mouth daily. Qty: 30 tablet, Refills: 1     !! - Potential duplicate medications found. Please discuss with provider.  CONTINUE these medications which have CHANGED   Details  atorvastatin (LIPITOR) 80 MG tablet Take 1 tablet (80 mg total) by mouth daily. Qty: 30 tablet, Refills: 1      CONTINUE these medications which have NOT CHANGED    Details  aspirin EC 81 MG tablet Take 81 mg by mouth daily.    bisacodyl (DULCOLAX) 10 MG suppository Place 1 suppository (10 mg total) rectally daily as needed for moderate constipation. Qty: 12 suppository, Refills: 0    calcium acetate (PHOSLO) 667 MG capsule Take 1,334 mg by mouth 3 (three) times daily with meals.     Cholecalciferol (VITAMIN D) 2000 units tablet Take 2,000 Units by mouth daily.    clopidogrel (PLAVIX) 75 MG tablet Take 1 tablet (75 mg total) by mouth daily. Qty: 30 tablet, Refills: 3    COD LIVER OIL PO Take 530 mg by mouth daily.    Cranberry 425 MG CAPS Take 850 mg by mouth 2 (two) times daily.    docusate sodium (COLACE) 100 MG capsule Take 100 mg by mouth at bedtime.     iron polysaccharides (NIFEREX) 150 MG capsule Take 150 mg by mouth daily.    lactobacillus acidophilus (BACID) TABS tablet Take 1 tablet by mouth at bedtime.    lidocaine-prilocaine (EMLA) cream Apply 1 application topically as needed (topical anesthesia for hemodialysis if Gebauers and Lidocaine injection are ineffective.). Qty: 30 g, Refills: 0    Nutritional Supplements (FEEDING SUPPLEMENT, NEPRO CARB STEADY,) LIQD Take 237 mLs by mouth 2 (two) times daily between meals. Qty: 60 Can, Refills: 0    ranitidine (ZANTAC) 150 MG tablet Take 1 tablet (150 mg total) by mouth at bedtime. Qty: 90 tablet, Refills: 3      STOP taking these medications     hydrALAZINE (APRESOLINE) 50 MG tablet      isosorbide dinitrate (ISORDIL) 10 MG tablet      levETIRAcetam (KEPPRA) 100 MG/ML solution      lisinopril (PRINIVIL,ZESTRIL) 20 MG tablet      metoprolol tartrate (LOPRESSOR) 25 MG tablet          DISCHARGE INSTRUCTIONS:   If you experience worsening of your admission symptoms, develop shortness of breath, life threatening emergency, suicidal or homicidal thoughts you must seek medical attention immediately by calling 911 or calling your MD immediately  if symptoms less severe.  You  Must read complete instructions/literature along with all the possible adverse reactions/side effects for all the Medicines you take and that have been prescribed to you. Take any new Medicines after you have completely understood and accept all the possible adverse reactions/side effects.   Please note  You were cared for by a hospitalist during your hospital stay. If you have any questions about your discharge medications or the care you received while you were in the hospital after you are discharged, you can call the unit and asked to speak with the hospitalist on call if the hospitalist that took care of you is not available. Once you are discharged, your primary care physician will handle any further medical issues. Please note that NO REFILLS for any discharge medications will be authorized once you are discharged, as it is imperative that you return to your primary care physician (or establish a relationship with a primary care physician if you do not have one) for your aftercare needs so that they can reassess your need for medications and monitor your lab values.    Today   SUBJECTIVE  No complaint.  VITAL SIGNS:  Blood pressure 104/62, pulse 76, temperature 98.1 F (36.7 C), temperature source Oral, resp. rate 20, height  (1.626 m), weight 61.9 kg (136 lb 7.4 oz), SpO2 100 %.  I/O:   Intake/Output Summary (Last 24 hours) at 04/21/15 1338 Last data filed at 04/21/15 1324  Gross per 24 hour  Intake    240 ml  Output   1000 ml  Net   -760 ml    PHYSICAL EXAMINATION:  GENERAL:  70 y.o.-year-old patient lying in the bed with no acute distress.  EYES: Pupils equal, round, reactive to light and accommodation. No scleral icterus. Extraocular muscles intact.  HEENT: Head atraumatic, normocephalic. Oropharynx and nasopharynx clear.  NECK:  Supple, no jugular venous distention. No thyroid enlargement, no tenderness.  LUNGS: Normal breath sounds bilaterally, no wheezing,  rales,rhonchi or crepitation. No use of accessory muscles of respiration.  CARDIOVASCULAR: S1, S2 normal. Systolic murmurs 3/6, no rubs, or gallops.  ABDOMEN: Soft, non-tender, non-distended. Bowel sounds present. No organomegaly or mass.  EXTREMITIES: No pedal edema, cyanosis, or clubbing.  NEUROLOGIC: not follow commands, unable to exam. PSYCHIATRIC: The patient is demented. SKIN: No obvious rash, lesion, or ulcer.   DATA REVIEW:   CBC  Recent Labs Lab 04/19/15 0452  WBC 4.6  HGB 10.5*  HCT 32.4*  PLT 193    Chemistries   Recent Labs Lab 04/19/15 0452  NA 140  K 3.9  CL 98*  CO2 30  GLUCOSE 138*  BUN 60*  CREATININE 6.14*  CALCIUM 8.6*    Cardiac Enzymes No results for input(s): TROPONINI in the last 168 hours.  Microbiology Results  Results for orders placed or performed during the hospital encounter of 04/13/15  Culture, blood (routine x 2)     Status: None   Collection Time: 04/13/15  2:26 AM  Result Value Ref Range Status   Specimen Description BLOOD BLOOD RIGHT FOREARM  Final   Special Requests BOTTLES DRAWN AEROBIC AND ANAEROBIC  Final   Culture NO GROWTH 5 DAYS  Final   Report Status 04/18/2015 FINAL  Final  Culture, blood (routine x 2)     Status: None   Collection Time: 04/13/15  2:50 AM  Result Value Ref Range Status   Specimen Description BLOOD RIGHT HAND  Final   Special Requests BOTTLES DRAWN AEROBIC AND ANAEROBIC  Final   Culture NO GROWTH 5 DAYS  Final   Report Status 04/18/2015 FINAL  Final  MRSA PCR Screening     Status: None   Collection Time: 04/13/15  6:18 AM  Result Value Ref Range Status   MRSA by PCR NEGATIVE NEGATIVE Final    Comment:        The GeneXpert MRSA Assay (FDA approved for NASAL specimens only), is one component of a comprehensive MRSA colonization surveillance program. It is not intended to diagnose MRSA infection nor to guide or monitor treatment for MRSA infections.     RADIOLOGY:  No results  found.      Management plans discussed with the patient, family and they are in agreement.  CODE STATUS:     Code Status Orders        Start     Ordered   04/13/15 0619  Full code   Continuous     04/13/15 0618    Code Status History    Date Active Date Inactive Code Status Order ID Comments User Context   02/04/2015  5:09 PM 02/23/2015  9:12 PM Full Code [id number]  Altamese Dilling, MD Inpatient   02/04/2015 12:22 PM 02/04/2015  5:09 PM Full Code [id number]  Annice Needy, MD Inpatient   12/02/2014  5:37 PM 12/05/2014  6:47 PM Full Code [id number]  Houston Siren, MD Inpatient      TOTAL TIME TAKING CARE OF THIS PATIENT: 36 minutes.    Shaune Pollack M.D on 04/21/2015 at 1:38 PM  Between 7am to 6pm - Pager - [phone]  After 6pm go to [email or web address] - password [email or web address] [facility]  [place]  Office  [phone]  CC: Primary care physician; Amy Diana Eves, NP

## 2015-04-21 NOTE — Progress Notes (Signed)
Patient discharged to white [facility] called to receiving facility,patient transported by EMS and hemodynamics within normal limits upon discharge.

## 2015-04-21 NOTE — Care Management Important Message (Signed)
Important Message  Patient Details  Name: [name] MRN: [id number] Date of Birth: Mar 18, 1946   Medicare Important Message Given:  Yes    [name] A [name] 04/21/2015, 11:20 AM

## 2015-04-21 NOTE — Progress Notes (Signed)
PT Cancellation Note  Patient Details Name: [name] MRN: [id number] DOB: [birth year]   Cancelled Treatment:    Reason Eval/Treat Not Completed: Patient at procedure or test/unavailable (Pt off floor at dialysis.)  Will re-attempt PT treatment session at a later date/time.   Irving Burton Kriti Katayama 04/21/2015, 10:25 AM Hendricks Limes, PT [phone]

## 2015-04-27 ENCOUNTER — Other Ambulatory Visit: Payer: Self-pay | Admitting: Family Medicine

## 2015-04-27 DIAGNOSIS — R1310 Dysphagia, unspecified: Secondary | ICD-10-CM

## 2015-04-28 ENCOUNTER — Emergency Department: Payer: Medicare Other

## 2015-04-28 ENCOUNTER — Observation Stay (HOSPITAL_COMMUNITY)
Admission: EM | Admit: 2015-04-28 | Discharge: 2015-04-30 | Disposition: A | Discharge status: Skilled Nursing Facility | Payer: Medicare Other | Source: Home / Self Care | Attending: Emergency Medicine | Admitting: Emergency Medicine

## 2015-04-28 ENCOUNTER — Encounter: Payer: Self-pay | Admitting: Emergency Medicine

## 2015-04-28 DIAGNOSIS — Z79899 Other long term (current) drug therapy: Secondary | ICD-10-CM | POA: Insufficient documentation

## 2015-04-28 DIAGNOSIS — R569 Unspecified convulsions: Secondary | ICD-10-CM

## 2015-04-28 DIAGNOSIS — T783XXA Angioneurotic edema, initial encounter: Secondary | ICD-10-CM

## 2015-04-28 DIAGNOSIS — R748 Abnormal levels of other serum enzymes: Secondary | ICD-10-CM

## 2015-04-28 DIAGNOSIS — I255 Ischemic cardiomyopathy: Secondary | ICD-10-CM

## 2015-04-28 DIAGNOSIS — R918 Other nonspecific abnormal finding of lung field: Secondary | ICD-10-CM | POA: Insufficient documentation

## 2015-04-28 DIAGNOSIS — E875 Hyperkalemia: Secondary | ICD-10-CM | POA: Diagnosis present

## 2015-04-28 DIAGNOSIS — I248 Other forms of acute ischemic heart disease: Secondary | ICD-10-CM | POA: Diagnosis present

## 2015-04-28 DIAGNOSIS — Z8249 Family history of ischemic heart disease and other diseases of the circulatory system: Secondary | ICD-10-CM | POA: Insufficient documentation

## 2015-04-28 DIAGNOSIS — I252 Old myocardial infarction: Secondary | ICD-10-CM | POA: Insufficient documentation

## 2015-04-28 DIAGNOSIS — L89620 Pressure ulcer of left heel, unstageable: Secondary | ICD-10-CM | POA: Diagnosis present

## 2015-04-28 DIAGNOSIS — I132 Hypertensive heart and chronic kidney disease with heart failure and with stage 5 chronic kidney disease, or end stage renal disease: Principal | ICD-10-CM | POA: Diagnosis present

## 2015-04-28 DIAGNOSIS — I2510 Atherosclerotic heart disease of native coronary artery without angina pectoris: Secondary | ICD-10-CM | POA: Diagnosis present

## 2015-04-28 DIAGNOSIS — Z9842 Cataract extraction status, left eye: Secondary | ICD-10-CM | POA: Insufficient documentation

## 2015-04-28 DIAGNOSIS — Z992 Dependence on renal dialysis: Secondary | ICD-10-CM

## 2015-04-28 DIAGNOSIS — I959 Hypotension, unspecified: Secondary | ICD-10-CM | POA: Diagnosis present

## 2015-04-28 DIAGNOSIS — Z794 Long term (current) use of insulin: Secondary | ICD-10-CM

## 2015-04-28 DIAGNOSIS — G9341 Metabolic encephalopathy: Secondary | ICD-10-CM

## 2015-04-28 DIAGNOSIS — G40909 Epilepsy, unspecified, not intractable, without status epilepticus: Secondary | ICD-10-CM

## 2015-04-28 DIAGNOSIS — Z8673 Personal history of transient ischemic attack (TIA), and cerebral infarction without residual deficits: Secondary | ICD-10-CM | POA: Insufficient documentation

## 2015-04-28 DIAGNOSIS — X58XXXS Exposure to other specified factors, sequela: Secondary | ICD-10-CM | POA: Insufficient documentation

## 2015-04-28 DIAGNOSIS — E440 Moderate protein-calorie malnutrition: Secondary | ICD-10-CM

## 2015-04-28 DIAGNOSIS — N186 End stage renal disease: Secondary | ICD-10-CM

## 2015-04-28 DIAGNOSIS — E785 Hyperlipidemia, unspecified: Secondary | ICD-10-CM | POA: Insufficient documentation

## 2015-04-28 DIAGNOSIS — Z7982 Long term (current) use of aspirin: Secondary | ICD-10-CM

## 2015-04-28 DIAGNOSIS — R61 Generalized hyperhidrosis: Secondary | ICD-10-CM | POA: Insufficient documentation

## 2015-04-28 DIAGNOSIS — Z9841 Cataract extraction status, right eye: Secondary | ICD-10-CM

## 2015-04-28 DIAGNOSIS — F329 Major depressive disorder, single episode, unspecified: Secondary | ICD-10-CM | POA: Insufficient documentation

## 2015-04-28 DIAGNOSIS — R29810 Facial weakness: Secondary | ICD-10-CM | POA: Diagnosis present

## 2015-04-28 DIAGNOSIS — I25111 Atherosclerotic heart disease of native coronary artery with angina pectoris with documented spasm: Secondary | ICD-10-CM | POA: Insufficient documentation

## 2015-04-28 DIAGNOSIS — D649 Anemia, unspecified: Secondary | ICD-10-CM | POA: Diagnosis present

## 2015-04-28 DIAGNOSIS — I350 Nonrheumatic aortic (valve) stenosis: Secondary | ICD-10-CM | POA: Insufficient documentation

## 2015-04-28 DIAGNOSIS — I445 Left posterior fascicular block: Secondary | ICD-10-CM | POA: Insufficient documentation

## 2015-04-28 DIAGNOSIS — Z7902 Long term (current) use of antithrombotics/antiplatelets: Secondary | ICD-10-CM

## 2015-04-28 DIAGNOSIS — I359 Nonrheumatic aortic valve disorder, unspecified: Secondary | ICD-10-CM | POA: Insufficient documentation

## 2015-04-28 DIAGNOSIS — I43 Cardiomyopathy in diseases classified elsewhere: Secondary | ICD-10-CM

## 2015-04-28 DIAGNOSIS — F0390 Unspecified dementia without behavioral disturbance: Secondary | ICD-10-CM | POA: Diagnosis present

## 2015-04-28 DIAGNOSIS — R55 Syncope and collapse: Secondary | ICD-10-CM | POA: Diagnosis present

## 2015-04-28 DIAGNOSIS — E11649 Type 2 diabetes mellitus with hypoglycemia without coma: Secondary | ICD-10-CM | POA: Diagnosis present

## 2015-04-28 DIAGNOSIS — H9190 Unspecified hearing loss, unspecified ear: Secondary | ICD-10-CM | POA: Diagnosis present

## 2015-04-28 DIAGNOSIS — L89610 Pressure ulcer of right heel, unstageable: Secondary | ICD-10-CM | POA: Diagnosis present

## 2015-04-28 DIAGNOSIS — I5023 Acute on chronic systolic (congestive) heart failure: Secondary | ICD-10-CM

## 2015-04-28 DIAGNOSIS — Z66 Do not resuscitate: Secondary | ICD-10-CM | POA: Diagnosis present

## 2015-04-28 DIAGNOSIS — N2581 Secondary hyperparathyroidism of renal origin: Secondary | ICD-10-CM | POA: Insufficient documentation

## 2015-04-28 DIAGNOSIS — Z91041 Radiographic dye allergy status: Secondary | ICD-10-CM

## 2015-04-28 DIAGNOSIS — K219 Gastro-esophageal reflux disease without esophagitis: Secondary | ICD-10-CM | POA: Diagnosis present

## 2015-04-28 DIAGNOSIS — E1122 Type 2 diabetes mellitus with diabetic chronic kidney disease: Secondary | ICD-10-CM | POA: Insufficient documentation

## 2015-04-28 DIAGNOSIS — D631 Anemia in chronic kidney disease: Secondary | ICD-10-CM

## 2015-04-28 DIAGNOSIS — R4182 Altered mental status, unspecified: Secondary | ICD-10-CM

## 2015-04-28 DIAGNOSIS — Z886 Allergy status to analgesic agent status: Secondary | ICD-10-CM

## 2015-04-28 DIAGNOSIS — I5022 Chronic systolic (congestive) heart failure: Secondary | ICD-10-CM | POA: Diagnosis present

## 2015-04-28 DIAGNOSIS — G9340 Encephalopathy, unspecified: Secondary | ICD-10-CM | POA: Diagnosis present

## 2015-04-28 LAB — COMPREHENSIVE METABOLIC PANEL
ALT: 23 U/L (ref 14–54)
ANION GAP: 16 — AB (ref 5–15)
AST: 28 U/L (ref 15–41)
Albumin: 3.6 g/dL (ref 3.5–5.0)
Alkaline Phosphatase: 110 U/L (ref 38–126)
BUN: 53 mg/dL — ABNORMAL HIGH (ref 6–20)
CHLORIDE: 87 mmol/L — AB (ref 101–111)
CO2: 32 mmol/L (ref 22–32)
CREATININE: 5.51 mg/dL — AB (ref 0.44–1.00)
Calcium: 9.5 mg/dL (ref 8.9–10.3)
GFR, EST AFRICAN AMERICAN: 8 mL/min — AB (ref >60)
GFR, EST NON AFRICAN AMERICAN: 7 mL/min — AB (ref >60)
Glucose, Bld: 158 mg/dL — ABNORMAL HIGH (ref 65–99)
POTASSIUM: 4.8 mmol/L (ref 3.5–5.1)
Sodium: 135 mmol/L (ref 135–145)
Total Bilirubin: 0.8 mg/dL (ref 0.3–1.2)
Total Protein: 7.4 g/dL (ref 6.5–8.1)

## 2015-04-28 LAB — TROPONIN I
TROPONIN I: 0.62 ng/mL — AB (ref <0.031)
TROPONIN I: 0.45 ng/mL — AB (ref <0.031)

## 2015-04-28 LAB — GLUCOSE, CAPILLARY
GLUCOSE-CAPILLARY: 117 mg/dL — AB (ref 65–99)
GLUCOSE-CAPILLARY: 209 mg/dL — AB (ref 65–99)
Glucose-Capillary: 159 mg/dL — ABNORMAL HIGH (ref 65–99)

## 2015-04-28 LAB — CBC WITH DIFFERENTIAL/PLATELET
Basophils Absolute: 0.1 10*3/uL (ref 0–0.1)
Basophils Relative: 2 %
EOS PCT: 0 %
Eosinophils Absolute: 0 10*3/uL (ref 0–0.7)
HCT: 38.8 % (ref 35.0–47.0)
Hemoglobin: 12.4 g/dL (ref 12.0–16.0)
LYMPHS ABS: 0.5 10*3/uL — AB (ref 1.0–3.6)
LYMPHS PCT: 8 %
MCH: 30.9 pg (ref 26.0–34.0)
MCHC: 31.9 g/dL — AB (ref 32.0–36.0)
MCV: 96.7 fL (ref 80.0–100.0)
MONO ABS: 0.4 10*3/uL (ref 0.2–0.9)
MONOS PCT: 7 %
Neutro Abs: 5.3 10*3/uL (ref 1.4–6.5)
Neutrophils Relative %: 83 %
PLATELETS: 259 10*3/uL (ref 150–440)
RBC: 4.01 MIL/uL (ref 3.80–5.20)
RDW: 22.7 % — AB (ref 11.5–14.5)
WBC: 6.3 10*3/uL (ref 3.6–11.0)

## 2015-04-28 LAB — PROTIME-INR
INR: 1.08
Prothrombin Time: 14.2 seconds (ref 11.4–15.0)

## 2015-04-28 MED ORDER — INSULIN ASPART 100 UNIT/ML ~~LOC~~ SOLN
0.0000 [IU] | Freq: Three times a day (TID) | SUBCUTANEOUS | Status: DC
Start: 2015-04-28 — End: 2015-04-30
  Administered 2015-04-28 – 2015-04-29 (×2): 1 [IU] via SUBCUTANEOUS
  Administered 2015-04-29: 3 [IU] via SUBCUTANEOUS
  Administered 2015-04-30 (×2): 2 [IU] via SUBCUTANEOUS
  Filled 2015-04-28: qty 2
  Filled 2015-04-28: qty 1
  Filled 2015-04-28 (×2): qty 2
  Filled 2015-04-28: qty 5
  Filled 2015-04-28: qty 1

## 2015-04-28 MED ORDER — CRANBERRY 450 MG PO TABS: 450.0000 mg | ORAL_TABLET | Freq: Two times a day (BID) | ORAL | Status: DC

## 2015-04-28 MED ORDER — ATORVASTATIN CALCIUM 20 MG PO TABS
80.0000 mg | ORAL_TABLET | Freq: Every day | ORAL | Status: DC
  Administered 2015-04-29 – 2015-04-30 (×2): 80 mg via ORAL
  Filled 2015-04-28 (×2): qty 4

## 2015-04-28 MED ORDER — SODIUM CHLORIDE 0.9% FLUSH
3.0000 mL | Freq: Two times a day (BID) | INTRAVENOUS | Status: DC
Start: 2015-04-28 — End: 2015-04-30
  Administered 2015-04-28 – 2015-04-30 (×5): 3 mL via INTRAVENOUS

## 2015-04-28 MED ORDER — VITAMIN D 1000 UNITS PO TABS
2000.0000 [IU] | ORAL_TABLET | Freq: Every day | ORAL | Status: DC
  Administered 2015-04-29 – 2015-04-30 (×2): 2000 [IU] via ORAL
  Filled 2015-04-28 (×2): qty 2

## 2015-04-28 MED ORDER — CALCIUM ACETATE (PHOS BINDER) 667 MG PO CAPS
1334.0000 mg | ORAL_CAPSULE | Freq: Three times a day (TID) | ORAL | Status: DC
  Administered 2015-04-28 – 2015-04-30 (×6): 1334 mg via ORAL
  Filled 2015-04-28 (×7): qty 2

## 2015-04-28 MED ORDER — BISACODYL 10 MG RE SUPP: 10.0000 mg | Freq: Every day | RECTAL | Status: DC | PRN

## 2015-04-28 MED ORDER — HEPARIN SODIUM (PORCINE) 5000 UNIT/ML IJ SOLN
5000.0000 [IU] | Freq: Three times a day (TID) | INTRAMUSCULAR | Status: DC
  Administered 2015-04-28 – 2015-04-30 (×4): 5000 [IU] via SUBCUTANEOUS
  Filled 2015-04-28 (×4): qty 1

## 2015-04-28 MED ORDER — ONDANSETRON HCL 4 MG/2ML IJ SOLN: 4.0000 mg | Freq: Four times a day (QID) | INTRAMUSCULAR | Status: DC | PRN

## 2015-04-28 MED ORDER — NEPRO/CARBSTEADY PO LIQD
237.0000 mL | Freq: Two times a day (BID) | ORAL | Status: DC
  Administered 2015-04-29 – 2015-04-30 (×3): 237 mL via ORAL

## 2015-04-28 MED ORDER — FAMOTIDINE 20 MG PO TABS
20.0000 mg | ORAL_TABLET | Freq: Every day | ORAL | Status: DC
  Administered 2015-04-28 – 2015-04-30 (×3): 20 mg via ORAL
  Filled 2015-04-28 (×3): qty 1

## 2015-04-28 MED ORDER — LIDOCAINE-PRILOCAINE 2.5-2.5 % EX CREA: 1.0000 "application " | TOPICAL_CREAM | CUTANEOUS | Status: DC | PRN

## 2015-04-28 MED ORDER — CLOPIDOGREL BISULFATE 75 MG PO TABS
75.0000 mg | ORAL_TABLET | Freq: Every day | ORAL | Status: DC
  Administered 2015-04-29 – 2015-04-30 (×2): 75 mg via ORAL
  Filled 2015-04-28 (×2): qty 1

## 2015-04-28 MED ORDER — OXYCODONE HCL 5 MG PO TABS: 5.0000 mg | ORAL_TABLET | ORAL | Status: DC | PRN

## 2015-04-28 MED ORDER — ACETAMINOPHEN 650 MG RE SUPP: 650.0000 mg | Freq: Four times a day (QID) | RECTAL | Status: DC | PRN

## 2015-04-28 MED ORDER — DOCUSATE SODIUM 100 MG PO CAPS
100.0000 mg | ORAL_CAPSULE | Freq: Every day | ORAL | Status: DC
  Administered 2015-04-28: 100 mg via ORAL
  Filled 2015-04-28 (×2): qty 1

## 2015-04-28 MED ORDER — INSULIN ASPART 100 UNIT/ML ~~LOC~~ SOLN
0.0000 [IU] | Freq: Every day | SUBCUTANEOUS | Status: DC
Start: 2015-04-28 — End: 2015-04-30
  Administered 2015-04-28: 2 [IU] via SUBCUTANEOUS
  Administered 2015-04-29: 3 [IU] via SUBCUTANEOUS
  Filled 2015-04-28: qty 3
  Filled 2015-04-28: qty 5

## 2015-04-28 MED ORDER — INSULIN GLARGINE 100 UNIT/ML ~~LOC~~ SOLN
5.0000 [IU] | Freq: Every day | SUBCUTANEOUS | Status: DC
  Administered 2015-04-29 – 2015-04-30 (×2): 5 [IU] via SUBCUTANEOUS
  Filled 2015-04-28 (×4): qty 0.05

## 2015-04-28 MED ORDER — POLYSACCHARIDE IRON COMPLEX 150 MG PO CAPS
150.0000 mg | ORAL_CAPSULE | Freq: Every day | ORAL | Status: DC
  Administered 2015-04-29 – 2015-04-30 (×2): 150 mg via ORAL
  Filled 2015-04-28 (×2): qty 1

## 2015-04-28 MED ORDER — LEVETIRACETAM 750 MG PO TABS
750.0000 mg | ORAL_TABLET | ORAL | Status: DC
  Administered 2015-04-29 – 2015-04-30 (×2): 750 mg via ORAL
  Filled 2015-04-28: qty 1

## 2015-04-28 MED ORDER — MORPHINE SULFATE (PF) 2 MG/ML IV SOLN: 2.0000 mg | INTRAVENOUS | Status: DC | PRN

## 2015-04-28 MED ORDER — ONDANSETRON HCL 4 MG PO TABS: 4.0000 mg | ORAL_TABLET | Freq: Four times a day (QID) | ORAL | Status: DC | PRN

## 2015-04-28 MED ORDER — ACETAMINOPHEN 325 MG PO TABS: 650.0000 mg | ORAL_TABLET | Freq: Four times a day (QID) | ORAL | Status: DC | PRN

## 2015-04-28 MED ORDER — BACID PO TABS
1.0000 | ORAL_TABLET | Freq: Every day | ORAL | Status: DC
Start: 2015-04-28 — End: 2015-04-30
  Administered 2015-04-28 – 2015-04-29 (×2): 1 via ORAL
  Filled 2015-04-28 (×3): qty 1

## 2015-04-28 MED ORDER — ASPIRIN EC 81 MG PO TBEC
81.0000 mg | DELAYED_RELEASE_TABLET | Freq: Every day | ORAL | Status: DC
  Administered 2015-04-29 – 2015-04-30 (×2): 81 mg via ORAL
  Filled 2015-04-28 (×2): qty 1

## 2015-04-28 MED ORDER — LEVETIRACETAM 500 MG PO TABS
500.0000 mg | ORAL_TABLET | ORAL | Status: DC
  Administered 2015-04-29: 500 mg via ORAL
  Filled 2015-04-28 (×2): qty 1

## 2015-04-28 NOTE — H&P (Signed)
[facility] - [place] at [facility]   PATIENT NAME: [name]    MR#:  [id number]  DATE OF BIRTH:  Dec 07, 1945   DATE OF ADMISSION:  04/28/2015  PRIMARY CARE PHYSICIAN: Filbert Berthold, NP   REQUESTING/REFERRING PHYSICIAN: McShane  CHIEF COMPLAINT:   Chief Complaint  Patient presents with  . Altered Mental Status    HISTORY OF PRESENT ILLNESS:  [name]  is a 70 y.o. female with a known history of end-stage renal disease hemodialysis, type 2 diabetes insulin requiring, dementia able to have a conversation at baseline presenting with altered mental status. History obtained from daughter present at bedside. She states the patient was in normal state of health earlier this morning then noted. Diaphoretic with low blood sugar was resolved after some orange juice, but patient underwent physical therapy after labs and the daughter was checking on her and noted that she was unresponsive. The episode lasted apparently close to 30 minutes with waxing and waning mental status daughter states she is now closer to baseline. No further symptoms elicited at this time  PAST MEDICAL HISTORY:   Past Medical History  Diagnosis Date  . Stroke (HCC)   . MI (myocardial infarction) (HCC)   . Hearing impaired   . Hyperlipidemia   . Hypertension   . Diabetes mellitus without complication (HCC)   . Depression   . Coronary artery disease   . Dementia   . Secondary hyperparathyroidism (HCC)   . Chronic systolic CHF (congestive heart failure) (HCC)   . GERD (gastroesophageal reflux disease)   . Personal history of transient ischemic attack (TIA) and cerebral infarction without residual deficit   . Anemia   . Moderate aortic stenosis     a. echo 04/2014: 40-50%, moderate AS b. echo 01/2015: EF 35-40%, Severe AS, mild MR, moderate TR  . ESRD (end stage renal disease) (HCC)     a. Dialysis MWF    PAST SURGICAL HISTORY:   Past Surgical History  Procedure Laterality  Date  . Cataract extraction, bilateral    . Wrist surgery    . Peripheral vascular catheterization Left 07/28/2014    Procedure: A/V Shuntogram/Fistulagram;  Surgeon: Renford Dills, MD;  Location: [facility] INVASIVE CV LAB;  Service: Cardiovascular;  Laterality: Left;  . Peripheral vascular catheterization Left 07/28/2014    Procedure: A/V Shunt Intervention;  Surgeon: Renford Dills, MD;  Location: [facility] INVASIVE CV LAB;  Service: Cardiovascular;  Laterality: Left;  . Peripheral vascular catheterization Left 02/04/2015    Procedure: A/V Shuntogram/Fistulagram;  Surgeon: Annice Needy, MD;  Location: [facility] INVASIVE CV LAB;  Service: Cardiovascular;  Laterality: Left;  . Peripheral vascular catheterization Left 02/04/2015    Procedure: A/V Shunt Intervention;  Surgeon: Annice Needy, MD;  Location: [facility] INVASIVE CV LAB;  Service: Cardiovascular;  Laterality: Left;    SOCIAL HISTORY:   Social History  Substance Use Topics  . Smoking status: Never Smoker   . Smokeless tobacco: Not on file  . Alcohol Use: No    FAMILY HISTORY:   Family History  Problem Relation Age of Onset  . CAD Father     DRUG ALLERGIES:   Allergies  Allergen Reactions  . Contrast Media [Iodinated Diagnostic Agents] Anaphylaxis    REVIEW OF SYSTEMS:  Unable to obtain given patient's mental status/medical condition  MEDICATIONS AT HOME:   Prior to Admission medications   Medication Sig Start Date End Date Taking? Authorizing Provider  aspirin EC 81 MG tablet  Take 81 mg by mouth daily.   Yes Historical Provider, MD  atorvastatin (LIPITOR) 80 MG tablet Take 1 tablet (80 mg total) by mouth daily. 04/20/15  Yes Shaune Pollack, MD  bisacodyl (DULCOLAX) 10 MG suppository Place 1 suppository (10 mg total) rectally daily as needed for moderate constipation. 02/23/15  Yes Ramonita Lab, MD  calcium acetate (PHOSLO) 667 MG capsule Take 1,334 mg by mouth 3 (three) times daily with meals.    Yes Historical Provider, MD   cholecalciferol (VITAMIN D) 1000 units tablet Take 2,000 Units by mouth daily.   Yes Historical Provider, MD  clopidogrel (PLAVIX) 75 MG tablet Take 1 tablet (75 mg total) by mouth daily. 09/07/14  Yes Antonieta Iba, MD  Cranberry 450 MG TABS Take 450 mg by mouth 2 (two) times daily.   Yes Historical Provider, MD  docusate sodium (COLACE) 100 MG capsule Take 100 mg by mouth at bedtime.    Yes Historical Provider, MD  insulin glargine (LANTUS) 100 UNIT/ML injection Inject 5 Units into the skin daily.   Yes Historical Provider, MD  insulin lispro (HUMALOG) 100 UNIT/ML injection Inject 0-9 Units into the skin 4 (four) times daily -  before meals and at bedtime. Pt uses as needed per sliding scale:    0-99:  0 units  100-149:  2 units  150-199:  3 units  200-249:  4 units  250-299:  5 units  300-349:  6 units 350-399:  7 units 400-449:  8 units 450-499:  9 units  500 or greater:  Call MD   Yes Historical Provider, MD  iron polysaccharides (NIFEREX) 150 MG capsule Take 150 mg by mouth daily.   Yes Historical Provider, MD  lactobacillus acidophilus (BACID) TABS tablet Take 1 tablet by mouth at bedtime.   Yes Historical Provider, MD  levETIRAcetam (KEPPRA) 500 MG tablet Take 1 tablet (500 mg total) by mouth every Monday, Wednesday, and Friday at 8 PM. 04/20/15  Yes Shaune Pollack, MD  levETIRAcetam (KEPPRA) 750 MG tablet Take 1 tablet (750 mg total) by mouth daily. 04/20/15  Yes Shaune Pollack, MD  lidocaine-prilocaine (EMLA) cream Apply 1 application topically as needed (prior to dialysis on Monday, Wednesday, and Friday).   Yes Historical Provider, MD  Nutritional Supplements (FEEDING SUPPLEMENT, NEPRO CARB STEADY,) LIQD Take 237 mLs by mouth 2 (two) times daily between meals. 12/05/14  Yes Gale Journey, MD  ranitidine (ZANTAC) 150 MG tablet Take 1 tablet (150 mg total) by mouth at bedtime. 02/16/15  Yes Amy Rusty Aus, NP      VITAL SIGNS:  Blood pressure 121/69, pulse 73, temperature 96.1 F  (35.6 C), temperature source Oral, resp. rate 12, height  (1.626 m), weight 151 lb 1.6 oz (68.539 kg), SpO2 100 %.  PHYSICAL EXAMINATION:   VITAL SIGNS: Filed Vitals:   04/28/15 1155 04/28/15 1420  BP:  121/69  Pulse:  73  Temp: 96.1 F (35.6 C)   Resp:  12   GENERAL:69 y.o.female moderate distress given mental status.  HEAD: Normocephalic, atraumatic.  EYES: Pupils equal, round, reactive to light. Unable to assess extraocular muscles given mental status/medical condition. No scleral icterus.  MOUTH: Dry mucosal membrane. Dentition poor. No abscess noted.  EAR, NOSE, THROAT: Clear without exudates. No external lesions.  NECK: Supple. No thyromegaly. No nodules. No JVD.  PULMONARY: Clear to ascultation, without wheeze rails or rhonci. No use of accessory muscles, Good respiratory effort. good air entry bilaterally CHEST: Nontender to palpation.  CARDIOVASCULAR: S1  and S2. Regular rate and rhythm. No murmurs, rubs, or gallops. 1+ edema. Pedal pulses 2+ bilaterally.  GASTROINTESTINAL: Soft, nontender, nondistended. No masses. Positive bowel sounds. No hepatosplenomegaly.  MUSCULOSKELETAL: No swelling, clubbing, or edema. Range of motion full in all extremities.  NEUROLOGIC: Unable to assess given mental status/medical condition-patient has spontaneous movement in all extremities but unable to follow commands at this time SKIN: No ulceration, lesions, rashes, or cyanosis. Skin warm and dry. Turgor intact.  PSYCHIATRIC: Unable to assess given mental status/medical condition     LABORATORY PANEL:   CBC  Recent Labs Lab 04/28/15 1209  WBC 6.3  HGB 12.4  HCT 38.8  PLT 259   ------------------------------------------------------------------------------------------------------------------  Chemistries   Recent Labs Lab 04/28/15 1209  NA 135  K 4.8  CL 87*  CO2 32  GLUCOSE 158*  BUN 53*  CREATININE 5.51*  CALCIUM 9.5  AST 28  ALT 23  ALKPHOS 110  BILITOT 0.8    ------------------------------------------------------------------------------------------------------------------  Cardiac Enzymes  Recent Labs Lab 04/28/15 1209  TROPONINI 0.62*   ------------------------------------------------------------------------------------------------------------------  RADIOLOGY:  Dg Chest 2 View  04/28/2015  CLINICAL DATA:  Possible syncopal episode, currently nonverbal which is a new finding, history of seizures, previous CVA, coronary artery disease with MI, CHF. EXAM: CHEST  2 VIEW COMPARISON:  Portable chest x-ray of April 13, 2015 FINDINGS: The lungs are borderline hypoinflated. The pulmonary interstitial markings are coarse. The cardiac silhouette remains enlarged. The pulmonary vascularity is mildly engorged. There is no pleural effusion or alveolar infiltrate high-grade wedge compression of T11 with milder compression of T10. IMPRESSION: Low-grade compensated CHF. There is no alveolar pneumonia nor pulmonary edema. Electronically Signed   By: Brianne Maina  [place] M.D.   On: 04/28/2015 13:41   Ct Head Wo Contrast  04/28/2015  CLINICAL DATA:  Patient presents to the ED via EMS from [facility] Patient's daughter was visiting patient and states that patient was acting her normal self and patient lost consciousness. Per EMS, patient appeared unconscious on upon arrival bladder was alert with in light sternal rub. EXAM: CT HEAD WITHOUT CONTRAST TECHNIQUE: Contiguous axial images were obtained from the base of the skull through the vertex without intravenous contrast. COMPARISON:  None. FINDINGS: Remote infarction in the medial LEFT occipital and high parietal lobe again noted. No acute intracranial hemorrhage. No focal mass lesion. No CT evidence of acute infarction. No midline shift or mass effect. No hydrocephalus. Basilar cisterns are patent. There is generalized cortical atrophy and periventricular white matter hypodensities. Paranasal sinuses and  mastoid air  cells are clear. IMPRESSION: 1. No acute intracranial findings. 2. Remote LEFT occipital infarction. 3. Extensive atrophy white matter microvascular disease not changed prior. Electronically Signed   By: Genevive Bi M.D.   On: 04/28/2015 12:46    EKG:   Orders placed or performed during the hospital encounter of 04/28/15  . EKG 12-Lead  . EKG 12-Lead  . ED EKG  . ED EKG    IMPRESSION AND PLAN:   70 year old African-American female history of end-stage renal disease on hemodialysis, type 2 diabetes insulin requiring, dementia who is presenting after a period of unresponsiveness  1. Syncope: Unspecified: Admit to telemetry trend cardiac enzymes initial enzyme elevated this is however still lower than her normal cardiac enzymes if trending upward will initiate heparin drip 2. End-stage renal disease hemodialysis: Consult nephrology for continuation of dialysis 3. Type 2 diabetes insulin requiring: Continue basal insulin at insulin sliding scale with Accu-Chek coverage 3. Hyperlipidemia unspecified statin  therapy 4. Seizure disorder 5. Venous Thromboembolism Prophylactic: Heparin Subcutaneous    All the records are reviewed and case discussed with ED provider. Management plans discussed with the patient, family and they are in agreement.  CODE STATUS: DO NOT RESUSCITATE  TOTAL TIME TAKING CARE OF THIS PATIENT: 35 minutes.    Libra Gatz,  Mardi Mainland.D on 04/28/2015 at 3:34 PM  Between 7am to 6pm - Pager - [phone]  After 6pm: House Pager: - [phone]  [facility]  Office  [phone]  CC: Primary care physician; Amy Diana Eves, NP

## 2015-04-28 NOTE — ED Provider Notes (Addendum)
[facility] Emergency Department Provider Note  ____________________________________________   I have reviewed the triage vital signs and the nursing notes.   HISTORY  Chief Complaint Altered Mental Status    HPI [name] is a 70 y.o. female with a very, complicated past medical history including significant dementia, depression, diabetes mellitus, hyperlipidemia, myocardial infarction the past, chronic CHF, reflux disease, aortic stenosis, dialysis, who presents today with altered mental status. Patient was getting ready to go to dialysis today after having had her physical therapy and it was found that she was very lethargic. No fall or other injury noted. No fever. Upon arrival the daughter who gives most history states that the patient is now close to her baseline. She did have a episode of hypoglycemia at the nursing home which was rapidly corrected but the daughter feels that this is usually enough to get her to seem unwell afterwards. The daughter states that she would like the patient to be a DNR/DNI. The patient herself has no complaints. She will say her name when asked by the daughter. History however from the patient itself is not available. History was also obtained from EMS who stated that the patient did respond to a sternal rub for them.  Past Medical History  Diagnosis Date  . Stroke (HCC)   . MI (myocardial infarction) (HCC)   . Hearing impaired   . Hyperlipidemia   . Hypertension   . Diabetes mellitus without complication (HCC)   . Depression   . Coronary artery disease   . Dementia   . Secondary hyperparathyroidism (HCC)   . Chronic systolic CHF (congestive heart failure) (HCC)   . GERD (gastroesophageal reflux disease)   . Personal history of transient ischemic attack (TIA) and cerebral infarction without residual deficit   . Anemia   . Moderate aortic stenosis     a. echo 04/2014: 40-50%, moderate AS b. echo 01/2015: EF 35-40%,  Severe AS, mild MR, moderate TR  . ESRD (end stage renal disease) (HCC)     a. Dialysis MWF    Patient Active Problem List   Diagnosis Date Noted  . Malnutrition of moderate degree 04/15/2015  . Acute on chronic systolic CHF (congestive heart failure) (HCC) 04/13/2015  . Elevated troponin 04/13/2015  . Acute encephalopathy 04/13/2015  . CAD (coronary artery disease) 04/13/2015  . GERD (gastroesophageal reflux disease) 04/13/2015  . Pressure ulcer 04/13/2015  . Altered mental status   . Aortic valve stenosis, severe   . Cardiomyopathy, ischemic   . Metabolic encephalopathy   . Respiratory failure (HCC)   . Dyspnea   . Severe aortic valve stenosis   . 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 NSTEMI (non-ST elevated myocardial infarction) (HCC)   . Cardiomyopathy, hypertensive, benign   . Angioedema 02/05/2015  . Acute respiratory failure (HCC) 02/05/2015  . Seizures (HCC) 12/02/2014  . Hyperlipidemia 08/11/2014  . End stage renal disease on dialysis (HCC) 05/12/2014  . Chronic systolic CHF (congestive heart failure) (HCC) 05/12/2014  . Secondary hypertension, unspecified 05/12/2014  . Type 2 diabetes mellitus with other circulatory complications (HCC) 05/12/2014  . Moderate aortic valve stenosis 05/12/2014    Past Surgical History  Procedure Laterality Date  . Cataract extraction, bilateral    . Wrist surgery    . Peripheral vascular catheterization Left 07/28/2014    Procedure: A/V Shuntogram/Fistulagram;  Surgeon: Renford Dills, MD;  Location: [facility] INVASIVE CV LAB;  Service: Cardiovascular;  Laterality: Left;  . Peripheral vascular catheterization Left 07/28/2014    Procedure: A/V Shunt Intervention;  Surgeon: Renford Dills, MD;  Location: [facility] INVASIVE CV LAB;  Service: Cardiovascular;  Laterality: Left;  . Peripheral vascular catheterization Left 02/04/2015    Procedure: A/V  Shuntogram/Fistulagram;  Surgeon: Annice Needy, MD;  Location: [facility] INVASIVE CV LAB;  Service: Cardiovascular;  Laterality: Left;  . Peripheral vascular catheterization Left 02/04/2015    Procedure: A/V Shunt Intervention;  Surgeon: Annice Needy, MD;  Location: [facility] INVASIVE CV LAB;  Service: Cardiovascular;  Laterality: Left;    Current Outpatient Rx  Name  Route  Sig  Dispense  Refill  . aspirin EC 81 MG tablet   Oral   Take 81 mg by mouth daily.         [name] atorvastatin (LIPITOR) 80 MG tablet   Oral   Take 1 tablet (80 mg total) by mouth daily.   30 tablet   1   . bisacodyl (DULCOLAX) 10 MG suppository   Rectal   Place 1 suppository (10 mg total) rectally daily as needed for moderate constipation.   12 suppository   0   . calcium acetate (PHOSLO) 667 MG capsule   Oral   Take 1,334 mg by mouth 3 (three) times daily with meals.          . Cholecalciferol (VITAMIN D) 2000 units tablet   Oral   Take 2,000 Units by mouth daily.         . clopidogrel (PLAVIX) 75 MG tablet   Oral   Take 1 tablet (75 mg total) by mouth daily.   30 tablet   3   . COD LIVER OIL PO   Oral   Take 530 mg by mouth daily.         . Cranberry 425 MG CAPS   Oral   Take 850 mg by mouth 2 (two) times daily.         [name] docusate sodium (COLACE) 100 MG capsule   Oral   Take 100 mg by mouth at bedtime.          . iron polysaccharides (NIFEREX) 150 MG capsule   Oral   Take 150 mg by mouth daily.         [name] lactobacillus acidophilus (BACID) TABS tablet   Oral   Take 1 tablet by mouth at bedtime.         . levETIRAcetam (KEPPRA) 500 MG tablet   Oral   Take 1 tablet (500 mg total) by mouth every Monday, Wednesday, and Friday at 8 PM.   30 tablet   1   . levETIRAcetam (KEPPRA) 750 MG tablet   Oral   Take 1 tablet (750 mg total) by mouth daily.   30 tablet   1   . lidocaine-prilocaine (EMLA) cream   Topical   Apply 1 application topically as needed (topical anesthesia for  hemodialysis if Gebauers and Lidocaine injection are ineffective.).   30 g   0   . Nutritional Supplements (FEEDING SUPPLEMENT, NEPRO CARB STEADY,) LIQD   Oral   Take 237 mLs by mouth 2 (two) times daily between meals.   60 Can   0   . ranitidine (ZANTAC) 150 MG tablet   Oral   Take 1 tablet (150 mg total) by mouth at bedtime.   90 tablet   3     Allergies Contrast media  Family History  Problem Relation Age  of Onset  . CAD Father     Social History Social History  Substance Use Topics  . Smoking status: Never Smoker   . Smokeless tobacco: None  . Alcohol Use: No    Review of Systems Cannot obtain second to patient mental status  ____________________________________________   PHYSICAL EXAM:  VITAL SIGNS: ED Triage Vitals  Enc Vitals Group     BP 04/28/15 1148 107/95 mmHg     Pulse Rate 04/28/15 1148 110     Resp 04/28/15 1148 16     Temp 04/28/15 1155 96.1 F (35.6 C)     Temp Source 04/28/15 1155 Oral     SpO2 04/28/15 1148 100 %     Weight 04/28/15 1148 151 lb 1.6 oz (68.539 kg)     Height 04/28/15 1148 5\' 4"  (1.626 m)     Head Cir --      Peak Flow --      Pain Score --      Pain Loc --      Pain Edu? --      Excl. in GC? --     Constitutional: Somnolent but in no acute medical distress, will answer to how are you Eyes: Conjunctivae are normal. PERRL. EOMI. Head: Atraumatic. Nose: No congestion/rhinnorhea. Mouth/Throat: Mucous membranes are moist.  Oropharynx non-erythematous. Neck: No stridor.   Nontender with no meningismus Cardiovascular: Normal rate, regular rhythm. Grossly normal heart sounds.  Good peripheral circulation. Respiratory: Normal respiratory effort.  No retractions. Lungs CTAB. Abdominal: Soft and nontender. No distention. No guarding no rebound Back:  There is no focal tenderness or step off there is no midline tenderness there are no lesions noted. there is no CVA tenderness Musculoskeletal: No lower extremity tenderness.  No joint effusions, no DVT signs strong distal pulses no edema Neurologic:  Very limited exam noted no obvious focality  Skin:  Skin is warm, dry and intact. No rash noted. Psychiatric: No obvious mood disturbance  ____________________________________________   LABS (all labs ordered are listed, but only abnormal results are displayed)  Labs Reviewed  CBC WITH DIFFERENTIAL/PLATELET - Abnormal; Notable for the following:    MCHC 31.9 (*)    RDW 22.7 (*)    Lymphs Abs 0.5 (*)    All other components within normal limits  COMPREHENSIVE METABOLIC PANEL - Abnormal; Notable for the following:    Chloride 87 (*)    Glucose, Bld 158 (*)    BUN 53 (*)    Creatinine, Ser 5.51 (*)    GFR calc non Af Amer 7 (*)    GFR calc Af Amer 8 (*)    Anion gap 16 (*)    All other components within normal limits  TROPONIN I - Abnormal; Notable for the following:    Troponin I 0.62 (*)    All other components within normal limits  GLUCOSE, CAPILLARY - Abnormal; Notable for the following:    Glucose-Capillary 117 (*)    All other components within normal limits  PROTIME-INR  CBG MONITORING, ED   ____________________________________________  EKG  I personally interpreted any EKGs ordered by me or triage Sinus rhythm at 72 bpm, ST inversion in one to 3 and aVF as well as 45 and 6 with repolarization noted anteriorly. This is consistent with prior EKGs. ____________________________________________  RADIOLOGY  I reviewed any imaging ordered by me or triage that were performed during my shift ____________________________________________   PROCEDURES  Procedure(s) performed: None  Critical Care performed: None  ____________________________________________  INITIAL IMPRESSION / ASSESSMENT AND PLAN / ED COURSE  Pertinent labs & imaging results that were available during my care of the patient were reviewed by me and considered in my medical decision making (see chart for details).  The  patient initially was awake but gradually fell asleep. Now she will wake up when I touch her or say her name very loudly but otherwise she is sleeping comfortably. Unclear why this pathology she be present. Patient may have just overdone it at rehabilitation. Very limited history is available. Replete glucose is normal. There is no evidence of significant electrolyte disturbance despite today being dialysis day. Troponin 0.60 exactly for this patient is not particularly elevated. She has a chronically elevated troponin. The daughter unfortunately has left the department and will be vaccine. CT of the head is reassuring.. It is believed that the patient does not make urine. Chest x-ray shows mild CHF but no evidence of pneumonia or pulmonary edema. Unclear why the patient has had decreased sensorium today. Perhaps will require admission for further evaluation. The daughter states that there was approximately 1:30 minutes before they get the patient to wake up ____________________________________________   FINAL CLINICAL IMPRESSION(S) / ED DIAGNOSES  Final diagnoses:  None      This chart was dictated using voice recognition software.  Despite best efforts to proofread,  errors can occur which can change meaning.     Jeanmarie Plant, MD 04/28/15 1353  Jeanmarie Plant, MD 04/28/15 1400

## 2015-04-28 NOTE — Progress Notes (Signed)
Patient admitted to floor, daughter at bedside. No distress noted. Patient speaks at times. Telemetry verified by rn. Skin verified by second nurse Shanda Bumps c. Patient on ra and tolerating. Spoke with dr. Cherylann Ratel regarding dialysis, per md plan for tomorrow. Will continue to monitor

## 2015-04-28 NOTE — Progress Notes (Signed)
PHARMACIST - PHYSICIAN ORDER COMMUNICATION  CONCERNING: P&T Medication Policy on Herbal Medications  DESCRIPTION:  This patient's order for:  Cranberry tablets  has been noted.  This product(s) is classified as an "herbal" or natural product. Due to a lack of definitive safety studies or FDA approval, nonstandard manufacturing practices, plus the potential risk of unknown drug-drug interactions while on inpatient medications, the Pharmacy and Therapeutics Committee does not permit the use of "herbal" or natural products of this type within [place].   ACTION TAKEN: The pharmacy department is unable to verify this order at this time and your patient has been informed of this safety policy. Please reevaluate patient's clinical condition at discharge and address if the herbal or natural product(s) should be resumed at that time.  

## 2015-04-28 NOTE — ED Notes (Signed)
Pt inbed with eyes closed, daughter in room, No acute distress or needs noted at this time

## 2015-04-28 NOTE — ED Notes (Signed)
Patient presents to the ED via [facility] from [facility].  Patient's daughter was visiting patient and states that patient was acting her normal self and patient lost consciousness.  Per EMS patient appeared unconscious on arrival but became alert with a light sternal rub.  Patient is alert at this time but is nonverbal.  Per EMS patient normally can hold conversations.  Patient has a history of seizures.

## 2015-04-29 ENCOUNTER — Ambulatory Visit: Payer: Medicare Other

## 2015-04-29 DIAGNOSIS — R55 Syncope and collapse: Secondary | ICD-10-CM | POA: Diagnosis not present

## 2015-04-29 LAB — CBC
HEMATOCRIT: 37.3 % (ref 35.0–47.0)
HEMOGLOBIN: 11.9 g/dL — AB (ref 12.0–16.0)
MCH: 30.9 pg (ref 26.0–34.0)
MCHC: 32 g/dL (ref 32.0–36.0)
MCV: 96.6 fL (ref 80.0–100.0)
Platelets: 237 10*3/uL (ref 150–440)
RBC: 3.86 MIL/uL (ref 3.80–5.20)
RDW: 23.4 % — ABNORMAL HIGH (ref 11.5–14.5)
WBC: 4.5 10*3/uL (ref 3.6–11.0)

## 2015-04-29 LAB — GLUCOSE, CAPILLARY
GLUCOSE-CAPILLARY: 148 mg/dL — AB (ref 65–99)
GLUCOSE-CAPILLARY: 172 mg/dL — AB (ref 65–99)
GLUCOSE-CAPILLARY: 295 mg/dL — AB (ref 65–99)
Glucose-Capillary: 127 mg/dL — ABNORMAL HIGH (ref 65–99)
Glucose-Capillary: 283 mg/dL — ABNORMAL HIGH (ref 65–99)

## 2015-04-29 LAB — BASIC METABOLIC PANEL
ANION GAP: 13 (ref 5–15)
BUN: 62 mg/dL — AB (ref 6–20)
CO2: 31 mmol/L (ref 22–32)
Calcium: 9.3 mg/dL (ref 8.9–10.3)
Chloride: 89 mmol/L — ABNORMAL LOW (ref 101–111)
Creatinine, Ser: 6.36 mg/dL — ABNORMAL HIGH (ref 0.44–1.00)
GFR, EST AFRICAN AMERICAN: 7 mL/min — AB (ref >60)
GFR, EST NON AFRICAN AMERICAN: 6 mL/min — AB (ref >60)
Glucose, Bld: 217 mg/dL — ABNORMAL HIGH (ref 65–99)
POTASSIUM: 5 mmol/L (ref 3.5–5.1)
SODIUM: 133 mmol/L — AB (ref 135–145)

## 2015-04-29 LAB — TROPONIN I
TROPONIN I: 0.59 ng/mL — AB (ref <0.031)
Troponin I: 0.48 ng/mL — ABNORMAL HIGH (ref <0.031)
Troponin I: 0.37 ng/mL — ABNORMAL HIGH (ref <0.031)

## 2015-04-29 LAB — PHOSPHORUS: PHOSPHORUS: 3 mg/dL (ref 2.5–4.6)

## 2015-04-29 LAB — HEMOGLOBIN A1C: Hgb A1c MFr Bld: 6.7 % — ABNORMAL HIGH (ref 4.0–6.0)

## 2015-04-29 MED ORDER — LIDOCAINE HCL (PF) 1 % IJ SOLN
5.0000 mL | INTRAMUSCULAR | Status: DC | PRN
  Filled 2015-04-29: qty 5

## 2015-04-29 MED ORDER — SODIUM CHLORIDE 0.9 % IV SOLN: 100.0000 mL | INTRAVENOUS | Status: DC | PRN

## 2015-04-29 MED ORDER — HEPARIN SODIUM (PORCINE) 1000 UNIT/ML DIALYSIS
1000.0000 [IU] | INTRAMUSCULAR | Status: DC | PRN
  Filled 2015-04-29: qty 1

## 2015-04-29 MED ORDER — PENTAFLUOROPROP-TETRAFLUOROETH EX AERO
1.0000 "application " | INHALATION_SPRAY | CUTANEOUS | Status: DC | PRN
  Filled 2015-04-29: qty 30

## 2015-04-29 MED ORDER — LIDOCAINE-PRILOCAINE 2.5-2.5 % EX CREA
1.0000 "application " | TOPICAL_CREAM | CUTANEOUS | Status: DC | PRN
  Filled 2015-04-29: qty 5

## 2015-04-29 MED ORDER — ALTEPLASE 2 MG IJ SOLR: 2.0000 mg | Freq: Once | INTRAMUSCULAR | Status: DC | PRN

## 2015-04-29 NOTE — Consult Note (Signed)
Reason for Consult:Syncope Referring Physician: Sherryll Burger  CC: Syncope  HPI: [name] is an 70 y.o. female with a history of dementia.  She is unable to provide any history.  Reports that she does not remember what happened.  History obtained from the chart.  The patient was in normal state of health yesterday morning.  She then became diaphoretic with low blood sugar which resolved after some orange juice.  Latert patient underwent physical therapy after labs and the daughter was checking on her and noted that she was unresponsive. The episode lasted apparently close to 30 minutes with waxing and waning mental status.  Daughter states she is now closer to baseline. No further symptoms elicited at this time. Patient with an episode of unresponsiveness in the past but this was associated with seizure activity.  It dose not appear that seizure acitivty as noted with this occasion.    Past Medical History  Diagnosis Date  . Stroke (HCC)   . MI (myocardial infarction) (HCC)   . Hearing impaired   . Hyperlipidemia   . Hypertension   . Diabetes mellitus without complication (HCC)   . Depression   . Coronary artery disease   . Dementia   . Secondary hyperparathyroidism (HCC)   . Chronic systolic CHF (congestive heart failure) (HCC)   . GERD (gastroesophageal reflux disease)   . Personal history of transient ischemic attack (TIA) and cerebral infarction without residual deficit   . Anemia   . Moderate aortic stenosis     a. echo 04/2014: 40-50%, moderate AS b. echo 01/2015: EF 35-40%, Severe AS, mild MR, moderate TR  . ESRD (end stage renal disease) (HCC)     a. Dialysis MWF    Past Surgical History  Procedure Laterality Date  . Cataract extraction, bilateral    . Wrist surgery    . Peripheral vascular catheterization Left 07/28/2014    Procedure: A/V Shuntogram/Fistulagram;  Surgeon: Renford Dills, MD;  Location: [facility] INVASIVE CV LAB;  Service: Cardiovascular;  Laterality: Left;  .  Peripheral vascular catheterization Left 07/28/2014    Procedure: A/V Shunt Intervention;  Surgeon: Renford Dills, MD;  Location: [facility] INVASIVE CV [facility];  Service: Cardiovascular;  Laterality: Left;  . Peripheral vascular catheterization Left 02/04/2015    Procedure: A/V Shuntogram/Fistulagram;  Surgeon: Annice Needy, MD;  Location: [facility] INVASIVE CV LAB;  Service: Cardiovascular;  Laterality: Left;  . Peripheral vascular catheterization Left 02/04/2015    Procedure: A/V Shunt Intervention;  Surgeon: Annice Needy, MD;  Location: [facility] INV[facility] CV [facility];  Service: Cardiovascular;  Laterality: Left;    Family History  Problem Relation Age of Onset  . CAD Father     Social History:  reports that she has never smoked. She does not have any smokeless tobacco history on file. She reports that she does not drink alcohol or use illicit drugs.  Allergies  Allergen Reactions  . Contrast Media [Iodinated Diagnostic Agents] Anaphylaxis    Medications:  I have reviewed the patient's current medications. Prior to Admission:  Prescriptions prior to admission  Medication Sig Dispense Refill Last Dose  . aspirin EC 81 MG tablet Take 81 mg by mouth daily.   04/28/2015 at 0836  . atorvastatin (LIPITOR) 80 MG tablet Take 1 tablet (80 mg total) by mouth daily. 30 tablet 1 04/28/2015 at Unknown time  . bisacodyl (DULCOLAX) 10 MG suppository Place 1 suppository (10 mg total) rectally daily as needed for moderate constipation. 12 suppository 0 PRN at PRN  .  calcium acetate (PHOSLO) 667 MG capsule Take 1,334 mg by mouth 3 (three) times daily with meals.    04/28/2015 at Unknown time  . cholecalciferol (VITAMIN D) 1000 units tablet Take 2,000 Units by mouth daily.   04/28/2015 at Unknown time  . clopidogrel (PLAVIX) 75 MG tablet Take 1 tablet (75 mg total) by mouth daily. 30 tablet 3 04/28/2015 at 0836  . Cranberry 450 MG TABS Take 450 mg by mouth 2 (two) times daily.   04/28/2015 at Unknown time  . docusate sodium (COLACE)  100 MG capsule Take 100 mg by mouth at bedtime.    04/27/2015 at Unknown time  . insulin glargine (LANTUS) 100 UNIT/ML injection Inject 5 Units into the skin daily.   04/28/2015 at Unknown time  . insulin lispro (HUMALOG) 100 UNIT/ML injection Inject 0-9 Units into the skin 4 (four) times daily -  before meals and at bedtime. Pt uses as needed per sliding scale:    0-99:  0 units  100-149:  2 units  150-199:  3 units  200-249:  4 units  250-299:  5 units  300-349:  6 units 350-399:  7 units 400-449:  8 units 450-499:  9 units  500 or greater:  Call MD   04/28/2015 at Unknown time  . iron polysaccharides (NIFEREX) 150 MG capsule Take 150 mg by mouth daily.   04/28/2015 at Unknown time  . lactobacillus acidophilus (BACID) TABS tablet Take 1 tablet by mouth at bedtime.   04/27/2015 at Unknown time  . levETIRAcetam (KEPPRA) 500 MG tablet Take 1 tablet (500 mg total) by mouth every Monday, Wednesday, and Friday at 8 PM. 30 tablet 1 04/26/2015 at Unknown time  . levETIRAcetam (KEPPRA) 750 MG tablet Take 1 tablet (750 mg total) by mouth daily. 30 tablet 1 04/28/2015 at Unknown time  . lidocaine-prilocaine (EMLA) cream Apply 1 application topically as needed (prior to dialysis on Monday, Wednesday, and Friday).   04/26/2015 at unknown  . Nutritional Supplements (FEEDING SUPPLEMENT, NEPRO CARB STEADY,) LIQD Take 237 mLs by mouth 2 (two) times daily between meals. 60 Can 0 04/28/2015 at Unknown time  . ranitidine (ZANTAC) 150 MG tablet Take 1 tablet (150 mg total) by mouth at bedtime. 90 tablet 3 04/27/2015 at Unknown time   Scheduled: . aspirin EC  81 mg Oral Daily  . atorvastatin  80 mg Oral Daily  . calcium acetate  1,334 mg Oral TID WC  . cholecalciferol  2,000 Units Oral Daily  . clopidogrel  75 mg Oral Daily  . docusate sodium  100 mg Oral QHS  . famotidine  20 mg Oral Daily  . feeding supplement (NEPRO CARB STEADY)  237 mL Oral BID BM  . heparin  5,000 Units Subcutaneous 3 times per day  . insulin aspart   0-5 Units Subcutaneous QHS  . insulin aspart  0-9 Units Subcutaneous TID WC  . insulin glargine  5 Units Subcutaneous Daily  . iron polysaccharides  150 mg Oral Daily  . lactobacillus acidophilus  1 tablet Oral QHS  . levETIRAcetam  500 mg Oral Q M,W,F-2000  . levETIRAcetam  750 mg Oral Q24H  . sodium chloride flush  3 mL Intravenous Q12H    ROS: Unable to obtain from patient due to mental status  Physical Examination: Blood pressure 130/72, pulse 80, temperature 97.7 F (36.5 C), temperature source Oral, resp. rate 20, height  (1.626 m), weight 68.539 kg (151 lb 1.6 oz), SpO2 94 %.  HEENT-  Normocephalic,  no lesions, without obvious abnormality.  Normal external eye and conjunctiva.  Normal TM's bilaterally.  Normal auditory canals and external ears. Normal external nose, mucus membranes and septum.  Normal pharynx. Cardiovascular- S1, S2 normal, pulses palpable throughout   Lungs- chest clear, no wheezing, rales, normal symmetric air entry Abdomen- soft, non-tender; bowel sounds normal; no masses,  no organomegaly Extremities- no edema Lymph-no adenopathy palpable Musculoskeletal-no joint tenderness, deformity or swelling Skin-warm and dry, no hyperpigmentation, vitiligo, or suspicious lesions  Neurological Examination Mental Status: Alert, oriented to name but not place or time.  Unable to provide history.  Speech fluent without evidence of aphasia.  Able to follow simple commands. Cranial Nerves: II: Discs flat bilaterally; Blinks to bilateral confrontation, pupils equal, round, reactive to light and accommodation III,IV, VI: ptosis not present, extra-ocular motions intact bilaterally V,VII: smile symmetric, facial light touch sensation normal bilaterally VIII: hearing normal bilaterally IX,X: gag reflex present XI: unable to test XII: midline tongue extension Motor: Moves all extremities to command against gravity, right somewhat greater than left Sensory: Responds to  light touch bilaterally Deep Tendon Reflexes: 2+ in the upper extremities and absent in the lower extremities Plantars: Right: upgoing   Left: upgoing Cerebellar: Unable to perform Gait: not tested due to safety concerns   Laboratory Studies:   Basic Metabolic Panel:  Recent Labs Lab 04/28/15 1209 04/29/15 0529  NA 135 133*  K 4.8 5.0  CL 87* 89*  CO2 32 31  GLUCOSE 158* 217*  BUN 53* 62*  CREATININE 5.51* 6.36*  CALCIUM 9.5 9.3    Liver Function Tests:  Recent Labs Lab 04/28/15 1209  AST 28  ALT 23  ALKPHOS 110  BILITOT 0.8  PROT 7.4  ALBUMIN 3.6   No results for input(s): LIPASE, AMYLASE in the last 168 hours. No results for input(s): AMMONIA in the last 168 hours.  CBC:  Recent Labs Lab 04/28/15 1209 04/29/15 0529  WBC 6.3 4.5  NEUTROABS 5.3  --   HGB 12.4 11.9*  HCT 38.8 37.3  MCV 96.7 96.6  PLT 259 237    Cardiac Enzymes:  Recent Labs Lab 04/28/15 1209 04/28/15 1742 04/28/15 2322 04/29/15 0529  TROPONINI 0.62* 0.45* 0.48* 0.59*    BNP: Invalid input(s): POCBNP  CBG:  Recent Labs Lab 04/28/15 1754 04/28/15 2009 04/29/15 0738 04/29/15 1123 04/29/15 1707  GLUCAP 148* 209* 172* 295* 127*    Microbiology: Results for orders placed or performed during the hospital encounter of 04/13/15  Culture, blood (routine x 2)     Status: None   Collection Time: 04/13/15  2:26 AM  Result Value Ref Range Status   Specimen Description BLOOD BLOOD RIGHT FOREARM  Final   Special Requests BOTTLES DRAWN AEROBIC AND ANAEROBIC  Final   Culture NO GROWTH 5 DAYS  Final   Report Status 04/18/2015 FINAL  Final  Culture, blood (routine x 2)     Status: None   Collection Time: 04/13/15  2:50 AM  Result Value Ref Range Status   Specimen Description BLOOD RIGHT HAND  Final   Special Requests BOTTLES DRAWN AEROBIC AND ANAEROBIC  Final   Culture NO GROWTH 5 DAYS  Final   Report Status 04/18/2015 FINAL  Final  MRSA PCR Screening     Status:  None   Collection Time: 04/13/15  6:18 AM  Result Value Ref Range Status   MRSA by PCR NEGATIVE NEGATIVE Final    Comment:        The  GeneXpert MRSA Assay (FDA approved for NASAL specimens only), is one component of a comprehensive MRSA colonization surveillance program. It is not intended to diagnose MRSA infection nor to guide or monitor treatment for MRSA infections.     Coagulation Studies:  Recent Labs  04/28/15 1209  LABPROT 14.2  INR 1.08    Urinalysis: No results for input(s): COLORURINE, LABSPEC, PHURINE, GLUCOSEU, HGBUR, BILIRUBINUR, KETONESUR, PROTEINUR, UROBILINOGEN, NITRITE, LEUKOCYTESUR in the last 168 hours.  Invalid input(s): APPERANCEUR  Lipid Panel:     Component Value Date/Time   CHOL 256* 04/22/2014 0220   TRIG 32 02/07/2015 0211   TRIG 22 04/22/2014 0220   HDL 73* 04/22/2014 0220   VLDL 4* 04/22/2014 0220   LDLCALC 179* 04/22/2014 0220    HgbA1C:  Lab Results  Component Value Date   HGBA1C 6.7* 04/28/2015    Urine Drug Screen:  No results found for: LABOPIA, COCAINSCRNUR, LABBENZ, AMPHETMU, THCU, LABBARB  Alcohol Level: No results for input(s): ETH in the last 168 hours.  Other results: EKG: sinus rhythm at 72 bpm.  Imaging: Dg Chest 2 View  04/28/2015  CLINICAL DATA:  Possible syncopal episode, currently nonverbal which is a new finding, history of seizures, previous CVA, coronary artery disease with MI, CHF. EXAM: CHEST  2 VIEW COMPARISON:  Portable chest x-ray of April 13, 2015 FINDINGS: The lungs are borderline hypoinflated. The pulmonary interstitial markings are coarse. The cardiac silhouette remains enlarged. The pulmonary vascularity is mildly engorged. There is no pleural effusion or alveolar infiltrate high-grade wedge compression of T11 with milder compression of T10. IMPRESSION: Low-grade compensated CHF. There is no alveolar pneumonia nor pulmonary edema. Electronically Signed   By: David  [place] M.D.   On: 04/28/2015 13:41    Ct Head Wo Contrast  04/28/2015  CLINICAL DATA:  Patient presents to the ED via EMS from [facility]. Patient's daughter was visiting patient and states that patient was acting her normal self and patient lost consciousness. Per EMS, patient appeared unconscious on upon arrival bladder was alert with in light sternal rub. EXAM: CT HEAD WITHOUT CONTRAST TECHNIQUE: Contiguous axial images were obtained from the base of the skull through the vertex without intravenous contrast. COMPARISON:  None. FINDINGS: Remote infarction in the medial LEFT occipital and high parietal lobe again noted. No acute intracranial hemorrhage. No focal mass lesion. No CT evidence of acute infarction. No midline shift or mass effect. No hydrocephalus. Basilar cisterns are patent. There is generalized cortical atrophy and periventricular white matter hypodensities. Paranasal sinuses and  mastoid air cells are clear. IMPRESSION: 1. No acute intracranial findings. 2. Remote LEFT occipital infarction. 3. Extensive atrophy white matter microvascular disease not changed prior. Electronically Signed   By: Genevive Bi M.D.   On: 04/28/2015 12:46     Assessment/Plan: 70 year old female presenting with an episode of altered mental status.  Appears at baseline at this time.  From description unclear that this was actually a seizure.  Also unclear that if it was a seizure that it was not provoked since there appears to have been some associated hypoglycemia.   Patient on Keppra.  Head CT personally reviewed and shows no acute changes.  Would not change anticonvulsant therapy at this time.    Thana Farr, MD Neurology [phone] 04/29/2015, 8:08 PM

## 2015-04-29 NOTE — Care Management Obs Status (Signed)
MEDICARE OBSERVATION STATUS NOTIFICATION   Patient Details  Name: [name] MRN: [id number] Date of Birth: 1945/04/24   Medicare Observation Status Notification Given:  Other (see comment) called patient's l;egal guardian-Eleanor Love at [phone] and explained OBS status.    Berna Bue, RN 04/29/2015, 9:48 AM

## 2015-04-29 NOTE — Clinical Social Work Note (Addendum)
Clinical Social Work Assessment  Patient Details  Name: [name] MRN: [id number] Date of Birth: 1945-08-07  Date of referral:  04/29/15               Reason for consult:  Discharge Planning                Permission sought to share information with:  Facility Medical sales representative Permission granted to share information::  Yes, Verbal Permission Granted  Name::        Agency::     Relationship::   Randye Lobo Guardian/ HPOA [phone])  Contact Information:     Housing/Transportation Living arrangements for the past 2 months:  Skilled Nursing Facility, Single Family Home Source of Information:  Adult Children Gus P[facility] [phone]) Patient Interpreter Needed:  None Criminal Activity/Legal Involvement Pertinent to Current Situation/Hospitalization:  No - Comment as needed Significant Relationships:  Adult Children Randye Lobo Guardian/ HPOA [phone]) Lives with:  Facility Resident, Adult Children Randye Lobo Guardian/ [place] [phone]) Do you feel safe going back to the place where you live?  Yes Need for family participation in patient care:  Yes (Comment) Randye Lobo Guardian/ [place] [phone])  Care giving concerns: Patient is from [facility]    Social Worker assessment / plan:  04/28/15: CSW saw patient and his daughter Randye Lobo when they were admitted to to 2A. CSW spoke to patient's daughter Vena Austria outside of patient's room. Per Vena Austria patient was doing well at [facility]. She reports that patient took some steps and has been working hard at [employer]. She stated that patient may have "had a stroke or a seizure... I don't know... I just felt her twitching". Per Vena Austria patient may return to [facility] at discharge. Verbal permission to communicate with WOM.   04/29/15: CSW contacted Gavin Pound- admissions coordinator with [facility]. Per Gavin Pound patient may return at discharge. CSW left voicemail for [place]. Awaiting phone call back. CSW will  continue to follow and assist.   FL2/ PASRR completed and faxed to [facility].  Employment status:  Retired Health and safety inspector:  Medicare PT Recommendations:  Not assessed at this time Information / Referral to community resources:  Skilled Nursing Facility  Patient/Family's Response to care:  Patient's daughter Vena Austria is agreeable for patient to return to [facility] at discharge.   Patient/Family's Understanding of and Emotional Response to Diagnosis, Current Treatment, and Prognosis:  Patient's daughter understands CSW' role and is appreciative to CSW's assistance.   Emotional Assessment Appearance:  Appears stated age Attitude/Demeanor/Rapport:   (None ) Affect (typically observed):  Calm, Pleasant Orientation:  Oriented to Self Alcohol / Substance use:  Not Applicable Psych involvement (Current and /or in the community):  No (Comment)  Discharge Needs  Concerns to be addressed:  Discharge Planning Concerns Readmission within the last 30 days:  Yes Current discharge risk:  Chronically ill Barriers to Discharge:  Continued Medical Work up   [place], [name] 04/29/2015, 10:02 AM

## 2015-04-29 NOTE — Progress Notes (Signed)
Pastoral Care and Prayer provided for patient and family

## 2015-04-29 NOTE — NC FL2 (Signed)
[place] MEDICAID FL2 LEVEL OF CARE SCREENING TOOL     IDENTIFICATION  Patient Name: [name] Birthdate: 1945-10-09 Sex: female Admission Date (Current Location): 04/28/2015  [place] and [place] Number:  Chiropodist and Address:  [facility], [address], [place], [place] [address]      Provider Number: [id number]  Attending Physician Name and Address:  Delfino Lovett, MD  Relative Name and Phone Number:       Current Level of Care: Hospital Recommended Level of Care: Skilled Nursing Facility Prior Approval Number:    Date Approved/Denied:   PASRR Number:  [id number])  Discharge Plan: SNF    Current Diagnoses: Patient Active Problem List   Diagnosis Date Noted  . Syncope 04/28/2015  . Malnutrition of moderate degree 04/15/2015  . Acute on chronic systolic CHF (congestive heart failure) (HCC) 04/13/2015  . Elevated troponin 04/13/2015  . Acute encephalopathy 04/13/2015  . CAD (coronary artery disease) 04/13/2015  . GERD (gastroesophageal reflux disease) 04/13/2015  . Pressure ulcer 04/13/2015  . Altered mental status   . Aortic valve stenosis, severe   . Cardiomyopathy, ischemic   . Metabolic encephalopathy   . Respiratory failure (HCC)   . Dyspnea   . Severe aortic valve stenosis   . 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 NSTEMI (non-ST elevated myocardial infarction) (HCC)   . Cardiomyopathy, hypertensive, benign   . Angioedema 02/05/2015  . Acute respiratory failure (HCC) 02/05/2015  . Seizures (HCC) 12/02/2014  . Hyperlipidemia 08/11/2014  . End stage renal disease on dialysis (HCC) 05/12/2014  . Chronic systolic CHF (congestive heart failure) (HCC) 05/12/2014  . Secondary hypertension, unspecified 05/12/2014  . Type 2 diabetes mellitus with other circulatory complications (HCC) 05/12/2014  . Moderate aortic valve stenosis  05/12/2014    Orientation RESPIRATION BLADDER Height & Weight     Self  Normal Incontinent Weight: 151 lb 1.6 oz (68.539 kg) Height:  5\' 4"  (162.6 cm)  BEHAVIORAL SYMPTOMS/MOOD NEUROLOGICAL BOWEL NUTRITION STATUS   (None )  (None ) Incontinent Diet (renal with fluid restriction )  AMBULATORY STATUS COMMUNICATION OF NEEDS Skin   Extensive Assist Verbally Other (Comment) (Pressure Ulcer stage 1 Mid Sacrum & Pressure Ulcer Deep Tissue Injury Bilateral Heel)                       Personal Care Assistance Level of Assistance  Bathing, Feeding, Dressing Bathing Assistance: Limited assistance Feeding assistance: Independent Dressing Assistance: Limited assistance     Functional Limitations Info  Sight, Hearing, Speech Sight Info: Adequate Hearing Info: Adequate Speech Info: Adequate    SPECIAL CARE FACTORS FREQUENCY                       Contractures      Additional Factors Info  Insulin Sliding Scale, Code Status, Allergies Code Status Info:  (DNR ) Allergies Info:  (Contrast Media)   Insulin Sliding Scale Info:  (insulin aspart (novoLOG) injection 0-5 Units 0-5 Units, Subcutaneous, Daily at bedtime & insulin aspart (novoLOG) injection 0-9 Units 0-9 Units, Subcutaneous, 3 times daily with meals )       Current Medications (04/29/2015):  This is the current hospital active medication list Current Facility-Administered Medications  Medication Dose Route Frequency Provider Last Rate Last Dose  . 0.9 %  sodium chloride infusion  100 mL Intravenous PRN Munsoor Lateef, MD      .  0.9 %  sodium chloride infusion  100 mL Intravenous PRN Munsoor Lateef, MD      . acetaminophen (TYLENOL) tablet 650 mg  650 mg Oral Q6H PRN Wyatt Haste, MD       Or  . acetaminophen (TYLENOL) suppository 650 mg  650 mg Rectal Q6H PRN Wyatt Haste, MD      . alteplase (CATHFLO ACTIVASE) injection 2 mg  2 mg Intracatheter Once PRN Munsoor Lateef, MD      . aspirin EC tablet 81 mg  81 mg  Oral Daily Wyatt Haste, MD   81 mg at 04/29/15 0825  . atorvastatin (LIPITOR) tablet 80 mg  80 mg Oral Daily Wyatt Haste, MD   80 mg at 04/29/15 1610  . bisacodyl (DULCOLAX) suppository 10 mg  10 mg Rectal Daily PRN Wyatt Haste, MD      . calcium acetate (PHOSLO) capsule 1,334 mg  1,334 mg Oral TID WC Wyatt Haste, MD   1,334 mg at 04/29/15 0826  . cholecalciferol (VITAMIN D) tablet 2,000 Units  2,000 Units Oral Daily Wyatt Haste, MD   2,000 Units at 04/29/15 0825  . clopidogrel (PLAVIX) tablet 75 mg  75 mg Oral Daily Wyatt Haste, MD   75 mg at 04/29/15 0824  . docusate sodium (COLACE) capsule 100 mg  100 mg Oral QHS Wyatt Haste, MD   100 mg at 04/28/15 2040  . famotidine (PEPCID) tablet 20 mg  20 mg Oral Daily Wyatt Haste, MD   20 mg at 04/29/15 0825  . feeding supplement (NEPRO CARB STEADY) liquid 237 mL  237 mL Oral BID BM Wyatt Haste, MD      . heparin injection 1,000 Units  1,000 Units Dialysis PRN Munsoor Lateef, MD      . heparin injection 5,000 Units  5,000 Units Subcutaneous 3 times per day Wyatt Haste, MD   5,000 Units at 04/29/15 0547  . insulin aspart (novoLOG) injection 0-5 Units  0-5 Units Subcutaneous QHS Wyatt Haste, MD   2 Units at 04/28/15 2039  . insulin aspart (novoLOG) injection 0-9 Units  0-9 Units Subcutaneous TID WC Wyatt Haste, MD   3 Units at 04/29/15 [phone]  . insulin glargine (LANTUS) injection 5 Units  5 Units Subcutaneous Daily Wyatt Haste, MD   5 Units at 04/29/15 [phone]  . iron polysaccharides (NIFEREX) capsule 150 mg  150 mg Oral Daily Wyatt Haste, MD   150 mg at 04/29/15 8119  . lactobacillus acidophilus (BACID) tablet 1 tablet  1 tablet Oral QHS Wyatt Haste, MD   1 tablet at 04/28/15 2039  . levETIRAcetam (KEPPRA) tablet 500 mg  500 mg Oral Q M,W,F-2000 Wyatt Haste, MD   500 mg at 04/29/15 0036  . levETIRAcetam (KEPPRA) tablet 750 mg  750 mg Oral Q24H Wyatt Haste, MD   750 mg at 04/28/15 1708  . lidocaine (PF) (XYLOCAINE) 1 % injection  5 mL  5 mL Intradermal PRN Munsoor Lateef, MD      . lidocaine-prilocaine (EMLA) cream 1 application  1 application Topical PRN Wyatt Haste, MD      . lidocaine-prilocaine (EMLA) cream 1 application  1 application Topical PRN Munsoor Lateef, MD      . morphine 2 MG/ML injection 2 mg  2 mg Intravenous Q4H PRN Wyatt Haste, MD      . ondansetron [facility]) tablet 4 mg  4 mg  Oral Q6H PRN Wyatt Haste, MD       Or  . ondansetron [facility]) injection 4 mg  4 mg Intravenous Q6H PRN Wyatt Haste, MD      . oxyCODONE (Oxy IR/ROXICODONE) immediate release tablet 5 mg  5 mg Oral Q4H PRN Wyatt Haste, MD      . pentafluoroprop-tetrafluoroeth (GEBAUERS) aerosol 1 application  1 application Topical PRN Munsoor Lateef, MD      . sodium chloride flush (NS) 0.9 % injection 3 mL  3 mL Intravenous Q12H Wyatt Haste, MD   3 mL at 04/29/15 1610     Discharge Medications: Please see discharge summary for a list of discharge medications.  Relevant Imaging Results:  Relevant Lab Results:   Additional Information  (SSN [id number])  Verta Ellen Corey Laski, [name]

## 2015-04-29 NOTE — Progress Notes (Signed)
[facility]  ROUNDING NOTE   Subjective:  Patient well-known to [place] from prior admissions.  She has long-standing end-stage renal disease. She currently presented with altered mental status. Apparently she had an episode of hypoglycemia.  Thereafter she became unresponsivewhich lasted for 30 min. This a.m. She was awake and alert and eatingbreakfast. She followed simple commands.    Objective:  Vital signs in last 24 hours:  Temp:  [97.4 F (36.3 C)-97.8 F (36.6 C)] 97.4 F (36.3 C) (02/09 1143) Pulse Rate:  [70-89] 89 (02/09 1143) Resp:  [12-23] 20 (02/09 1330) BP: (106-122)/(59-78) 117/65 mmHg (02/09 1330) SpO2:  [92 %-100 %] 100 % (02/09 1143)  Weight change:  Filed Weights   04/28/15 1148  Weight: 68.539 kg (151 lb 1.6 oz)    Intake/Output: I/O last 3 completed shifts: In: 100 [P.O.:100] Out: 0    Intake/Output this shift:     Physical Exam: General: NAD, sitting up in bed  Head: Normocephalic, atraumatic. Moist oral mucosal membranes  Eyes: Anicteric, PERRL  Neck: Supple, trachea midline  Lungs:  Clear to auscultation  Heart: S1S2 no rubs  Abdomen:  Soft, nontender, BS present   Extremities: trace peripheral edema.  Neurologic: Awake, alert, followed simple commands  Skin: No lesions  Access: LUE AVG    Basic Metabolic Panel:  Recent Labs Lab 04/28/15 1209 04/29/15 0529  NA 135 133*  K 4.8 5.0  CL 87* 89*  CO2 32 31  GLUCOSE 158* 217*  BUN 53* 62*  CREATININE 5.51* 6.36*  CALCIUM 9.5 9.3    Liver Function Tests:  Recent Labs Lab 04/28/15 1209  AST 28  ALT 23  ALKPHOS 110  BILITOT 0.8  PROT 7.4  ALBUMIN 3.6   No results for input(s): LIPASE, AMYLASE in the last 168 hours. No results for input(s): AMMONIA in the last 168 hours.  CBC:  Recent Labs Lab 04/28/15 1209 04/29/15 0529  WBC 6.3 4.5  NEUTROABS 5.3  --   HGB 12.4 11.9*  HCT 38.8 37.3  MCV 96.7 96.6  PLT 259 237    Cardiac Enzymes:  Recent  Labs Lab 04/28/15 1209 04/28/15 1742 04/28/15 2322 04/29/15 0529  TROPONINI 0.62* 0.45* 0.48* 0.59*    BNP: Invalid input(s): POCBNP  CBG:  Recent Labs Lab 04/28/15 1346 04/28/15 1754 04/28/15 2009 04/29/15 0738 04/29/15 1123  GLUCAP 159* 148* 209* 172* 295*    Microbiology: Results for orders placed or performed during the hospital encounter of 04/13/15  Culture, blood (routine x 2)     Status: None   Collection Time: 04/13/15  2:26 AM  Result Value Ref Range Status   Specimen Description BLOOD BLOOD RIGHT FOREARM  Final   Special Requests BOTTLES DRAWN AEROBIC AND ANAEROBIC  Final   Culture NO GROWTH 5 DAYS  Final   Report Status 04/18/2015 FINAL  Final  Culture, blood (routine x 2)     Status: None   Collection Time: 04/13/15  2:50 AM  Result Value Ref Range Status   Specimen Description BLOOD RIGHT HAND  Final   Special Requests BOTTLES DRAWN AEROBIC AND ANAEROBIC  Final   Culture NO GROWTH 5 DAYS  Final   Report Status 04/18/2015 FINAL  Final  MRSA PCR Screening     Status: None   Collection Time: 04/13/15  6:18 AM  Result Value Ref Range Status   MRSA by PCR NEGATIVE NEGATIVE Final    Comment:        The GeneXpert  MRSA Assay (FDA approved for NASAL specimens only), is one component of a comprehensive MRSA colonization surveillance program. It is not intended to diagnose MRSA infection nor to guide or monitor treatment for MRSA infections.     Coagulation Studies:  Recent Labs  04/28/15 1209  LABPROT 14.2  INR 1.08    Urinalysis: No results for input(s): COLORURINE, LABSPEC, PHURINE, GLUCOSEU, HGBUR, BILIRUBINUR, KETONESUR, PROTEINUR, UROBILINOGEN, NITRITE, LEUKOCYTESUR in the last 72 hours.  Invalid input(s): APPERANCEUR    Imaging: Dg Chest 2 View  04/28/2015  CLINICAL DATA:  Possible syncopal episode, currently nonverbal which is a new finding, history of seizures, previous CVA, coronary artery disease with MI, CHF. EXAM: CHEST   2 VIEW COMPARISON:  Portable chest x-ray of April 13, 2015 FINDINGS: The lungs are borderline hypoinflated. The pulmonary interstitial markings are coarse. The cardiac silhouette remains enlarged. The pulmonary vascularity is mildly engorged. There is no pleural effusion or alveolar infiltrate high-grade wedge compression of T11 with milder compression of T10. IMPRESSION: Low-grade compensated CHF. There is no alveolar pneumonia nor pulmonary edema. Electronically Signed   By: David  [place] M.D.   On: 04/28/2015 13:41   Ct Head Wo Contrast  04/28/2015  CLINICAL DATA:  Patient presents to the ED via EMS [facility]. Patient's daughter was visiting patient and states that patient was acting her normal self and patient lost consciousness. Per EMS, patient appeared unconscious on upon arrival bladder was alert with in light sternal rub. EXAM: CT HEAD WITHOUT CONTRAST TECHNIQUE: Contiguous axial images were obtained from the base of the skull through the vertex without intravenous contrast. COMPARISON:  None. FINDINGS: Remote infarction in the medial LEFT occipital and high parietal lobe again noted. No acute intracranial hemorrhage. No focal mass lesion. No CT evidence of acute infarction. No midline shift or mass effect. No hydrocephalus. Basilar cisterns are patent. There is generalized cortical atrophy and periventricular white matter hypodensities. Paranasal sinuses and  mastoid air cells are clear. IMPRESSION: 1. No acute intracranial findings. 2. Remote LEFT occipital infarction. 3. Extensive atrophy white matter microvascular disease not changed prior. Electronically Signed   By: Genevive Bi M.D.   On: 04/28/2015 12:46     Medications:     . aspirin EC  81 mg Oral Daily  . atorvastatin  80 mg Oral Daily  . calcium acetate  1,334 mg Oral TID WC  . cholecalciferol  2,000 Units Oral Daily  . clopidogrel  75 mg Oral Daily  . docusate sodium  100 mg Oral QHS  . famotidine  20 mg Oral  Daily  . feeding supplement (NEPRO CARB STEADY)  237 mL Oral BID BM  . heparin  5,000 Units Subcutaneous 3 times per day  . insulin aspart  0-5 Units Subcutaneous QHS  . insulin aspart  0-9 Units Subcutaneous TID WC  . insulin glargine  5 Units Subcutaneous Daily  . iron polysaccharides  150 mg Oral Daily  . lactobacillus acidophilus  1 tablet Oral QHS  . levETIRAcetam  500 mg Oral Q M,W,F-2000  . levETIRAcetam  750 mg Oral Q24H  . sodium chloride flush  3 mL Intravenous Q12H   sodium chloride, sodium chloride, acetaminophen **OR** acetaminophen, alteplase, bisacodyl, heparin, lidocaine (PF), lidocaine-prilocaine, lidocaine-prilocaine, morphine injection, ondansetron **OR** ondansetron (ZOFRAN) IV, oxyCODONE, pentafluoroprop-tetrafluoroeth  Assessment/ Plan:  70 y.o. female 70 y.o. female past medical history of end-stage renal disease, myocardial infarction, CVA, hyperlipidemia, hypertension, diabetes mellitus, depression, secondary hyperparathyroidism, dementia, GERD, anemia chronic kidney disease,  History of angioedema due to oxygen tubing.   [facility] Nephrology MWF [facility] [address]   1. End-stage renal disease on hemodialysis:  Patient did not have hemodialysis yesterday.  Therefore we will proceed with hemodialysis today as well as tomorrow.  2.  Altered mental status.  Patient has had episodes of this in the past.  Previouslythis was related to seizure.  Patient had hypoglycemia prior to admission this time.  Patient was awake this morning and following commands but was not speaking.  Continue to monitor.  3.  Anemia chronic kidney disease.  Hemoglobin currently 11.9.  Hold off on erythropoietin stimulating agents.  4.  Secondary hyperparathyroidism. Check intact PTH and phosphorus with dialysis tomorrow.continue PhosLo 2 tablets by mouth 3 times a day with meals.   LOS:  Ethanael Veith 2/9/20171:52 PM

## 2015-04-29 NOTE — Progress Notes (Signed)
[facility] [place] at [facility]   PATIENT NAME: [name]    MR#:  [id number]  DATE OF BIRTH:  [birth year]  SUBJECTIVE:  CHIEF COMPLAINT:   Chief Complaint  Patient presents with  . Altered Mental Status  , eating breakfast when I saw her, family at bedside.  Feels fine  REVIEW OF SYSTEMS:  Review of Systems  Constitutional: Negative for fever, weight loss, malaise/fatigue and diaphoresis.  HENT: Negative for ear discharge, ear pain, hearing loss, nosebleeds, sore throat and tinnitus.   Eyes: Negative for blurred vision and pain.  Respiratory: Negative for cough, hemoptysis, shortness of breath and wheezing.   Cardiovascular: Negative for chest pain, palpitations, orthopnea and leg swelling.  Gastrointestinal: Negative for heartburn, nausea, vomiting, abdominal pain, diarrhea, constipation and blood in stool.  Genitourinary: Negative for dysuria, urgency and frequency.  Musculoskeletal: Negative for myalgias and back pain.  Skin: Negative for itching and rash.  Neurological: Positive for loss of consciousness. Negative for dizziness, tingling, tremors, focal weakness, seizures, weakness and headaches.  Psychiatric/Behavioral: Negative for depression. The patient is not nervous/anxious.     DRUG ALLERGIES:   Allergies  Allergen Reactions  . Contrast Media [Iodinated Diagnostic Agents] Anaphylaxis   VITALS:  Blood pressure 130/72, pulse 80, temperature 97.7 F (36.5 C), temperature source Oral, resp. rate 20, height  (1.626 m), weight 68.539 kg (151 lb 1.6 oz), SpO2 94 %. PHYSICAL EXAMINATION:  Physical Exam  Constitutional: She is oriented to person, place, and time and well-developed, well-nourished, and in no distress.  HENT:  Head: Normocephalic and atraumatic.  Eyes: Conjunctivae and EOM are normal. Pupils are equal, round, and reactive to light.  Neck: Normal range of motion. Neck supple. No tracheal deviation present. No  thyromegaly present.  Cardiovascular: Normal rate, regular rhythm and normal heart sounds.   Pulmonary/Chest: Effort normal and breath sounds normal. No respiratory distress. She has no wheezes. She exhibits no tenderness.  Abdominal: Soft. Bowel sounds are normal. She exhibits no distension. There is no tenderness.  Musculoskeletal: Normal range of motion.  Neurological: She is alert and oriented to person, place, and time. No cranial nerve deficit.  Skin: Skin is warm and dry. No rash noted.  Psychiatric: Mood and affect normal.   LABORATORY PANEL:   CBC  Recent Labs Lab 04/29/15 0529  WBC 4.5  HGB 11.9*  HCT 37.3  PLT 237   ------------------------------------------------------------------------------------------------------------------ Chemistries   Recent Labs Lab 04/28/15 1209 04/29/15 0529  NA 135 133*  K 4.8 5.0  CL 87* 89*  CO2 32 31  GLUCOSE 158* 217*  BUN 53* 62*  CREATININE 5.51* 6.36*  CALCIUM 9.5 9.3  AST 28  --   ALT 23  --   ALKPHOS 110  --   BILITOT 0.8  --    RADIOLOGY:  No results found. ASSESSMENT AND PLAN:   70 year old African-American female history of end-stage renal disease on hemodialysis, type 2 diabetes insulin requiring, dementia who is presenting after a period of unresponsiveness  1. Syncope: Unspecified: Pending cardiology consult, along with neurology.  Troponin -> 0.48-0.59  2. End-stage renal disease hemodialysis: Nephrology following for dialysis need  3. Type 2 diabetes insulin requiring: Continue basal insulin at insulin sliding scale with Accu-Chek coverage 3. Hyperlipidemia unspecified statin therapy 4. Seizure disorder: Pending neurology consultation 5. Venous Thromboembolism Prophylactic: Heparin Subcutaneous    All the records are reviewed and case discussed with Care Management/Social Worker. Management plans discussed with the patient, family and  they are in agreement.  CODE STATUS: DO NOT RESUSCITATE  TOTAL  TIME TAKING CARE OF THIS PATIENT: 35 minutes.   More than 50% of the time was spent in counseling/coordination of care: YES  POSSIBLE D/C IN 1-2 DAYS, DEPENDING ON CLINICAL CONDITION.  And neurology and cardiology evaluation   [facility], Hildred Mollica M.D on 04/29/2015 at 5:04 PM  Between 7am to 6pm - Pager - [phone]  After 6pm go to [email or web address] - password [email or web address] [facility]  [facility]  Office  [phone]  CC: Primary care physician; Amy L Krebs, NP  Note: This dictation was prepared with Dragon dictation along with smaller phrase technology. Any transcriptional errors that result from this process are unintentional.

## 2015-04-30 DIAGNOSIS — R55 Syncope and collapse: Secondary | ICD-10-CM | POA: Diagnosis not present

## 2015-04-30 DIAGNOSIS — I359 Nonrheumatic aortic valve disorder, unspecified: Secondary | ICD-10-CM | POA: Diagnosis not present

## 2015-04-30 LAB — RENAL FUNCTION PANEL
Albumin: 2.8 g/dL — ABNORMAL LOW (ref 3.5–5.0)
Anion gap: 11 (ref 5–15)
BUN: 56 mg/dL — AB (ref 6–20)
CHLORIDE: 94 mmol/L — AB (ref 101–111)
CO2: 32 mmol/L (ref 22–32)
Calcium: 9.5 mg/dL (ref 8.9–10.3)
Creatinine, Ser: 5.64 mg/dL — ABNORMAL HIGH (ref 0.44–1.00)
GFR, EST AFRICAN AMERICAN: 8 mL/min — AB (ref >60)
GFR, EST NON AFRICAN AMERICAN: 7 mL/min — AB (ref >60)
Glucose, Bld: 145 mg/dL — ABNORMAL HIGH (ref 65–99)
POTASSIUM: 4.5 mmol/L (ref 3.5–5.1)
Phosphorus: 2.7 mg/dL (ref 2.5–4.6)
Sodium: 137 mmol/L (ref 135–145)

## 2015-04-30 LAB — CBC
HEMATOCRIT: 29.5 % — AB (ref 35.0–47.0)
Hemoglobin: 9.6 g/dL — ABNORMAL LOW (ref 12.0–16.0)
MCH: 31.3 pg (ref 26.0–34.0)
MCHC: 32.6 g/dL (ref 32.0–36.0)
MCV: 95.9 fL (ref 80.0–100.0)
Platelets: 198 10*3/uL (ref 150–440)
RBC: 3.07 MIL/uL — AB (ref 3.80–5.20)
RDW: 22.9 % — ABNORMAL HIGH (ref 11.5–14.5)
WBC: 4 10*3/uL (ref 3.6–11.0)

## 2015-04-30 LAB — TROPONIN I
TROPONIN I: 0.37 ng/mL — AB (ref <0.031)
TROPONIN I: 0.49 ng/mL — AB (ref <0.031)

## 2015-04-30 LAB — GLUCOSE, CAPILLARY
GLUCOSE-CAPILLARY: 172 mg/dL — AB (ref 65–99)
GLUCOSE-CAPILLARY: 53 mg/dL — AB (ref 65–99)
Glucose-Capillary: 183 mg/dL — ABNORMAL HIGH (ref 65–99)
Glucose-Capillary: 200 mg/dL — ABNORMAL HIGH (ref 65–99)

## 2015-04-30 LAB — LEVETIRACETAM LEVEL: LEVETIRACETAM: 34.8 ug/mL (ref 10.0–40.0)

## 2015-04-30 MED ORDER — RISAQUAD PO CAPS: 1.0000 | ORAL_CAPSULE | Freq: Every day | ORAL | Status: DC

## 2015-04-30 MED ORDER — DEXTROSE 50 % IV SOLN
1.0000 | Freq: Once | INTRAVENOUS | Status: AC
  Administered 2015-04-30: 50 mL via INTRAVENOUS

## 2015-04-30 MED ORDER — DEXTROSE 50 % IV SOLN
INTRAVENOUS | Status: AC
  Filled 2015-04-30: qty 50

## 2015-04-30 NOTE — Progress Notes (Signed)
Patient d/c'd to [facility]. Education provided, no questions at this time. Patient to be picked up by EMS. Telemetry removed. Report called to [facility]. Trudee Kuster

## 2015-04-30 NOTE — Consult Note (Signed)
Cardiology Consult    Patient ID: [name] MRN: [id number], DOB/AGE: [birth year]   Admit date: 04/28/2015 Date of Consult: 04/30/2015  Primary Physician: Leata Mouse, NP Reason for Consult: Elevated Troponin; Syncope Primary Cardiologist: Dr. Rockey Situ Requesting Provider: Dr. Manuella Ghazi   History of Present Illness    [name] is a 70 y.o. female with past medical history of CAD (s/p NSTEMI 04/2014 - medically managed), chronic systolic CHF (EF 49-67% by echo in 01/2015), severe aortic stenosis, dementia, ESRD (on HD M/W/F), CVA, Type 2 DM, HTN, HLD, Seizure Disorder and chronic anemia who presented to [facility] on 04/28/2015 following loss of consciousness.   During my encounter with the patient she was arousable and able to follow commands but was not talking nor responding to questions. History is obtained by review of the chart, as no family members are present.  The patient's daughter was visiting her at [facility] on 04/28/2015 when was at her baseline initially but subsequently lost consciousness. No seizure activity or fall was noted. Her glucose was checked and noted to be low at that time but was corrected with orange juice alone. EMS was called but upon arrival to the ED, the patient's daughter reported she was back at baseline.  CBC was normal. BMET showed creatinine of  5.51. Glucose 158. Her initial troponin was 0.62. Repeat values have been 0.45, 0.48, 0.59, and 0.37. CT Imaging of the head was obtained which showed no acute intracranial findings.  She was recently admitted from 04/13/2015 - 04/21/2015 with acute encephalopathy. Cardiology was consulted during that admission for her recurrent syncope and elevated troponin. She has severe aortic stenosis and has not been deemed a good surgical candidate for aortic valve replacement. At that time per Dr. Donivan Scull note, "without aortic valve surgery, the patient's symptoms will continue to progress with worsening cardiac failure,  shortness of breath, troponin elevations, near-syncopal syncope, and difficulty with hemodialysis".  During that admission, the patient was still Full Code. Palliative Care met with the patient's daughter last hospitalization and the daughter wanted her mom to go to a SNF for PT and continued rehabilitation. Code status was also addressed and she was to remain a Full Code at time even after review of her severe aortic stenosis and poor prognosis.  In reviewing the ED Provider notes from 04/28/2015, the patient's daughter stated she wanted the patient to be DNR/DNI.  Past Medical History   Past Medical History  Diagnosis Date  . Stroke ([place])   . MI (myocardial infarction) ([place])   . Hearing impaired   . Hyperlipidemia   . Hypertension   . Diabetes mellitus without complication ([place])   . Depression   . Coronary artery disease   . Dementia   . Secondary hyperparathyroidism ([place])   . Chronic systolic CHF (congestive heart failure) ([place])   . GERD (gastroesophageal reflux disease)   . Personal history of transient ischemic attack (TIA) and cerebral infarction without residual deficit   . Anemia   . Moderate aortic stenosis     a. echo 04/2014: 40-50%, moderate AS b. echo 01/2015: EF 35-40%, Severe AS, mild MR, moderate TR  . ESRD (end stage renal disease) ([place])     a. Dialysis MWF    Past Surgical History  Procedure Laterality Date  . Cataract extraction, bilateral    . Wrist surgery    . Peripheral vascular catheterization Left 07/28/2014    Procedure: A/V Shuntogram/Fistulagram;  Surgeon: Katha Cabal, MD;  Location:  [place] [facility];  Service: Cardiovascular;  Laterality: Left;  . Peripheral vascular catheterization Left 07/28/2014    Procedure: A/V Shunt Intervention;  Surgeon: Katha Cabal, MD;  Location: [place] [facility];  Service: Cardiovascular;  Laterality: Left;  . Peripheral vascular catheterization Left 02/04/2015    Procedure: A/V Shuntogram/Fistulagram;   Surgeon: Algernon Huxley, MD;  Location: [place] [facility];  Service: Cardiovascular;  Laterality: Left;  . Peripheral vascular catheterization Left 02/04/2015    Procedure: A/V Shunt Intervention;  Surgeon: Algernon Huxley, MD;  Location: [place] [facility];  Service: Cardiovascular;  Laterality: Left;     Allergies  Allergies  Allergen Reactions  . Contrast Media [Iodinated Diagnostic Agents] Anaphylaxis    Inpatient Medications    . acidophilus  1 capsule Oral QHS  . aspirin EC  81 mg Oral Daily  . atorvastatin  80 mg Oral Daily  . calcium acetate  1,334 mg Oral TID WC  . cholecalciferol  2,000 Units Oral Daily  . clopidogrel  75 mg Oral Daily  . docusate sodium  100 mg Oral QHS  . famotidine  20 mg Oral Daily  . feeding supplement (NEPRO CARB STEADY)  237 mL Oral BID BM  . heparin  5,000 Units Subcutaneous 3 times per day  . insulin aspart  0-5 Units Subcutaneous QHS  . insulin aspart  0-9 Units Subcutaneous TID WC  . insulin glargine  5 Units Subcutaneous Daily  . iron polysaccharides  150 mg Oral Daily  . levETIRAcetam  500 mg Oral Q M,W,F-2000  . levETIRAcetam  750 mg Oral Q24H  . sodium chloride flush  3 mL Intravenous Q12H    Family History    Family History  Problem Relation Age of Onset  . CAD Father     Social History    Social History   Social History  . Marital Status: Widowed    Spouse Name: N/A  . Number of Children: N/A  . Years of Education: N/A   Occupational History  . Not on file.   Social History Main Topics  . Smoking status: Never Smoker   . Smokeless tobacco: Not on file  . Alcohol Use: No  . Drug Use: No  . Sexual Activity: Not on file   Other Topics Concern  . Not on file   Social History Narrative     Review of Systems    Unable to be obtained secondary to patient's current mental status.  Physical Exam    Blood pressure 136/49, pulse 95, temperature 97.7 F (36.5 C), temperature source Oral, resp. rate 16, height 5' 4"   (1.626 m), weight 141 lb 9.6 oz (64.229 kg), SpO2 98 %.  General: Pleasant, frail-appearing African American female appearing in NAD Psych: Normal affect. Neuro: Not responding to questions. Does follow-simple instructions and responds to her name. HEENT: Normal  Neck: Supple without bruits or JVD. Lungs:  Resp regular and unlabored, CTA.without wheezing or rales Heart: RRR, no s3, s4, 4/6 SEM at RUSB, heard throughout precordium. Abdomen: Soft, non-tender, non-distended, BS + x 4.  Extremities: No clubbing, cyanosis or edema. DP/PT/Radials 2+ and equal bilaterally.  Labs    Troponin (Point of Care Test) No results for input(s): TROPIPOC in the last 72 hours.  Recent Labs  04/29/15 0529 04/29/15 2001 04/30/15 0032 04/30/15 0500  TROPONINI 0.59* 0.37* 0.37* 0.49*   Lab Results  Component Value Date   WBC 4.5 04/29/2015   HGB 11.9* 04/29/2015   HCT  37.3 04/29/2015   MCV 96.6 04/29/2015   PLT 237 04/29/2015    Recent Labs Lab 04/28/15 1209 04/29/15 0529  NA 135 133*  K 4.8 5.0  CL 87* 89*  CO2 32 31  BUN 53* 62*  CREATININE 5.51* 6.36*  CALCIUM 9.5 9.3  PROT 7.4  --   BILITOT 0.8  --   ALKPHOS 110  --   ALT 23  --   AST 28  --   GLUCOSE 158* 217*   Lab Results  Component Value Date   CHOL 256* 04/22/2014   HDL 73* 04/22/2014   LDLCALC 179* 04/22/2014   TRIG 32 02/07/2015     Radiology Studies    Dg Chest 2 View: 04/28/2015  CLINICAL DATA:  Possible syncopal episode, currently nonverbal which is a new finding, history of seizures, previous CVA, coronary artery disease with MI, CHF. EXAM: CHEST  2 VIEW COMPARISON:  Portable chest x-ray of April 13, 2015 FINDINGS: The lungs are borderline hypoinflated. The pulmonary interstitial markings are coarse. The cardiac silhouette remains enlarged. The pulmonary vascularity is mildly engorged. There is no pleural effusion or alveolar infiltrate high-grade wedge compression of T11 with milder compression of T10.  IMPRESSION: Low-grade compensated CHF. There is no alveolar pneumonia nor pulmonary edema. Electronically Signed   By: David  Martinique M.D.   On: 04/28/2015 13:41   Ct Head Wo Contrast: 04/28/2015  CLINICAL DATA:  Patient presents to the ED via EMS from [facility] Patient's daughter was visiting patient and states that patient was acting her normal self and patient lost consciousness. Per EMS, patient appeared unconscious on upon arrival bladder was alert with in light sternal rub. EXAM: CT HEAD WITHOUT CONTRAST TECHNIQUE: Contiguous axial images were obtained from the base of the skull through the vertex without intravenous contrast. COMPARISON:  None. FINDINGS: Remote infarction in the medial LEFT occipital and high parietal lobe again noted. No acute intracranial hemorrhage. No focal mass lesion. No CT evidence of acute infarction. No midline shift or mass effect. No hydrocephalus. Basilar cisterns are patent. There is generalized cortical atrophy and periventricular white matter hypodensities. Paranasal sinuses and  mastoid air cells are clear. IMPRESSION: 1. No acute intracranial findings. 2. Remote LEFT occipital infarction. 3. Extensive atrophy white matter microvascular disease not changed prior. Electronically Signed   By: Suzy Bouchard M.D.   On: 04/28/2015 12:46    EKG & Cardiac Imaging    EKG: NSR, HR 72, Diffuse TWI throughout anterior and lateral leads (present in previous tracings).  Echocardiogram: 02/08/2015 Study Conclusions  - Left ventricle: There was moderate concentric hypertrophy. Systolic function was moderately reduced. The estimated ejection fraction was in the range of 35% to 40%. - Aortic valve: There was severe stenosis. There was mild regurgitation. Valve area (VTI): 0.44 cm^2. Valve area (Vmax): 0.39 cm^2. Valve area (Vmean): 0.41 cm^2. - Mitral valve: There was mild regurgitation. - Left atrium: The atrium was mildly dilated. - Right atrium: The atrium  was mildly dilated. - Tricuspid valve: There was moderate regurgitation. - Pulmonic valve: Peak gradient (S): 24 mm Hg.  Assessment & Plan    1. Elevated Troponin - cyclic troponin values have been 0.45, 0.48, 0.59, and 0.37. Values were positive during last admission as well with peak value of 7.70 on 04/13/2015. - EKG shows no acute changes when compared to her previous tracing. - likely due to demand ischemia in the setting of CAD and cardiomyopathy secondary to her severe aortic stenosis.  2.  Severe aortic stenosis/ Recurrent Syncope - Echo from 01/2015 showed severe aortic stenosis (Valve area (VTI): 0.44 cm^2. Valve area (Vmax):0.39 cm^2. Valve area (Vmean): 0.41 cm^2). - Over the past admissions, she has been deemed not a surgical candidate for valve replacement. - per Dr. Donivan Scull note during her previous admission, "without aortic valve surgery, the patient's symptoms will continue to progress with worsening cardiac failure, shortness of breath, troponin elevations, near-syncopal syncope, and difficulty with hemodialysis".   - her Code Status was Full Code during last admission, even following family discussions with Palliative Care. In reviewing the ED Provider notes from 04/28/2015, the patient's daughter stated she wanted the patient to be DNR/DNI. This is most appropriate given the patient's worsening cardiac symptoms and neurologic issues.   3. CAD/ Ischemic Cardiomyopathy - s/p NSTEMI 04/2014 - medically managed at that time. - unable to obtain ROS to determine if any recent chest pain or anginal equilavents. - EKG appears similar to previous tracings. Troponin trending down when compared to values from previous admission.  4. ESRD - on HD (Monday,Wednesday, Friday schedule).  5. Acute encephalopathy - presented with a loss of consciousness and subsequent "altered mental status" compared to baseline per the patient's family. - CT Head shows no acute intracranial  abnormalities. - per admitting team   Signed, Erma Heritage, PA-C 04/30/2015, 8:22 AM Pager: [phone]

## 2015-04-30 NOTE — Discharge Instructions (Signed)
Syncope °Syncope means a person passes out (faints). The person usually wakes up in less than 5 minutes. It is important to seek medical care for syncope. °HOME CARE °· Have someone stay with you until you feel normal. °· Do not drive, use machines, or play sports until your doctor says it is okay. °· Keep all doctor visits as told. °· Lie down when you feel like you might pass out. Take deep breaths. Wait until you feel normal before standing up. °· Drink enough fluids to keep your pee (urine) clear or pale yellow. °· If you take blood pressure or heart medicine, get up slowly. Take several minutes to sit and then stand. °GET HELP RIGHT AWAY IF:  °· You have a severe headache. °· You have pain in the chest, belly (abdomen), or back. °· You are bleeding from the mouth or butt (rectum). °· You have black or tarry poop (stool). °· You have an irregular or very fast heartbeat. °· You have pain with breathing. °· You keep passing out, or you have shaking (seizures) when you pass out. °· You pass out when sitting or lying down. °· You feel confused. °· You have trouble walking. °· You have severe weakness. °· You have vision problems. °If you fainted, call for help (911 in [place] Do not drive yourself to the hospital. °  °This information is not intended to replace advice given to you by your health care provider. Make sure you discuss any questions you have with your health care provider. °  °Document Released: 08/23/2007 Document Revised: 07/21/2014 Document Reviewed: 05/05/2011 °Elsevier Interactive Patient Education ©2016 [employer] ° °

## 2015-04-30 NOTE — Progress Notes (Signed)
Clinical Social Worker was informed that patient will be medically ready to discharge to [facility]. Patient's daughter/ Guardian Randye Lobo is in a agreement with plan. CSW called [facility] to confirm that patient's bed is ready. Patient will return to her room before she was readmitted into the hosptial ([phone]). Call report to [phone] . All discharge information faxed to [facility]. DNR added to discharge packet.   Call to patient's daughter Vena Austria, to inform her patient would discharge to [facility]. RN will call report and patient will discharge to [facility] via [facility].   Woodroe Mode, MSW, [name]-A Clinical Social Work Department [phone]

## 2015-04-30 NOTE — Discharge Summary (Signed)
[facility] [place] at [facility]   PATIENT NAME: [name]    MR#:  [id number]  DATE OF BIRTH:  [birth year]  DATE OF ADMISSION:  04/28/2015 ADMITTING PHYSICIAN: Wyatt Haste, MD  DATE OF DISCHARGE: 04/30/2015  PRIMARY CARE PHYSICIAN: Amy Diana Eves, NP    ADMISSION DIAGNOSIS:  Altered mental status, unspecified altered mental status type [R41.82]  DISCHARGE DIAGNOSIS:  Active Problems:   Syncope can be due to hypoglycemia or due to advancing aortic stenosis with hypotension worsening her ability to stand and walk. Hypotension is going to occur with dialysis SECONDARY DIAGNOSIS:   Past Medical History  Diagnosis Date  . Stroke (HCC)   . MI (myocardial infarction) (HCC)   . Hearing impaired   . Hyperlipidemia   . Hypertension   . Diabetes mellitus without complication (HCC)   . Depression   . Coronary artery disease   . Dementia   . Secondary hyperparathyroidism (HCC)   . Chronic systolic CHF (congestive heart failure) (HCC)   . GERD (gastroesophageal reflux disease)   . Personal history of transient ischemic attack (TIA) and cerebral infarction without residual deficit   . Anemia   . Moderate aortic stenosis     a. echo 04/2014: 40-50%, moderate AS b. echo 01/2015: EF 35-40%, Severe AS, mild MR, moderate TR  . ESRD (end stage renal disease) (HCC)     a. Dialysis MWF    HOSPITAL COURSE:  70 y.o. female with a known history of end-stage renal disease hemodialysis, type 2 diabetes insulin requiring, dementia able to have a conversation at baseline , admitted with altered mental status. History obtained from daughter present at bedside. She states the patient was in normal state of health earlier the morning of admission.   She was Diaphoretic with low blood sugar was resolved after some orange juice, but patient underwent physical therapy after labs and the daughter was checking on her and noted that she was unresponsive. The episode lasted  apparently close to 30 minutes with waxing and waning mental status daughter states she is now closer to baseline.  Please see Dr. Jarrett Soho dictated history and physical for further details.  Patient was evaluated by neurology who did do not find any obvious etiology for her presentation.  Did not recommend changing any antiseizure medication.  She also had borderline elevated troponin, which was due to supply demand ischemia and no MI.  Syncope can be due to hypoglycemia or due to advancing aortic stenosis with hypotension worsening her ability to stand and walk.  She did undergo dialysis while in the hospital and been doing well.  She is being discharged home in stable condition.  DISCHARGE CONDITIONS:  stable  CONSULTS OBTAINED:  Treatment Team:  Wyatt Haste, MD Mady Haagensen, MD Kym Groom, MD Rollene Rotunda, MD  DRUG ALLERGIES:   Allergies  Allergen Reactions  . Contrast Media [Iodinated Diagnostic Agents] Anaphylaxis    DISCHARGE MEDICATIONS:   Current Discharge Medication List    CONTINUE these medications which have NOT CHANGED   Details  aspirin EC 81 MG tablet Take 81 mg by mouth daily.    atorvastatin (LIPITOR) 80 MG tablet Take 1 tablet (80 mg total) by mouth daily. Qty: 30 tablet, Refills: 1    bisacodyl (DULCOLAX) 10 MG suppository Place 1 suppository (10 mg total) rectally daily as needed for moderate constipation. Qty: 12 suppository, Refills: 0    calcium acetate (PHOSLO) 667 MG capsule Take 1,334 mg by mouth 3 (  three) times daily with meals.     cholecalciferol (VITAMIN D) 1000 units tablet Take 2,000 Units by mouth daily.    clopidogrel (PLAVIX) 75 MG tablet Take 1 tablet (75 mg total) by mouth daily. Qty: 30 tablet, Refills: 3    Cranberry 450 MG TABS Take 450 mg by mouth 2 (two) times daily.    docusate sodium (COLACE) 100 MG capsule Take 100 mg by mouth at bedtime.     insulin glargine (LANTUS) 100 UNIT/ML injection Inject 5 Units into the  skin daily.    insulin lispro (HUMALOG) 100 UNIT/ML injection Inject 0-9 Units into the skin 4 (four) times daily -  before meals and at bedtime. Pt uses as needed per sliding scale:    0-99:  0 units  100-149:  2 units  150-199:  3 units  200-249:  4 units  250-299:  5 units  300-349:  6 units 350-399:  7 units 400-449:  8 units 450-499:  9 units  500 or greater:  Call MD    iron polysaccharides (NIFEREX) 150 MG capsule Take 150 mg by mouth daily.    lactobacillus acidophilus (BACID) TABS tablet Take 1 tablet by mouth at bedtime.    !! levETIRAcetam (KEPPRA) 500 MG tablet Take 1 tablet (500 mg total) by mouth every Monday, Wednesday, and Friday at 8 PM. Qty: 30 tablet, Refills: 1    !! levETIRAcetam (KEPPRA) 750 MG tablet Take 1 tablet (750 mg total) by mouth daily. Qty: 30 tablet, Refills: 1    lidocaine-prilocaine (EMLA) cream Apply 1 application topically as needed (prior to dialysis on Monday, Wednesday, and Friday).    Nutritional Supplements (FEEDING SUPPLEMENT, NEPRO CARB STEADY,) LIQD Take 237 mLs by mouth 2 (two) times daily between meals. Qty: 60 Can, Refills: 0    ranitidine (ZANTAC) 150 MG tablet Take 1 tablet (150 mg total) by mouth at bedtime. Qty: 90 tablet, Refills: 3     !! - Potential duplicate medications found. Please discuss with provider.       DISCHARGE INSTRUCTIONS:    DIET:  Regular diet  DISCHARGE CONDITION:  Good  ACTIVITY:  Activity as tolerated  OXYGEN:  Home Oxygen: No.   Oxygen Delivery: room air  DISCHARGE LOCATION:  nursing home   If you experience worsening of your admission symptoms, develop shortness of breath, life threatening emergency, suicidal or homicidal thoughts you must seek medical attention immediately by calling 911 or calling your MD immediately  if symptoms less severe.  You Must read complete instructions/literature along with all the possible adverse reactions/side effects for all the Medicines you take  and that have been prescribed to you. Take any new Medicines after you have completely understood and accpet all the possible adverse reactions/side effects.   Please note  You were cared for by a hospitalist during your hospital stay. If you have any questions about your discharge medications or the care you received while you were in the hospital after you are discharged, you can call the unit and asked to speak with the hospitalist on call if the hospitalist that took care of you is not available. Once you are discharged, your primary care physician will handle any further medical issues. Please note that NO REFILLS for any discharge medications will be authorized once you are discharged, as it is imperative that you return to your primary care physician (or establish a relationship with a primary care physician if you do not have one) for your aftercare needs so  that they can reassess your need for medications and monitor your lab values.    On the day of Discharge:  VITAL SIGNS:  Blood pressure 136/49, pulse 95, temperature 97.7 F (36.5 C), temperature source Oral, resp. rate 16, height  (1.626 m), weight 64.229 kg (141 lb 9.6 oz), SpO2 98 %.  PHYSICAL EXAMINATION:  GENERAL:  70 y.o.-year-old patient lying in the bed with no acute distress.  EYES: Pupils equal, round, reactive to light and accommodation. No scleral icterus. Extraocular muscles intact.  HEENT: Head atraumatic, normocephalic. Oropharynx and nasopharynx clear.  NECK:  Supple, no jugular venous distention. No thyroid enlargement, no tenderness.  LUNGS: Normal breath sounds bilaterally, no wheezing, rales,rhonchi or crepitation. No use of accessory muscles of respiration.  CARDIOVASCULAR: S1, S2 normal. No murmurs, rubs, or gallops.  ABDOMEN: Soft, non-tender, non-distended. Bowel sounds present. No organomegaly or mass.  EXTREMITIES: No pedal edema, cyanosis, or clubbing.  NEUROLOGIC: Alert, oriented to name but not  place or time. Unable to provide history. Speech fluent without evidence of aphasia. Able to follow simple commands. PSYCHIATRIC: Not able to evaluate SKIN: No obvious rash, lesion, or ulcer.  DATA REVIEW:   CBC  Recent Labs Lab 04/29/15 0529  WBC 4.5  HGB 11.9*  HCT 37.3  PLT 237    Chemistries   Recent Labs Lab 04/28/15 1209 04/29/15 0529  NA 135 133*  K 4.8 5.0  CL 87* 89*  CO2 32 31  GLUCOSE 158* 217*  BUN 53* 62*  CREATININE 5.51* 6.36*  CALCIUM 9.5 9.3  AST 28  --   ALT 23  --   ALKPHOS 110  --   BILITOT 0.8  --     Cardiac Enzymes  Recent Labs Lab 04/30/15 0500  TROPONINI 0.49*    Microbiology Results  Results for orders placed or performed during the hospital encounter of 04/13/15  Culture, blood (routine x 2)     Status: None   Collection Time: 04/13/15  2:26 AM  Result Value Ref Range Status   Specimen Description BLOOD BLOOD RIGHT FOREARM  Final   Special Requests BOTTLES DRAWN AEROBIC AND ANAEROBIC  Final   Culture NO GROWTH 5 DAYS  Final   Report Status 04/18/2015 FINAL  Final  Culture, blood (routine x 2)     Status: None   Collection Time: 04/13/15  2:50 AM  Result Value Ref Range Status   Specimen Description BLOOD RIGHT HAND  Final   Special Requests BOTTLES DRAWN AEROBIC AND ANAEROBIC  Final   Culture NO GROWTH 5 DAYS  Final   Report Status 04/18/2015 FINAL  Final  MRSA PCR Screening     Status: None   Collection Time: 04/13/15  6:18 AM  Result Value Ref Range Status   MRSA by PCR NEGATIVE NEGATIVE Final    Comment:        The GeneXpert MRSA Assay (FDA approved for NASAL specimens only), is one component of a comprehensive MRSA colonization surveillance program. It is not intended to diagnose MRSA infection nor to guide or monitor treatment for MRSA infections.     RADIOLOGY:  Dg Chest 2 View  04/28/2015  CLINICAL DATA:  Possible syncopal episode, currently nonverbal which is a new finding, history of  seizures, previous CVA, coronary artery disease with MI, CHF. EXAM: CHEST  2 VIEW COMPARISON:  Portable chest x-ray of April 13, 2015 FINDINGS: The lungs are borderline hypoinflated. The pulmonary interstitial markings are coarse. The cardiac silhouette remains  enlarged. The pulmonary vascularity is mildly engorged. There is no pleural effusion or alveolar infiltrate high-grade wedge compression of T11 with milder compression of T10. IMPRESSION: Low-grade compensated CHF. There is no alveolar pneumonia nor pulmonary edema. Electronically Signed   By: David  [place] M.D.   On: 04/28/2015 13:41   Ct Head Wo Contrast  04/28/2015  CLINICAL DATA:  Patient presents to the ED via EMS from [facility]. Patient's daughter was visiting patient and states that patient was acting her normal self and patient lost consciousness. Per EMS, patient appeared unconscious on upon arrival bladder was alert with in light sternal rub. EXAM: CT HEAD WITHOUT CONTRAST TECHNIQUE: Contiguous axial images were obtained from the base of the skull through the vertex without intravenous contrast. COMPARISON:  None. FINDINGS: Remote infarction in the medial LEFT occipital and high parietal lobe again noted. No acute intracranial hemorrhage. No focal mass lesion. No CT evidence of acute infarction. No midline shift or mass effect. No hydrocephalus. Basilar cisterns are patent. There is generalized cortical atrophy and periventricular white matter hypodensities. Paranasal sinuses and  mastoid air cells are clear. IMPRESSION: 1. No acute intracranial findings. 2. Remote LEFT occipital infarction. 3. Extensive atrophy white matter microvascular disease not changed prior. Electronically Signed   By: Genevive Bi M.D.   On: 04/28/2015 12:46     Management plans discussed with the patient, family and they are in agreement.  She remains at very high risk for readmission  CODE STATUS: DO NOT RESUSCITATE  TOTAL TIME TAKING CARE OF THIS  PATIENT: 45 minutes.    [facility], [place] Kynard M.D on 04/30/2015 at 11:35 AM  Between 7am to 6pm - Pager - [phone]  After 6pm go to [email or web address] - password [email or web address] [facility]  [place] [facility]  Office  [phone]  CC: Primary care physician; Filbert Berthold, NP Jolene Provost, MD   Note: This dictation was prepared with Dragon dictation along with smaller phrase technology. Any transcriptional errors that result from this process are unintentional.

## 2015-04-30 NOTE — Progress Notes (Signed)
Inpatient Diabetes Program Recommendations  AACE/ADA: New Consensus Statement on Inpatient Glycemic Control (2015)  Target Ranges:  Prepandial:   less than 140 mg/dL      Peak postprandial:   less than 180 mg/dL (1-2 hours)      Critically ill patients:  140 - 180 mg/dL   Review of Glycemic Control  Results for [name], [name] (MRN [id number]) as of 04/30/2015 07:44  Ref. Range 04/29/2015 07:38 04/29/2015 11:23 04/29/2015 17:07 04/29/2015 20:38 04/30/2015 07:34  Glucose-Capillary Latest Ref Range: 65-99 mg/dL 742 (H) 595 (H) 638 (H) 283 (H) 183 (H)     Diabetes history: Type 2, A1C 6.7% on 04/28/15 Outpatient Diabetes medications: Lantus 5 units qday, Novolog sliding scale tid with meals Current orders for Inpatient glycemic control: Lantus 5 units qday, Novolog 0-9 units tid, Novolog 0-5 units qhs  Inpatient Diabetes Program Recommendations:     Consider increasing Lantus to 6 units and changing Novolog sensitive correction 0-9 units to q6h.    D/C Novolog 0-5 units at qhs.   If patient continues to be elevated over the weekend, consider adding 1-2 units of Novolog correction with Nepro.   Susette Racer, RN, BA, MHA, CDE Diabetes Coordinator Inpatient Diabetes Program  [phone] (Team Pager) [phone] [facility] Office) 04/30/2015 8:08 AM

## 2015-04-30 NOTE — Care Management (Signed)
For discharge  back to [facility] today.

## 2015-04-30 NOTE — Progress Notes (Signed)
[facility]  ROUNDING NOTE   Subjective:  Patient was verbalizing a bit this a.m. She is due for hemodialysis today.   Objective:  Vital signs in last 24 hours:  Temp:  [97.4 F (36.3 C)-97.7 F (36.5 C)] 97.7 F (36.5 C) (02/10 0402) Pulse Rate:  [80-98] 95 (02/10 0402) Resp:  [16-20] 16 (02/10 0402) BP: (117-136)/(49-78) 136/49 mmHg (02/10 0402) SpO2:  [94 %-100 %] 98 % (02/10 0402) Weight:  [64.229 kg (141 lb 9.6 oz)] 64.229 kg (141 lb 9.6 oz) (02/10 0402)  Weight change: -4.309 kg (-9 lb 8 oz) Filed Weights   04/28/15 1148 04/30/15 0402  Weight: 68.539 kg (151 lb 1.6 oz) 64.229 kg (141 lb 9.6 oz)    Intake/Output: I/O last 3 completed shifts: In: 103 [P.O.:100; I.V.:3] Out: 1000 [Other:1000]   Intake/Output this shift:  Total I/O In: 120 [P.O.:120] Out: -   Physical Exam: General: NAD, sitting up in bed  Head: Normocephalic, atraumatic. Moist oral mucosal membranes  Eyes: Anicteric, PERRL  Neck: Supple, trachea midline  Lungs:  Clear to auscultation  Heart: S1S2 no rubs  Abdomen:  Soft, nontender, BS present   Extremities: trace peripheral edema.  Neurologic: Awake, alert, followed simple commands  Skin: No lesions  Access: LUE AVG    Basic Metabolic Panel:  Recent Labs Lab 04/28/15 1209 04/29/15 0529 04/29/15 2001  NA 135 133*  --   K 4.8 5.0  --   CL 87* 89*  --   CO2 32 31  --   GLUCOSE 158* 217*  --   BUN 53* 62*  --   CREATININE 5.51* 6.36*  --   CALCIUM 9.5 9.3  --   PHOS  --   --  3.0    Liver Function Tests:  Recent Labs Lab 04/28/15 1209  AST 28  ALT 23  ALKPHOS 110  BILITOT 0.8  PROT 7.4  ALBUMIN 3.6   No results for input(s): LIPASE, AMYLASE in the last 168 hours. No results for input(s): AMMONIA in the last 168 hours.  CBC:  Recent Labs Lab 04/28/15 1209 04/29/15 0529  WBC 6.3 4.5  NEUTROABS 5.3  --   HGB 12.4 11.9*  HCT 38.8 37.3  MCV 96.7 96.6  PLT 259 237    Cardiac Enzymes:  Recent  Labs Lab 04/28/15 2322 04/29/15 0529 04/29/15 2001 04/30/15 0032 04/30/15 0500  TROPONINI 0.48* 0.59* 0.37* 0.37* 0.49*    BNP: Invalid input(s): POCBNP  CBG:  Recent Labs Lab 04/29/15 0738 04/29/15 1123 04/29/15 1707 04/29/15 2038 04/30/15 0734  GLUCAP 172* 295* 127* 283* 183*    Microbiology: Results for orders placed or performed during the hospital encounter of 04/13/15  Culture, blood (routine x 2)     Status: None   Collection Time: 04/13/15  2:26 AM  Result Value Ref Range Status   Specimen Description BLOOD BLOOD RIGHT FOREARM  Final   Special Requests BOTTLES DRAWN AEROBIC AND ANAEROBIC  Final   Culture NO GROWTH 5 DAYS  Final   Report Status 04/18/2015 FINAL  Final  Culture, blood (routine x 2)     Status: None   Collection Time: 04/13/15  2:50 AM  Result Value Ref Range Status   Specimen Description BLOOD RIGHT HAND  Final   Special Requests BOTTLES DRAWN AEROBIC AND ANAEROBIC  Final   Culture NO GROWTH 5 DAYS  Final   Report Status 04/18/2015 FINAL  Final  MRSA PCR Screening     Status:  None   Collection Time: 04/13/15  6:18 AM  Result Value Ref Range Status   MRSA by PCR NEGATIVE NEGATIVE Final    Comment:        The GeneXpert MRSA Assay (FDA approved for NASAL specimens only), is one component of a comprehensive MRSA colonization surveillance program. It is not intended to diagnose MRSA infection nor to guide or monitor treatment for MRSA infections.     Coagulation Studies:  Recent Labs  04/28/15 1209  LABPROT 14.2  INR 1.08    Urinalysis: No results for input(s): COLORURINE, LABSPEC, PHURINE, GLUCOSEU, HGBUR, BILIRUBINUR, KETONESUR, PROTEINUR, UROBILINOGEN, NITRITE, LEUKOCYTESUR in the last 72 hours.  Invalid input(s): APPERANCEUR    Imaging: Dg Chest 2 View  04/28/2015  CLINICAL DATA:  Possible syncopal episode, currently nonverbal which is a new finding, history of seizures, previous CVA, coronary artery disease with  MI, CHF. EXAM: CHEST  2 VIEW COMPARISON:  Portable chest x-ray of April 13, 2015 FINDINGS: The lungs are borderline hypoinflated. The pulmonary interstitial markings are coarse. The cardiac silhouette remains enlarged. The pulmonary vascularity is mildly engorged. There is no pleural effusion or alveolar infiltrate high-grade wedge compression of T11 with milder compression of T10. IMPRESSION: Low-grade compensated CHF. There is no alveolar pneumonia nor pulmonary edema. Electronically Signed   By: David  [place] M.D.   On: 04/28/2015 13:41   Ct Head Wo Contrast  04/28/2015  CLINICAL DATA:  Patient presents to the ED via EMS from [facility]. Patient's daughter was visiting patient and states that patient was acting her normal self and patient lost consciousness. Per EMS, patient appeared unconscious on upon arrival bladder was alert with in light sternal rub. EXAM: CT HEAD WITHOUT CONTRAST TECHNIQUE: Contiguous axial images were obtained from the base of the skull through the vertex without intravenous contrast. COMPARISON:  None. FINDINGS: Remote infarction in the medial LEFT occipital and high parietal lobe again noted. No acute intracranial hemorrhage. No focal mass lesion. No CT evidence of acute infarction. No midline shift or mass effect. No hydrocephalus. Basilar cisterns are patent. There is generalized cortical atrophy and periventricular white matter hypodensities. Paranasal sinuses and  mastoid air cells are clear. IMPRESSION: 1. No acute intracranial findings. 2. Remote LEFT occipital infarction. 3. Extensive atrophy white matter microvascular disease not changed prior. Electronically Signed   By: Genevive Bi M.D.   On: 04/28/2015 12:46     Medications:     . acidophilus  1 capsule Oral QHS  . aspirin EC  81 mg Oral Daily  . atorvastatin  80 mg Oral Daily  . calcium acetate  1,334 mg Oral TID WC  . cholecalciferol  2,000 Units Oral Daily  . clopidogrel  75 mg Oral Daily  .  docusate sodium  100 mg Oral QHS  . famotidine  20 mg Oral Daily  . feeding supplement (NEPRO CARB STEADY)  237 mL Oral BID BM  . heparin  5,000 Units Subcutaneous 3 times per day  . insulin aspart  0-5 Units Subcutaneous QHS  . insulin aspart  0-9 Units Subcutaneous TID WC  . insulin glargine  5 Units Subcutaneous Daily  . iron polysaccharides  150 mg Oral Daily  . levETIRAcetam  500 mg Oral Q M,W,F-2000  . levETIRAcetam  750 mg Oral Q24H  . sodium chloride flush  3 mL Intravenous Q12H   sodium chloride, sodium chloride, acetaminophen **OR** acetaminophen, alteplase, bisacodyl, heparin, lidocaine (PF), lidocaine-prilocaine, lidocaine-prilocaine, morphine injection, ondansetron **OR** ondansetron (ZOFRAN) IV, oxyCODONE,  pentafluoroprop-tetrafluoroeth  Assessment/ Plan:  70 y.o. 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   [facility] Nephrology MWF [facility] [address]   1. End-stage renal disease on hemodialysis:  Patient due for hemodialysis today per her usual schedule.  She also had dialysis yesterday.  2.  Altered mental status.  Seems improved at the moment.  She was following commands this a.m. And was conversant.  3.  Anemia chronic kidney disease.  Hemoglobin currently 11.9.  Hold off on erythropoietin stimulating agents.  4.  Secondary hyperparathyroidism. Continue PhosLo with meals for phosphorous control.  She will need continued monitoring of PTH and calcium as an outpatient.   LOS:  [name] 2/10/201711:06 AM

## 2015-05-01 LAB — PARATHYROID HORMONE, INTACT (NO CA): PTH: 250 pg/mL — AB (ref 15–65)

## 2015-05-01 NOTE — Progress Notes (Signed)
Patient cbg 56 on arrival to floor from HD. Juice given and cbg rechecked, still <70. Dr. Sherryll Burger notified and order for 1 amp of D50 received. Given to patient and cbg rechecked, cbg improved. Patient stable, asymptomatic. Trudee Kuster

## 2015-05-02 ENCOUNTER — Other Ambulatory Visit
Admission: RE | Admit: 2015-05-02 | Discharge: 2015-05-02 | Disposition: A | Discharge status: Home / Self Care | Payer: Medicare Other | Source: Ambulatory Visit | Attending: *Deleted | Admitting: *Deleted

## 2015-05-02 ENCOUNTER — Emergency Department: Payer: Medicare Other

## 2015-05-02 ENCOUNTER — Encounter: Payer: Self-pay | Admitting: Emergency Medicine

## 2015-05-02 ENCOUNTER — Inpatient Hospital Stay
Admission: EM | Admit: 2015-05-02 | Discharge: 2015-05-05 | DRG: 291 | Disposition: A | Discharge status: Skilled Nursing Facility | Payer: Medicare Other | Attending: Internal Medicine | Admitting: Internal Medicine

## 2015-05-02 DIAGNOSIS — R569 Unspecified convulsions: Secondary | ICD-10-CM

## 2015-05-02 DIAGNOSIS — E875 Hyperkalemia: Secondary | ICD-10-CM | POA: Diagnosis not present

## 2015-05-02 DIAGNOSIS — I214 Non-ST elevation (NSTEMI) myocardial infarction: Secondary | ICD-10-CM

## 2015-05-02 DIAGNOSIS — R410 Disorientation, unspecified: Secondary | ICD-10-CM | POA: Diagnosis present

## 2015-05-02 DIAGNOSIS — I132 Hypertensive heart and chronic kidney disease with heart failure and with stage 5 chronic kidney disease, or end stage renal disease: Secondary | ICD-10-CM | POA: Diagnosis not present

## 2015-05-02 DIAGNOSIS — R4182 Altered mental status, unspecified: Secondary | ICD-10-CM

## 2015-05-02 LAB — URINALYSIS COMPLETE WITH MICROSCOPIC (ARMC ONLY)
Bilirubin Urine: NEGATIVE
GLUCOSE, UA: 50 mg/dL — AB
Nitrite: NEGATIVE
Protein, ur: >500 mg/dL — AB
SPECIFIC GRAVITY, URINE: 1.02 (ref 1.005–1.030)
TRANS EPITHEL UA: 1
pH: 5 (ref 5.0–8.0)

## 2015-05-02 LAB — CBC WITH DIFFERENTIAL/PLATELET
BAND NEUTROPHILS: 0 %
BASOS ABS: 0 10*3/uL (ref 0–0.1)
BASOS PCT: 1 %
BLASTS: 0 %
EOS ABS: 0 10*3/uL (ref 0–0.7)
Eosinophils Relative: 0 %
HCT: 38.6 % (ref 35.0–47.0)
Hemoglobin: 12.5 g/dL (ref 12.0–16.0)
LYMPHS PCT: 16 %
Lymphs Abs: 0.7 10*3/uL — ABNORMAL LOW (ref 1.0–3.6)
MCH: 31.6 pg (ref 26.0–34.0)
MCHC: 32.4 g/dL (ref 32.0–36.0)
MCV: 97.6 fL (ref 80.0–100.0)
METAMYELOCYTES PCT: 0 %
MONO ABS: 0.5 10*3/uL (ref 0.2–0.9)
Monocytes Relative: 11 %
Myelocytes: 0 %
NEUTROS ABS: 3 10*3/uL (ref 1.4–6.5)
Neutrophils Relative %: 72 %
OTHER: 0 %
PLATELETS: 173 10*3/uL (ref 150–440)
PROMYELOCYTES ABS: 0 %
RBC: 3.95 MIL/uL (ref 3.80–5.20)
RDW: 22.7 % — ABNORMAL HIGH (ref 11.5–14.5)
WBC: 4.2 10*3/uL (ref 3.6–11.0)
nRBC: 9 /100 WBC — ABNORMAL HIGH

## 2015-05-02 LAB — BASIC METABOLIC PANEL
ANION GAP: 16 — AB (ref 5–15)
BUN: 54 mg/dL — AB (ref 6–20)
CALCIUM: 9.2 mg/dL (ref 8.9–10.3)
CO2: 23 mmol/L (ref 22–32)
Chloride: 97 mmol/L — ABNORMAL LOW (ref 101–111)
Creatinine, Ser: 5.21 mg/dL — ABNORMAL HIGH (ref 0.44–1.00)
GFR calc Af Amer: 9 mL/min — ABNORMAL LOW (ref >60)
GFR, EST NON AFRICAN AMERICAN: 8 mL/min — AB (ref >60)
GLUCOSE: 125 mg/dL — AB (ref 65–99)
Potassium: 6.5 mmol/L — ABNORMAL HIGH (ref 3.5–5.1)
Sodium: 136 mmol/L (ref 135–145)

## 2015-05-02 LAB — COMPREHENSIVE METABOLIC PANEL
ALBUMIN: 3.6 g/dL (ref 3.5–5.0)
ALT: 150 U/L — ABNORMAL HIGH (ref 14–54)
ANION GAP: 20 — AB (ref 5–15)
AST: 177 U/L — ABNORMAL HIGH (ref 15–41)
Alkaline Phosphatase: 227 U/L — ABNORMAL HIGH (ref 38–126)
BUN: 59 mg/dL — ABNORMAL HIGH (ref 6–20)
CHLORIDE: 92 mmol/L — AB (ref 101–111)
CO2: 26 mmol/L (ref 22–32)
CREATININE: 5.74 mg/dL — AB (ref 0.44–1.00)
Calcium: 9.8 mg/dL (ref 8.9–10.3)
GFR calc non Af Amer: 7 mL/min — ABNORMAL LOW (ref >60)
GFR, EST AFRICAN AMERICAN: 8 mL/min — AB (ref >60)
GLUCOSE: 209 mg/dL — AB (ref 65–99)
POTASSIUM: 6.3 mmol/L — AB (ref 3.5–5.1)
SODIUM: 138 mmol/L (ref 135–145)
Total Bilirubin: 1.2 mg/dL (ref 0.3–1.2)
Total Protein: 7.6 g/dL (ref 6.5–8.1)

## 2015-05-02 LAB — TROPONIN I: TROPONIN I: 1.5 ng/mL — AB (ref <0.031)

## 2015-05-02 LAB — TSH: TSH: 2.57 u[IU]/mL (ref 0.350–4.500)

## 2015-05-02 LAB — LIPASE, BLOOD: Lipase: 20 U/L (ref 11–51)

## 2015-05-02 LAB — AMMONIA: AMMONIA: 24 umol/L (ref 9–35)

## 2015-05-02 MED ORDER — SODIUM POLYSTYRENE SULFONATE 15 GM/60ML PO SUSP
15.0000 g | Freq: Once | ORAL | Status: AC
  Administered 2015-05-02: 15 g via RECTAL
  Filled 2015-05-02: qty 60

## 2015-05-02 MED ORDER — DEXTROSE 50 % IV SOLN
25.0000 mL | Freq: Once | INTRAVENOUS | Status: AC
  Administered 2015-05-02: 25 mL via INTRAVENOUS
  Filled 2015-05-02: qty 50

## 2015-05-02 MED ORDER — ALBUTEROL SULFATE (2.5 MG/3ML) 0.083% IN NEBU
2.5000 mg | INHALATION_SOLUTION | Freq: Once | RESPIRATORY_TRACT | Status: DC
  Filled 2015-05-02: qty 3

## 2015-05-02 MED ORDER — INSULIN ASPART 100 UNIT/ML ~~LOC~~ SOLN
10.0000 [IU] | Freq: Once | SUBCUTANEOUS | Status: AC
  Administered 2015-05-02: 10 [IU] via INTRAVENOUS
  Filled 2015-05-02: qty 10

## 2015-05-02 NOTE — ED Notes (Signed)
Patient presents to Emergency Department via EMS with complaints of unresponsive and per staff at [facility] and EMS AMS.  Same reports pt was independent 2 weeks ago and was here with same c/o on 2/8.  dialysis pt, shunt at left forearm.  Pt responsive to pain and movement.

## 2015-05-02 NOTE — ED Provider Notes (Addendum)
[facility] Emergency Department Provider Note  ____________________________________________  Time seen: Seen upon arrival to the emergency department  I have reviewed the triage vital signs and the nursing notes.   HISTORY  Chief Complaint Altered Mental Status    HPI [name] is a 70 y.o. female with a history of coronary artery disease, dementia and end-stage renal disease was presenting today with altered mental status. Per EMS the patient has been declining over the past 2 weeks with an acute decline over the past 2 days. The patient is intermittently responsive. Unknown baseline mental status per EMS. No reports of any pain. DO NOT RESUSCITATE is on the chart.  Patient discharged from the hospital just 2 days ago with a diagnosis of unspecified altered mental status. She did have a hypoglycemic episode that was documented on the chart.  Past Medical History  Diagnosis Date  . Stroke (HCC)   . MI (myocardial infarction) (HCC)   . Hearing impaired   . Hyperlipidemia   . Hypertension   . Diabetes mellitus without complication (HCC)   . Depression   . Coronary artery disease   . Dementia   . Secondary hyperparathyroidism (HCC)   . Chronic systolic CHF (congestive heart failure) (HCC)   . GERD (gastroesophageal reflux disease)   . Personal history of transient ischemic attack (TIA) and cerebral infarction without residual deficit   . Anemia   . Moderate aortic stenosis     a. echo 04/2014: 40-50%, moderate AS b. echo 01/2015: EF 35-40%, Severe AS, mild MR, moderate TR  . ESRD (end stage renal disease) (HCC)     a. Dialysis MWF    Patient Active Problem List   Diagnosis Date Noted  . Aortic valve disease   . Syncope 04/28/2015  . Malnutrition of moderate degree 04/15/2015  . Acute on chronic systolic CHF (congestive heart failure) (HCC) 04/13/2015  . Elevated troponin 04/13/2015  . Acute encephalopathy 04/13/2015  . CAD (coronary artery  disease) 04/13/2015  . GERD (gastroesophageal reflux disease) 04/13/2015  . Pressure ulcer 04/13/2015  . Altered mental status   . Aortic valve stenosis, severe   . Cardiomyopathy, ischemic   . Metabolic encephalopathy   . Respiratory failure (HCC)   . Dyspnea   . Severe aortic valve stenosis   . 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 NSTEMI (non-ST elevated myocardial infarction) (HCC)   . Cardiomyopathy, hypertensive, benign   . Angioedema 02/05/2015  . Acute respiratory failure (HCC) 02/05/2015  . Seizures (HCC) 12/02/2014  . Hyperlipidemia 08/11/2014  . End stage renal disease on dialysis (HCC) 05/12/2014  . Chronic systolic CHF (congestive heart failure) (HCC) 05/12/2014  . Secondary hypertension, unspecified 05/12/2014  . Type 2 diabetes mellitus with other circulatory complications (HCC) 05/12/2014  . Moderate aortic valve stenosis 05/12/2014    Past Surgical History  Procedure Laterality Date  . Cataract extraction, bilateral    . Wrist surgery    . Peripheral vascular catheterization Left 07/28/2014    Procedure: A/V Shuntogram/Fistulagram;  Surgeon: Renford Dills, MD;  Location: [facility] INVASIVE CV LAB;  Service: Cardiovascular;  Laterality: Left;  . Peripheral vascular catheterization Left 07/28/2014    Procedure: A/V Shunt Intervention;  Surgeon: Renford Dills, MD;  Location: [facility] INVASIVE CV LAB;  Service: Cardiovascular;  Laterality: Left;  . Peripheral vascular catheterization Left 02/04/2015    Procedure: A/V Shuntogram/Fistulagram;  Surgeon: Annice Needy, MD;  Location: [facility] INVASIVE CV LAB;  Service: Cardiovascular;  Laterality: Left;  . Peripheral vascular catheterization Left 02/04/2015    Procedure: A/V Shunt Intervention;  Surgeon: Annice Needy, MD;  Location: [facility] INVASIVE CV LAB;  Service: Cardiovascular;  Laterality: Left;    Current Outpatient Rx  Name  Route  Sig   Dispense  Refill  . aspirin EC 81 MG tablet   Oral   Take 81 mg by mouth daily.         [name] atorvastatin (LIPITOR) 80 MG tablet   Oral   Take 1 tablet (80 mg total) by mouth daily.   30 tablet   1   . bisacodyl (DULCOLAX) 10 MG suppository   Rectal   Place 1 suppository (10 mg total) rectally daily as needed for moderate constipation.   12 suppository   0   . calcium acetate (PHOSLO) 667 MG capsule   Oral   Take 1,334 mg by mouth 3 (three) times daily with meals.          . cholecalciferol (VITAMIN D) 1000 units tablet   Oral   Take 2,000 Units by mouth daily.         . clopidogrel (PLAVIX) 75 MG tablet   Oral   Take 1 tablet (75 mg total) by mouth daily.   30 tablet   3   . Cranberry 450 MG TABS   Oral   Take 450 mg by mouth 2 (two) times daily.         [name] docusate sodium (COLACE) 100 MG capsule   Oral   Take 100 mg by mouth at bedtime.          . insulin glargine (LANTUS) 100 UNIT/ML injection   Subcutaneous   Inject 5 Units into the skin daily.         . insulin lispro (HUMALOG) 100 UNIT/ML injection   Subcutaneous   Inject 0-9 Units into the skin 4 (four) times daily -  before meals and at bedtime. Pt uses as needed per sliding scale:    0-99:  0 units  100-149:  2 units  150-199:  3 units  200-249:  4 units  250-299:  5 units  300-349:  6 units 350-399:  7 units 400-449:  8 units 450-499:  9 units  500 or greater:  Call MD         . iron polysaccharides (NIFEREX) 150 MG capsule   Oral   Take 150 mg by mouth daily.         [name] lactobacillus acidophilus (BACID) TABS tablet   Oral   Take 1 tablet by mouth at bedtime.         . levETIRAcetam (KEPPRA) 500 MG tablet   Oral   Take 1 tablet (500 mg total) by mouth every Monday, Wednesday, and Friday at 8 PM.   30 tablet   1   . levETIRAcetam (KEPPRA) 750 MG tablet   Oral   Take 1 tablet (750 mg total) by mouth daily.   30 tablet   1   . lidocaine-prilocaine (EMLA) cream    Topical   Apply 1 application topically as needed (prior to dialysis on Monday, Wednesday, and Friday).         . Nutritional Supplements (FEEDING SUPPLEMENT, NEPRO CARB STEADY,) LIQD   Oral   Take 237 mLs by mouth 2 (two) times daily between meals.   60 Can   0   . ranitidine (ZANTAC) 150 MG  tablet   Oral   Take 1 tablet (150 mg total) by mouth at bedtime.   90 tablet   3     Allergies Contrast media  Family History  Problem Relation Age of Onset  . CAD Father     Social History Social History  Substance Use Topics  . Smoking status: Never Smoker   . Smokeless tobacco: None  . Alcohol Use: No    Review of Systems Caveat secondary to patient nonverbal  ____________________________________________   PHYSICAL EXAM:  VITAL SIGNS: ED Triage Vitals  Enc Vitals Group     BP 05/02/15 1930 135/83 mmHg     Pulse Rate 05/02/15 1930 91     Resp 05/02/15 1930 31     Temp 05/02/15 1941 97.6 F (36.4 C)     Temp Source 05/02/15 1941 Axillary     SpO2 05/02/15 1930 76 %     Weight 05/02/15 1941 155 lb (70.308 kg)     Height 05/02/15 1941 5\' 5"  (1.651 m)     Head Cir --      Peak Flow --      Pain Score 05/02/15 2104 Asleep     Pain Loc --      Pain Edu? --      Excl. in GC? --     Constitutional: Intermittently alert and follows some simple commands such as squeeze her hands. However is nonverbal. Eyes: Conjunctivae are normal. PERRL. EOMI. Head: Atraumatic. Nose: No congestion/rhinnorhea. Mouth/Throat: Mucous membranes are moist.  Oropharynx non-erythematous. Neck: No stridor.   Cardiovascular: Normal rate, regular rhythm. Grossly normal heart sounds.  Good peripheral circulation. Palpable thrill to the left forearm dialysis access. Respiratory: Normal respiratory effort.  No retractions. Lungs CTAB. Gastrointestinal: Soft and nontender. No distention. No abdominal bruits. No CVA tenderness. Musculoskeletal: No lower extremity tenderness nor edema.  No joint  effusions. Neurologic:  Normal speech and language. No gross focal neurologic deficits are appreciated. No gait instability. Skin:  Skin is warm, dry and intact. No rash noted. Psychiatric: Mood and affect are normal. Speech and behavior are normal.  ____________________________________________   LABS (all labs ordered are listed, but only abnormal results are displayed)  Labs Reviewed  CBC WITH DIFFERENTIAL/PLATELET - Abnormal; Notable for the following:    RDW 22.7 (*)    nRBC 9 (*)    Lymphs Abs 0.7 (*)    All other components within normal limits  COMPREHENSIVE METABOLIC PANEL - Abnormal; Notable for the following:    Potassium 6.3 (*)    Chloride 92 (*)    Glucose, Bld 209 (*)    BUN 59 (*)    Creatinine, Ser 5.74 (*)    AST 177 (*)    ALT 150 (*)    Alkaline Phosphatase 227 (*)    GFR calc non Af Amer 7 (*)    GFR calc Af Amer 8 (*)    Anion gap 20 (*)    All other components within normal limits  TROPONIN I - Abnormal; Notable for the following:    Troponin I 1.50 (*)    All other components within normal limits  LIPASE, BLOOD  AMMONIA  TSH  URINALYSIS COMPLETEWITH MICROSCOPIC ([facility] ONLY)  PATHOLOGIST SMEAR REVIEW   ____________________________________________  EKG  ED ECG REPORT I, Arelia Longest, the attending physician, personally viewed and interpreted this ECG.   Date: 05/02/2015  EKG Time: 1926  Rate: 89  Rhythm: normal sinus rhythm  Axis: Normal axis  Intervals:none  ST&T Change: T wave inversions in 1, 2, aVL, V5 and V6. Biphasic T waves in 3, aVF and V4. New T-wave changes in leads V4 through 6. ST depressions diffusely. No change in the depressions from previous. EKG used for comparison was from 02/18/2015.  ____________________________________________  RADIOLOGY  Cardiac enlargement with mild vascular congestion. No change from previous. old infarct in left occipital lobe. No acute intracranial  abnormalities. ____________________________________________   PROCEDURES  CRITICAL CARE Performed by: Arelia Longest   Total critical care time: 35 minutes  Critical care time was exclusive of separately billable procedures and treating other patients.  Critical care was necessary to treat or prevent imminent or life-threatening deterioration.  Critical care was time spent personally by me on the following activities: development of treatment plan with patient and/or surrogate as well as nursing, discussions with consultants, evaluation of patient's response to treatment, examination of patient, obtaining history from patient or surrogate, ordering and performing treatments and interventions, ordering and review of laboratory studies, ordering and review of radiographic studies, pulse oximetry and re-evaluation of patient's condition.  ____________________________________________   INITIAL IMPRESSION / ASSESSMENT AND PLAN / ED COURSE  Pertinent labs & imaging results that were available during my care of the patient were reviewed by me and considered in my medical decision making (see chart for details).  ----------------------------------------- 11:07 PM on 05/02/2015 -----------------------------------------  I had an extensive conversation with the patient's daughter, Vena Austria as well as her son-in-law, "Celine Mans" regarding the goals of the patient's care. I spoke with the daughter on the phone and she said that she would like to pursue medical intervention at this time but is reluctant to pursue any further measures such as procedures. The son-in-law then arrived in the emergency department. We discussed goals of care and he said that the top priority at this point would be acute patient "comfortable." We discussed pursuing hospice or comfort care, but he said that he would need to discuss further with the patient's daughter/his wife. At this time we will pursue medical treatment  with heparin as well as temporizing measures for the patient's potassium. I discussed the case Dr. Cherylann Ratel who is aware of the patient's admission status and he will follow as a consult. The patient remains at a stable mental status which is in and out of being alert. I discussed the medical treatments with the family and they are in agreement. Signed out to Dr. Anne Hahn. ____________________________________________   FINAL CLINICAL IMPRESSION(S) / ED DIAGNOSES  Altered mental status. Non-STEMI. Hyperkalemia.    Myrna Blazer, MD 05/02/15 2309  Patient also without any acute T-wave peaking on the EKG. Similar appearance to 02/18/2015.  Myrna Blazer, MD 05/02/15 [phone]

## 2015-05-02 NOTE — ED Notes (Signed)
TROPONIN: 1.50, Dr Pershing Proud notified

## 2015-05-03 ENCOUNTER — Inpatient Hospital Stay: Payer: Medicare Other

## 2015-05-03 DIAGNOSIS — H9190 Unspecified hearing loss, unspecified ear: Secondary | ICD-10-CM | POA: Diagnosis present

## 2015-05-03 DIAGNOSIS — D649 Anemia, unspecified: Secondary | ICD-10-CM | POA: Diagnosis present

## 2015-05-03 DIAGNOSIS — R29810 Facial weakness: Secondary | ICD-10-CM | POA: Diagnosis present

## 2015-05-03 DIAGNOSIS — E1122 Type 2 diabetes mellitus with diabetic chronic kidney disease: Secondary | ICD-10-CM | POA: Diagnosis present

## 2015-05-03 DIAGNOSIS — Z794 Long term (current) use of insulin: Secondary | ICD-10-CM | POA: Diagnosis not present

## 2015-05-03 DIAGNOSIS — Z7902 Long term (current) use of antithrombotics/antiplatelets: Secondary | ICD-10-CM | POA: Diagnosis not present

## 2015-05-03 DIAGNOSIS — R7989 Other specified abnormal findings of blood chemistry: Secondary | ICD-10-CM

## 2015-05-03 DIAGNOSIS — Z8249 Family history of ischemic heart disease and other diseases of the circulatory system: Secondary | ICD-10-CM | POA: Diagnosis not present

## 2015-05-03 DIAGNOSIS — I350 Nonrheumatic aortic (valve) stenosis: Secondary | ICD-10-CM | POA: Diagnosis present

## 2015-05-03 DIAGNOSIS — G40909 Epilepsy, unspecified, not intractable, without status epilepticus: Secondary | ICD-10-CM | POA: Diagnosis present

## 2015-05-03 DIAGNOSIS — N186 End stage renal disease: Secondary | ICD-10-CM | POA: Diagnosis present

## 2015-05-03 DIAGNOSIS — I5022 Chronic systolic (congestive) heart failure: Secondary | ICD-10-CM | POA: Diagnosis present

## 2015-05-03 DIAGNOSIS — Z66 Do not resuscitate: Secondary | ICD-10-CM | POA: Diagnosis present

## 2015-05-03 DIAGNOSIS — K219 Gastro-esophageal reflux disease without esophagitis: Secondary | ICD-10-CM | POA: Diagnosis present

## 2015-05-03 DIAGNOSIS — I132 Hypertensive heart and chronic kidney disease with heart failure and with stage 5 chronic kidney disease, or end stage renal disease: Secondary | ICD-10-CM | POA: Diagnosis present

## 2015-05-03 DIAGNOSIS — R55 Syncope and collapse: Secondary | ICD-10-CM | POA: Diagnosis present

## 2015-05-03 DIAGNOSIS — L89620 Pressure ulcer of left heel, unstageable: Secondary | ICD-10-CM | POA: Diagnosis present

## 2015-05-03 DIAGNOSIS — I2510 Atherosclerotic heart disease of native coronary artery without angina pectoris: Secondary | ICD-10-CM | POA: Diagnosis present

## 2015-05-03 DIAGNOSIS — G9340 Encephalopathy, unspecified: Secondary | ICD-10-CM | POA: Diagnosis present

## 2015-05-03 DIAGNOSIS — L89610 Pressure ulcer of right heel, unstageable: Secondary | ICD-10-CM | POA: Diagnosis present

## 2015-05-03 DIAGNOSIS — N2581 Secondary hyperparathyroidism of renal origin: Secondary | ICD-10-CM | POA: Diagnosis present

## 2015-05-03 DIAGNOSIS — R410 Disorientation, unspecified: Secondary | ICD-10-CM | POA: Diagnosis present

## 2015-05-03 DIAGNOSIS — E785 Hyperlipidemia, unspecified: Secondary | ICD-10-CM | POA: Diagnosis present

## 2015-05-03 DIAGNOSIS — I959 Hypotension, unspecified: Secondary | ICD-10-CM | POA: Diagnosis present

## 2015-05-03 DIAGNOSIS — Z992 Dependence on renal dialysis: Secondary | ICD-10-CM | POA: Diagnosis not present

## 2015-05-03 DIAGNOSIS — E11649 Type 2 diabetes mellitus with hypoglycemia without coma: Secondary | ICD-10-CM | POA: Diagnosis present

## 2015-05-03 DIAGNOSIS — F0390 Unspecified dementia without behavioral disturbance: Secondary | ICD-10-CM | POA: Diagnosis present

## 2015-05-03 DIAGNOSIS — I248 Other forms of acute ischemic heart disease: Secondary | ICD-10-CM | POA: Diagnosis present

## 2015-05-03 DIAGNOSIS — E875 Hyperkalemia: Secondary | ICD-10-CM | POA: Diagnosis present

## 2015-05-03 DIAGNOSIS — I252 Old myocardial infarction: Secondary | ICD-10-CM | POA: Diagnosis not present

## 2015-05-03 DIAGNOSIS — Z7982 Long term (current) use of aspirin: Secondary | ICD-10-CM | POA: Diagnosis not present

## 2015-05-03 DIAGNOSIS — I255 Ischemic cardiomyopathy: Secondary | ICD-10-CM | POA: Diagnosis present

## 2015-05-03 LAB — RENAL FUNCTION PANEL
Albumin: 3 g/dL — ABNORMAL LOW (ref 3.5–5.0)
Anion gap: 18 — ABNORMAL HIGH (ref 5–15)
BUN: 66 mg/dL — AB (ref 6–20)
CHLORIDE: 96 mmol/L — AB (ref 101–111)
CO2: 22 mmol/L (ref 22–32)
CREATININE: 6.62 mg/dL — AB (ref 0.44–1.00)
Calcium: 8.9 mg/dL (ref 8.9–10.3)
GFR calc Af Amer: 7 mL/min — ABNORMAL LOW (ref >60)
GFR, EST NON AFRICAN AMERICAN: 6 mL/min — AB (ref >60)
GLUCOSE: 133 mg/dL — AB (ref 65–99)
Phosphorus: 4.4 mg/dL (ref 2.5–4.6)
Potassium: 6.3 mmol/L — ABNORMAL HIGH (ref 3.5–5.1)
Sodium: 136 mmol/L (ref 135–145)

## 2015-05-03 LAB — CBC
HCT: 33.4 % — ABNORMAL LOW (ref 35.0–47.0)
HEMOGLOBIN: 10.9 g/dL — AB (ref 12.0–16.0)
MCH: 31.1 pg (ref 26.0–34.0)
MCHC: 32.7 g/dL (ref 32.0–36.0)
MCV: 95.1 fL (ref 80.0–100.0)
PLATELETS: 173 10*3/uL (ref 150–440)
RBC: 3.51 MIL/uL — AB (ref 3.80–5.20)
RDW: 21.8 % — ABNORMAL HIGH (ref 11.5–14.5)
WBC: 4 10*3/uL (ref 3.6–11.0)

## 2015-05-03 LAB — GLUCOSE, CAPILLARY
Glucose-Capillary: 136 mg/dL — ABNORMAL HIGH (ref 65–99)
Glucose-Capillary: 122 mg/dL — ABNORMAL HIGH (ref 65–99)
Glucose-Capillary: 136 mg/dL — ABNORMAL HIGH (ref 65–99)

## 2015-05-03 LAB — APTT: APTT: 32 s (ref 24–36)

## 2015-05-03 LAB — TROPONIN I
TROPONIN I: 1.2 ng/mL — AB (ref <0.031)
TROPONIN I: 1.05 ng/mL — AB (ref <0.031)

## 2015-05-03 LAB — TSH: TSH: 1.688 u[IU]/mL (ref 0.350–4.500)

## 2015-05-03 LAB — HEMOGLOBIN A1C: Hgb A1c MFr Bld: 6.4 % — ABNORMAL HIGH (ref 4.0–6.0)

## 2015-05-03 LAB — PROTIME-INR
INR: 1.5
Prothrombin Time: 18.2 seconds — ABNORMAL HIGH (ref 11.4–15.0)

## 2015-05-03 MED ORDER — ONDANSETRON HCL 4 MG/2ML IJ SOLN: 4.0000 mg | Freq: Four times a day (QID) | INTRAMUSCULAR | Status: DC | PRN

## 2015-05-03 MED ORDER — ONDANSETRON HCL 4 MG PO TABS: 4.0000 mg | ORAL_TABLET | Freq: Four times a day (QID) | ORAL | Status: DC | PRN

## 2015-05-03 MED ORDER — CRANBERRY 450 MG PO TABS: 450.0000 mg | ORAL_TABLET | Freq: Two times a day (BID) | ORAL | Status: DC

## 2015-05-03 MED ORDER — FAMOTIDINE 20 MG PO TABS
20.0000 mg | ORAL_TABLET | Freq: Every day | ORAL | Status: DC
  Administered 2015-05-04: 20 mg via ORAL
  Filled 2015-05-03 (×2): qty 1

## 2015-05-03 MED ORDER — LEVETIRACETAM 750 MG PO TABS
750.0000 mg | ORAL_TABLET | ORAL | Status: DC
  Administered 2015-05-04 – 2015-05-05 (×2): 750 mg via ORAL
  Filled 2015-05-03 (×2): qty 1

## 2015-05-03 MED ORDER — ACETAMINOPHEN 325 MG PO TABS: 650.0000 mg | ORAL_TABLET | Freq: Four times a day (QID) | ORAL | Status: DC | PRN

## 2015-05-03 MED ORDER — HEPARIN BOLUS VIA INFUSION
4000.0000 [IU] | Freq: Once | INTRAVENOUS | Status: AC
  Administered 2015-05-03: 4000 [IU] via INTRAVENOUS
  Filled 2015-05-03: qty 4000

## 2015-05-03 MED ORDER — NEPRO/CARBSTEADY PO LIQD
237.0000 mL | Freq: Two times a day (BID) | ORAL | Status: DC
  Administered 2015-05-04 – 2015-05-05 (×2): 237 mL via ORAL

## 2015-05-03 MED ORDER — RISAQUAD PO CAPS
1.0000 | ORAL_CAPSULE | Freq: Every day | ORAL | Status: DC
  Administered 2015-05-04 – 2015-05-05 (×2): 1 via ORAL
  Filled 2015-05-03 (×2): qty 1

## 2015-05-03 MED ORDER — HEPARIN (PORCINE) IN NACL 100-0.45 UNIT/ML-% IJ SOLN
1000.0000 [IU]/h | INTRAMUSCULAR | Status: DC
  Administered 2015-05-03: 850 [IU]/h via INTRAVENOUS
  Administered 2015-05-04: 1000 [IU]/h via INTRAVENOUS
  Filled 2015-05-03 (×3): qty 250

## 2015-05-03 MED ORDER — LEVETIRACETAM 500 MG PO TABS
500.0000 mg | ORAL_TABLET | ORAL | Status: DC
  Administered 2015-05-03: 500 mg via ORAL
  Filled 2015-05-03: qty 1

## 2015-05-03 MED ORDER — ASPIRIN EC 81 MG PO TBEC
81.0000 mg | DELAYED_RELEASE_TABLET | Freq: Every day | ORAL | Status: DC
  Administered 2015-05-04 – 2015-05-05 (×2): 81 mg via ORAL
  Filled 2015-05-03 (×2): qty 1

## 2015-05-03 MED ORDER — CLOPIDOGREL BISULFATE 75 MG PO TABS
75.0000 mg | ORAL_TABLET | Freq: Every day | ORAL | Status: DC
  Administered 2015-05-04 – 2015-05-05 (×2): 75 mg via ORAL
  Filled 2015-05-03 (×2): qty 1

## 2015-05-03 MED ORDER — DOCUSATE SODIUM 100 MG PO CAPS
100.0000 mg | ORAL_CAPSULE | Freq: Every day | ORAL | Status: DC
  Filled 2015-05-03: qty 1

## 2015-05-03 MED ORDER — INSULIN ASPART 100 UNIT/ML ~~LOC~~ SOLN: 0.0000 [IU] | Freq: Every day | SUBCUTANEOUS | Status: DC

## 2015-05-03 MED ORDER — MORPHINE SULFATE (PF) 2 MG/ML IV SOLN: 2.0000 mg | INTRAVENOUS | Status: DC | PRN

## 2015-05-03 MED ORDER — ATORVASTATIN CALCIUM 20 MG PO TABS
80.0000 mg | ORAL_TABLET | Freq: Every day | ORAL | Status: DC
  Administered 2015-05-04 – 2015-05-05 (×2): 80 mg via ORAL
  Filled 2015-05-03 (×2): qty 4

## 2015-05-03 MED ORDER — BISACODYL 10 MG RE SUPP: 10.0000 mg | Freq: Every day | RECTAL | Status: DC | PRN

## 2015-05-03 MED ORDER — POLYSACCHARIDE IRON COMPLEX 150 MG PO CAPS
150.0000 mg | ORAL_CAPSULE | Freq: Every day | ORAL | Status: DC
  Administered 2015-05-04 – 2015-05-05 (×2): 150 mg via ORAL
  Filled 2015-05-03 (×4): qty 1

## 2015-05-03 MED ORDER — ACETAMINOPHEN 650 MG RE SUPP: 650.0000 mg | Freq: Four times a day (QID) | RECTAL | Status: DC | PRN

## 2015-05-03 MED ORDER — INSULIN ASPART 100 UNIT/ML ~~LOC~~ SOLN
0.0000 [IU] | Freq: Three times a day (TID) | SUBCUTANEOUS | Status: DC
  Administered 2015-05-03: 1 [IU] via SUBCUTANEOUS
  Administered 2015-05-04: 3 [IU] via SUBCUTANEOUS
  Administered 2015-05-05: 2 [IU] via SUBCUTANEOUS
  Administered 2015-05-05: 3 [IU] via SUBCUTANEOUS
  Filled 2015-05-03: qty 1
  Filled 2015-05-03: qty 2
  Filled 2015-05-03: qty 1
  Filled 2015-05-03 (×2): qty 3

## 2015-05-03 MED ORDER — LIDOCAINE-PRILOCAINE 2.5-2.5 % EX CREA: 1.0000 "application " | TOPICAL_CREAM | CUTANEOUS | Status: DC | PRN

## 2015-05-03 MED ORDER — VITAMIN D 1000 UNITS PO TABS
2000.0000 [IU] | ORAL_TABLET | Freq: Every day | ORAL | Status: DC
  Administered 2015-05-04 – 2015-05-05 (×2): 2000 [IU] via ORAL
  Filled 2015-05-03 (×2): qty 2

## 2015-05-03 MED ORDER — CALCIUM ACETATE (PHOS BINDER) 667 MG PO CAPS
1334.0000 mg | ORAL_CAPSULE | Freq: Three times a day (TID) | ORAL | Status: DC
  Administered 2015-05-05 (×2): 1334 mg via ORAL
  Filled 2015-05-03 (×3): qty 2

## 2015-05-03 NOTE — Progress Notes (Signed)
  Received handoff report from Aiken D. RN, 1100, pt had already been taken down to MRI.    Restarted heparin drip , upon return to unit. Pt resting in bed VSS.

## 2015-05-03 NOTE — Progress Notes (Signed)
ANTICOAGULATION CONSULT NOTE - Follow up Consult  Pharmacy Consult for heparin drip Indication: ACS/STEMI  Allergies  Allergen Reactions  . Contrast Media [Iodinated Diagnostic Agents] Anaphylaxis    Patient Measurements: Height:  (160 cm) Weight: 142 lb (64.411 kg) IBW/kg (Calculated) : 52.4 Heparin Dosing Weight: 70.3kg  Vital Signs: Temp: 97.3 F (36.3 C) (02/13 0605) Temp Source: Axillary (02/13 0605) BP: 119/76 mmHg (02/13 1430) Pulse Rate: 72 (02/13 1500)  Labs:  Recent Labs  05/02/15 1040 05/02/15 1933 05/03/15 0018 05/03/15 0747  HGB  --  12.5  --   --   HCT  --  38.6  --   --   PLT  --  173  --   --   APTT  --   --  32  --   LABPROT  --   --  18.2*  --   INR  --   --  1.50  --   CREATININE 5.21* 5.74*  --   --   TROPONINI  --  1.50*  --  1.20*    Estimated Creatinine Clearance: 8.4 mL/min (by C-G formula based on Cr of 5.74).   Assessment: 70 yo female here with elevated troponins.   Hgb 12.5  plt 173 INR 1.5  aPTT 32  Goal of Therapy:  Heparin level 0.3-0.7 units/ml Monitor platelets by anticoagulation protocol: Yes   Plan:  4000 unit bolus and initial rate of 850 units/hr. First heparin level 8 hours after start of infusion.  2/13: Heparin level was due at 1100 but not drawn. Apparently heparin drip was turned off for MRI at ~ 1030 AM per RN report and then pt went straight to dialysis without turning the heparin drip back on. Heparin had been running at 8.5 ml/hr with no bleeding or line issues noted by AM RN. Will d/c heparin level at this time. Per Dr. Juliene Pina, waiting on cards to see pt, continue heparin drip for now. Asked RN to see if someone can go to the HD unit and restart heparin drip as pt still has about another 3h left of dialysis (RN stated she would call back when restarted).   Change in pt's wt, will adjust heparin drip to 800 units/hr (=8 ml/hr). CBC today when labs drawn.    Addendum: Hadn't heard from floor RN yet and so  called her. Per RN had not had a chance to restart heparin drip in this pt in HD with elevated troponin. Informed RN to call pharmacy when heparin drip restarted.  CBC still pending.   Marty Heck 05/03/2015,3:32 PM

## 2015-05-03 NOTE — Consult Note (Signed)
Cardiology Consultation Note  Patient ID: [name], MRN: [id number], DOB/AGE: [birth year] 70 y.o. Admit date: 05/02/2015   Date of Consult: 05/03/2015 Primary Physician: Leata Mouse, NP Primary Cardiologist: Dr. Rockey Situ, MD  Chief Complaint: AMS Reason for Consult: Elevated troponin  HPI: 70 y.o. female with h/o CAD (s/p NSTEMI 04/2014 - medically managed), chronic systolic CHF (EF 21-19% by echo in 01/2015), severe aortic stenosis, dementia, ESRD (on HD M/W/F), CVA, Type 2 DM, HTN, HLD, Seizure Disorder and chronic anemia who has been admitted to [facility] multiple times recently for AMS presented again  On 2/13 for the same.    She was recently admitted in early December for AMS and acute respiratory failure. Troponin peaked at 3.26. Recent echo in November showed an EF of 35-40%, severe AS with valve area (VTI) 0.44, valve area (Vmax) 0.39, valve area (Vmean) 0.41. She was note felt to be a candidate for invasive procedure at that time and medical management was advised.   She was admitted again from 04/13/2015 - 04/21/2015 with acute encephalopathy. Cardiology was consulted during that admission for her recurrent syncope and elevated troponin. She has severe aortic stenosis and has not been deemed a good surgical candidate for aortic valve replacement. At that time per Dr. Donivan Scull note, "without aortic valve surgery, the patient's symptoms will continue to progress with worsening cardiac failure, shortness of breath, troponin elevations, near-syncopal syncope, and difficulty with hemodialysis". During that admission, the patient was still Full Code. Palliative Care met with the patient's daughter last hospitalization and the daughter wanted her mom to go to a SNF for PT and continued rehabilitation. Code status was also addressed and she was to remain a Full Code at time even after review of her severe aortic stenosis and poor prognosis. Her troponin peaked at 7.70. Medical management was still advised  given she multiple comorbidities.   She was again admitted for a third time on 2/8 for AMS/LOC. She was found to be hypoglycemic with blood sugar in the 30s. The patient's daughter stated she wanted the patient to be DNR/DNI. Her troponin was mildly elevated at 0.62. Medical management was again advised. She was discharged home.  She presented for a forth time on 2/12 with AMS. History is taken from the H&P as patient is nonverbal and demented. Apparently, the patient became nonverbal and somnolent earlier in the week prompting her to bring her in for evaluation. She had missed some HD sessions since her discharge. She was found to have an elevated troponin in the ED at 1.50-->1.20. She has been restarted on HD today. CT head non acute. MRI brain non acute. CXR mild congestion.     Past Medical History  Diagnosis Date  . Stroke ([place])   . MI (myocardial infarction) ([place])   . Hearing impaired   . Hyperlipidemia   . Hypertension   . Diabetes mellitus without complication ([place])   . Depression   . Coronary artery disease   . Dementia   . Secondary hyperparathyroidism ([place])   . Chronic systolic CHF (congestive heart failure) ([place])   . GERD (gastroesophageal reflux disease)   . Personal history of transient ischemic attack (TIA) and cerebral infarction without residual deficit   . Anemia   . Moderate aortic stenosis     a. echo 04/2014: 40-50%, moderate AS b. echo 01/2015: EF 35-40%, Severe AS, mild MR, moderate TR  . ESRD (end stage renal disease) ([place])     a. Dialysis MWF  Most Recent Cardiac Studies: As above   Surgical History:  Past Surgical History  Procedure Laterality Date  . Cataract extraction, bilateral    . Wrist surgery    . Peripheral vascular catheterization Left 07/28/2014    Procedure: A/V Shuntogram/Fistulagram;  Surgeon: Katha Cabal, MD;  Location: [place] [facility];  Service: Cardiovascular;  Laterality: Left;  . Peripheral vascular catheterization Left  07/28/2014    Procedure: A/V Shunt Intervention;  Surgeon: Katha Cabal, MD;  Location: [place] [facility];  Service: Cardiovascular;  Laterality: Left;  . Peripheral vascular catheterization Left 02/04/2015    Procedure: A/V Shuntogram/Fistulagram;  Surgeon: Algernon Huxley, MD;  Location: [place] [facility];  Service: Cardiovascular;  Laterality: Left;  . Peripheral vascular catheterization Left 02/04/2015    Procedure: A/V Shunt Intervention;  Surgeon: Algernon Huxley, MD;  Location: [place] [facility];  Service: Cardiovascular;  Laterality: Left;     Home Meds: Prior to Admission medications   Medication Sig Start Date End Date Taking? Authorizing Provider  aspirin EC 81 MG tablet Take 81 mg by mouth daily.   Yes Historical Provider, MD  atorvastatin (LIPITOR) 80 MG tablet Take 1 tablet (80 mg total) by mouth daily. 04/20/15  Yes Demetrios Loll, MD  bisacodyl (DULCOLAX) 10 MG suppository Place 1 suppository (10 mg total) rectally daily as needed for moderate constipation. 02/23/15  Yes Nicholes Mango, MD  calcium acetate (PHOSLO) 667 MG capsule Take 1,334 mg by mouth 3 (three) times daily with meals.    Yes Historical Provider, MD  cholecalciferol (VITAMIN D) 1000 units tablet Take 2,000 Units by mouth daily.   Yes Historical Provider, MD  clopidogrel (PLAVIX) 75 MG tablet Take 1 tablet (75 mg total) by mouth daily. 09/07/14  Yes Minna Merritts, MD  Cranberry 450 MG TABS Take 450 mg by mouth 2 (two) times daily.   Yes Historical Provider, MD  docusate sodium (COLACE) 100 MG capsule Take 100 mg by mouth at bedtime.    Yes Historical Provider, MD  insulin glargine (LANTUS) 100 UNIT/ML injection Inject 5 Units into the skin daily.   Yes Historical Provider, MD  insulin lispro (HUMALOG) 100 UNIT/ML injection Inject 0-9 Units into the skin 4 (four) times daily -  before meals and at bedtime. Pt uses as needed per sliding scale:    0-99:  0 units  100-149:  2 units  150-199:  3 units  200-249:  4  units  250-299:  5 units  300-349:  6 units 350-399:  7 units 400-449:  8 units 450-499:  9 units  500 or greater:  Call MD   Yes Historical Provider, MD  iron polysaccharides (NIFEREX) 150 MG capsule Take 150 mg by mouth daily.   Yes Historical Provider, MD  lactobacillus acidophilus (BACID) TABS tablet Take 1 tablet by mouth daily.    Yes Historical Provider, MD  levETIRAcetam (KEPPRA) 500 MG tablet Take 1 tablet (500 mg total) by mouth every Monday, Wednesday, and Friday at 8 PM. 04/20/15  Yes Demetrios Loll, MD  levETIRAcetam (KEPPRA) 750 MG tablet Take 1 tablet (750 mg total) by mouth daily. 04/20/15  Yes Demetrios Loll, MD  lidocaine-prilocaine (EMLA) cream Apply 1 application topically as needed (prior to dialysis on Monday, Wednesday, and Friday).   Yes Historical Provider, MD  Nutritional Supplements (FEEDING SUPPLEMENT, NEPRO CARB STEADY,) LIQD Take 237 mLs by mouth 2 (two) times daily between meals. 12/05/14  Yes Aldean Jewett, MD  ranitidine Keturah Shavers)  150 MG tablet Take 1 tablet (150 mg total) by mouth at bedtime. 02/16/15  Yes Amy Overton Mam, NP    Inpatient Medications:  . acidophilus  1 capsule Oral Daily  . albuterol  2.5 mg Nebulization Once  . aspirin EC  81 mg Oral Daily  . atorvastatin  80 mg Oral Daily  . calcium acetate  1,334 mg Oral TID WC  . cholecalciferol  2,000 Units Oral Daily  . clopidogrel  75 mg Oral Daily  . docusate sodium  100 mg Oral QHS  . famotidine  20 mg Oral QHS  . feeding supplement (NEPRO CARB STEADY)  237 mL Oral BID BM  . insulin aspart  0-5 Units Subcutaneous QHS  . insulin aspart  0-9 Units Subcutaneous TID WC  . iron polysaccharides  150 mg Oral Daily  . levETIRAcetam  500 mg Oral Q M,W,F-2000  . levETIRAcetam  750 mg Oral Q24H   . heparin 850 Units/hr (05/03/15 0329)    Allergies:  Allergies  Allergen Reactions  . Contrast Media [Iodinated Diagnostic Agents] Anaphylaxis    Social History   Social History  . Marital Status: Widowed      Spouse Name: N/A  . Number of Children: N/A  . Years of Education: N/A   Occupational History  . Not on file.   Social History Main Topics  . Smoking status: Never Smoker   . Smokeless tobacco: Not on file  . Alcohol Use: No  . Drug Use: No  . Sexual Activity: Not on file   Other Topics Concern  . Not on file   Social History Narrative     Family History  Problem Relation Age of Onset  . CAD Father      Review of Systems: Review of Systems  Unable to perform ROS: dementia    Labs:  Recent Labs  05/02/15 1933 05/03/15 0747  TROPONINI 1.50* 1.20*   Lab Results  Component Value Date   WBC 4.2 05/02/2015   HGB 12.5 05/02/2015   HCT 38.6 05/02/2015   MCV 97.6 05/02/2015   PLT 173 05/02/2015    Recent Labs Lab 05/02/15 1933  NA 138  K 6.3*  CL 92*  CO2 26  BUN 59*  CREATININE 5.74*  CALCIUM 9.8  PROT 7.6  BILITOT 1.2  ALKPHOS 227*  ALT 150*  AST 177*  GLUCOSE 209*   Lab Results  Component Value Date   CHOL 256* 04/22/2014   HDL 73* 04/22/2014   LDLCALC 179* 04/22/2014   TRIG 32 02/07/2015   No results found for: DDIMER  Radiology/Studies:  Dg Chest 1 View  05/02/2015  CLINICAL DATA:  Unresponsive patient. Altered mental status. History of stroke, MI, hypertension, diabetes, coronary artery disease, dementia, GERD, CHF, renal disease, previous cardiac catheterization. EXAM: CHEST 1 VIEW COMPARISON:  04/28/2015 FINDINGS: Shallow inspiration. Cardiac enlargement with mild vascular congestion similar to previous study. Linear fibrosis or atelectasis in the left mid lungs. No developing consolidation. No blunting of costophrenic angles. No pneumothorax. Calcified and tortuous aorta. IMPRESSION: Cardiac enlargement with mild vascular congestion. No change since previous study. Electronically Signed   By: Lucienne Capers M.D.   On: 05/02/2015 21:09   Dg Chest 2 View  04/28/2015  CLINICAL DATA:  Possible syncopal episode, currently nonverbal which is  a new finding, history of seizures, previous CVA, coronary artery disease with MI, CHF. EXAM: CHEST  2 VIEW COMPARISON:  Portable chest x-ray of April 13, 2015 FINDINGS: The lungs are borderline  hypoinflated. The pulmonary interstitial markings are coarse. The cardiac silhouette remains enlarged. The pulmonary vascularity is mildly engorged. There is no pleural effusion or alveolar infiltrate high-grade wedge compression of T11 with milder compression of T10. IMPRESSION: Low-grade compensated CHF. There is no alveolar pneumonia nor pulmonary edema. Electronically Signed   By: David  Martinique M.D.   On: 04/28/2015 13:41   Ct Head Wo Contrast  05/02/2015  CLINICAL DATA:  Unresponsive patient. Altered mental status. Patient was here with similar complaints on 05/18/2015. EXAM: CT HEAD WITHOUT CONTRAST TECHNIQUE: Contiguous axial images were obtained from the base of the skull through the vertex without intravenous contrast. COMPARISON:  04/28/2015 FINDINGS: Examination is technically limited due to motion artifact. Encephalomalacia in the left occipital lobe consistent with old infarct. Mild diffuse cerebral atrophy. Ventricular dilatation consistent with central atrophy. Low-attenuation changes throughout the deep white matter consistent with small vessel ischemia. Old lacunar infarcts in the basal ganglia. No mass effect or midline shift. No abnormal extra-axial fluid collections. Gray-white matter junctions are distinct. Basal cisterns are not effaced. No evidence of acute intracranial hemorrhage. No depressed skull fractures. Visualized paranasal sinuses and mastoid air cells are not opacified. Prominent vascular calcifications. IMPRESSION: Old infarct in the left occipital lobe. Chronic atrophy and small vessel ischemic changes. No acute intracranial abnormalities. Unchanged appearance since previous study. Electronically Signed   By: Lucienne Capers M.D.   On: 05/02/2015 21:12   Ct Head Wo  Contrast  04/28/2015  CLINICAL DATA:  Patient presents to the ED via EMS from [facility]. Patient's daughter was visiting patient and states that patient was acting her normal self and patient lost consciousness. Per EMS, patient appeared unconscious on upon arrival bladder was alert with in light sternal rub. EXAM: CT HEAD WITHOUT CONTRAST TECHNIQUE: Contiguous axial images were obtained from the base of the skull through the vertex without intravenous contrast. COMPARISON:  None. FINDINGS: Remote infarction in the medial LEFT occipital and high parietal lobe again noted. No acute intracranial hemorrhage. No focal mass lesion. No CT evidence of acute infarction. No midline shift or mass effect. No hydrocephalus. Basilar cisterns are patent. There is generalized cortical atrophy and periventricular white matter hypodensities. Paranasal sinuses and  mastoid air cells are clear. IMPRESSION: 1. No acute intracranial findings. 2. Remote LEFT occipital infarction. 3. Extensive atrophy white matter microvascular disease not changed prior. Electronically Signed   By: Suzy Bouchard M.D.   On: 04/28/2015 12:46   Ct Head Wo Contrast  04/13/2015  CLINICAL DATA:  Decreased level of consciousness. Unresponsive. Patient was laying on couch and woke family with loud snoring. EXAM: CT HEAD WITHOUT CONTRAST TECHNIQUE: Contiguous axial images were obtained from the base of the skull through the vertex without intravenous contrast. COMPARISON:  02/28/2015 FINDINGS: Diffuse cerebral atrophy. Ventricular dilatation consistent with central atrophy. Low-attenuation changes throughout the deep white matter consistent with small vessel ischemia. Old lacunar infarcts in the basal ganglia and thalamus bilaterally. Focal area of old encephalomalacia in the left posterior parietal and occipital lobes consistent with old infarcts. No significant change since prior study. No abnormal extra-axial fluid collections. Gray-white matter  junctions are distinct. Basal cisterns are not effaced. No acute intracranial hemorrhage. Calvarium appears intact. Visualized paranasal sinuses and mastoid air cells are not opacified. Vascular calcifications. IMPRESSION: No acute intracranial abnormalities. Chronic atrophy and small vessel ischemic changes. 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 Electronically Signed   By: Lucienne Capers M.D.   On: 04/13/2015  01:44   Mr Brain Wo Contrast  05/03/2015  CLINICAL DATA:  Acute presentation with altered mental status yesterday. Somnolent. Unresponsive. EXAM: MRI HEAD WITHOUT CONTRAST TECHNIQUE: Multiplanar, multiecho pulse sequences of the brain and surrounding structures were obtained without intravenous contrast. COMPARISON:  CT 05/02/2015.  MRI 03/25/2012. FINDINGS: Diffusion imaging does not show any acute or subacute infarction. There are old small vessel ischemic changes affecting the pons. Few old small vessel cerebellar infarctions. Within the cerebral hemispheres, there are old cortical infarctions in the left frontal, occipital and parietal region. There chronic small-vessel ischemic changes affecting the thalami, basal ganglia and hemispheric white matter. The ventricles are prominent, consistent with central atrophy. No suspicion of obstructive hydrocephalus. No neoplastic mass lesion, hemorrhage or extra-axial collection. No pituitary mass. No inflammatory sinus disease. No skull or skullbase lesion. Major vessels at the base of the brain show flow. IMPRESSION: No acute or reversible process. Atrophy and chronic small vessel ischemic changes throughout the brain. Old cortical infarctions in the left occipital, parietal and posterior frontal regions. Electronically Signed   By: Nelson Chimes M.D.   On: 05/03/2015 11:42   Dg Chest Port 1 View  04/13/2015  CLINICAL DATA:  Patient is unresponsive. History of stroke, MI, hypertension, diabetes, coronary artery disease, dementia,  dialysis, CHF, GERD, ESRD, PVC. EXAM: PORTABLE CHEST 1 VIEW COMPARISON:  02/28/2015 FINDINGS: Cardiac enlargement without vascular congestion. No focal airspace disease or consolidation in the lungs. Focal scarring in the mid lung on the left. No blunting of costophrenic angles. No pneumothorax. Calcification of aorta. No change since prior study. IMPRESSION: Cardiac enlargement.  No evidence of active pulmonary disease. Electronically Signed   By: Lucienne Capers M.D.   On: 04/13/2015 01:51    EKG: Patient in HD  Weights: Filed Weights   05/02/15 1941 05/03/15 0210  Weight: 155 lb (70.308 kg) 142 lb (64.411 kg)     Physical Exam: Blood pressure 119/76, pulse 70, temperature 97.3 F (36.3 C), temperature source Axillary, resp. rate 19, height _0  (1.6 m), weight 142 lb (64.411 kg), SpO2 100 %. Body mass index is 25.16 kg/(m^2). General: Well developed, well nourished, in no acute distress. Head: Normocephalic, atraumatic, sclera non-icteric, no xanthomas, nares are without discharge.  Neck: Negative for carotid bruits. JVD not elevated. Lungs: Clear bilaterally to auscultation without wheezes, rales, or rhonchi. Breathing is unlabored. Heart: RRR with S1 S2. IV/VI systolic murmur RUSB. No rubs, or gallops appreciated. Abdomen: Soft, non-tender, non-distended with normoactive bowel sounds. No hepatomegaly. No rebound/guarding. No obvious abdominal masses. Msk:  Strength and tone appear normal for age. Extremities: No clubbing or cyanosis. No edema.  Distal pedal pulses are 2+ and equal bilaterally. Neuro: Alert. No facial asymmetry. No focal deficit. Psych:  Somnolent.    Assessment and Plan:   1. Elevated troponin/likely underlying CAD: Of uncertain significance at this time given patient's dementia and comorbidites. Patient has underlying ESRD on HD and did miss some dialysis. She did have a troponin peak at 7.70 in January which was medically managed at that time given her multiple  medical comorbidities including her dementia, ESRD, and lack of being able to get in contact with family during prior hospital admissions to discuss her care. Continue conservative care at this time with heparin gtt. Continue aspirin, Plavix, Lipitor. Start low dose Coreg 3.125 mg bid.   2. Severe aortic stenosis: She has been medically managed as above. This will lead to an overall poor prognosis. She has been made a DNR/DNI  at her last admission. Per Dr. Donivan Scull note during prior admission, "without aortic valve surgery, the patient's symptoms will continue to progress with worsening cardiac failure, shortness of breath, troponin elevations, near-syncopal syncope, and difficulty with hemodialysis." Recommend Palliative care consult.   3. Ischemic cardiomyopathy: Volume managed by HD. Coreg added as above.    4. ESRD: On HD. Missed some dialysis sessions last week.   5. AMS: Possibly 2/2 #4 with metabolic encephalopathy. Per IM.    Melvern Banker, PA-C Pager: [phone] 05/03/2015, 3:04 PM

## 2015-05-03 NOTE — Clinical Social Work Note (Addendum)
Clinical Social Work Assessment  Patient Details  Name: [name] MRN: [id number] Date of Birth: Oct 10, 1945  Date of referral:  05/03/15               Reason for consult:  Discharge Planning                Permission sought to share information with:  Family Supports Permission granted to share information::     Name::        Agency::     Relationship::   Randye Lobo - guardian [phone])  Contact Information:     Housing/Transportation Living arrangements for the past 2 months:  Single Family Home Source of Information:  Adult Children Randye Lobo - guardian [phone]) Patient Interpreter Needed:    Criminal Activity/Legal Involvement Pertinent to Current Situation/Hospitalization:  No - Comment as needed Significant Relationships:  Adult Children Randye Lobo - guardian [phone]) Lives with:  Adult Children Randye Lobo - guardian [phone]) Do you feel safe going back to the place where you live?  Yes Need for family participation in patient care:  Yes (Comment) Randye Lobo - guardian [phone])  Care giving concerns:  Patient is from [facility]. Patient is unresponsive and her daughter, Randye Lobo - guardian [phone] is looking to consider Palliative Care.    Social Worker assessment / plan:  CSW is familiar with patient from last two hospital admissions. CSW contacted patient's daughter Randye Lobo - guardian [phone] to discuss discharge plans. CSW informed Vena Austria that there has been a Palliative Care Consult placed for patient. CSW informed Vena Austria that patient cannot get outpatient dialysis on a stretcher and that she will have to be able to sit up in a chair. Vena Austria reported that she understands and that she's willing to explore Palliative Care options. Reports that she's willing to speak to Clydie Braun with [place]. CSW contacted Clydie Braun- [place] and left a voicemail. Patient's daughter is unsure of discharge plans  at this time. Reports that "it's a difficult situation but I'm thinking this may be best."   Due to discharge plans being unclear. CSW has completed FL2/ PASRR and faxed to [facility]. Left voicemail for [name]-admissions at [facility]. CSW will continue to follow assist.     Employment status:  Retired Health and safety inspector:  Medicare PT Recommendations:  Not assessed at this time Information / Referral to community resources:     Patient/Family's Response to care:  Patient's daughter Vena Austria is willing to explore Palliative Care options.   Patient/Family's Understanding of and Emotional Response to Diagnosis, Current Treatment, and Prognosis:  Patient's daughter understands CSW's role and is appreciative of CSW's continued support.  Emotional Assessment Appearance:  Appears stated age Attitude/Demeanor/Rapport:  Unable to Assess Affect (typically observed):  Unable to Assess Orientation:  Oriented to Self Alcohol / Substance use:  Not Applicable Psych involvement (Current and /or in the community):  No (Comment)  Discharge Needs  Concerns to be addressed:  Discharge Planning Concerns Readmission within the last 30 days:  No Current discharge risk:  Chronically ill Barriers to Discharge:  Continued Medical Work up   [place], [name] 05/03/2015, 4:15 PM

## 2015-05-03 NOTE — Progress Notes (Signed)
ANTICOAGULATION CONSULT NOTE - Follow up Consult  Pharmacy Consult for heparin drip Indication: ACS/STEMI  Allergies  Allergen Reactions  . Contrast Media [Iodinated Diagnostic Agents] Anaphylaxis    Patient Measurements: Height:  (160 cm) Weight: 142 lb (64.411 kg) IBW/kg (Calculated) : 52.4 Heparin Dosing Weight: 70.3kg  Vital Signs: Temp: 97.3 F (36.3 C) (02/13 0605) Temp Source: Axillary (02/13 0605) BP: 132/75 mmHg (02/13 1230) Pulse Rate: 85 (02/13 1230)  Labs:  Recent Labs  04/30/15 1302 05/02/15 1040 05/02/15 1933 05/03/15 0018 05/03/15 0747  HGB 9.6*  --  12.5  --   --   HCT 29.5*  --  38.6  --   --   PLT 198  --  173  --   --   APTT  --   --   --  32  --   LABPROT  --   --   --  18.2*  --   INR  --   --   --  1.50  --   CREATININE 5.64* 5.21* 5.74*  --   --   TROPONINI  --   --  1.50*  --  1.20*    Estimated Creatinine Clearance: 8.4 mL/min (by C-G formula based on Cr of 5.74).   Assessment: 70 yo female here with elevated troponins.   Hgb 12.5  plt 173 INR 1.5  aPTT 32  Goal of Therapy:  Heparin level 0.3-0.7 units/ml Monitor platelets by anticoagulation protocol: Yes   Plan:  4000 unit bolus and initial rate of 850 units/hr. First heparin level 8 hours after start of infusion.  2/13: Heparin level was due at 1100 but not drawn. Apparently heparin drip was turned off for MRI at ~ 1030 AM per RN report and then pt went straight to dialysis without turning the heparin drip back on. Heparin had been running at 8.5 ml/hr with no bleeding or line issues noted by AM RN. Will d/c heparin level at this time. Per Dr. Juliene Pina, waiting on cards to see pt, continue heparin drip for now. Asked RN to see if someone can go to the HD unit and restart heparin drip as pt still has about another 3h left of dialysis (RN stated she would call back when restarted).   Change in pt's wt, will adjust heparin drip to 800 units/hr (=8 ml/hr). CBC today when labs drawn.    Marty Heck 05/03/2015,12:43 PM

## 2015-05-03 NOTE — Progress Notes (Signed)
[facility]  ROUNDING NOTE   Subjective:   Progressive dementia. Admitted for altered mental status. Discharged on 2/10.  Placed on dialysis. Tolerating treatment well. UF of 0.5litre   Objective:  Vital signs in last 24 hours:  Temp:  [97.3 F (36.3 C)-98.1 F (36.7 C)] 97.3 F (36.3 C) (02/13 0605) Pulse Rate:  [83-94] 85 (02/13 1230) Resp:  [16-31] 18 (02/13 1230) BP: (110-146)/(64-86) 132/75 mmHg (02/13 1230) SpO2:  [76 %-100 %] 100 % (02/13 1216) Weight:  [64.411 kg (142 lb)-70.308 kg (155 lb)] 64.411 kg (142 lb) (02/13 0210)  Weight change:  Filed Weights   05/02/15 1941 05/03/15 0210  Weight: 70.308 kg (155 lb) 64.411 kg (142 lb)    Intake/Output: I/O last 3 completed shifts: In: 17 [I.V.:17] Out: 3 [Urine:3]   Intake/Output this shift:     Physical Exam: General: NAD, laying in bed.   Head: Normocephalic, atraumatic. Moist oral mucosal membranes  Eyes: Anicteric, PERRL  Neck: Supple, trachea midline  Lungs:  Clear to auscultation  Heart: S1S2 no rubs  Abdomen:  Soft, nontender, BS present   Extremities: No peripheral edema.  Neurologic: Awake, alert, followed simple commands  Skin: No lesions  Access: LUE AVG    Basic Metabolic Panel:  Recent Labs Lab 04/28/15 1209 04/29/15 0529 04/29/15 2001 04/30/15 1302 05/02/15 1040 05/02/15 1933  NA 135 133*  --  137 136 138  K 4.8 5.0  --  4.5 6.5* 6.3*  CL 87* 89*  --  94* 97* 92*  CO2 32 31  --  32 23 26  GLUCOSE 158* 217*  --  145* 125* 209*  BUN 53* 62*  --  56* 54* 59*  CREATININE 5.51* 6.36*  --  5.64* 5.21* 5.74*  CALCIUM 9.5 9.3  --  9.5 9.2 9.8  PHOS  --   --  3.0 2.7  --   --     Liver Function Tests:  Recent Labs Lab 04/28/15 1209 04/30/15 1302 05/02/15 1933  AST 28  --  177*  ALT 23  --  150*  ALKPHOS 110  --  227*  BILITOT 0.8  --  1.2  PROT 7.4  --  7.6  ALBUMIN 3.6 2.8* 3.6    Recent Labs Lab 05/02/15 1933  LIPASE 20    Recent Labs Lab 05/02/15 1952   AMMONIA 24    CBC:  Recent Labs Lab 04/28/15 1209 04/29/15 0529 04/30/15 1302 05/02/15 1933  WBC 6.3 4.5 4.0 4.2  NEUTROABS 5.3  --   --  3.0  HGB 12.4 11.9* 9.6* 12.5  HCT 38.8 37.3 29.5* 38.6  MCV 96.7 96.6 95.9 97.6  PLT 259 237 198 173    Cardiac Enzymes:  Recent Labs Lab 04/29/15 2001 04/30/15 0032 04/30/15 0500 05/02/15 1933 05/03/15 0747  TROPONINI 0.37* 0.37* 0.49* 1.50* 1.20*    BNP: Invalid input(s): POCBNP  CBG:  Recent Labs Lab 04/30/15 0734 04/30/15 1119 04/30/15 1723 04/30/15 1800 05/03/15 0929  GLUCAP 183* 172* 53* 200* 136*    Microbiology: Results for orders placed or performed during the hospital encounter of 04/13/15  Culture, blood (routine x 2)     Status: None   Collection Time: 04/13/15  2:26 AM  Result Value Ref Range Status   Specimen Description BLOOD BLOOD RIGHT FOREARM  Final   Special Requests BOTTLES DRAWN AEROBIC AND ANAEROBIC  Final   Culture NO GROWTH 5 DAYS  Final   Report Status 04/18/2015 FINAL  Final  Culture, blood (routine x 2)     Status: None   Collection Time: 04/13/15  2:50 AM  Result Value Ref Range Status   Specimen Description BLOOD RIGHT HAND  Final   Special Requests BOTTLES DRAWN AEROBIC AND ANAEROBIC  Final   Culture NO GROWTH 5 DAYS  Final   Report Status 04/18/2015 FINAL  Final  MRSA PCR Screening     Status: None   Collection Time: 04/13/15  6:18 AM  Result Value Ref Range Status   MRSA by PCR NEGATIVE NEGATIVE Final    Comment:        The GeneXpert MRSA Assay (FDA approved for NASAL specimens only), is one component of a comprehensive MRSA colonization surveillance program. It is not intended to diagnose MRSA infection nor to guide or monitor treatment for MRSA infections.     Coagulation Studies:  Recent Labs  05/03/15 0018  LABPROT 18.2*  INR 1.50    Urinalysis:  Recent Labs  05/02/15 2257  COLORURINE AMBER*  LABSPEC 1.020  PHURINE 5.0  GLUCOSEU 50*   HGBUR 1+*  BILIRUBINUR NEGATIVE  KETONESUR TRACE*  PROTEINUR >500*  NITRITE NEGATIVE  LEUKOCYTESUR 2+*      Imaging: Dg Chest 1 View  05/02/2015  CLINICAL DATA:  Unresponsive patient. Altered mental status. History of stroke, MI, hypertension, diabetes, coronary artery disease, dementia, GERD, CHF, renal disease, previous cardiac catheterization. EXAM: CHEST 1 VIEW COMPARISON:  04/28/2015 FINDINGS: Shallow inspiration. Cardiac enlargement with mild vascular congestion similar to previous study. Linear fibrosis or atelectasis in the left mid lungs. No developing consolidation. No blunting of costophrenic angles. No pneumothorax. Calcified and tortuous aorta. IMPRESSION: Cardiac enlargement with mild vascular congestion. No change since previous study. Electronically Signed   By: Burman Nieves M.D.   On: 05/02/2015 21:09   Ct Head Wo Contrast  05/02/2015  CLINICAL DATA:  Unresponsive patient. Altered mental status. Patient was here with similar complaints on 05/18/2015. EXAM: CT HEAD WITHOUT CONTRAST TECHNIQUE: Contiguous axial images were obtained from the base of the skull through the vertex without intravenous contrast. COMPARISON:  04/28/2015 FINDINGS: Examination is technically limited due to motion artifact. Encephalomalacia in the left occipital lobe consistent with old infarct. Mild diffuse cerebral atrophy. Ventricular dilatation consistent with central atrophy. Low-attenuation changes throughout the deep white matter consistent with small vessel ischemia. Old lacunar infarcts in the basal ganglia. No mass effect or midline shift. No abnormal extra-axial fluid collections. Gray-white matter junctions are distinct. Basal cisterns are not effaced. No evidence of acute intracranial hemorrhage. No depressed skull fractures. Visualized paranasal sinuses and mastoid air cells are not opacified. Prominent vascular calcifications. IMPRESSION: Old infarct in the left occipital lobe. Chronic atrophy  and small vessel ischemic changes. No acute intracranial abnormalities. Unchanged appearance since previous study. Electronically Signed   By: Burman Nieves M.D.   On: 05/02/2015 21:12   Mr Brain Wo Contrast  05/03/2015  CLINICAL DATA:  Acute presentation with altered mental status yesterday. Somnolent. Unresponsive. EXAM: MRI HEAD WITHOUT CONTRAST TECHNIQUE: Multiplanar, multiecho pulse sequences of the brain and surrounding structures were obtained without intravenous contrast. COMPARISON:  CT 05/02/2015.  MRI 03/25/2012. FINDINGS: Diffusion imaging does not show any acute or subacute infarction. There are old small vessel ischemic changes affecting the pons. Few old small vessel cerebellar infarctions. Within the cerebral hemispheres, there are old cortical infarctions in the left frontal, occipital and parietal region. There chronic small-vessel ischemic changes affecting the thalami, basal ganglia and hemispheric white matter. The ventricles  are prominent, consistent with central atrophy. No suspicion of obstructive hydrocephalus. No neoplastic mass lesion, hemorrhage or extra-axial collection. No pituitary mass. No inflammatory sinus disease. No skull or skullbase lesion. Major vessels at the base of the brain show flow. IMPRESSION: No acute or reversible process. Atrophy and chronic small vessel ischemic changes throughout the brain. Old cortical infarctions in the left occipital, parietal and posterior frontal regions. Electronically Signed   By: Paulina Fusi M.D.   On: 05/03/2015 11:42     Medications:   . heparin 850 Units/hr (05/03/15 0329)   . acidophilus  1 capsule Oral Daily  . albuterol  2.5 mg Nebulization Once  . aspirin EC  81 mg Oral Daily  . atorvastatin  80 mg Oral Daily  . calcium acetate  1,334 mg Oral TID WC  . cholecalciferol  2,000 Units Oral Daily  . clopidogrel  75 mg Oral Daily  . docusate sodium  100 mg Oral QHS  . famotidine  20 mg Oral QHS  . feeding supplement  (NEPRO CARB STEADY)  237 mL Oral BID BM  . insulin aspart  0-5 Units Subcutaneous QHS  . insulin aspart  0-9 Units Subcutaneous TID WC  . iron polysaccharides  150 mg Oral Daily  . levETIRAcetam  500 mg Oral Q M,W,F-2000  . levETIRAcetam  750 mg Oral Q24H   acetaminophen **OR** acetaminophen, bisacodyl, lidocaine-prilocaine, morphine injection, ondansetron **OR** ondansetron (ZOFRAN) IV  Assessment/ Plan:  70 y.o. 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   [facility] M[facility] [address]   1. End-stage renal disease on hemodialysis:  Seen and examined on hemodialysis. Tolerating treatment well. UF of 0.5 litres.   2.  Anemia chronic kidney disease.  Hemoglobin 12.5 - Hold off on erythropoietin stimulating agents. - iron supplements.  3. Hypertension: blood pressure at goal.   4.  Secondary hyperparathyroidism: phos 2.7. PTh 250. Not currently on any vitamin D agents.  - Continue calcium acetate for binding   LOS:  Erric Machnik 2/13/20171:42 PM

## 2015-05-03 NOTE — Progress Notes (Signed)
ANTICOAGULATION CONSULT NOTE - Initial Consult  Pharmacy Consult for heparin drip Indication: ACS/STEMI  Allergies  Allergen Reactions  . Contrast Media [Iodinated Diagnostic Agents] Anaphylaxis    Patient Measurements: Height:  (165.1 cm) Weight: 155 lb (70.308 kg) IBW/kg (Calculated) : 57 Heparin Dosing Weight: 70.3kg  Vital Signs: Temp: 97.6 F (36.4 C) (02/12 1941) Temp Source: Axillary (02/12 1941) BP: 116/86 mmHg (02/13 0038) Pulse Rate: 92 (02/13 0038)  Labs:  Recent Labs  04/30/15 0500 04/30/15 1302 05/02/15 1040 05/02/15 1933 05/03/15 0018  HGB  --  9.6*  --  12.5  --   HCT  --  29.5*  --  38.6  --   PLT  --  198  --  173  --   APTT  --   --   --   --  32  LABPROT  --   --   --   --  18.2*  INR  --   --   --   --  1.50  CREATININE  --  5.64* 5.21* 5.74*  --   TROPONINI 0.49*  --   --  1.50*  --     Estimated Creatinine Clearance: 9.1 mL/min (by C-G formula based on Cr of 5.74).   Medical History: Past Medical History  Diagnosis Date  . Stroke (HCC)   . MI (myocardial infarction) (HCC)   . Hearing impaired   . Hyperlipidemia   . Hypertension   . Diabetes mellitus without complication (HCC)   . Depression   . Coronary artery disease   . Dementia   . Secondary hyperparathyroidism (HCC)   . Chronic systolic CHF (congestive heart failure) (HCC)   . GERD (gastroesophageal reflux disease)   . Personal history of transient ischemic attack (TIA) and cerebral infarction without residual deficit   . Anemia   . Moderate aortic stenosis     a. echo 04/2014: 40-50%, moderate AS b. echo 01/2015: EF 35-40%, Severe AS, mild MR, moderate TR  . ESRD (end stage renal disease) (HCC)     a. Dialysis MWF    Medications:    Assessment: Hgb 12.5  plt 173 INR 1.5  aPTT 32  Goal of Therapy:  Heparin level 0.3-0.7 units/ml Monitor platelets by anticoagulation protocol: Yes   Plan:  4000 unit bolus and initial rate of 850 units/hr. First heparin level 8  hours after start of infusion.  Caileen Veracruz S 05/03/2015,12:53 AM

## 2015-05-03 NOTE — H&P (Signed)
[name] is an 70 y.o. female.   Chief Complaint: Altered mental status HPI: The patient with past medical history of end-stage renal disease on dialysis presents to the emergency department from her nursing home due to altered mental status. The patient's daughter states that she became somnolent and unresponsive earlier this week. She was evaluated the emergency department and admitted to the hospital. She missed dialysis 5 days ago but received a treatment on her next scheduled day. She appeared to improve. She was able to take her medicines and eat the day of discharge from the hospital. However, the following day her level of alertness declined. In the emergency department the patient was found to have an elevated troponin. Though she didn't.indicate in any way that her chest was hurting she did seem to be short of breath at times. The emergency department staff started therapeutic heparin and called the hospitalist service for admission.  Past Medical History  Diagnosis Date  . Stroke ([place])   . MI (myocardial infarction) ([place])   . Hearing impaired   . Hyperlipidemia   . Hypertension   . Diabetes mellitus without complication ([place])   . Depression   . Coronary artery disease   . Dementia   . Secondary hyperparathyroidism ([place])   . Chronic systolic CHF (congestive heart failure) ([place])   . GERD (gastroesophageal reflux disease)   . Personal history of transient ischemic attack (TIA) and cerebral infarction without residual deficit   . Anemia   . Moderate aortic stenosis     a. echo 04/2014: 40-50%, moderate AS b. echo 01/2015: EF 35-40%, Severe AS, mild MR, moderate TR  . ESRD (end stage renal disease) ([place])     a. Dialysis MWF    Past Surgical History  Procedure Laterality Date  . Cataract extraction, bilateral    . Wrist surgery    . Peripheral vascular catheterization Left 07/28/2014    Procedure: A/V Shuntogram/Fistulagram;  Surgeon: Katha Cabal, MD;  Location: [facility];  Service: Cardiovascular;  Laterality: Left;  . Peripheral vascular catheterization Left 07/28/2014    Procedure: A/V Shunt Intervention;  Surgeon: Katha Cabal, MD;  Location: [facility];  Service: Cardiovascular;  Laterality: Left;  . Peripheral vascular catheterization Left 02/04/2015    Procedure: A/V Shuntogram/Fistulagram;  Surgeon: Algernon Huxley, MD;  Location: [facility];  Service: Cardiovascular;  Laterality: Left;  . Peripheral vascular catheterization Left 02/04/2015    Procedure: A/V Shunt Intervention;  Surgeon: Algernon Huxley, MD;  Location: [facility];  Service: Cardiovascular;  Laterality: Left;    Family History  Problem Relation Age of Onset  . CAD Father    Social History:  reports that she has never smoked. She does not have any smokeless tobacco history on file. She reports that she does not drink alcohol or use illicit drugs.  Allergies:  Allergies  Allergen Reactions  . Contrast Media [Iodinated Diagnostic Agents] Anaphylaxis    Medications Prior to Admission  Medication Sig Dispense Refill  . aspirin EC 81 MG tablet Take 81 mg by mouth daily.    [name] atorvastatin (LIPITOR) 80 MG tablet Take 1 tablet (80 mg total) by mouth daily. 30 tablet 1  . bisacodyl (DULCOLAX) 10 MG suppository Place 1 suppository (10 mg total) rectally daily as needed for moderate constipation. 12 suppository 0  . calcium acetate (PHOSLO) 667 MG capsule Take 1,334 mg by mouth 3 (three) times daily with meals.     [name]  cholecalciferol (VITAMIN D) 1000 units tablet Take 2,000 Units by mouth daily.    . clopidogrel (PLAVIX) 75 MG tablet Take 1 tablet (75 mg total) by mouth daily. 30 tablet 3  . Cranberry 450 MG TABS Take 450 mg by mouth 2 (two) times daily.    [name] docusate sodium (COLACE) 100 MG capsule Take 100 mg by mouth at bedtime.     . insulin glargine (LANTUS) 100 UNIT/ML injection Inject 5 Units into the skin daily.    . insulin lispro (HUMALOG)  100 UNIT/ML injection Inject 0-9 Units into the skin 4 (four) times daily -  before meals and at bedtime. Pt uses as needed per sliding scale:    0-99:  0 units  100-149:  2 units  150-199:  3 units  200-249:  4 units  250-299:  5 units  300-349:  6 units 350-399:  7 units 400-449:  8 units 450-499:  9 units  500 or greater:  Call MD    . iron polysaccharides (NIFEREX) 150 MG capsule Take 150 mg by mouth daily.    [name] lactobacillus acidophilus (BACID) TABS tablet Take 1 tablet by mouth daily.     [name] levETIRAcetam (KEPPRA) 500 MG tablet Take 1 tablet (500 mg total) by mouth every Monday, Wednesday, and Friday at 8 PM. 30 tablet 1  . levETIRAcetam (KEPPRA) 750 MG tablet Take 1 tablet (750 mg total) by mouth daily. 30 tablet 1  . lidocaine-prilocaine (EMLA) cream Apply 1 application topically as needed (prior to dialysis on Monday, Wednesday, and Friday).    . Nutritional Supplements (FEEDING SUPPLEMENT, NEPRO CARB STEADY,) LIQD Take 237 mLs by mouth 2 (two) times daily between meals. 60 Can 0  . ranitidine (ZANTAC) 150 MG tablet Take 1 tablet (150 mg total) by mouth at bedtime. 90 tablet 3    Results for orders placed or performed during the hospital encounter of 05/02/15 (from the past 48 hour(s))  CBC with Differential     Status: Abnormal   Collection Time: 05/02/15  7:33 PM  Result Value Ref Range   WBC 4.2 3.6 - 11.0 K/uL   RBC 3.95 3.80 - 5.20 MIL/uL   Hemoglobin 12.5 12.0 - 16.0 g/dL   HCT 38.6 35.0 - 47.0 %   MCV 97.6 80.0 - 100.0 fL   MCH 31.6 26.0 - 34.0 pg   MCHC 32.4 32.0 - 36.0 g/dL   RDW 22.7 (H) 11.5 - 14.5 %   Platelets 173 150 - 440 K/uL   Neutrophils Relative % 72 %   Lymphocytes Relative 16 %   Monocytes Relative 11 %   Eosinophils Relative 0 %   Basophils Relative 1 %   Band Neutrophils 0 %   Metamyelocytes Relative 0 %   Myelocytes 0 %   Promyelocytes Absolute 0 %   Blasts 0 %   nRBC 9 (H) 0 /100 WBC   Other 0 %   Neutro Abs 3.0 1.4 - 6.5 K/uL   Lymphs  Abs 0.7 (L) 1.0 - 3.6 K/uL   Monocytes Absolute 0.5 0.2 - 0.9 K/uL   Eosinophils Absolute 0.0 0 - 0.7 K/uL   Basophils Absolute 0.0 0 - 0.1 K/uL   RBC Morphology POLYCHROMASIA PRESENT     Comment: MIXED RBC POPULATION   Smear Review      PATH REVIEW HAS BEEN ORDERED ON THIS ACCESSION NUMBER  Comprehensive metabolic panel     Status: Abnormal   Collection Time: 05/02/15  7:33 PM  Result  Value Ref Range   Sodium 138 135 - 145 mmol/L   Potassium 6.3 (H) 3.5 - 5.1 mmol/L   Chloride 92 (L) 101 - 111 mmol/L   CO2 26 22 - 32 mmol/L   Glucose, Bld 209 (H) 65 - 99 mg/dL   BUN 59 (H) 6 - 20 mg/dL   Creatinine, Ser 5.74 (H) 0.44 - 1.00 mg/dL   Calcium 9.8 8.9 - 10.3 mg/dL   Total Protein 7.6 6.5 - 8.1 g/dL   Albumin 3.6 3.5 - 5.0 g/dL   AST 177 (H) 15 - 41 U/L   ALT 150 (H) 14 - 54 U/L   Alkaline Phosphatase 227 (H) 38 - 126 U/L   Total Bilirubin 1.2 0.3 - 1.2 mg/dL   GFR calc non Af Amer 7 (L) >60 mL/min   GFR calc Af Amer 8 (L) >60 mL/min    Comment: (NOTE) The eGFR has been calculated using the CKD EPI equation. This calculation has not been validated in all clinical situations. eGFR's persistently <60 mL/min signify possible Chronic Kidney Disease.    Anion gap 20 (H) 5 - 15  Lipase, blood     Status: None   Collection Time: 05/02/15  7:33 PM  Result Value Ref Range   Lipase 20 11 - 51 U/L  Troponin I     Status: Abnormal   Collection Time: 05/02/15  7:33 PM  Result Value Ref Range   Troponin I 1.50 (H) <0.031 ng/mL    Comment: READ BACK AND VERIFIED WITH NOEL WEBSTER @ 2103 ON 05/02/15 BY [facility]        POSSIBLE MYOCARDIAL ISCHEMIA. SERIAL TESTING RECOMMENDED.   TSH     Status: None   Collection Time: 05/02/15  7:33 PM  Result Value Ref Range   TSH 2.570 0.350 - 4.500 uIU/mL  Ammonia     Status: None   Collection Time: 05/02/15  7:52 PM  Result Value Ref Range   Ammonia 24 9 - 35 umol/L  Urinalysis complete, with microscopic ([facility] only)     Status: Abnormal   Collection  Time: 05/02/15 10:57 PM  Result Value Ref Range   Color, Urine AMBER (A) YELLOW   APPearance CLOUDY (A) CLEAR   Glucose, UA 50 (A) NEGATIVE mg/dL   Bilirubin Urine NEGATIVE NEGATIVE   Ketones, ur TRACE (A) NEGATIVE mg/dL   Specific Gravity, Urine 1.020 1.005 - 1.030   Hgb urine dipstick 1+ (A) NEGATIVE   pH 5.0 5.0 - 8.0   Protein, ur >500 (A) NEGATIVE mg/dL   Nitrite NEGATIVE NEGATIVE   Leukocytes, UA 2+ (A) NEGATIVE   RBC / HPF 6-30 0 - 5 RBC/hpf   WBC, UA TOO NUMEROUS TO COUNT 0 - 5 WBC/hpf   Bacteria, UA FEW (A) NONE SEEN   Squamous Epithelial / LPF 6-30 (A) NONE SEEN   Trans Epithel, UA 1    Mucous PRESENT   APTT     Status: None   Collection Time: 05/03/15 12:18 AM  Result Value Ref Range   aPTT 32 24 - 36 seconds  Protime-INR     Status: Abnormal   Collection Time: 05/03/15 12:18 AM  Result Value Ref Range   Prothrombin Time 18.2 (H) 11.4 - 15.0 seconds   INR 1.50    Dg Chest 1 View  05/02/2015  CLINICAL DATA:  Unresponsive patient. Altered mental status. History of stroke, MI, hypertension, diabetes, coronary artery disease, dementia, GERD, CHF, renal disease, previous cardiac catheterization. EXAM: CHEST 1 VIEW  COMPARISON:  04/28/2015 FINDINGS: Shallow inspiration. Cardiac enlargement with mild vascular congestion similar to previous study. Linear fibrosis or atelectasis in the left mid lungs. No developing consolidation. No blunting of costophrenic angles. No pneumothorax. Calcified and tortuous aorta. IMPRESSION: Cardiac enlargement with mild vascular congestion. No change since previous study. Electronically Signed   By: Lucienne Capers M.D.   On: 05/02/2015 21:09   Ct Head Wo Contrast  05/02/2015  CLINICAL DATA:  Unresponsive patient. Altered mental status. Patient was here with similar complaints on 05/18/2015. EXAM: CT HEAD WITHOUT CONTRAST TECHNIQUE: Contiguous axial images were obtained from the base of the skull through the vertex without intravenous contrast.  COMPARISON:  04/28/2015 FINDINGS: Examination is technically limited due to motion artifact. Encephalomalacia in the left occipital lobe consistent with old infarct. Mild diffuse cerebral atrophy. Ventricular dilatation consistent with central atrophy. Low-attenuation changes throughout the deep white matter consistent with small vessel ischemia. Old lacunar infarcts in the basal ganglia. No mass effect or midline shift. No abnormal extra-axial fluid collections. Gray-white matter junctions are distinct. Basal cisterns are not effaced. No evidence of acute intracranial hemorrhage. No depressed skull fractures. Visualized paranasal sinuses and mastoid air cells are not opacified. Prominent vascular calcifications. IMPRESSION: Old infarct in the left occipital lobe. Chronic atrophy and small vessel ischemic changes. No acute intracranial abnormalities. Unchanged appearance since previous study. Electronically Signed   By: Lucienne Capers M.D.   On: 05/02/2015 21:12    Review of Systems  Unable to perform ROS: mental status change    Blood pressure 122/69, pulse 87, temperature 98.1 F (36.7 C), temperature source Oral, resp. rate 16, height 5' 3"  (1.6 m), weight 64.411 kg (142 lb), SpO2 100 %. Physical Exam  Vitals reviewed. Constitutional: She is oriented to person, place, and time. She appears well-developed and well-nourished. No distress.  HENT:  Head: Normocephalic and atraumatic.  Mouth/Throat: Oropharynx is clear and moist.  Eyes: Conjunctivae and EOM are normal. Pupils are equal, round, and reactive to light.  Neck: Normal range of motion. Neck supple. No JVD present. No tracheal deviation present. No thyromegaly present.  Cardiovascular: Normal rate and regular rhythm.  Exam reveals friction rub (??). Exam reveals no gallop.   Murmur heard.  Systolic murmur is present with a grade of 3/6  Respiratory: Effort normal and breath sounds normal.  GI: Soft. Bowel sounds are normal. She  exhibits no distension. There is no tenderness.  Genitourinary:  Deferred  Musculoskeletal: Normal range of motion. She exhibits edema (Trace).  Lymphadenopathy:    She has no cervical adenopathy.  Neurological: She is oriented to person, place, and time. No cranial nerve deficit. She exhibits normal muscle tone.  Somnolent; usually does not track examiner or open her eyes that she will do both spontaneously at times. She also spontaneously moves upper and lower extremities bilaterally   Skin: Skin is warm and dry. No rash noted. No erythema.     Assessment/Plan This is a 70 year old African American female admitted for elevated troponin and somnolence. 1. Elevated troponin: Is unclear if this represents a cardiac event or if this is secondary to demand ischemia (perhaps during dialysis) and retained enzymes secondary to poor renal clearance. We will not try troponin at this time. The patient has been started on therapeutic heparin and we will monitor telemetry. Cardiology has been consulted. Continue aspirin and Plavix 2. Altered mental status: Somnolence. Etiology is unclear although differential diagnosis includes uremia and metabolic encephalopathy. The patient is due for dialysis tomorrow.  She is currently stable and if she remains so perhaps dialysis treatment will improve her mental status. 3. End-stage renal disease: On dialysis MWF; nephrology consulted to continue treatment 4. Hyperkalemia: Secondary to end-stage renal disease. Potassium is shift of the emergency department. The patient has not had any arrhythmias while being monitored. 5. Diabetes mellitus type 2: I will continue basal insulin. The patient is currently nothing by mouth except for meds and sips as I doubt she will be alert enough to swallow safely. Every 4 hour fingersticks and sliding scale insulin as necessary. 6. Hypertension: Secondary to renal disease; controlled. 7. Seizure disorder: Continue Keppra 8.  Hyperlipidemia: Continue statin therapy  9. DVT prophylaxis: As above 10. GI prophylaxis: Zantac per home regimen The patient is a DO NOT RESUSCITATE. Time spent on admission orders and patient care approximately 45 minutes  Harrie Foreman, MD 05/03/2015, 2:14 AM

## 2015-05-03 NOTE — Progress Notes (Signed)
[facility] - [place] at [facility]   PATIENT NAME: [name]    MR#:  [id number]  DATE OF BIRTH:  Sep 10, 1945  SUBJECTIVE:   Patient is lethargic  REVIEW OF SYSTEMS:    Review of Systems  Unable to perform ROS   Tolerating Diet:npo      DRUG ALLERGIES:   Allergies  Allergen Reactions  . Contrast Media [Iodinated Diagnostic Agents] Anaphylaxis    VITALS:  Blood pressure 140/64, pulse 83, temperature 97.3 F (36.3 C), temperature source Axillary, resp. rate 18, height  (1.6 m), weight 64.411 kg (142 lb), SpO2 100 %.  PHYSICAL EXAMINATION:   Physical Exam  Constitutional: She is oriented to person, place, and time and well-developed, well-nourished, and in no distress. No distress.  HENT:  Head: Normocephalic.  Eyes: No scleral icterus.  Neck: No JVD present. No tracheal deviation present.  Cardiovascular: Normal rate, regular rhythm and normal heart sounds.  Exam reveals no gallop and no friction rub.   No murmur heard. Pulmonary/Chest: Effort normal and breath sounds normal. No respiratory distress. She has no wheezes. She has no rales. She exhibits no tenderness.  Abdominal: Soft. Bowel sounds are normal. She exhibits no distension and no mass. There is no tenderness. There is no rebound and no guarding.  Musculoskeletal: Normal range of motion. She exhibits no edema.  Neurological: She is oriented to person, place, and time.  Facial droop, lethargic  Skin: Skin is warm. No rash noted. No erythema.      LABORATORY PANEL:   CBC  Recent Labs Lab 05/02/15 1933  WBC 4.2  HGB 12.5  HCT 38.6  PLT 173   ------------------------------------------------------------------------------------------------------------------  Chemistries   Recent Labs Lab 05/02/15 1933  NA 138  K 6.3*  CL 92*  CO2 26  GLUCOSE 209*  BUN 59*  CREATININE 5.74*  CALCIUM 9.8  AST 177*  ALT 150*  ALKPHOS 227*  BILITOT 1.2    ------------------------------------------------------------------------------------------------------------------  Cardiac Enzymes  Recent Labs Lab 04/30/15 0500 05/02/15 1933 05/03/15 0747  TROPONINI 0.49* 1.50* 1.20*   ------------------------------------------------------------------------------------------------------------------  RADIOLOGY:  Dg Chest 1 View  05/02/2015  CLINICAL DATA:  Unresponsive patient. Altered mental status. History of stroke, MI, hypertension, diabetes, coronary artery disease, dementia, GERD, CHF, renal disease, previous cardiac catheterization. EXAM: CHEST 1 VIEW COMPARISON:  04/28/2015 FINDINGS: Shallow inspiration. Cardiac enlargement with mild vascular congestion similar to previous study. Linear fibrosis or atelectasis in the left mid lungs. No developing consolidation. No blunting of costophrenic angles. No pneumothorax. Calcified and tortuous aorta. IMPRESSION: Cardiac enlargement with mild vascular congestion. No change since previous study. Electronically Signed   By: Burman Nieves M.D.   On: 05/02/2015 21:09   Ct Head Wo Contrast  05/02/2015  CLINICAL DATA:  Unresponsive patient. Altered mental status. Patient was here with similar complaints on 05/18/2015. EXAM: CT HEAD WITHOUT CONTRAST TECHNIQUE: Contiguous axial images were obtained from the base of the skull through the vertex without intravenous contrast. COMPARISON:  04/28/2015 FINDINGS: Examination is technically limited due to motion artifact. Encephalomalacia in the left occipital lobe consistent with old infarct. Mild diffuse cerebral atrophy. Ventricular dilatation consistent with central atrophy. Low-attenuation changes throughout the deep white matter consistent with small vessel ischemia. Old lacunar infarcts in the basal ganglia. No mass effect or midline shift. No abnormal extra-axial fluid collections. Gray-white matter junctions are distinct. Basal cisterns are not effaced. No  evidence of acute intracranial hemorrhage. No depressed skull fractures. Visualized paranasal sinuses and mastoid air  cells are not opacified. Prominent vascular calcifications. IMPRESSION: Old infarct in the left occipital lobe. Chronic atrophy and small vessel ischemic changes. No acute intracranial abnormalities. Unchanged appearance since previous study. Electronically Signed   By: Burman Nieves M.D.   On: 05/02/2015 21:12     ASSESSMENT AND PLAN:   70 year old female with a history of stroke and diabetes who presents with altered mental status.   1. Acute encephalopathy: Patient presents with lethargy and somnolence. Etiology is unclear. This could be due to advanced dementia. This could be uremic in nature. Patient also appears to have a facial droop which I am not sure if this is old or new. I will order MRI to evaluate for stroke. Continue with neuro checks every 4 hours. I will check TSH, Keppra level and B12.  2. Elevated troponin: I am not suspecting patient has ACS but likely demand ischemia and poor renal clearance. Continue aspirin and Plavix. Follow up on cardiology consultation   3. End-stage renal disease on hemodialysis: Patient will need dialysis Monday, Wednesday and Friday. Nephrology consult placed.  4. Seizure disorder: Continue Keppra and check Keppra level.  5. Diabetes type 2: Continue sliding scale insulin and Accu-Cheks.  6. Hyperkalemia: This is secondary to end-stage renal disease. Potassium was treated in the emergency room. Patient is planned for dialysis today.  7. Severe Aortic stenosis: No plans for internal dimension due to multiple comorbidities and advanced dementia. Patient was seen during last hospital stay by the [facility] cardiology.  8. Wound care team consult placed   Patient has been seen and evaluated by palliative care at the end of January and at that time the family was not ready for hospice. I will reconsult palliative care as this is  patient's third hospitalization over the past several months.   CODE STATUS: DNR  TOTAL TIME TAKING CARE OF THIS PATIENT: 30 minutes.     POSSIBLE D/C ??, DEPENDING ON CLINICAL CONDITION.   Gino Garrabrant M.D on 05/03/2015 at 11:23 AM  Between 7am to 6pm - Pager - [phone] After 6pm go to [email or web address] - password [email or web address] [facility]  [facility]  Office  [phone]  CC: Primary care physician; Amy L Krebs, NP  Note: This dictation was prepared with Dragon dictation along with smaller phrase technology. Any transcriptional errors that result from this process are unintentional.

## 2015-05-03 NOTE — [place] FL2 (Signed)
MEDICAID FL2 LEVEL OF CARE SCREENING TOOL     IDENTIFICATION  Patient Name: [name] Birthdate: [birth year] Sex: female Admission Date (Current Location): 05/02/2015  [place] and [place] Number:  Chiropodist and Address:  [facility], [address], [place], [place] [address]      Provider Number: [id number]  Attending Physician Name and Address:  Adrian Saran, MD  Relative Name and Phone Number:       Current Level of Care: Hospital Recommended Level of Care: Skilled Nursing Facility Prior Approval Number:    Date Approved/Denied:   PASRR Number:  [id number])  Discharge Plan: SNF    Current Diagnoses: Patient Active Problem List   Diagnosis Date Noted  . Confusion 05/03/2015  . Aortic valve disease   . Syncope 04/28/2015  . Malnutrition of moderate degree 04/15/2015  . Acute on chronic systolic CHF (congestive heart failure) (HCC) 04/13/2015  . Elevated troponin 04/13/2015  . Acute encephalopathy 04/13/2015  . CAD (coronary artery disease) 04/13/2015  . GERD (gastroesophageal reflux disease) 04/13/2015  . Pressure ulcer 04/13/2015  . Altered mental status   . Aortic valve stenosis, severe   . Cardiomyopathy, ischemic   . Metabolic encephalopathy   . Respiratory failure (HCC)   . Dyspnea   . Severe aortic valve stenosis   . 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 NSTEMI (non-ST elevated myocardial infarction) (HCC)   . Cardiomyopathy, hypertensive, benign   . Angioedema 02/05/2015  . Acute respiratory failure (HCC) 02/05/2015  . Seizures (HCC) 12/02/2014  . Hyperlipidemia 08/11/2014  . End stage renal disease on dialysis (HCC) 05/12/2014  . Chronic systolic CHF (congestive heart failure) (HCC) 05/12/2014  . Secondary hypertension, unspecified 05/12/2014  . Type 2 diabetes mellitus with other circulatory complications (HCC)  05/12/2014  . Moderate aortic valve stenosis 05/12/2014    Orientation RESPIRATION BLADDER Height & Weight     Self  Normal (Nasal Cannula 2 (L/min) ) Incontinent Weight: 142 lb (64.411 kg) Height:   (160 cm)  BEHAVIORAL SYMPTOMS/MOOD NEUROLOGICAL BOWEL NUTRITION STATUS   (None ) Seizure disorder Incontinent Diet (NPO )  AMBULATORY STATUS COMMUNICATION OF NEEDS Skin   Extensive Assist Verbally Other (Comment) (Pressure Ulcer stage 1 Mid Sacrum & Pressure Ulcer Deep Tissue Injury Bilateral Heel)                       Personal Care Assistance Level of Assistance  Bathing, Feeding, Dressing Bathing Assistance: Limited assistance Feeding assistance: Independent Dressing Assistance: Limited assistance     Functional Limitations Info  Hearing, Sight, Speech Sight Info: Adequate Hearing Info: Adequate Speech Info: Adequate    SPECIAL CARE FACTORS FREQUENCY  PT (By licensed PT)     PT Frequency:  (5)              Contractures      Additional Factors Info  Code Status, Allergies, Insulin Sliding Scale Code Status Info:  (DNR ) Allergies Info:  (Contrast Media )   Insulin Sliding Scale Info:  (Insulin Sliding Scale Info: (insulin aspart (novoLOG) injection 0-5 Units 0-5 Units, Subcutaneous, Daily at bedtime & insulin aspart (novoLOG) injection 0-9 Units 0-9 Units, Subcutaneous, 3 times daily with meals )       Current Medications (05/03/2015):  This is the current hospital active medication list Current Facility-Administered Medications  Medication Dose Route Frequency Provider Last Rate Last  Dose  . acetaminophen (TYLENOL) tablet 650 mg  650 mg Oral Q6H PRN Arnaldo Natal, MD       Or  . acetaminophen (TYLENOL) suppository 650 mg  650 mg Rectal Q6H PRN Arnaldo Natal, MD      . acidophilus (RISAQUAD) capsule 1 capsule  1 capsule Oral Daily Arnaldo Natal, MD   1 capsule at 05/03/15 1018  . albuterol (PROVENTIL) (2.5 MG/3ML) 0.083% nebulizer solution  2.5 mg  2.5 mg Nebulization Once Myrna Blazer, MD      . aspirin EC tablet 81 mg  81 mg Oral Daily Arnaldo Natal, MD      . atorvastatin (LIPITOR) tablet 80 mg  80 mg Oral Daily Arnaldo Natal, MD      . bisacodyl (DULCOLAX) suppository 10 mg  10 mg Rectal Daily PRN Arnaldo Natal, MD      . calcium acetate (PHOSLO) capsule 1,334 mg  1,334 mg Oral TID WC Arnaldo Natal, MD   1,334 mg at 05/03/15 0825  . cholecalciferol (VITAMIN D) tablet 2,000 Units  2,000 Units Oral Daily Arnaldo Natal, MD      . clopidogrel (PLAVIX) tablet 75 mg  75 mg Oral Daily Arnaldo Natal, MD      . docusate sodium (COLACE) capsule 100 mg  100 mg Oral QHS Arnaldo Natal, MD      . famotidine (PEPCID) tablet 20 mg  20 mg Oral QHS Arnaldo Natal, MD      . feeding supplement (NEPRO CARB STEADY) liquid 237 mL  237 mL Oral BID BM Arnaldo Natal, MD      . heparin ADULT infusion 100 units/mL (25000 units/250 mL)  800 Units/hr Intravenous Continuous Arnaldo Natal, MD 8 mL/hr at 05/03/15 1624 800 Units/hr at 05/03/15 1624  . insulin aspart (novoLOG) injection 0-5 Units  0-5 Units Subcutaneous QHS Sital Mody, MD      . insulin aspart (novoLOG) injection 0-9 Units  0-9 Units Subcutaneous TID WC Sital Mody, MD      . iron polysaccharides (NIFEREX) capsule 150 mg  150 mg Oral Daily Arnaldo Natal, MD      . levETIRAcetam (KEPPRA) tablet 500 mg  500 mg Oral Q M,W,F-2000 Arnaldo Natal, MD      . levETIRAcetam (KEPPRA) tablet 750 mg  750 mg Oral Q24H Arnaldo Natal, MD   750 mg at 05/03/15 0302  . lidocaine-prilocaine (EMLA) cream 1 application  1 application Topical PRN Arnaldo Natal, MD      . morphine 2 MG/ML injection 2 mg  2 mg Intravenous Q4H PRN Arnaldo Natal, MD      . ondansetron [facility]) tablet 4 mg  4 mg Oral Q6H PRN Arnaldo Natal, MD       Or  . ondansetron [facility]) injection 4 mg  4 mg Intravenous Q6H PRN Arnaldo Natal, MD          Discharge  Medications: Please see discharge summary for a list of discharge medications.  Relevant Imaging Results:  Relevant Lab Results:   Additional Information  (SSN [id number])  Verta Ellen Sunkins, [name]

## 2015-05-03 NOTE — Progress Notes (Signed)
PHARMACIST - PHYSICIAN ORDER COMMUNICATION  CONCERNING: P&T Medication Policy on Herbal Medications  DESCRIPTION:  This patient's order for:  cranberry  has been noted.  This product(s) is classified as an "herbal" or natural product. Due to a lack of definitive safety studies or FDA approval, nonstandard manufacturing practices, plus the potential risk of unknown drug-drug interactions while on inpatient medications, the Pharmacy and Therapeutics Committee does not permit the use of "herbal" or natural products of this type within [place].   ACTION TAKEN: The pharmacy department is unable to verify this order at this time  Please reevaluate patient's clinical condition at discharge and address if the herbal or natural product(s) should be resumed at that time.   

## 2015-05-03 NOTE — Progress Notes (Signed)
Notified pharmacy of heparin drip restart time.

## 2015-05-04 LAB — HEPARIN LEVEL (UNFRACTIONATED): HEPARIN UNFRACTIONATED: 0.18 [IU]/mL — AB (ref 0.30–0.70)

## 2015-05-04 LAB — BASIC METABOLIC PANEL
Anion gap: 12 (ref 5–15)
BUN: 49 mg/dL — AB (ref 6–20)
CALCIUM: 8.3 mg/dL — AB (ref 8.9–10.3)
CO2: 27 mmol/L (ref 22–32)
Chloride: 98 mmol/L — ABNORMAL LOW (ref 101–111)
Creatinine, Ser: 5.03 mg/dL — ABNORMAL HIGH (ref 0.44–1.00)
GFR calc Af Amer: 9 mL/min — ABNORMAL LOW (ref >60)
GFR, EST NON AFRICAN AMERICAN: 8 mL/min — AB (ref >60)
Glucose, Bld: 109 mg/dL — ABNORMAL HIGH (ref 65–99)
POTASSIUM: 5.3 mmol/L — AB (ref 3.5–5.1)
SODIUM: 137 mmol/L (ref 135–145)

## 2015-05-04 LAB — GLUCOSE, CAPILLARY
Glucose-Capillary: 101 mg/dL — ABNORMAL HIGH (ref 65–99)
Glucose-Capillary: 112 mg/dL — ABNORMAL HIGH (ref 65–99)
Glucose-Capillary: 232 mg/dL — ABNORMAL HIGH (ref 65–99)
Glucose-Capillary: 177 mg/dL — ABNORMAL HIGH (ref 65–99)
Glucose-Capillary: 180 mg/dL — ABNORMAL HIGH (ref 65–99)

## 2015-05-04 MED ORDER — CARVEDILOL 3.125 MG PO TABS: 3.1250 mg | ORAL_TABLET | Freq: Two times a day (BID) | ORAL | Status: DC

## 2015-05-04 MED ORDER — INSULIN ASPART 100 UNIT/ML ~~LOC~~ SOLN
3.0000 [IU] | Freq: Once | SUBCUTANEOUS | Status: AC
  Administered 2015-05-04: 3 [IU] via SUBCUTANEOUS
  Filled 2015-05-04: qty 3

## 2015-05-04 MED ORDER — SODIUM POLYSTYRENE SULFONATE 15 GM/60ML PO SUSP
30.0000 g | Freq: Once | ORAL | Status: AC
  Administered 2015-05-04: 30 g via ORAL
  Filled 2015-05-04: qty 120

## 2015-05-04 MED ORDER — HEPARIN BOLUS VIA INFUSION
2000.0000 [IU] | Freq: Once | INTRAVENOUS | Status: AC
  Administered 2015-05-04: 2000 [IU] via INTRAVENOUS
  Filled 2015-05-04: qty 2000

## 2015-05-04 NOTE — Progress Notes (Signed)
ANTICOAGULATION CONSULT NOTE - Follow up Consult  Pharmacy Consult for heparin drip Indication: ACS/STEMI  Allergies  Allergen Reactions  . Contrast Media [Iodinated Diagnostic Agents] Anaphylaxis    Patient Measurements: Height:  (160 cm) Weight: 142 lb (64.411 kg) IBW/kg (Calculated) : 52.4 Heparin Dosing Weight: 70.3kg  Vital Signs: Temp: 97.8 F (36.6 C) (02/13 2128) Temp Source: Oral (02/13 2128) BP: 124/68 mmHg (02/13 2128) Pulse Rate: 81 (02/13 2128)  Labs:  Recent Labs  05/02/15 1933 05/03/15 0018 05/03/15 0747 05/03/15 1157 05/03/15 1940 05/04/15 0118  HGB 12.5  --   --  10.9*  --   --   HCT 38.6  --   --  33.4*  --   --   PLT 173  --   --  173  --   --   APTT  --  32  --   --   --   --   LABPROT  --  18.2*  --   --   --   --   INR  --  1.50  --   --   --   --   HEPARINUNFRC  --   --   --   --   --  0.18*  CREATININE 5.74*  --   --  6.62*  --  5.03*  TROPONINI 1.50*  --  1.20*  --  1.05*  --     Estimated Creatinine Clearance: 9.5 mL/min (by C-G formula based on Cr of 5.03).   Assessment: 70 yo female here with elevated troponins.   Hgb 12.5  plt 173 INR 1.5  aPTT 32  Goal of Therapy:  Heparin level 0.3-0.7 units/ml Monitor platelets by anticoagulation protocol: Yes   Plan:  Heparin level subtherapeutic. 2000 unit IV x 1 bolus and increase rate to 1000 units/hr. Will recheck level in 8 hours.  Carola Frost, Pharm.D., BCPS Clinical Pharmacist 05/04/2015,2:26 AM

## 2015-05-04 NOTE — Discharge Summary (Addendum)
[facility] [place] [facility]   PATIENT NAME: [name]    MR#:  [id number]  DATE OF BIRTH:  19-Mar-1946  DATE OF ADMISSION:  05/02/2015 ADMITTING PHYSICIAN: Arnaldo Natal, MD  DATE OF DISCHARGE: 05/04/2015  PRIMARY CARE PHYSICIAN: Amy Diana Eves, NP    ADMISSION DIAGNOSIS:  Hyperkalemia [E87.5] NSTEMI (non-ST elevated myocardial infarction) (HCC) [I21.4] Altered mental status, unspecified altered mental status type [R41.82]  DISCHARGE DIAGNOSIS:  Active Problems:   Confusion   SECONDARY DIAGNOSIS:   Past Medical History  Diagnosis Date  . Stroke (HCC)   . MI (myocardial infarction) (HCC)   . Hearing impaired   . Hyperlipidemia   . Hypertension   . Diabetes mellitus without complication (HCC)   . Depression   . Coronary artery disease   . Dementia   . Secondary hyperparathyroidism (HCC)   . Chronic systolic CHF (congestive heart failure) (HCC)   . GERD (gastroesophageal reflux disease)   . Personal history of transient ischemic attack (TIA) and cerebral infarction without residual deficit   . Anemia   . Moderate aortic stenosis     a. echo 04/2014: 40-50%, moderate AS b. echo 01/2015: EF 35-40%, Severe AS, mild MR, moderate TR  . ESRD (end stage renal disease) (HCC)     a. Dialysis MWF    HOSPITAL COURSE:    70 year old female with a history of stroke and diabetes who presents with altered mental status.   1. Acute encephalopathy: Patient presentedwith lethargy and somnolence. Etiology is due to advanced dementia. MRI did not show CVA and TSH was normal. She appears close to her baseline.   2. Elevated troponin: This is due to demand ischemia and poor renal clearance. Continue aspirin and Plavix and a low dose COREG was started by Cardiology.   3. End-stage renal disease on hemodialysis: Patient will continue dialysis Monday, Wednesday and Friday. 4. Seizure disorder: Continue Keppra. No observed seizures noted.  5.  Diabetes type 2: Continue ADA diet.  6. Hyperkalemia: This is secondary to end-stage renal disease.  7. Severe Aortic stenosis: No plans for interevention due to multiple comorbidities and advanced dementia.  8.  Blanchable redness to bilateral heels POA, Cleanse bilateral heels with soap and water daily. Offload pressure to heels with Prevalon boot while in bed.   DISCHARGE CONDITIONS AND DIET:  DYSPHAGIA 1 diet  Stable needs HOSPICE EVAL at SNF  CONSULTS OBTAINED:  Treatment Team:  Lamont Dowdy, MD  DRUG ALLERGIES:   Allergies  Allergen Reactions  . Contrast Media [Iodinated Diagnostic Agents] Anaphylaxis    DISCHARGE MEDICATIONS:   Current Discharge Medication List    START taking these medications   Details  carvedilol (COREG) 3.125 MG tablet Take 1 tablet (3.125 mg total) by mouth 2 (two) times daily with a meal. Qty: 60 tablet, Refills: 0      CONTINUE these medications which have NOT CHANGED   Details  aspirin EC 81 MG tablet Take 81 mg by mouth daily.    atorvastatin (LIPITOR) 80 MG tablet Take 1 tablet (80 mg total) by mouth daily. Qty: 30 tablet, Refills: 1    bisacodyl (DULCOLAX) 10 MG suppository Place 1 suppository (10 mg total) rectally daily as needed for moderate constipation. Qty: 12 suppository, Refills: 0    calcium acetate (PHOSLO) 667 MG capsule Take 1,334 mg by mouth 3 (three) times daily with meals.     cholecalciferol (VITAMIN D) 1000 units tablet Take 2,000 Units by mouth daily.  clopidogrel (PLAVIX) 75 MG tablet Take 1 tablet (75 mg total) by mouth daily. Qty: 30 tablet, Refills: 3    Cranberry 450 MG TABS Take 450 mg by mouth 2 (two) times daily.    docusate sodium (COLACE) 100 MG capsule Take 100 mg by mouth at bedtime.     insulin glargine (LANTUS) 100 UNIT/ML injection Inject 5 Units into the skin daily.    insulin lispro (HUMALOG) 100 UNIT/ML injection Inject 0-9 Units into the skin 4 (four) times daily -  before meals and  at bedtime. Pt uses as needed per sliding scale:    0-99:  0 units  100-149:  2 units  150-199:  3 units  200-249:  4 units  250-299:  5 units  300-349:  6 units 350-399:  7 units 400-449:  8 units 450-499:  9 units  500 or greater:  Call MD    iron polysaccharides (NIFEREX) 150 MG capsule Take 150 mg by mouth daily.    lactobacillus acidophilus (BACID) TABS tablet Take 1 tablet by mouth daily.     !! levETIRAcetam (KEPPRA) 500 MG tablet Take 1 tablet (500 mg total) by mouth every Monday, Wednesday, and Friday at 8 PM. Qty: 30 tablet, Refills: 1    !! levETIRAcetam (KEPPRA) 750 MG tablet Take 1 tablet (750 mg total) by mouth daily. Qty: 30 tablet, Refills: 1    lidocaine-prilocaine (EMLA) cream Apply 1 application topically as needed (prior to dialysis on Monday, Wednesday, and Friday).    Nutritional Supplements (FEEDING SUPPLEMENT, NEPRO CARB STEADY,) LIQD Take 237 mLs by mouth 2 (two) times daily between meals. Qty: 60 Can, Refills: 0    ranitidine (ZANTAC) 150 MG tablet Take 1 tablet (150 mg total) by mouth at bedtime. Qty: 90 tablet, Refills: 3     !! - Potential duplicate medications found. Please discuss with provider.            Today   CHIEF COMPLAINT:   nonverbal   VITAL SIGNS:  Blood pressure 133/88, pulse 85, temperature 97.7 F (36.5 C), temperature source Oral, resp. rate 20, height 5\' 3"  (1.6 m), weight 65.137 kg (143 lb 9.6 oz), SpO2 100 %.   REVIEW OF SYSTEMS:  Review of Systems  Unable to perform ROS    PHYSICAL EXAMINATION:  GENERAL:  70 y.o.-year-old patient lying in the bed with no acute distress.  NECK:  Supple, no jugular venous distention. No thyroid enlargement, no tenderness.  LUNGS: Normal breath sounds bilaterally, no wheezing, rales,rhonchi  No use of accessory muscles of respiration.  CARDIOVASCULAR: S1, S2 normal.3/5 SEM no rubs, or gallops.  ABDOMEN: Soft, non-tender, non-distended. Bowel sounds present. No organomegaly  or mass.  EXTREMITIES: No pedal edema, cyanosis, or clubbing.  PSYCHIATRIC: The patient is alert   Does not follow commands  DATA REVIEW:   CBC  Recent Labs Lab 05/03/15 1157  WBC 4.0  HGB 10.9*  HCT 33.4*  PLT 173    Chemistries   Recent Labs Lab 05/02/15 1933  05/04/15 0118  NA 138  < > 137  K 6.3*  < > 5.3*  CL 92*  < > 98*  CO2 26  < > 27  GLUCOSE 209*  < > 109*  BUN 59*  < > 49*  CREATININE 5.74*  < > 5.03*  CALCIUM 9.8  < > 8.3*  AST 177*  --   --   ALT 150*  --   --   ALKPHOS 227*  --   --  BILITOT 1.2  --   --   < > = values in this interval not displayed.  Cardiac Enzymes  Recent Labs Lab 05/02/15 1933 05/03/15 0747 05/03/15 1940  TROPONINI 1.50* 1.20* 1.05*    Microbiology Results  @  RADIOLOGY:  Dg Chest 1 View  05/02/2015  CLINICAL DATA:  Unresponsive patient. Altered mental status. History of stroke, MI, hypertension, diabetes, coronary artery disease, dementia, GERD, CHF, renal disease, previous cardiac catheterization. EXAM: CHEST 1 VIEW COMPARISON:  04/28/2015 FINDINGS: Shallow inspiration. Cardiac enlargement with mild vascular congestion similar to previous study. Linear fibrosis or atelectasis in the left mid lungs. No developing consolidation. No blunting of costophrenic angles. No pneumothorax. Calcified and tortuous aorta. IMPRESSION: Cardiac enlargement with mild vascular congestion. No change since previous study. Electronically Signed   By: Burman Nieves M.D.   On: 05/02/2015 21:09   Ct Head Wo Contrast  05/02/2015  CLINICAL DATA:  Unresponsive patient. Altered mental status. Patient was here with similar complaints on 05/18/2015. EXAM: CT HEAD WITHOUT CONTRAST TECHNIQUE: Contiguous axial images were obtained from the base of the skull through the vertex without intravenous contrast. COMPARISON:  04/28/2015 FINDINGS: Examination is technically limited due to motion artifact. Encephalomalacia in the left occipital lobe  consistent with old infarct. Mild diffuse cerebral atrophy. Ventricular dilatation consistent with central atrophy. Low-attenuation changes throughout the deep white matter consistent with small vessel ischemia. Old lacunar infarcts in the basal ganglia. No mass effect or midline shift. No abnormal extra-axial fluid collections. Gray-white matter junctions are distinct. Basal cisterns are not effaced. No evidence of acute intracranial hemorrhage. No depressed skull fractures. Visualized paranasal sinuses and mastoid air cells are not opacified. Prominent vascular calcifications. IMPRESSION: Old infarct in the left occipital lobe. Chronic atrophy and small vessel ischemic changes. No acute intracranial abnormalities. Unchanged appearance since previous study. Electronically Signed   By: Burman Nieves M.D.   On: 05/02/2015 21:12   Mr Brain Wo Contrast  05/03/2015  CLINICAL DATA:  Acute presentation with altered mental status yesterday. Somnolent. Unresponsive. EXAM: MRI HEAD WITHOUT CONTRAST TECHNIQUE: Multiplanar, multiecho pulse sequences of the brain and surrounding structures were obtained without intravenous contrast. COMPARISON:  CT 05/02/2015.  MRI 03/25/2012. FINDINGS: Diffusion imaging does not show any acute or subacute infarction. There are old small vessel ischemic changes affecting the pons. Few old small vessel cerebellar infarctions. Within the cerebral hemispheres, there are old cortical infarctions in the left frontal, occipital and parietal region. There chronic small-vessel ischemic changes affecting the thalami, basal ganglia and hemispheric white matter. The ventricles are prominent, consistent with central atrophy. No suspicion of obstructive hydrocephalus. No neoplastic mass lesion, hemorrhage or extra-axial collection. No pituitary mass. No inflammatory sinus disease. No skull or skullbase lesion. Major vessels at the base of the brain show flow. IMPRESSION: No acute or reversible  process. Atrophy and chronic small vessel ischemic changes throughout the brain. Old cortical infarctions in the left occipital, parietal and posterior frontal regions. Electronically Signed   By: Paulina Fusi M.D.   On: 05/03/2015 11:42      Management plans discussed with the patient's daughter and she is in agreement. Stable for discharge SNF  Patient should follow up with PCP in 1 week  CODE STATUS:     Code Status Orders        Start     Ordered   05/03/15 0226  Do not attempt resuscitation (DNR)   Continuous    Question Answer Comment  In the event of  cardiac or respiratory ARREST Do not call a "code blue"   In the event of cardiac or respiratory ARREST Do not perform Intubation, CPR, defibrillation or ACLS   In the event of cardiac or respiratory ARREST Use medication by any route, position, wound care, and other measures to relive pain and suffering. May use oxygen, suction and manual treatment of airway obstruction as needed for comfort.      05/03/15 0225    Code Status History    Date Active Date Inactive Code Status Order ID Comments User Context   04/28/2015  3:13 PM 04/30/2015  9:07 PM DNR [id number]  Wyatt Haste, MD ED   04/13/2015  6:18 AM 04/21/2015  7:12 PM Full Code [id number]  Oralia Manis, MD Inpatient   02/04/2015  5:09 PM 02/23/2015  9:12 PM Full Code [id number]  Altamese Dilling, MD Inpatient   02/04/2015 12:22 PM 02/04/2015  5:09 PM Full Code [id number]  Annice Needy, MD Inpatient   12/02/2014  5:37 PM 12/05/2014  6:47 PM Full Code [id number]  Houston Siren, MD Inpatient    Advance Directive Documentation        Most Recent Value   Type of Advance Directive  Out of facility DNR (pink MOST or yellow form)   Pre-existing out of facility DNR order (yellow form or pink MOST form)  Yellow form placed in chart (order not valid for inpatient use)   "MOST" Form in Place?        TOTAL TIME TAKING CARE OF THIS PATIENT: 35 minutes.    Note: This dictation was  prepared with Dragon dictation along with smaller phrase technology. Any transcriptional errors that result from this process are unintentional.  Skyann Ganim M.D on 05/04/2015 at 10:11 AM  Between 7am to 6pm - Pager - [phone] After 6pm go to [email or web address] - password [email or web address] [facility]  [facility]  Office  [phone]  CC: Primary care physician; Amy Diana Eves, NP

## 2015-05-04 NOTE — Progress Notes (Signed)
Updated pt daughter on criteria needed for pt to be discharged. Pt needs to be able to sit up in chair for dialysis.  Pt daughter shared she would come to the hospital after picking her children up from school.  Did not see pt daughter today. Continue to assess.

## 2015-05-04 NOTE — Consult Note (Signed)
WOC wound consult note Reason for Consult: Bilateral unstageable heel pressure injury, present on admission.  Wound type:Chronic unstagebale pressure injury.  Scabbed lessions to lateral heels, bilateral Pressure Ulcer POA: Yes Measurement:Left heel 2 cm x 2.5 cm scabbed lesion.  Dry and intact Right lateral heel 2.5 cm x 2 cm scabbed lesion  Dry and intact Wound ZOX:WRUEAVW dry lesion Drainage (amount, consistency, odor) none noted Periwound: Blanchable redness to bilateral heels.  Will offload with Prevalon boots while in bed.  Dressing procedure/placement/frequency:Cleanse bilateral heels with soap and water daily.  Offload pressure to heels with Prevalon boot while in bed. Will discharge to SNF.  Will not follow at this time.  Please re-consult if needed.  Maple Hudson RN BSN CWON Pager [phone]

## 2015-05-04 NOTE — Clinical Social Work Note (Signed)
MD was to discharge patient today to return to [facility] however it was unknown if patient could sit up for the duration of her dialysis in a chair. Patient is to have dialysis tomorrow and staff here will sit patient in a chair to determine if she can do this.  York Spaniel MSW,[name] [phone]

## 2015-05-04 NOTE — Care Management Note (Signed)
Patient is active at outpatient dialysis [facility] [address] MWF.  Clinic notified of of recent admission.  Will send additional medical records at discharge.  Ivor Reining  Dialysis Coordinator  [phone]

## 2015-05-04 NOTE — Progress Notes (Signed)
[facility]  ROUNDING NOTE   Subjective:   Hemodialysis yesterday. Tolerated treatment well. UF of 0.5 litre  K 5.3   Heparin gtt  Objective:  Vital signs in last 24 hours:  Temp:  [97.7 F (36.5 C)-97.8 F (36.6 C)] 97.7 F (36.5 C) (02/14 0520) Pulse Rate:  [70-87] 85 (02/14 0520) Resp:  [17-26] 20 (02/14 0520) BP: (110-133)/(68-88) 133/88 mmHg (02/14 0520) SpO2:  [100 %] 100 % (02/14 0520) Weight:  [65.137 kg (143 lb 9.6 oz)] 65.137 kg (143 lb 9.6 oz) (02/14 0500)  Weight change: -5.171 kg (-11 lb 6.4 oz) Filed Weights   05/02/15 1941 05/03/15 0210 05/04/15 0500  Weight: 70.308 kg (155 lb) 64.411 kg (142 lb) 65.137 kg (143 lb 9.6 oz)    Intake/Output: I/O last 3 completed shifts: In: 178.8 [I.V.:178.8] Out: 503 [Urine:3; Other:500]   Intake/Output this shift:     Physical Exam: General: NAD, laying in bed.   Head: Normocephalic, atraumatic. Moist oral mucosal membranes  Eyes: Anicteric, PERRL  Neck: Supple, trachea midline  Lungs:  Clear to auscultation  Heart: S1S2 no rubs  Abdomen:  Soft, nontender, BS present   Extremities: No peripheral edema.  Neurologic: Awake, not following commands  Skin: No lesions  Access: LUE AVG    Basic Metabolic Panel:  Recent Labs Lab 04/29/15 2001 04/30/15 1302 05/02/15 1040 05/02/15 1933 05/03/15 1157 05/04/15 0118  NA  --  137 136 138 136 137  K  --  4.5 6.5* 6.3* 6.3* 5.3*  CL  --  94* 97* 92* 96* 98*  CO2  --  32 GLUCOSE  --  145* 125* 209* 133* 109*  BUN  --  56* 54* 59* 66* 49*  CREATININE  --  5.64* 5.21* 5.74* 6.62* 5.03*  CALCIUM  --  9.5 9.2 9.8 8.9 8.3*  PHOS 3.0 2.7  --   --  4.4  --     Liver Function Tests:  Recent Labs Lab 04/28/15 1209 04/30/15 1302 05/02/15 1933 05/03/15 1157  AST 28  --  177*  --   ALT 23  --  150*  --   ALKPHOS 110  --  227*  --   BILITOT 0.8  --  1.2  --   PROT 7.4  --  7.6  --   ALBUMIN 3.6 2.8* 3.6 3.0*    Recent Labs Lab  05/02/15 1933  LIPASE 20    Recent Labs Lab 05/02/15 1952  AMMONIA 24    CBC:  Recent Labs Lab 04/28/15 1209 04/29/15 0529 04/30/15 1302 05/02/15 1933 05/03/15 1157  WBC 6.3 4.5 4.0 4.2 4.0  NEUTROABS 5.3  --   --  3.0  --   HGB 12.4 11.9* 9.6* 12.5 10.9*  HCT 38.8 37.3 29.5* 38.6 33.4*  MCV 96.7 96.6 95.9 97.6 95.1  PLT 259 237 198 173 173    Cardiac Enzymes:  Recent Labs Lab 04/30/15 0032 04/30/15 0500 05/02/15 1933 05/03/15 0747 05/03/15 1940  TROPONINI 0.37* 0.49* 1.50* 1.20* 1.05*    BNP: Invalid input(s): POCBNP  CBG:  Recent Labs Lab 04/30/15 1800 05/03/15 0929 05/03/15 1630 05/03/15 2208 05/04/15 0737  GLUCAP 200* 136* 122* 136* 101*    Microbiology: Results for orders placed or performed during the hospital encounter of 04/13/15  Culture, blood (routine x 2)     Status: None   Collection Time: 04/13/15  2:26 AM  Result Value Ref Range Status   Specimen Description BLOOD  BLOOD RIGHT FOREARM  Final   Special Requests BOTTLES DRAWN AEROBIC AND ANAEROBIC  Final   Culture NO GROWTH 5 DAYS  Final   Report Status 04/18/2015 FINAL  Final  Culture, blood (routine x 2)     Status: None   Collection Time: 04/13/15  2:50 AM  Result Value Ref Range Status   Specimen Description BLOOD RIGHT HAND  Final   Special Requests BOTTLES DRAWN AEROBIC AND ANAEROBIC  Final   Culture NO GROWTH 5 DAYS  Final   Report Status 04/18/2015 FINAL  Final  MRSA PCR Screening     Status: None   Collection Time: 04/13/15  6:18 AM  Result Value Ref Range Status   MRSA by PCR NEGATIVE NEGATIVE Final    Comment:        The GeneXpert MRSA Assay (FDA approved for NASAL specimens only), is one component of a comprehensive MRSA colonization surveillance program. It is not intended to diagnose MRSA infection nor to guide or monitor treatment for MRSA infections.     Coagulation Studies:  Recent Labs  05/03/15 0018  LABPROT 18.2*  INR 1.50     Urinalysis:  Recent Labs  05/02/15 2257  COLORURINE AMBER*  LABSPEC 1.020  PHURINE 5.0  GLUCOSEU 50*  HGBUR 1+*  BILIRUBINUR NEGATIVE  KETONESUR TRACE*  PROTEINUR >500*  NITRITE NEGATIVE  LEUKOCYTESUR 2+*      Imaging: Dg Chest 1 View  05/02/2015  CLINICAL DATA:  Unresponsive patient. Altered mental status. History of stroke, MI, hypertension, diabetes, coronary artery disease, dementia, GERD, CHF, renal disease, previous cardiac catheterization. EXAM: CHEST 1 VIEW COMPARISON:  04/28/2015 FINDINGS: Shallow inspiration. Cardiac enlargement with mild vascular congestion similar to previous study. Linear fibrosis or atelectasis in the left mid lungs. No developing consolidation. No blunting of costophrenic angles. No pneumothorax. Calcified and tortuous aorta. IMPRESSION: Cardiac enlargement with mild vascular congestion. No change since previous study. Electronically Signed   By: Burman Nieves M.D.   On: 05/02/2015 21:09   Ct Head Wo Contrast  05/02/2015  CLINICAL DATA:  Unresponsive patient. Altered mental status. Patient was here with similar complaints on 05/18/2015. EXAM: CT HEAD WITHOUT CONTRAST TECHNIQUE: Contiguous axial images were obtained from the base of the skull through the vertex without intravenous contrast. COMPARISON:  04/28/2015 FINDINGS: Examination is technically limited due to motion artifact. Encephalomalacia in the left occipital lobe consistent with old infarct. Mild diffuse cerebral atrophy. Ventricular dilatation consistent with central atrophy. Low-attenuation changes throughout the deep white matter consistent with small vessel ischemia. Old lacunar infarcts in the basal ganglia. No mass effect or midline shift. No abnormal extra-axial fluid collections. Gray-white matter junctions are distinct. Basal cisterns are not effaced. No evidence of acute intracranial hemorrhage. No depressed skull fractures. Visualized paranasal sinuses and mastoid air cells are not  opacified. Prominent vascular calcifications. IMPRESSION: Old infarct in the left occipital lobe. Chronic atrophy and small vessel ischemic changes. No acute intracranial abnormalities. Unchanged appearance since previous study. Electronically Signed   By: Burman Nieves M.D.   On: 05/02/2015 21:12   Mr Brain Wo Contrast  05/03/2015  CLINICAL DATA:  Acute presentation with altered mental status yesterday. Somnolent. Unresponsive. EXAM: MRI HEAD WITHOUT CONTRAST TECHNIQUE: Multiplanar, multiecho pulse sequences of the brain and surrounding structures were obtained without intravenous contrast. COMPARISON:  CT 05/02/2015.  MRI 03/25/2012. FINDINGS: Diffusion imaging does not show any acute or subacute infarction. There are old small vessel ischemic changes affecting the pons. Few old small vessel  cerebellar infarctions. Within the cerebral hemispheres, there are old cortical infarctions in the left frontal, occipital and parietal region. There chronic small-vessel ischemic changes affecting the thalami, basal ganglia and hemispheric white matter. The ventricles are prominent, consistent with central atrophy. No suspicion of obstructive hydrocephalus. No neoplastic mass lesion, hemorrhage or extra-axial collection. No pituitary mass. No inflammatory sinus disease. No skull or skullbase lesion. Major vessels at the base of the brain show flow. IMPRESSION: No acute or reversible process. Atrophy and chronic small vessel ischemic changes throughout the brain. Old cortical infarctions in the left occipital, parietal and posterior frontal regions. Electronically Signed   By: Paulina Fusi M.D.   On: 05/03/2015 11:42     Medications:     . acidophilus  1 capsule Oral Daily  . albuterol  2.5 mg Nebulization Once  . aspirin EC  81 mg Oral Daily  . atorvastatin  80 mg Oral Daily  . calcium acetate  1,334 mg Oral TID WC  . cholecalciferol  2,000 Units Oral Daily  . clopidogrel  75 mg Oral Daily  . docusate  sodium  100 mg Oral QHS  . famotidine  20 mg Oral QHS  . feeding supplement (NEPRO CARB STEADY)  237 mL Oral BID BM  . insulin aspart  0-5 Units Subcutaneous QHS  . insulin aspart  0-9 Units Subcutaneous TID WC  . iron polysaccharides  150 mg Oral Daily  . levETIRAcetam  500 mg Oral Q M,W,F-2000  . levETIRAcetam  750 mg Oral Q24H  . sodium polystyrene  30 g Oral Once   acetaminophen **OR** acetaminophen, bisacodyl, lidocaine-prilocaine, morphine injection, ondansetron **OR** ondansetron (ZOFRAN) IV  Assessment/ Plan:  70 y.o. 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   [facility] MWF [facility] [address]   1. End-stage renal disease on hemodialysis: Continue MWF schedule. Monitor potassium  2.  Anemia chronic kidney disease.  Hemoglobin 10.9 - Hold off on erythropoietin stimulating agents with concern of ischemic event.  - iron supplements.  3. Hypertension: blood pressure at goal.   4.  Secondary hyperparathyroidism: phos 4.4. PTh 250. Not currently on any vitamin D agents.  - Continue calcium acetate for binding   LOS:  Nunzio Banet 2/14/201711:04 AM

## 2015-05-05 LAB — RENAL FUNCTION PANEL
Albumin: 2.9 g/dL — ABNORMAL LOW (ref 3.5–5.0)
Anion gap: 14 (ref 5–15)
BUN: 62 mg/dL — AB (ref 6–20)
CALCIUM: 8.4 mg/dL — AB (ref 8.9–10.3)
CHLORIDE: 97 mmol/L — AB (ref 101–111)
CO2: 29 mmol/L (ref 22–32)
CREATININE: 6.58 mg/dL — AB (ref 0.44–1.00)
GFR, EST AFRICAN AMERICAN: 7 mL/min — AB (ref >60)
GFR, EST NON AFRICAN AMERICAN: 6 mL/min — AB (ref >60)
Glucose, Bld: 209 mg/dL — ABNORMAL HIGH (ref 65–99)
Phosphorus: 4.6 mg/dL (ref 2.5–4.6)
Potassium: 4.8 mmol/L (ref 3.5–5.1)
SODIUM: 140 mmol/L (ref 135–145)

## 2015-05-05 LAB — CBC
HCT: 32.2 % — ABNORMAL LOW (ref 35.0–47.0)
Hemoglobin: 10.3 g/dL — ABNORMAL LOW (ref 12.0–16.0)
MCH: 31.5 pg (ref 26.0–34.0)
MCHC: 32 g/dL (ref 32.0–36.0)
MCV: 98.4 fL (ref 80.0–100.0)
PLATELETS: 153 10*3/uL (ref 150–440)
RBC: 3.27 MIL/uL — AB (ref 3.80–5.20)
RDW: 22.2 % — AB (ref 11.5–14.5)
WBC: 4.1 10*3/uL (ref 3.6–11.0)

## 2015-05-05 LAB — LEVETIRACETAM LEVEL
LEVETIRACETAM: 25.9 ug/mL (ref 10.0–40.0)
LEVETIRACETAM: 7.8 ug/mL — AB (ref 10.0–40.0)

## 2015-05-05 LAB — GLUCOSE, CAPILLARY
Glucose-Capillary: 174 mg/dL — ABNORMAL HIGH (ref 65–99)
Glucose-Capillary: 224 mg/dL — ABNORMAL HIGH (ref 65–99)

## 2015-05-05 NOTE — Progress Notes (Signed)
Hemodialysis completed. 

## 2015-05-05 NOTE — Progress Notes (Signed)
Patient tolerated dialyzing in a dialysis chair for 3hours with no complications.

## 2015-05-05 NOTE — Progress Notes (Signed)
[facility] - [place] at [facility]   PATIENT NAME: Ely Spragg    MR#:  [id number]  DATE OF BIRTH:  [birth year]  SUBJECTIVE:   Patient could not be discharged to skilled nursing facility yesterday. She needs to sit up for dialysis for 3-4 hours.  REVIEW OF SYSTEMS:    Review of Systems  Unable to perform ROS   Tolerating Diet: Yes     DRUG ALLERGIES:   Allergies  Allergen Reactions  . Contrast Media [Iodinated Diagnostic Agents] Anaphylaxis    VITALS:  Blood pressure 106/72, pulse 75, temperature 98.4 F (36.9 C), temperature source Oral, resp. rate 19, height  (1.6 m), weight 67.178 kg (148 lb 1.6 oz), SpO2 100 %.  PHYSICAL EXAMINATION:   Physical Exam  Constitutional: She is well-developed, well-nourished, and in no distress. No distress.  HENT:  Head: Normocephalic.  Eyes: No scleral icterus.  Neck: No JVD present. No tracheal deviation present.  Cardiovascular: Normal rate, regular rhythm and normal heart sounds.  Exam reveals no gallop and no friction rub.   No murmur heard. Pulmonary/Chest: Effort normal and breath sounds normal. No respiratory distress. She has no wheezes. She has no rales. She exhibits no tenderness.  Abdominal: Soft. Bowel sounds are normal. She exhibits no distension and no mass. There is no tenderness. There is no rebound and no guarding.  Musculoskeletal: Normal range of motion. She exhibits no edema.  Neurological: She is alert.  Skin: Skin is warm. No rash noted. No erythema.      LABORATORY PANEL:   CBC  Recent Labs Lab 05/03/15 1157  WBC 4.0  HGB 10.9*  HCT 33.4*  PLT 173   ------------------------------------------------------------------------------------------------------------------  Chemistries   Recent Labs Lab 05/02/15 1933  05/05/15 1042  NA 138  < > 140  K 6.3*  < > 4.8  CL 92*  < > 97*  CO2 26  < > 29  GLUCOSE 209*  < > 209*  BUN 59*  < > 62*  CREATININE 5.74*  < >  6.58*  CALCIUM 9.8  < > 8.4*  AST 177*  --   --   ALT 150*  --   --   ALKPHOS 227*  --   --   BILITOT 1.2  --   --   < > = values in this interval not displayed. ------------------------------------------------------------------------------------------------------------------  Cardiac Enzymes  Recent Labs Lab 05/02/15 1933 05/03/15 0747 05/03/15 1940  TROPONINI 1.50* 1.20* 1.05*   ------------------------------------------------------------------------------------------------------------------  RADIOLOGY:  No results found.   ASSESSMENT AND PLAN:   70 year old female with a history of stroke and diabetes who presents with altered mental status.   1. Acute encephalopathy: Patient presented with lethargy and somnolence. Etiology is due to advanced dementia. MRI did not show CVA and TSH was normal.   2. Elevated troponin: This is due to demand ischemia and poor renal clearance. Continue aspirin and Plavix and a low dose COREG was started by Cardiology.   3. End-stage renal disease on hemodialysis: Patient will continue dialysis Monday, Wednesday and Friday. She needs to be able to sit up for dialysis as an outpatient. If she cannot do this today during dialysis then we will need to consider palliative care consult. 4. Seizure disorder: Continue Keppra. No observed seizures noted.  5. Diabetes type 2: Continue ADA diet.  6. Hyperkalemia: This is secondary to end-stage renal disease.  7. Severe Aortic stenosis: No plans for interevention due to multiple comorbidities and advanced dementia.  8. Blanchable redness to bilateral heels POA, Cleanse bilateral heels with soap and water daily. Offload pressure to heels with Prevalon boot while in bed.      CODE STATUS: dnr  TOTAL TIME TAKING CARE OF THIS PATIENT: 20 minutes.     POSSIBLE D/C ??, DEPENDING ON CLINICAL CONDITION.   Helen Cuff M.D on 05/05/2015 at 11:58 AM  Between 7am to 6pm - Pager -  [phone] After 6pm go to [email or web address] - password [email or web address] [facility]  [place] [facility]  Office  [phone]  CC: Primary care physician; Amy L Krebs, NP  Note: This dictation was prepared with Dragon dictation along with smaller phrase technology. Any transcriptional errors that result from this process are unintentional.

## 2015-05-05 NOTE — Progress Notes (Signed)
Post hd tx 

## 2015-05-05 NOTE — Progress Notes (Signed)
Hemodialysis start 

## 2015-05-05 NOTE — Progress Notes (Signed)
[facility] - [place] at [facility]   PATIENT NAME: [name]    MR#:  [id number]  DATE OF BIRTH:  1945-09-07  SUBJECTIVE:  Patient nonverbal  REVIEW OF SYSTEMS:    Review of Systems  Unable to perform ROS   Tolerating Diet:yes      DRUG ALLERGIES:   Allergies  Allergen Reactions  . Contrast Media [Iodinated Diagnostic Agents] Anaphylaxis    VITALS:  Blood pressure 106/72, pulse 75, temperature 98.4 F (36.9 C), temperature source Oral, resp. rate 19, height  (1.6 m), weight 67.178 kg (148 lb 1.6 oz), SpO2 100 %.  PHYSICAL EXAMINATION:   Physical Exam  Constitutional: She is well-developed, well-nourished, and in no distress. No distress.  HENT:  Head: Normocephalic.  Eyes: No scleral icterus.  Neck: No JVD present. No tracheal deviation present.  Cardiovascular: Normal rate, regular rhythm and normal heart sounds.  Exam reveals no gallop and no friction rub.   No murmur heard. Pulmonary/Chest: Effort normal and breath sounds normal. No respiratory distress. She has no wheezes. She has no rales. She exhibits no tenderness.  Abdominal: Soft. Bowel sounds are normal. She exhibits no distension and no mass. There is no tenderness. There is no rebound and no guarding.  Musculoskeletal: Normal range of motion. She exhibits no edema.  Neurological: She is alert.  Skin: Skin is warm. No rash noted. No erythema.      LABORATORY PANEL:   CBC  Recent Labs Lab 05/03/15 1157  WBC 4.0  HGB 10.9*  HCT 33.4*  PLT 173   ------------------------------------------------------------------------------------------------------------------  Chemistries   Recent Labs Lab 05/02/15 1933    NA 138  < >   K 6.3*  < >   CL 92*  < >   CO2 26  < >   GLUCOSE 209*  < >   BUN 59*  < >   CREATININE 5.74*  < >   CALCIUM 9.8  < >   AST 177*  --    ALT 150*  --    ALKPHOS 227*  --   --   BILITOT 1.2  --   --   < > = values in this  interval not displayed. ------------------------------------------------------------------------------------------------------------------  Cardiac Enzymes  Recent Labs Lab 05/02/15 1933 05/03/15 0747 05/03/15 1940  TROPONINI 1.50* 1.20* 1.05*   ------------------------------------------------------------------------------------------------------------------  RADIOLOGY:  No results found.   ASSESSMENT AND PLAN:   70 year old female with a history of stroke and diabetes who presents with altered mental status.   1. Acute encephalopathy: Patient presentedwith lethargy and somnolence. Etiology is due to advanced dementia. MRI did not show CVA and TSH was normal. She appears close to her baseline.   2. Elevated troponin: This is due to demand ischemia and poor renal clearance. Continue aspirin and Plavix and a low dose COREG was started by Cardiology.   3. End-stage renal disease on hemodialysis: Patient will continue dialysis Monday, Wednesday and Friday. 4. Seizure disorder: Continue Keppra. No observed seizures noted.  5. Diabetes type 2: Continue ADA diet.  6. Hyperkalemia: This is secondary to end-stage renal disease.  7. Severe Aortic stenosis: No plans for interevention due to multiple comorbidities and advanced dementia.  8. Blanchable redness to bilateral heels POA, Cleanse bilateral heels with soap and water daily. Offload pressure to heels with Prevalon boot while in bed.      Management plans discussed with the patient 's daghter and she is in agreement.  CODE STATUS: dnr  TOTAL TIME TAKING CARE OF THIS PATIENT: 25 minutes.     POSSIBLE D/C today, DEPENDING ON CLINICAL CONDITION.   Aylana Hirschfeld M.D on 05/04/2015 at 11:57 AM  Between 7am to 6pm - Pager - [phone] After 6pm go to [email or web address] - password [email or web address] [facility]  [place] [facility]  Office  [phone]  CC: Primary care physician; Amy L Krebs, NP  Note: This dictation was  prepared with Dragon dictation along with smaller phrase technology. Any transcriptional errors that result from this process are unintentional.

## 2015-05-05 NOTE — Progress Notes (Signed)
MD informed of pt's daughter's concern for High Protein, Low cal diet and for SSI. MD stated she added instructions for diet.

## 2015-05-05 NOTE — Progress Notes (Signed)
[facility]  ROUNDING NOTE   Subjective:   Seated in Chair. Seen and examined on hemodialysis treatment. Tolerating treatment well.   Objective:  Vital signs in last 24 hours:  Temp:  [97.3 F (36.3 C)-98.4 F (36.9 C)] 98.4 F (36.9 C) (02/15 0947) Pulse Rate:  [75-90] 75 (02/15 1000) Resp:  [15-20] 18 (02/15 1000) BP: (113-127)/(65-79) 113/69 mmHg (02/15 1000) SpO2:  [100 %] 100 % (02/15 1000) Weight:  [67.178 kg (148 lb 1.6 oz)] 67.178 kg (148 lb 1.6 oz) (02/15 0947)  Weight change: 2.041 kg (4 lb 8 oz) Filed Weights   05/04/15 0500 05/05/15 0441 05/05/15 0947  Weight: 65.137 kg (143 lb 9.6 oz) 67.178 kg (148 lb 1.6 oz) 67.178 kg (148 lb 1.6 oz)    Intake/Output: I/O last 3 completed shifts: In: 161.8 [I.V.:161.8] Out: 0    Intake/Output this shift:  Total I/O In: 100 [P.O.:100] Out: 0   Physical Exam: General: NAD, laying in bed.   Head: Normocephalic, atraumatic. Moist oral mucosal membranes  Eyes: Anicteric, PERRL  Neck: Supple, trachea midline  Lungs:  Clear to auscultation  Heart: S1S2 no rubs  Abdomen:  Soft, nontender, BS present   Extremities: No peripheral edema.  Neurologic: Awake, not following commands  Skin: No lesions  Access: LUE AVG    Basic Metabolic Panel:  Recent Labs Lab 04/29/15 2001 04/30/15 1302 05/02/15 1040 05/02/15 1933 05/03/15 1157 05/04/15 0118  NA  --  137 136 138 136 137  K  --  4.5 6.5* 6.3* 6.3* 5.3*  CL  --  94* 97* 92* 96* 98*  CO2  --  32 23 26 22 27   GLUCOSE  --  145* 125* 209* 133* 109*  BUN  --  56* 54* 59* 66* 49*  CREATININE  --  5.64* 5.21* 5.74* 6.62* 5.03*  CALCIUM  --  9.5 9.2 9.8 8.9 8.3*  PHOS 3.0 2.7  --   --  4.4  --     Liver Function Tests:  Recent Labs Lab 04/28/15 1209 04/30/15 1302 05/02/15 1933 05/03/15 1157  AST 28  --  177*  --   ALT 23  --  150*  --   ALKPHOS 110  --  227*  --   BILITOT 0.8  --  1.2  --   PROT 7.4  --  7.6  --   ALBUMIN 3.6 2.8* 3.6 3.0*     Recent Labs Lab 05/02/15 1933  LIPASE 20    Recent Labs Lab 05/02/15 1952  AMMONIA 24    CBC:  Recent Labs Lab 04/28/15 1209 04/29/15 0529 04/30/15 1302 05/02/15 1933 05/03/15 1157  WBC 6.3 4.5 4.0 4.2 4.0  NEUTROABS 5.3  --   --  3.0  --   HGB 12.4 11.9* 9.6* 12.5 10.9*  HCT 38.8 37.3 29.5* 38.6 33.4*  MCV 96.7 96.6 95.9 97.6 95.1  PLT 259 237 198 173 173    Cardiac Enzymes:  Recent Labs Lab 04/30/15 0032 04/30/15 0500 05/02/15 1933 05/03/15 0747 05/03/15 1940  TROPONINI 0.37* 0.49* 1.50* 1.20* 1.05*    BNP: Invalid input(s): POCBNP  CBG:  Recent Labs Lab 05/04/15 1131 05/04/15 1658 05/04/15 2141 05/04/15 2230 05/05/15 0738  GLUCAP 112* 232* 177* 180* 174*    Microbiology: Results for orders placed or performed during the hospital encounter of 04/13/15  Culture, blood (routine x 2)     Status: None   Collection Time: 04/13/15  2:26 AM  Result Value Ref Range  Status   Specimen Description BLOOD BLOOD RIGHT FOREARM  Final   Special Requests BOTTLES DRAWN AEROBIC AND ANAEROBIC  Final   Culture NO GROWTH 5 DAYS  Final   Report Status 04/18/2015 FINAL  Final  Culture, blood (routine x 2)     Status: None   Collection Time: 04/13/15  2:50 AM  Result Value Ref Range Status   Specimen Description BLOOD RIGHT HAND  Final   Special Requests BOTTLES DRAWN AEROBIC AND ANAEROBIC  Final   Culture NO GROWTH 5 DAYS  Final   Report Status 04/18/2015 FINAL  Final  MRSA PCR Screening     Status: None   Collection Time: 04/13/15  6:18 AM  Result Value Ref Range Status   MRSA by PCR NEGATIVE NEGATIVE Final    Comment:        The GeneXpert MRSA Assay (FDA approved for NASAL specimens only), is one component of a comprehensive MRSA colonization surveillance program. It is not intended to diagnose MRSA infection nor to guide or monitor treatment for MRSA infections.     Coagulation Studies:  Recent Labs  05/03/15 0018  LABPROT  18.2*  INR 1.50    Urinalysis:  Recent Labs  05/02/15 2257  COLORURINE AMBER*  LABSPEC 1.020  PHURINE 5.0  GLUCOSEU 50*  HGBUR 1+*  BILIRUBINUR NEGATIVE  KETONESUR TRACE*  PROTEINUR >500*  NITRITE NEGATIVE  LEUKOCYTESUR 2+*      Imaging: Mr Brain Wo Contrast  05/03/2015  CLINICAL DATA:  Acute presentation with altered mental status yesterday. Somnolent. Unresponsive. EXAM: MRI HEAD WITHOUT CONTRAST TECHNIQUE: Multiplanar, multiecho pulse sequences of the brain and surrounding structures were obtained without intravenous contrast. COMPARISON:  CT 05/02/2015.  MRI 03/25/2012. FINDINGS: Diffusion imaging does not show any acute or subacute infarction. There are old small vessel ischemic changes affecting the pons. Few old small vessel cerebellar infarctions. Within the cerebral hemispheres, there are old cortical infarctions in the left frontal, occipital and parietal region. There chronic small-vessel ischemic changes affecting the thalami, basal ganglia and hemispheric white matter. The ventricles are prominent, consistent with central atrophy. No suspicion of obstructive hydrocephalus. No neoplastic mass lesion, hemorrhage or extra-axial collection. No pituitary mass. No inflammatory sinus disease. No skull or skullbase lesion. Major vessels at the base of the brain show flow. IMPRESSION: No acute or reversible process. Atrophy and chronic small vessel ischemic changes throughout the brain. Old cortical infarctions in the left occipital, parietal and posterior frontal regions. Electronically Signed   By: Paulina Fusi M.D.   On: 05/03/2015 11:42     Medications:     . acidophilus  1 capsule Oral Daily  . albuterol  2.5 mg Nebulization Once  . aspirin EC  81 mg Oral Daily  . atorvastatin  80 mg Oral Daily  . calcium acetate  1,334 mg Oral TID WC  . cholecalciferol  2,000 Units Oral Daily  . clopidogrel  75 mg Oral Daily  . docusate sodium  100 mg Oral QHS  . famotidine  20 mg  Oral QHS  . feeding supplement (NEPRO CARB STEADY)  237 mL Oral BID BM  . insulin aspart  0-5 Units Subcutaneous QHS  . insulin aspart  0-9 Units Subcutaneous TID WC  . iron polysaccharides  150 mg Oral Daily  . levETIRAcetam  500 mg Oral Q M,W,F-2000  . levETIRAcetam  750 mg Oral Q24H   acetaminophen **OR** acetaminophen, bisacodyl, lidocaine-prilocaine, morphine injection, ondansetron **OR** ondansetron (ZOFRAN) IV  Assessment/ Plan:  70 y.o. 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   [facility] Nephrology MWF [facility] [address]   1. End-stage renal disease on hemodialysis: Continue MWF schedule. Seen and examined on treatment. Seated in chair  2.  Anemia chronic kidney disease.  Hemoglobin 10.9 - Hold off on erythropoietin stimulating agents with concern of ischemic event.  - iron supplements.  3. Hypertension: blood pressure at goal.   4.  Secondary hyperparathyroidism: phos 4.4. PTH 250. Not currently on any vitamin D agents.  - Continue calcium acetate for binding   LOS:  [name] 2/15/201710:22 AM

## 2015-05-05 NOTE — Discharge Summary (Addendum)
[facility] -  at [facility]   PATIENT NAME: [name]    MR#:  [id number]  DATE OF BIRTH:  [birth year]  DATE OF ADMISSION:  05/02/2015 ADMITTING PHYSICIAN: Arnaldo Natal, MD  DATE OF DISCHARGE: 05/05/2015  PRIMARY CARE PHYSICIAN: Amy Diana Eves, NP    ADMISSION DIAGNOSIS:  Hyperkalemia [E87.5] NSTEMI (non-ST elevated myocardial infarction) (HCC) [I21.4] Altered mental status, unspecified altered mental status type [R41.82]  DISCHARGE DIAGNOSIS:  Active Problems:   Confusion   SECONDARY DIAGNOSIS:   Past Medical History  Diagnosis Date  . Stroke (HCC)   . MI (myocardial infarction) (HCC)   . Hearing impaired   . Hyperlipidemia   . Hypertension   . Diabetes mellitus without complication (HCC)   . Depression   . Coronary artery disease   . Dementia   . Secondary hyperparathyroidism (HCC)   . Chronic systolic CHF (congestive heart failure) (HCC)   . GERD (gastroesophageal reflux disease)   . Personal history of transient ischemic attack (TIA) and cerebral infarction without residual deficit   . Anemia   . Moderate aortic stenosis     a. echo 04/2014: 40-50%, moderate AS b. echo 01/2015: EF 35-40%, Severe AS, mild MR, moderate TR  . ESRD (end stage renal disease) (HCC)     a. Dialysis MWF    HOSPITAL COURSE:    70 year old female with a history of stroke and diabetes who presents with altered mental status.   1. Acute encephalopathy: Patient presented with lethargy and somnolence. Etiology is due to advanced dementia. MRI did not show CVA and TSH was normal. She appears close to her baseline.   2. Elevated troponin: This is due to demand ischemia and poor renal clearance. Continue aspirin and Plavix and a low dose COREG was started by Cardiology.   3. End-stage renal disease on hemodialysis: Patient will continue dialysis Monday, Wednesday and Friday.    4. Seizure disorder: Continue Keppra. No observed seizures  noted.  5. Diabetes type 2: Continue ADA diet.  6. Hyperkalemia: This is secondary to end-stage renal disease.  7. Severe Aortic stenosis: No plans for interevention due to multiple comorbidities and advanced dementia.   8.  Blanchable redness to bilateral heels POA, Cleanse bilateral heels with soap and water daily. Offload pressure to heels with Prevalon boot while in bed.   DISCHARGE CONDITIONS AND DIET:  DYSPHAGIA 1 diet - protein rich and low-calorie diet Please check fingersticks 4 times a day-before meals and at bedtime and cover with sliding scale insulin. Stable needs HOSPICE EVAL at SNF  CONSULTS OBTAINED:  Treatment Team:  Lamont Dowdy, MD  DRUG ALLERGIES:   Allergies  Allergen Reactions  . Contrast Media [Iodinated Diagnostic Agents] Anaphylaxis    DISCHARGE MEDICATIONS:   Current Discharge Medication List    START taking these medications   Details  carvedilol (COREG) 3.125 MG tablet Take 1 tablet (3.125 mg total) by mouth 2 (two) times daily with a meal. Qty: 60 tablet, Refills: 0      CONTINUE these medications which have NOT CHANGED   Details  aspirin EC 81 MG tablet Take 81 mg by mouth daily.    atorvastatin (LIPITOR) 80 MG tablet Take 1 tablet (80 mg total) by mouth daily. Qty: 30 tablet, Refills: 1    bisacodyl (DULCOLAX) 10 MG suppository Place 1 suppository (10 mg total) rectally daily as needed for moderate constipation. Qty: 12 suppository, Refills: 0    calcium acetate (PHOSLO) 667 MG capsule Take  1,334 mg by mouth 3 (three) times daily with meals.     cholecalciferol (VITAMIN D) 1000 units tablet Take 2,000 Units by mouth daily.    clopidogrel (PLAVIX) 75 MG tablet Take 1 tablet (75 mg total) by mouth daily. Qty: 30 tablet, Refills: 3    Cranberry 450 MG TABS Take 450 mg by mouth 2 (two) times daily.    docusate sodium (COLACE) 100 MG capsule Take 100 mg by mouth at bedtime.     insulin glargine (LANTUS) 100 UNIT/ML injection  Inject 5 Units into the skin daily.    insulin lispro (HUMALOG) 100 UNIT/ML injection Inject 0-9 Units into the skin 4 (four) times daily -  before meals and at bedtime. Pt uses as needed per sliding scale:    0-99:  0 units  100-149:  2 units  150-199:  3 units  200-249:  4 units  250-299:  5 units  300-349:  6 units 350-399:  7 units 400-449:  8 units 450-499:  9 units  500 or greater:  Call MD    iron polysaccharides (NIFEREX) 150 MG capsule Take 150 mg by mouth daily.    lactobacillus acidophilus (BACID) TABS tablet Take 1 tablet by mouth daily.     !! levETIRAcetam (KEPPRA) 500 MG tablet Take 1 tablet (500 mg total) by mouth every Monday, Wednesday, and Friday at 8 PM. Qty: 30 tablet, Refills: 1    !! levETIRAcetam (KEPPRA) 750 MG tablet Take 1 tablet (750 mg total) by mouth daily. Qty: 30 tablet, Refills: 1    lidocaine-prilocaine (EMLA) cream Apply 1 application topically as needed (prior to dialysis on Monday, Wednesday, and Friday).    Nutritional Supplements (FEEDING SUPPLEMENT, NEPRO CARB STEADY,) LIQD Take 237 mLs by mouth 2 (two) times daily between meals. Qty: 60 Can, Refills: 0    ranitidine (ZANTAC) 150 MG tablet Take 1 tablet (150 mg total) by mouth at bedtime. Qty: 90 tablet, Refills: 3     !! - Potential duplicate medications found. Please discuss with provider.            Today   CHIEF COMPLAINT:   nonverbal   VITAL SIGNS:  Blood pressure 106/72, pulse 75, temperature 98.4 F (36.9 C), temperature source Oral, resp. rate 19, height 5\' 3"  (1.6 m), weight 67.178 kg (148 lb 1.6 oz), SpO2 100 %.   REVIEW OF SYSTEMS:  Review of Systems  Unable to perform ROS    PHYSICAL EXAMINATION:  GENERAL:  70 y.o.-year-old patient lying in the bed with no acute distress.  NECK:  Supple, no jugular venous distention. No thyroid enlargement, no tenderness.  LUNGS: Normal breath sounds bilaterally, no wheezing, rales,rhonchi  No use of accessory muscles  of respiration.  CARDIOVASCULAR: S1, S2 normal.3/5 SEM no rubs, or gallops.  ABDOMEN: Soft, non-tender, non-distended. Bowel sounds present. No organomegaly or mass.  EXTREMITIES: No pedal edema, cyanosis, or clubbing.  PSYCHIATRIC: The patient is alert   Does not follow commands  DATA REVIEW:   CBC  Recent Labs Lab 05/03/15 1157  WBC 4.0  HGB 10.9*  HCT 33.4*  PLT 173    Chemistries   Recent Labs Lab 05/02/15 1933  05/05/15 1042  NA 138  < > 140  K 6.3*  < > 4.8  CL 92*  < > 97*  CO2 26  < > 29  GLUCOSE 209*  < > 209*  BUN 59*  < > 62*  CREATININE 5.74*  < > 6.58*  CALCIUM 9.8  < >  8.4*  AST 177*  --   --   ALT 150*  --   --   ALKPHOS 227*  --   --   BILITOT 1.2  --   --   < > = values in this interval not displayed.  Cardiac Enzymes  Recent Labs Lab 05/02/15 1933 05/03/15 0747 05/03/15 1940  TROPONINI 1.50* 1.20* 1.05*    Microbiology Results  @  RADIOLOGY:  No results found.    Management plans discussed with the patient's daughter and she is in agreement. Stable for discharge SNF  Patient should follow up with PCP in 1 week  CODE STATUS:     Code Status Orders        Start     Ordered   05/03/15 0226  Do not attempt resuscitation (DNR)   Continuous    Question Answer Comment  In the event of cardiac or respiratory ARREST Do not call a "code blue"   In the event of cardiac or respiratory ARREST Do not perform Intubation, CPR, defibrillation or ACLS   In the event of cardiac or respiratory ARREST Use medication by any route, position, wound care, and other measures to relive pain and suffering. May use oxygen, suction and manual treatment of airway obstruction as needed for comfort.      05/03/15 0225    Code Status History    Date Active Date Inactive Code Status Order ID Comments User Context   04/28/2015  3:13 PM 04/30/2015  9:07 PM DNR [id number]  Wyatt Haste, MD ED   04/13/2015  6:18 AM 04/21/2015  7:12 PM Full Code  [id number]  Oralia Manis, MD Inpatient   02/04/2015  5:09 PM 02/23/2015  9:12 PM Full Code [id number]  Altamese Dilling, MD Inpatient   02/04/2015 12:22 PM 02/04/2015  5:09 PM Full Code [id number]  Annice Needy, MD Inpatient   12/02/2014  5:37 PM 12/05/2014  6:47 PM Full Code [id number]  Houston Siren, MD Inpatient    Advance Directive Documentation        Most Recent Value   Type of Advance Directive  Out of facility DNR (pink MOST or yellow form)   Pre-existing out of facility DNR order (yellow form or pink MOST form)  Yellow form placed in chart (order not valid for inpatient use)   "MOST" Form in Place?        TOTAL TIME TAKING CARE OF THIS PATIENT: 35 minutes.    Note: This dictation was prepared with Dragon dictation along with smaller phrase technology. Any transcriptional errors that result from this process are unintentional.  MODY, SITAL M.D on 05/05/2015 at 12:00 PM  Between 7am to 6pm - Pager - [phone] After 6pm go to [email or web address] - password [email or web address] [facility]  [facility]  Office  [phone]  CC: Primary care physician; Amy Diana Eves, NP

## 2015-05-05 NOTE — Care Management Important Message (Signed)
Important Message  Patient Details  Name: [name] MRN: [id number] Date of Birth: 1945/11/26   Medicare Important Message Given:  Yes    [name] A Mackenzey Crownover 05/05/2015, 9:20 AM

## 2015-05-05 NOTE — Clinical Social Work Note (Signed)
Patient is able to sit up for dialysis and thus can discharge today and physician is going to discharge to return to [facility] today. CSW spoke with patient's daughter and guardian, Randye Lobo, this afternoon and she is aware and in agreement with discharge. CSW spoke with Ms. Love at length and offered support and encouragement for her current situation. Patient's daughter verbalized that it is very difficult to make end of life decisions for her mother when her mother continues to bounce back and appears to fight. CSW offered contact information if she needed to talk further. Discharge information sent to [facility] and nurse to call report. Patient to transport via EMS.  York Spaniel MSW,[name] [phone]

## 2015-05-05 NOTE — Progress Notes (Signed)
Initial Nutrition Assessment     INTERVENTION:  Meals and snacks: Cater to pt preferences [employer] Supplement Therapy: agree with nepro BID for added nutrition   NUTRITION DIAGNOSIS:   Increased nutrient needs related to chronic illness, wound healing as evidenced by estimated needs.    GOAL:   Patient will meet greater than or equal to 90% of their needs    MONITOR:    (Energy intake, Electrolyte and renal profile)  REASON FOR ASSESSMENT:    (dialysis pt)    ASSESSMENT:      Pt admitted with lethargy, acute encephalopathy  Past Medical History  Diagnosis Date  . Stroke (HCC)   . MI (myocardial infarction) (HCC)   . Hearing impaired   . Hyperlipidemia   . Hypertension   . Diabetes mellitus without complication (HCC)   . Depression   . Coronary artery disease   . Dementia   . Secondary hyperparathyroidism (HCC)   . Chronic systolic CHF (congestive heart failure) (HCC)   . GERD (gastroesophageal reflux disease)   . Personal history of transient ischemic attack (TIA) and cerebral infarction without residual deficit   . Anemia   . Moderate aortic stenosis     a. echo 04/2014: 40-50%, moderate AS b. echo 01/2015: EF 35-40%, Severe AS, mild MR, moderate TR  . ESRD (end stage renal disease) (HCC)     a. Dialysis MWF    Current Nutrition: noted pt eating 25% of meals (diet started yesterday).  Pt not in room during rounds this am. No family present  Food/Nutrition-Related History: unsure, MST within desired limits   Scheduled Medications:  . acidophilus  1 capsule Oral Daily  . albuterol  2.5 mg Nebulization Once  . aspirin EC  81 mg Oral Daily  . atorvastatin  80 mg Oral Daily  . calcium acetate  1,334 mg Oral TID WC  . cholecalciferol  2,000 Units Oral Daily  . clopidogrel  75 mg Oral Daily  . docusate sodium  100 mg Oral QHS  . famotidine  20 mg Oral QHS  . feeding supplement (NEPRO CARB STEADY)  237 mL Oral BID BM  . insulin aspart  0-5  Units Subcutaneous QHS  . insulin aspart  0-9 Units Subcutaneous TID WC  . iron polysaccharides  150 mg Oral Daily  . levETIRAcetam  500 mg Oral Q M,W,F-2000  . levETIRAcetam  750 mg Oral Q24H        Electrolyte/Renal Profile and Glucose Profile:   Recent Labs Lab 04/30/15 1302  05/03/15 1157 05/04/15 0118 05/05/15 1042  NA 137  < > 136 137 140  K 4.5  < > 6.3* 5.3* 4.8  CL 94*  < > 96* 98* 97*  CO2 32  < > BUN 56*  < > 66* 49* 62*  CREATININE 5.64*  < > 6.62* 5.03* 6.58*  CALCIUM 9.5  < > 8.9 8.3* 8.4*  PHOS 2.7  --  4.4  --  4.6  GLUCOSE 145*  < > 133* 109* 209*  < > = values in this interval not displayed. Protein Profile:  Recent Labs Lab 05/02/15 1933 05/03/15 1157 05/05/15 1042  ALBUMIN 3.6 3.0* 2.9*    Gastrointestinal Profile: Last BM: 2/13   Nutrition-Focused Physical Exam Findings: unable to perform as pt out of room   Weight Change: wt encounters reviewed, noted wt gain (?? Fluid)    Diet Order:  DIET - DYS 1 Room service appropriate?: Yes; Fluid consistency:: Thin  Diet - low sodium heart healthy  Skin:   bilateral unstageable pressure ulcers  Last BM:   2/13  Height:   Ht Readings from Last 1 Encounters:  05/03/15  (1.6 m)    Weight:   Wt Readings from Last 1 Encounters:  05/05/15 148 lb 1.6 oz (67.178 kg)    Ideal Body Weight:     BMI:  Body mass index is 26.24 kg/(m^2).  Estimated Nutritional Needs:   Kcal:  BEE 1100 kcals (IF 1.0-1.2 AF 1.2) 1320-1716 kcals/d.   Protein:  (1.2-1.5 g/kg) 71-89 g/d  Fluid:  ( + UOP)  EDUCATION NEEDS:   No education needs identified at this time  MODERATE Care Level  Braileigh Landenberger B. Freida Busman, RD, LDN [phone] (pager) Weekend/On-Call pager [phone])

## 2015-05-05 NOTE — Progress Notes (Signed)
Pre-hd tx 

## 2015-05-05 NOTE — Discharge Instructions (Signed)
1. Please check finger sticks 4 times/day- three time prior to meals and also at bedtime and follow the sliding scale coverage 2. Please place the patient on a High protein low calorie diet.

## 2015-05-05 NOTE — Progress Notes (Signed)
Report given to [facility], EMS to be called. Pt is ready for D/C. IV out and Tele removed.

## 2015-05-06 LAB — PATHOLOGIST SMEAR REVIEW

## 2015-05-07 ENCOUNTER — Inpatient Hospital Stay: Payer: Self-pay | Admitting: Family Medicine

## 2015-05-11 ENCOUNTER — Inpatient Hospital Stay: Payer: Self-pay | Admitting: Family Medicine

## 2015-05-22 ENCOUNTER — Emergency Department: Payer: Medicare Other

## 2015-05-22 ENCOUNTER — Encounter: Payer: Self-pay | Admitting: Emergency Medicine

## 2015-05-22 ENCOUNTER — Inpatient Hospital Stay
Admission: EM | Admit: 2015-05-22 | Discharge: 2015-05-29 | DRG: 189 | Disposition: A | Discharge status: Hospice | Payer: Medicare Other | Attending: Internal Medicine | Admitting: Internal Medicine

## 2015-05-22 DIAGNOSIS — Z993 Dependence on wheelchair: Secondary | ICD-10-CM | POA: Diagnosis not present

## 2015-05-22 DIAGNOSIS — N2581 Secondary hyperparathyroidism of renal origin: Secondary | ICD-10-CM | POA: Diagnosis present

## 2015-05-22 DIAGNOSIS — H9190 Unspecified hearing loss, unspecified ear: Secondary | ICD-10-CM | POA: Diagnosis present

## 2015-05-22 DIAGNOSIS — I350 Nonrheumatic aortic (valve) stenosis: Secondary | ICD-10-CM | POA: Diagnosis present

## 2015-05-22 DIAGNOSIS — Z66 Do not resuscitate: Secondary | ICD-10-CM | POA: Diagnosis present

## 2015-05-22 DIAGNOSIS — I5022 Chronic systolic (congestive) heart failure: Secondary | ICD-10-CM | POA: Diagnosis present

## 2015-05-22 DIAGNOSIS — Z79899 Other long term (current) drug therapy: Secondary | ICD-10-CM

## 2015-05-22 DIAGNOSIS — J810 Acute pulmonary edema: Secondary | ICD-10-CM

## 2015-05-22 DIAGNOSIS — E1165 Type 2 diabetes mellitus with hyperglycemia: Secondary | ICD-10-CM | POA: Diagnosis present

## 2015-05-22 DIAGNOSIS — Z8673 Personal history of transient ischemic attack (TIA), and cerebral infarction without residual deficits: Secondary | ICD-10-CM | POA: Diagnosis not present

## 2015-05-22 DIAGNOSIS — I132 Hypertensive heart and chronic kidney disease with heart failure and with stage 5 chronic kidney disease, or end stage renal disease: Secondary | ICD-10-CM | POA: Diagnosis present

## 2015-05-22 DIAGNOSIS — Z515 Encounter for palliative care: Secondary | ICD-10-CM | POA: Diagnosis not present

## 2015-05-22 DIAGNOSIS — F0390 Unspecified dementia without behavioral disturbance: Secondary | ICD-10-CM | POA: Diagnosis present

## 2015-05-22 DIAGNOSIS — Z7982 Long term (current) use of aspirin: Secondary | ICD-10-CM

## 2015-05-22 DIAGNOSIS — I472 Ventricular tachycardia: Secondary | ICD-10-CM | POA: Diagnosis not present

## 2015-05-22 DIAGNOSIS — D649 Anemia, unspecified: Secondary | ICD-10-CM | POA: Diagnosis present

## 2015-05-22 DIAGNOSIS — Z794 Long term (current) use of insulin: Secondary | ICD-10-CM

## 2015-05-22 DIAGNOSIS — E1122 Type 2 diabetes mellitus with diabetic chronic kidney disease: Secondary | ICD-10-CM | POA: Diagnosis present

## 2015-05-22 DIAGNOSIS — I2510 Atherosclerotic heart disease of native coronary artery without angina pectoris: Secondary | ICD-10-CM | POA: Diagnosis present

## 2015-05-22 DIAGNOSIS — I252 Old myocardial infarction: Secondary | ICD-10-CM

## 2015-05-22 DIAGNOSIS — J811 Chronic pulmonary edema: Secondary | ICD-10-CM

## 2015-05-22 DIAGNOSIS — E875 Hyperkalemia: Secondary | ICD-10-CM | POA: Diagnosis present

## 2015-05-22 DIAGNOSIS — Z8249 Family history of ischemic heart disease and other diseases of the circulatory system: Secondary | ICD-10-CM

## 2015-05-22 DIAGNOSIS — G40909 Epilepsy, unspecified, not intractable, without status epilepticus: Secondary | ICD-10-CM | POA: Diagnosis present

## 2015-05-22 DIAGNOSIS — N186 End stage renal disease: Secondary | ICD-10-CM | POA: Diagnosis present

## 2015-05-22 DIAGNOSIS — Z992 Dependence on renal dialysis: Secondary | ICD-10-CM | POA: Diagnosis not present

## 2015-05-22 DIAGNOSIS — R0902 Hypoxemia: Secondary | ICD-10-CM

## 2015-05-22 DIAGNOSIS — K219 Gastro-esophageal reflux disease without esophagitis: Secondary | ICD-10-CM | POA: Diagnosis present

## 2015-05-22 DIAGNOSIS — E785 Hyperlipidemia, unspecified: Secondary | ICD-10-CM | POA: Diagnosis present

## 2015-05-22 DIAGNOSIS — R569 Unspecified convulsions: Secondary | ICD-10-CM

## 2015-05-22 LAB — CBC WITH DIFFERENTIAL/PLATELET
BASOS PCT: 3 %
Basophils Absolute: 0.2 10*3/uL — ABNORMAL HIGH (ref 0–0.1)
EOS ABS: 0 10*3/uL (ref 0–0.7)
EOS PCT: 0 %
HEMATOCRIT: 39.1 % (ref 35.0–47.0)
Hemoglobin: 12.7 g/dL (ref 12.0–16.0)
Lymphocytes Relative: 8 %
Lymphs Abs: 0.5 10*3/uL — ABNORMAL LOW (ref 1.0–3.6)
MCH: 31 pg (ref 26.0–34.0)
MCHC: 32.6 g/dL (ref 32.0–36.0)
MCV: 95.1 fL (ref 80.0–100.0)
MONO ABS: 0.7 10*3/uL (ref 0.2–0.9)
MONOS PCT: 10 %
Neutro Abs: 5.2 10*3/uL (ref 1.4–6.5)
Neutrophils Relative %: 79 %
PLATELETS: 148 10*3/uL — AB (ref 150–440)
RBC: 4.11 MIL/uL (ref 3.80–5.20)
RDW: 21.7 % — AB (ref 11.5–14.5)
WBC: 6.5 10*3/uL (ref 3.6–11.0)

## 2015-05-22 LAB — BASIC METABOLIC PANEL
Anion gap: 18 — ABNORMAL HIGH (ref 5–15)
BUN: 43 mg/dL — ABNORMAL HIGH (ref 6–20)
CALCIUM: 9.5 mg/dL (ref 8.9–10.3)
CO2: 22 mmol/L (ref 22–32)
CREATININE: 4.27 mg/dL — AB (ref 0.44–1.00)
Chloride: 91 mmol/L — ABNORMAL LOW (ref 101–111)
GFR calc non Af Amer: 10 mL/min — ABNORMAL LOW (ref >60)
GFR, EST AFRICAN AMERICAN: 11 mL/min — AB (ref >60)
Glucose, Bld: 110 mg/dL — ABNORMAL HIGH (ref 65–99)
Potassium: 6.4 mmol/L — ABNORMAL HIGH (ref 3.5–5.1)
SODIUM: 131 mmol/L — AB (ref 135–145)

## 2015-05-22 LAB — MRSA PCR SCREENING: MRSA by PCR: NEGATIVE

## 2015-05-22 LAB — TROPONIN I: Troponin I: 0.15 ng/mL — ABNORMAL HIGH (ref <0.031)

## 2015-05-22 MED ORDER — CALCIUM GLUCONATE 10 % IV SOLN
1.0000 g | Freq: Once | INTRAVENOUS | Status: AC
  Administered 2015-05-22: 1 g via INTRAVENOUS
  Filled 2015-05-22: qty 10

## 2015-05-22 MED ORDER — CLOPIDOGREL BISULFATE 75 MG PO TABS
75.0000 mg | ORAL_TABLET | Freq: Every day | ORAL | Status: DC
  Administered 2015-05-23 – 2015-05-29 (×6): 75 mg via ORAL
  Filled 2015-05-22 (×6): qty 1

## 2015-05-22 MED ORDER — SODIUM CHLORIDE 0.9% FLUSH
3.0000 mL | Freq: Two times a day (BID) | INTRAVENOUS | Status: DC
  Administered 2015-05-22 – 2015-05-29 (×12): 3 mL via INTRAVENOUS

## 2015-05-22 MED ORDER — INSULIN GLARGINE 100 UNIT/ML ~~LOC~~ SOLN
5.0000 [IU] | Freq: Every day | SUBCUTANEOUS | Status: DC
  Filled 2015-05-22 (×2): qty 0.05

## 2015-05-22 MED ORDER — SODIUM POLYSTYRENE SULFONATE 15 GM/60ML PO SUSP
30.0000 g | Freq: Once | ORAL | Status: DC
  Filled 2015-05-22: qty 120

## 2015-05-22 MED ORDER — HEPARIN SODIUM (PORCINE) 5000 UNIT/ML IJ SOLN
5000.0000 [IU] | Freq: Three times a day (TID) | INTRAMUSCULAR | Status: DC
  Administered 2015-05-23 – 2015-05-29 (×18): 5000 [IU] via SUBCUTANEOUS
  Filled 2015-05-22 (×18): qty 1

## 2015-05-22 MED ORDER — ASPIRIN EC 81 MG PO TBEC
81.0000 mg | DELAYED_RELEASE_TABLET | Freq: Every day | ORAL | Status: DC
  Administered 2015-05-23 – 2015-05-29 (×6): 81 mg via ORAL
  Filled 2015-05-22 (×6): qty 1

## 2015-05-22 MED ORDER — CALCIUM ACETATE (PHOS BINDER) 667 MG PO CAPS
1334.0000 mg | ORAL_CAPSULE | Freq: Three times a day (TID) | ORAL | Status: DC
  Administered 2015-05-23 – 2015-05-29 (×15): 1334 mg via ORAL
  Filled 2015-05-22 (×28): qty 2

## 2015-05-22 MED ORDER — FAMOTIDINE 20 MG PO TABS
20.0000 mg | ORAL_TABLET | Freq: Every day | ORAL | Status: DC
  Administered 2015-05-23 – 2015-05-29 (×6): 20 mg via ORAL
  Filled 2015-05-22 (×6): qty 1

## 2015-05-22 MED ORDER — DOCUSATE SODIUM 100 MG PO CAPS
100.0000 mg | ORAL_CAPSULE | Freq: Every day | ORAL | Status: DC
  Administered 2015-05-24 – 2015-05-28 (×4): 100 mg via ORAL
  Filled 2015-05-22 (×5): qty 1

## 2015-05-22 MED ORDER — LIDOCAINE-PRILOCAINE 2.5-2.5 % EX CREA
1.0000 "application " | TOPICAL_CREAM | CUTANEOUS | Status: DC | PRN
  Filled 2015-05-22: qty 5

## 2015-05-22 MED ORDER — DEXTROSE 50 % IV SOLN
25.0000 g | Freq: Once | INTRAVENOUS | Status: AC
  Administered 2015-05-22: 25 g via INTRAVENOUS
  Filled 2015-05-22: qty 50

## 2015-05-22 MED ORDER — POLYSACCHARIDE IRON COMPLEX 150 MG PO CAPS
150.0000 mg | ORAL_CAPSULE | Freq: Every day | ORAL | Status: DC
  Administered 2015-05-23 – 2015-05-29 (×6): 150 mg via ORAL
  Filled 2015-05-22 (×8): qty 1

## 2015-05-22 MED ORDER — CARVEDILOL 3.125 MG PO TABS: 3.1250 mg | ORAL_TABLET | Freq: Two times a day (BID) | ORAL | Status: DC

## 2015-05-22 MED ORDER — LEVETIRACETAM 750 MG PO TABS
750.0000 mg | ORAL_TABLET | ORAL | Status: DC
  Administered 2015-05-23 – 2015-05-26 (×3): 750 mg via ORAL
  Filled 2015-05-22 (×2): qty 1

## 2015-05-22 MED ORDER — ATORVASTATIN CALCIUM 20 MG PO TABS
80.0000 mg | ORAL_TABLET | Freq: Every day | ORAL | Status: DC
  Administered 2015-05-23 – 2015-05-29 (×6): 80 mg via ORAL
  Filled 2015-05-22 (×6): qty 4

## 2015-05-22 MED ORDER — INSULIN ASPART 100 UNIT/ML ~~LOC~~ SOLN
0.0000 [IU] | Freq: Three times a day (TID) | SUBCUTANEOUS | Status: DC
  Administered 2015-05-23: 3 [IU] via SUBCUTANEOUS
  Filled 2015-05-22: qty 3

## 2015-05-22 MED ORDER — NEPRO/CARBSTEADY PO LIQD
237.0000 mL | Freq: Two times a day (BID) | ORAL | Status: DC
  Administered 2015-05-23 – 2015-05-29 (×6): 237 mL via ORAL

## 2015-05-22 MED ORDER — INSULIN GLARGINE 100 UNIT/ML ~~LOC~~ SOLN
5.0000 [IU] | Freq: Every day | SUBCUTANEOUS | Status: DC
  Administered 2015-05-23 – 2015-05-25 (×3): 5 [IU] via SUBCUTANEOUS
  Filled 2015-05-22 (×4): qty 0.05

## 2015-05-22 MED ORDER — VITAMIN D 1000 UNITS PO TABS
2000.0000 [IU] | ORAL_TABLET | Freq: Every day | ORAL | Status: DC
  Administered 2015-05-23 – 2015-05-29 (×6): 2000 [IU] via ORAL
  Filled 2015-05-22 (×7): qty 2

## 2015-05-22 MED ORDER — INSULIN ASPART 100 UNIT/ML ~~LOC~~ SOLN
6.0000 [IU] | Freq: Once | SUBCUTANEOUS | Status: AC
  Administered 2015-05-22: 6 [IU] via SUBCUTANEOUS
  Filled 2015-05-22: qty 6

## 2015-05-22 MED ORDER — CRANBERRY 450 MG PO TABS: 450.0000 mg | ORAL_TABLET | Freq: Two times a day (BID) | ORAL | Status: DC

## 2015-05-22 MED ORDER — BACID PO TABS
1.0000 | ORAL_TABLET | Freq: Every day | ORAL | Status: DC
  Administered 2015-05-24 – 2015-05-25 (×2): 1 via ORAL
  Filled 2015-05-22 (×8): qty 1

## 2015-05-22 MED ORDER — BISACODYL 10 MG RE SUPP: 10.0000 mg | Freq: Every day | RECTAL | Status: DC | PRN

## 2015-05-22 MED ORDER — LEVETIRACETAM 500 MG PO TABS
500.0000 mg | ORAL_TABLET | ORAL | Status: DC
  Administered 2015-05-24 – 2015-05-28 (×3): 500 mg via ORAL
  Filled 2015-05-22 (×4): qty 1

## 2015-05-22 NOTE — ED Notes (Signed)
MD at bedside. 

## 2015-05-22 NOTE — ED Notes (Signed)
Lab sending someone to attempt to draw labs

## 2015-05-22 NOTE — Progress Notes (Signed)
Consent needed for HD. Pt HPOA, [name], called and telephone consent obtained. [name] was second witness. Consent signed and in chart. Pt taken down for HD. Will continue to monitor.  [name], [name]

## 2015-05-22 NOTE — ED Notes (Signed)
[facility] EMS was called out for "focal seziure" On arrival to ED patient currently has O2 sat of 79% on non-rebreather mask. Patient is non verbal at baseline

## 2015-05-22 NOTE — ED Notes (Signed)
Pt taken off non-breather. Maintaining saturations 100% at this time

## 2015-05-22 NOTE — H&P (Signed)
[facility] - [place] at [facility]   PATIENT NAME: [name]    MR#:  [id number]  DATE OF BIRTH:  06-18-1945  DATE OF ADMISSION:  05/22/2015  PRIMARY CARE PHYSICIAN: Filbert Berthold, NP   REQUESTING/REFERRING PHYSICIAN: Good man  CHIEF COMPLAINT:   Chief Complaint  Patient presents with  . Respiratory Distress    HISTORY OF PRESENT ILLNESS: [name]  is a 70 y.o. female with a known history of stroke, wheelchair-bound and currently in nursing home for rehabilitation, MI, hyperlipidemia, hypertension, diabetes, coronary artery disease, secondary hyperparathyroidism, chronic systolic CHF, end-stage renal disease on hemodialysis, chronic anemia- was noted to have seizures as per the nursing home nurses, and the called patient's guardian her daughter and called EMS for transfer the patient to emergency room. In ER workup patient was noted to have pulmonary edema on chest x-ray and her potassium level was more than 6 She had her regular dialysis done yesterday. Patient has baseline dementia and so she is not able to give me any history all this history is obtained from patient's daughter and guardian who is present in the room.  PAST MEDICAL HISTORY:   Past Medical History  Diagnosis Date  . Stroke (HCC)   . MI (myocardial infarction) (HCC)   . Hearing impaired   . Hyperlipidemia   . Hypertension   . Diabetes mellitus without complication (HCC)   . Depression   . Coronary artery disease   . Dementia   . Secondary hyperparathyroidism (HCC)   . Chronic systolic CHF (congestive heart failure) (HCC)   . GERD (gastroesophageal reflux disease)   . Personal history of transient ischemic attack (TIA) and cerebral infarction without residual deficit   . Anemia   . Moderate aortic stenosis     a. echo 04/2014: 40-50%, moderate AS b. echo 01/2015: EF 35-40%, Severe AS, mild MR, moderate TR  . ESRD (end stage renal disease) (HCC)     a. Dialysis MWF    PAST  SURGICAL HISTORY: Past Surgical History  Procedure Laterality Date  . Cataract extraction, bilateral    . Wrist surgery    . Peripheral vascular catheterization Left 07/28/2014    Procedure: A/V Shuntogram/Fistulagram;  Surgeon: Renford Dills, MD;  Location: [facility] INVASIVE CV LAB;  Service: Cardiovascular;  Laterality: Left;  . Peripheral vascular catheterization Left 07/28/2014    Procedure: A/V Shunt Intervention;  Surgeon: Renford Dills, MD;  Location: [facility] INVASIVE CV LAB;  Service: Cardiovascular;  Laterality: Left;  . Peripheral vascular catheterization Left 02/04/2015    Procedure: A/V Shuntogram/Fistulagram;  Surgeon: Annice Needy, MD;  Location: [facility] INVASIVE CV LAB;  Service: Cardiovascular;  Laterality: Left;  . Peripheral vascular catheterization Left 02/04/2015    Procedure: A/V Shunt Intervention;  Surgeon: Annice Needy, MD;  Location: [facility] INVASIVE CV LAB;  Service: Cardiovascular;  Laterality: Left;    SOCIAL HISTORY:  Social History  Substance Use Topics  . Smoking status: Never Smoker   . Smokeless tobacco: Not on file  . Alcohol Use: No    FAMILY HISTORY:  Family History  Problem Relation Age of Onset  . CAD Father     DRUG ALLERGIES:  Allergies  Allergen Reactions  . Contrast Media [Iodinated Diagnostic Agents] Anaphylaxis    REVIEW OF SYSTEMS:    Patient has dementia and she is not able to give me any review of system.  MEDICATIONS AT HOME:  Prior to Admission medications   Medication Sig Start Date  End Date Taking? Authorizing Provider  aspirin EC 81 MG tablet Take 81 mg by mouth daily.    Historical Provider, MD  atorvastatin (LIPITOR) 80 MG tablet Take 1 tablet (80 mg total) by mouth daily. 04/20/15   Shaune Pollack, MD  bisacodyl (DULCOLAX) 10 MG suppository Place 1 suppository (10 mg total) rectally daily as needed for moderate constipation. 02/23/15   Ramonita Lab, MD  calcium acetate (PHOSLO) 667 MG capsule Take 1,334 mg by mouth 3 (three) times  daily with meals.     Historical Provider, MD  carvedilol (COREG) 3.125 MG tablet Take 1 tablet (3.125 mg total) by mouth 2 (two) times daily with a meal. 05/04/15   Adrian Saran, MD  cholecalciferol (VITAMIN D) 1000 units tablet Take 2,000 Units by mouth daily.    Historical Provider, MD  clopidogrel (PLAVIX) 75 MG tablet Take 1 tablet (75 mg total) by mouth daily. 09/07/14   Antonieta Iba, MD  Cranberry 450 MG TABS Take 450 mg by mouth 2 (two) times daily.    Historical Provider, MD  docusate sodium (COLACE) 100 MG capsule Take 100 mg by mouth at bedtime.     Historical Provider, MD  insulin glargine (LANTUS) 100 UNIT/ML injection Inject 5 Units into the skin daily.    Historical Provider, MD  insulin lispro (HUMALOG) 100 UNIT/ML injection Inject 0-9 Units into the skin 4 (four) times daily -  before meals and at bedtime. Pt uses as needed per sliding scale:    0-99:  0 units  100-149:  2 units  150-199:  3 units  200-249:  4 units  250-299:  5 units  300-349:  6 units 350-399:  7 units 400-449:  8 units 450-499:  9 units  500 or greater:  Call MD    Historical Provider, MD  iron polysaccharides (NIFEREX) 150 MG capsule Take 150 mg by mouth daily.    Historical Provider, MD  lactobacillus acidophilus (BACID) TABS tablet Take 1 tablet by mouth daily.     Historical Provider, MD  levETIRAcetam (KEPPRA) 500 MG tablet Take 1 tablet (500 mg total) by mouth every Monday, Wednesday, and Friday at 8 PM. 04/20/15   Shaune Pollack, MD  levETIRAcetam (KEPPRA) 750 MG tablet Take 1 tablet (750 mg total) by mouth daily. 04/20/15   Shaune Pollack, MD  lidocaine-prilocaine (EMLA) cream Apply 1 application topically as needed (prior to dialysis on Monday, Wednesday, and Friday).    Historical Provider, MD  Nutritional Supplements (FEEDING SUPPLEMENT, NEPRO CARB STEADY,) LIQD Take 237 mLs by mouth 2 (two) times daily between meals. 12/05/14   Gale Journey, MD  ranitidine (ZANTAC) 150 MG tablet Take 1 tablet (150 mg  total) by mouth at bedtime. 02/16/15   Amy Rusty Aus, NP      PHYSICAL EXAMINATION:   VITAL SIGNS: Blood pressure 107/69, pulse 89, temperature 98.7 F (37.1 C), temperature source Axillary, resp. rate 18, height 5\' 6"  (1.676 m), weight 61.1 kg (134 lb 11.2 oz), SpO2 100 %.  GENERAL:  70 y.o.-year-old patient lying in the bed with no acute distress.  EYES: Pupils equal, round, reactive to light and accommodation. No scleral icterus. Extraocular muscles intact. Some puffiness of eyelids. HEENT: Head atraumatic, normocephalic. Oropharynx and nasopharynx clear.  NECK:  Supple, no jugular venous distention. No thyroid enlargement, no tenderness.  LUNGS: Normal breath sounds bilaterally, no wheezing, some crepitation. No use of accessory muscles of respiration.  CARDIOVASCULAR: S1, S2 normal. Positive systolic murmurs, rubs, or gallops.  ABDOMEN: Soft, nontender, nondistended. Bowel sounds present. No organomegaly or mass.  EXTREMITIES: Positive pedal edema, no cyanosis, or clubbing.  NEUROLOGIC: Cranial nerves II through XII are intact. Muscle strength 3/5 in all extremities. Sensation intact. Gait not checked.  PSYCHIATRIC: The patient is alert and not oriented.  SKIN: No obvious rash, lesion, or ulcer.   LABORATORY PANEL:   CBC  Recent Labs Lab 05/22/15 1719  WBC 6.5  HGB 12.7  HCT 39.1  PLT 148*  MCV 95.1  MCH 31.0  MCHC 32.6  RDW 21.7*  LYMPHSABS 0.5*  MONOABS 0.7  EOSABS 0.0  BASOSABS 0.2*   ------------------------------------------------------------------------------------------------------------------  Chemistries   Recent Labs Lab 05/22/15 1719  NA 131*  K 6.4*  CL 91*  CO2 22  GLUCOSE 110*  BUN 43*  CREATININE 4.27*  CALCIUM 9.5   ------------------------------------------------------------------------------------------------------------------ estimated creatinine clearance is 11.6 mL/min (by C-G formula based on Cr of  4.27). ------------------------------------------------------------------------------------------------------------------ No results for input(s): TSH, T4TOTAL, T3FREE, THYROIDAB in the last 72 hours.  Invalid input(s): FREET3   Coagulation profile No results for input(s): INR, PROTIME in the last 168 hours. ------------------------------------------------------------------------------------------------------------------- No results for input(s): DDIMER in the last 72 hours. -------------------------------------------------------------------------------------------------------------------  Cardiac Enzymes  Recent Labs Lab 05/22/15 1719  TROPONINI 0.15*   ------------------------------------------------------------------------------------------------------------------ Invalid input(s): POCBNP  ---------------------------------------------------------------------------------------------------------------  Urinalysis    Component Value Date/Time   COLORURINE AMBER* 05/02/2015 2257   COLORURINE Yellow 04/22/2014 0702   APPEARANCEUR CLOUDY* 05/02/2015 2257   APPEARANCEUR Hazy 04/22/2014 0702   LABSPEC 1.020 05/02/2015 2257   LABSPEC 1.022 04/22/2014 0702   PHURINE 5.0 05/02/2015 2257   PHURINE 6.0 04/22/2014 0702   GLUCOSEU 50* 05/02/2015 2257   GLUCOSEU 150 mg/dL 40/98/1191 4782   HGBUR 1+* 05/02/2015 2257   HGBUR Negative 04/22/2014 0702   BILIRUBINUR NEGATIVE 05/02/2015 2257   BILIRUBINUR Negative 04/22/2014 0702   KETONESUR TRACE* 05/02/2015 2257   KETONESUR Negative 04/22/2014 0702   PROTEINUR >500* 05/02/2015 2257   PROTEINUR >=500 04/22/2014 0702   NITRITE NEGATIVE 05/02/2015 2257   NITRITE Negative 04/22/2014 0702   LEUKOCYTESUR 2+* 05/02/2015 2257   LEUKOCYTESUR 1+ 04/22/2014 0702     RADIOLOGY: Dg Chest Portable 1 View  05/22/2015  CLINICAL DATA:  Seizure activity today, end-stage renal disease, unresponsive, history coronary artery disease post MI, stroke,  hypertension, hyperlipidemia, diabetes mellitus, chronic systolic CHF EXAM: PORTABLE CHEST 1 VIEW COMPARISON:  Portable exam 1726 hours compared to 05/02/2015 FINDINGS: Enlargement of cardiac silhouette with pulmonary vascular congestion. Hazy perihilar infiltrates suggesting pulmonary edema, question CHF. Minimal central peribronchial thickening. No pleural effusion or pneumothorax. Bones demineralized. Atherosclerotic calcification aorta. IMPRESSION: Enlargement of cardiac silhouette with vascular congestion and subtle perihilar infiltrates favoring pulmonary edema and CHF Electronically Signed   By: Ulyses Southward M.D.   On: 05/22/2015 17:59    IMPRESSION AND PLAN:  * Hyperkalemia  ER gave injection calcium gluconate plus dextrose plus insulin.  Will admit to telemetry, and give Kayexalate oral.  He also spoke to nephrology for urgent hemodialysis most likely tonight.  * End-stage renal disease on hemodialysis  Nephrology consult to help with dialysis.  * Seizures  Patient takes Keppra at home  I will check Keppra level, continue the same home dose  Neurology consult to help.  * Diabetes  Continue baseline Lantus, and keep on insulin sliding scale coverage.  * Stroke  Continue Coreg, Plavix.   All the records are reviewed and case discussed with ED provider. Management plans discussed with the patient, family  and they are in agreement.  CODE STATUS: DO NOT RESUSCITATE Code Status History    Date Active Date Inactive Code Status Order ID Comments User Context   05/03/2015  2:25 AM 05/05/2015  9:20 PM DNR [id number]  Arnaldo NatalMichael S Diamond, MD Inpatient   04/28/2015  3:13 PM 04/30/2015  9:07 PM DNR [id number]  Wyatt Hasteavid K Hower, MD ED   04/13/2015  6:18 AM 04/21/2015  7:12 PM Full Code [id number]  Oralia Manisavid Willis, MD Inpatient   02/04/2015  5:09 PM 02/23/2015  9:12 PM Full Code [id number]  Altamese DillingVaibhavkumar Dilon Lank, MD Inpatient   02/04/2015 12:22 PM 02/04/2015  5:09 PM Full Code [id number]  Annice NeedyJason S Dew, MD  Inpatient   12/02/2014  5:37 PM 12/05/2014  6:47 PM Full Code [id number]  Houston SirenVivek J Sainani, MD Inpatient    Questions for Most Recent Historical Code Status (Order [id number])    Question Answer Comment   In the event of cardiac or respiratory ARREST Do not call a "code blue"    In the event of cardiac or respiratory ARREST Do not perform Intubation, CPR, defibrillation or ACLS    In the event of cardiac or respiratory ARREST Use medication by any route, position, wound care, and other measures to relive pain and suffering. May use oxygen, suction and manual treatment of airway obstruction as needed for comfort.       Plan discussed with patient's daughter and power of attorney was present in the room. TOTAL TIME TAKING CARE OF THIS PATIENT: 50 min.    Altamese DillingVACHHANI, Diar Berkel M.D on 05/22/2015   Between 7am to 6pm - Pager - [phone]  After 6pm go to [email or web address] - password [email or web address] [facility]  [facility]  Office  [phone]  CC: Primary care physician; Amy L Krebs, NP   Note: This dictation was prepared with Dragon dictation along with smaller phrase technology. Any transcriptional errors that result from this process are unintentional.

## 2015-05-22 NOTE — ED Provider Notes (Signed)
[facility] Emergency Department Provider Note   ____________________________________________  Time seen: On EMS arrival  I have reviewed the triage vital signs and the nursing notes.   HISTORY  Chief Complaint Respiratory Distress   History limited by: Altered mental status   HPI [name] is a 70 y.o. female who presents from living facility today because of facility concerns for seizures. Per EMS the patient has been having focal seizures starting yesterday per facility staff. EMS did not witness any seizure activity. They did have concerns however about patient's hypoxia. The patient had been satting in the 70s and 80s.The patient is on dialysis Monday Wednesday and Friday and did undergo her normal course of dialysis on Friday.   Past Medical History  Diagnosis Date  . Stroke (HCC)   . MI (myocardial infarction) (HCC)   . Hearing impaired   . Hyperlipidemia   . Hypertension   . Diabetes mellitus without complication (HCC)   . Depression   . Coronary artery disease   . Dementia   . Secondary hyperparathyroidism (HCC)   . Chronic systolic CHF (congestive heart failure) (HCC)   . GERD (gastroesophageal reflux disease)   . Personal history of transient ischemic attack (TIA) and cerebral infarction without residual deficit   . Anemia   . Moderate aortic stenosis     a. echo 04/2014: 40-50%, moderate AS b. echo 01/2015: EF 35-40%, Severe AS, mild MR, moderate TR  . ESRD (end stage renal disease) (HCC)     a. Dialysis MWF    Patient Active Problem List   Diagnosis Date Noted  . Confusion 05/03/2015  . Aortic valve disease   . Syncope 04/28/2015  . Malnutrition of moderate degree 04/15/2015  . Acute on chronic systolic CHF (congestive heart failure) (HCC) 04/13/2015  . Elevated troponin 04/13/2015  . Acute encephalopathy 04/13/2015  . CAD (coronary artery disease) 04/13/2015  . GERD (gastroesophageal reflux disease) 04/13/2015  .  Pressure ulcer 04/13/2015  . Altered mental status   . Aortic valve stenosis, severe   . Cardiomyopathy, ischemic   . Metabolic encephalopathy   . Respiratory failure (HCC)   . Dyspnea   . Severe aortic valve stenosis   . 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 NSTEMI (non-ST elevated myocardial infarction) (HCC)   . Cardiomyopathy, hypertensive, benign   . Angioedema 02/05/2015  . Acute respiratory failure (HCC) 02/05/2015  . Seizures (HCC) 12/02/2014  . Hyperlipidemia 08/11/2014  . End stage renal disease on dialysis (HCC) 05/12/2014  . Chronic systolic CHF (congestive heart failure) (HCC) 05/12/2014  . Secondary hypertension, unspecified 05/12/2014  . Type 2 diabetes mellitus with other circulatory complications (HCC) 05/12/2014  . Moderate aortic valve stenosis 05/12/2014    Past Surgical History  Procedure Laterality Date  . Cataract extraction, bilateral    . Wrist surgery    . Peripheral vascular catheterization Left 07/28/2014    Procedure: A/V Shuntogram/Fistulagram;  Surgeon: Renford DillsGregory G Schnier, MD;  Location: [facility] INVASIVE CV LAB;  Service: Cardiovascular;  Laterality: Left;  . Peripheral vascular catheterization Left 07/28/2014    Procedure: A/V Shunt Intervention;  Surgeon: Renford DillsGregory G Schnier, MD;  Location: [facility] INVASIVE CV LAB;  Service: Cardiovascular;  Laterality: Left;  . Peripheral vascular catheterization Left 02/04/2015    Procedure: A/V Shuntogram/Fistulagram;  Surgeon: Annice NeedyJason S Dew, MD;  Location: [facility] INVASIVE CV LAB;  Service: Cardiovascular;  Laterality: Left;  . Peripheral vascular catheterization  Left 02/04/2015    Procedure: A/V Shunt Intervention;  Surgeon: Annice Needy, MD;  Location: [facility] INVASIVE CV LAB;  Service: Cardiovascular;  Laterality: Left;    Current Outpatient Rx  Name  Route  Sig  Dispense  Refill  . aspirin EC 81 MG tablet   Oral   Take 81 mg by mouth  daily.         [name] atorvastatin (LIPITOR) 80 MG tablet   Oral   Take 1 tablet (80 mg total) by mouth daily.   30 tablet   1   . bisacodyl (DULCOLAX) 10 MG suppository   Rectal   Place 1 suppository (10 mg total) rectally daily as needed for moderate constipation.   12 suppository   0   . calcium acetate (PHOSLO) 667 MG capsule   Oral   Take 1,334 mg by mouth 3 (three) times daily with meals.          . carvedilol (COREG) 3.125 MG tablet   Oral   Take 1 tablet (3.125 mg total) by mouth 2 (two) times daily with a meal.   60 tablet   0   . cholecalciferol (VITAMIN D) 1000 units tablet   Oral   Take 2,000 Units by mouth daily.         . clopidogrel (PLAVIX) 75 MG tablet   Oral   Take 1 tablet (75 mg total) by mouth daily.   30 tablet   3   . Cranberry 450 MG TABS   Oral   Take 450 mg by mouth 2 (two) times daily.         [name] docusate sodium (COLACE) 100 MG capsule   Oral   Take 100 mg by mouth at bedtime.          . insulin glargine (LANTUS) 100 UNIT/ML injection   Subcutaneous   Inject 5 Units into the skin daily.         . insulin lispro (HUMALOG) 100 UNIT/ML injection   Subcutaneous   Inject 0-9 Units into the skin 4 (four) times daily -  before meals and at bedtime. Pt uses as needed per sliding scale:    0-99:  0 units  100-149:  2 units  150-199:  3 units  200-249:  4 units  250-299:  5 units  300-349:  6 units 350-399:  7 units 400-449:  8 units 450-499:  9 units  500 or greater:  Call MD         . iron polysaccharides (NIFEREX) 150 MG capsule   Oral   Take 150 mg by mouth daily.         [name] lactobacillus acidophilus (BACID) TABS tablet   Oral   Take 1 tablet by mouth daily.          [name] levETIRAcetam (KEPPRA) 500 MG tablet   Oral   Take 1 tablet (500 mg total) by mouth every Monday, Wednesday, and Friday at 8 PM.   30 tablet   1   . levETIRAcetam (KEPPRA) 750 MG tablet   Oral   Take 1 tablet (750 mg total) by mouth daily.    30 tablet   1   . lidocaine-prilocaine (EMLA) cream   Topical   Apply 1 application topically as needed (prior to dialysis on Monday, Wednesday, and Friday).         . Nutritional Supplements (FEEDING SUPPLEMENT, NEPRO CARB STEADY,) LIQD   Oral   Take 237 mLs by mouth 2 (  two) times daily between meals.   60 Can   0   . ranitidine (ZANTAC) 150 MG tablet   Oral   Take 1 tablet (150 mg total) by mouth at bedtime.   90 tablet   3     Allergies Contrast media  Family History  Problem Relation Age of Onset  . CAD Father     Social History Social History  Substance Use Topics  . Smoking status: Never Smoker   . Smokeless tobacco: None  . Alcohol Use: No    Review of Systems  Constitutional: Negative for fever. Cardiovascular: Negative for chest pain. Respiratory: Positive for shortness of breath Gastrointestinal: Negative for abdominal pain, vomiting and diarrhea. Neurological: Negative for headaches, focal weakness or numbness.  10-point ROS otherwise negative.  ____________________________________________   PHYSICAL EXAM:  VITAL SIGNS: ED Triage Vitals  Enc Vitals Group     BP --      Pulse Rate 05/22/15 1710 92     Resp --      Temp 05/22/15 1710 98.7 F (37.1 C)     Temp Source 05/22/15 1710 Axillary     SpO2 05/22/15 1706 79 %     Weight 05/22/15 1715 134 lb 11.2 oz (61.1 kg)     Height 05/22/15 1715  (1.676 m)   Constitutional: Awake and alert. Minimally verbal and less hard to assess orientation. Eyes: Conjunctivae are normal. PERRL. Normal extraocular movements. ENT   Head: Normocephalic and atraumatic.   Nose: No congestion/rhinnorhea.   Mouth/Throat: Mucous membranes are moist.   Neck: No stridor. Hematological/Lymphatic/Immunilogical: No cervical lymphadenopathy. Cardiovascular: Normal rate, regular rhythm.  No murmurs, rubs, or gallops. Respiratory: Normal respiratory effort without tachypnea nor retractions. Question  crackles in bilateral lower bases Gastrointestinal: Soft and nontender. No distention.  Genitourinary: Deferred Musculoskeletal: Normal range of motion in all extremities. No joint effusions.  No lower extremity tenderness nor edema. Neurologic:  Appears to move all extremities. Sensation appears grossly intact. Patient is awake and alert. Minimally verbal. Per EMS this appears to be her baseline. Skin:  Skin is warm, dry and intact. No rash noted.   ____________________________________________    LABS (pertinent positives/negatives)  Labs Reviewed  CBC WITH DIFFERENTIAL/PLATELET - Abnormal; Notable for the following:    RDW 21.7 (*)    Platelets 148 (*)    Lymphs Abs 0.5 (*)    Basophils Absolute 0.2 (*)    All other components within normal limits  BASIC METABOLIC PANEL - Abnormal; Notable for the following:    Sodium 131 (*)    Potassium 6.4 (*)    Chloride 91 (*)    Glucose, Bld 110 (*)    BUN 43 (*)    Creatinine, Ser 4.27 (*)    GFR calc non Af Amer 10 (*)    GFR calc Af Amer 11 (*)    Anion gap 18 (*)    All other components within normal limits  TROPONIN I - Abnormal; Notable for the following:    Troponin I 0.15 (*)    All other components within normal limits  LEVETIRACETAM LEVEL     ____________________________________________   EKG  I, Phineas Semen, attending physician, personally viewed and interpreted this EKG  EKG Time: 1716 Rate: 87 Rhythm: normal sinus rhythm Axis: right axis deviation Intervals: qtc 453 QRS: narrow, LPFB, q waves III, V1 ST changes: no st elevation Impression: abnormal ekg  ____________________________________________    RADIOLOGY  CXR IMPRESSION: Enlargement of cardiac silhouette with  vascular congestion and subtle perihilar infiltrates favoring pulmonary edema and CHF  ____________________________________________   PROCEDURES  Procedure(s) performed: None  Critical Care performed:  No  ____________________________________________   INITIAL IMPRESSION / ASSESSMENT AND PLAN / ED COURSE  Pertinent labs & imaging results that were available during my care of the patient were reviewed by me and considered in my medical decision making (see chart for details).  Patient presented to the emergency department today because of nursing facility concerning for seizure-like activity. However EMS noted the patient be hypoxic. They did not notice any seizure-like activity.  x-ray does show some pulmonary edema. Additionally patient's potassium was elevated. Because of this I think the patient would likely benefit from inpatient mission and dialysis. I do not think should be safe to go until Monday for her next dialysis appointment. Furthermore I will plan on giving the patient medications to help decrease the potassium level.  ____________________________________________   FINAL CLINICAL IMPRESSION(S) / ED DIAGNOSES  Final diagnoses:  Hypoxia  Acute pulmonary edema (HCC)  Hyperkalemia     Phineas Semen, MD 05/22/15 2033

## 2015-05-22 NOTE — ED Notes (Addendum)
Pt's saturation bouncing from 95% to70s-80s%, Pt put back on 2L nasal cannula at this time. EDP made aware

## 2015-05-22 NOTE — ED Notes (Signed)
Patient sent from [facility] for multiple seizures today, patient is on Keppra for seizures. Patient is dialysis patient, receives dialysis M,W,F. Patient had dialysis yesterday without complications.

## 2015-05-22 NOTE — ED Notes (Signed)
notifed of elevated troponin, EDP made aware

## 2015-05-22 NOTE — Progress Notes (Signed)
PHARMACIST - PHYSICIAN ORDER COMMUNICATION  CONCERNING: P&T Medication Policy on Herbal Medications  DESCRIPTION:  This patient's order for:  cranberry  has been noted.  This product(s) is classified as an "herbal" or natural product. Due to a lack of definitive safety studies or FDA approval, nonstandard manufacturing practices, plus the potential risk of unknown drug-drug interactions while on inpatient medications, the Pharmacy and Therapeutics Committee does not permit the use of "herbal" or natural products of this type within [facility].

## 2015-05-23 ENCOUNTER — Inpatient Hospital Stay: Payer: Medicare Other

## 2015-05-23 LAB — BASIC METABOLIC PANEL
ANION GAP: 9 (ref 5–15)
BUN: 31 mg/dL — AB (ref 6–20)
CO2: 31 mmol/L (ref 22–32)
Calcium: 8.9 mg/dL (ref 8.9–10.3)
Chloride: 95 mmol/L — ABNORMAL LOW (ref 101–111)
Creatinine, Ser: 3.02 mg/dL — ABNORMAL HIGH (ref 0.44–1.00)
GFR, EST AFRICAN AMERICAN: 17 mL/min — AB (ref >60)
GFR, EST NON AFRICAN AMERICAN: 15 mL/min — AB (ref >60)
Glucose, Bld: 112 mg/dL — ABNORMAL HIGH (ref 65–99)
POTASSIUM: 3.4 mmol/L — AB (ref 3.5–5.1)
SODIUM: 135 mmol/L (ref 135–145)

## 2015-05-23 LAB — CBC
HCT: 33.2 % — ABNORMAL LOW (ref 35.0–47.0)
Hemoglobin: 10.9 g/dL — ABNORMAL LOW (ref 12.0–16.0)
MCH: 30.3 pg (ref 26.0–34.0)
MCHC: 32.7 g/dL (ref 32.0–36.0)
MCV: 92.5 fL (ref 80.0–100.0)
PLATELETS: 165 10*3/uL (ref 150–440)
RBC: 3.59 MIL/uL — AB (ref 3.80–5.20)
RDW: 21 % — AB (ref 11.5–14.5)
WBC: 5.4 10*3/uL (ref 3.6–11.0)

## 2015-05-23 LAB — GLUCOSE, CAPILLARY
GLUCOSE-CAPILLARY: 242 mg/dL — AB (ref 65–99)
GLUCOSE-CAPILLARY: 250 mg/dL — AB (ref 65–99)
Glucose-Capillary: 94 mg/dL (ref 65–99)
Glucose-Capillary: 91 mg/dL (ref 65–99)
Glucose-Capillary: 114 mg/dL — ABNORMAL HIGH (ref 65–99)
Glucose-Capillary: 213 mg/dL — ABNORMAL HIGH (ref 65–99)

## 2015-05-23 LAB — MAGNESIUM: Magnesium: 2.1 mg/dL (ref 1.7–2.4)

## 2015-05-23 MED ORDER — POTASSIUM CHLORIDE 10 MEQ/100ML IV SOLN
10.0000 meq | INTRAVENOUS | Status: AC
  Administered 2015-05-23 (×2): 10 meq via INTRAVENOUS
  Filled 2015-05-23 (×2): qty 100

## 2015-05-23 MED ORDER — INSULIN ASPART 100 UNIT/ML ~~LOC~~ SOLN
0.0000 [IU] | Freq: Three times a day (TID) | SUBCUTANEOUS | Status: DC
  Administered 2015-05-23: 3 [IU] via SUBCUTANEOUS
  Administered 2015-05-24: 2 [IU] via SUBCUTANEOUS
  Administered 2015-05-24: 5 [IU] via SUBCUTANEOUS
  Administered 2015-05-24: 2 [IU] via SUBCUTANEOUS
  Administered 2015-05-24: 5 [IU] via SUBCUTANEOUS
  Administered 2015-05-25 (×2): 3 [IU] via SUBCUTANEOUS
  Administered 2015-05-25: 2 [IU] via SUBCUTANEOUS
  Administered 2015-05-25: 3 [IU] via SUBCUTANEOUS
  Administered 2015-05-26: 2 [IU] via SUBCUTANEOUS
  Administered 2015-05-27: 1 [IU] via SUBCUTANEOUS
  Administered 2015-05-27 (×2): 5 [IU] via SUBCUTANEOUS
  Administered 2015-05-27: 3 [IU] via SUBCUTANEOUS
  Administered 2015-05-28 (×2): 2 [IU] via SUBCUTANEOUS
  Administered 2015-05-28: 3 [IU] via SUBCUTANEOUS
  Administered 2015-05-29: 5 [IU] via SUBCUTANEOUS
  Filled 2015-05-23 (×2): qty 2
  Filled 2015-05-23 (×2): qty 3
  Filled 2015-05-23: qty 5
  Filled 2015-05-23: qty 2
  Filled 2015-05-23: qty 3
  Filled 2015-05-23: qty 5
  Filled 2015-05-23: qty 2
  Filled 2015-05-23 (×3): qty 3
  Filled 2015-05-23 (×2): qty 5
  Filled 2015-05-23: qty 2
  Filled 2015-05-23: qty 5
  Filled 2015-05-23: qty 1
  Filled 2015-05-23: qty 2

## 2015-05-23 MED ORDER — INSULIN ASPART 100 UNIT/ML ~~LOC~~ SOLN: 0.0000 [IU] | Freq: Three times a day (TID) | SUBCUTANEOUS | Status: DC

## 2015-05-23 NOTE — Progress Notes (Signed)
[facility] - [place] at [facility]   PATIENT NAME: [name]    MR#:  [id number]  DATE OF BIRTH:  Feb 27, 1946  SUBJECTIVE: 70 year old female patient admitted yesterday secondary to noted distress. Found to have pulmonary edema, hyperkalemia. Sent is a poor historian and she  not able to give any history.   CHIEF COMPLAINT:   Chief Complaint  Patient presents with  . Respiratory Distress    REVIEW OF SYSTEMS:    Review of Systems  Unable to perform ROS: dementia    Nutrition:  Tolerating Diet: No Tolerating PT:      DRUG ALLERGIES:   Allergies  Allergen Reactions  . Contrast Media [Iodinated Diagnostic Agents] Anaphylaxis    VITALS:  Blood pressure 94/53, pulse 66, temperature 98.6 F (37 C), temperature source Oral, resp. rate 16, height  (1.676 m), weight 64.4 kg (141 lb 15.6 oz), SpO2 98 %.  PHYSICAL EXAMINATION:   Physical Exam  GENERAL:  70 y.o.-year-old patient lying in the bed with no acute distress.  EYES: Pupils equal, round, reactive to light and accommodation. No scleral icterus. Extraocular muscles intact.  HEENT: Head atraumatic, normocephalic. Oropharynx and nasopharynx clear.  NECK:  Supple, no jugular venous distention. No thyroid enlargement, no tenderness.  LUNGS: Normal breath sounds bilaterally, no wheezing, rales,rhonchi or crepitation. No use of accessory muscles of respiration.  CARDIOVASCULAR: S1, S2 normal. No murmurs, rubs, or gallops.  ABDOMEN: Soft, nontender, nondistended. Bowel sounds present. No organomegaly or mass.  EXTREMITIES: No pedal edema, cyanosis, or clubbing.  NEUROLOGIC: Unable to do full neurological exam because of her mental status.  PSYCHIATRIC: Patient is slightly lethargic.  SKIN: No obvious rash, lesion, or ulcer.    LABORATORY PANEL:   CBC  Recent Labs Lab 05/22/15 2230  WBC 5.4  HGB 10.9*  HCT 33.2*  PLT 165    ------------------------------------------------------------------------------------------------------------------  Chemistries   Recent Labs Lab 05/22/15 2230 05/23/15 0639  NA 135  --   K 3.4*  --   CL 95*  --   CO2 31  --   GLUCOSE 112*  --   BUN 31*  --   CREATININE 3.02*  --   CALCIUM 8.9  --   MG  --  2.1   ------------------------------------------------------------------------------------------------------------------  Cardiac Enzymes  Recent Labs Lab 05/22/15 1719  TROPONINI 0.15*   ------------------------------------------------------------------------------------------------------------------  RADIOLOGY:  Dg Chest Portable 1 View  05/22/2015  CLINICAL DATA:  Seizure activity today, end-stage renal disease, unresponsive, history coronary artery disease post MI, stroke, hypertension, hyperlipidemia, diabetes mellitus, chronic systolic CHF EXAM: PORTABLE CHEST 1 VIEW COMPARISON:  Portable exam 1726 hours compared to 05/02/2015 FINDINGS: Enlargement of cardiac silhouette with pulmonary vascular congestion. Hazy perihilar infiltrates suggesting pulmonary edema, question CHF. Minimal central peribronchial thickening. No pleural effusion or pneumothorax. Bones demineralized. Atherosclerotic calcification aorta. IMPRESSION: Enlargement of cardiac silhouette with vascular congestion and subtle perihilar infiltrates favoring pulmonary edema and CHF Electronically Signed   By: Ulyses Southward M.D.   On: 05/22/2015 17:59     ASSESSMENT AND PLAN:   Principal Problem:   Pulmonary edema Active Problems:   Seizures (HCC)   Hyperkalemia   #1 acute respiratory failure secondary to pulmonary edema in the contest of ESRD: Consult nephrology for dialysis needs, continue oxygen. #2 hyperkalemia with EKG changes and also  Had NSVT  at this morning. Received calcium gluconate, dextrose, insulin. Potassium decreased from 6.5-3.4. #3 history of coronary artery disease: Continue  Plavix. #4 dementia #6 diabetes mellitus  type 2: Patient is nothing by mouth use sliding scale with coverage only #7 history of seizure disorder: Continue Keppra can use IV in case if she cannot swallow. #8 hypertension today; so hold the Coreg/  DNR  All the records are reviewed and case discussed with Care Management/Social Workerr. Management plans discussed with the patient, family and they are in agreement.  CODE STATUS: DNR TOTAL TIME TAKING CARE OF THIS PATIENT: 35minutes.   POSSIBLE D/C IN 3-4DAYS, DEPENDING ON CLINICAL CONDITION.   Katha HammingKONIDENA,Hisashi Amadon M.D on 05/23/2015 at 11:00 AM  Between 7am to 6pm - Pager - [phone]  After 6pm go to [email or web address] - password [email or web address]  [facility]  Office  [phone]  CC: Primary care physician; Amy Diana EvesL Krebs, NP

## 2015-05-23 NOTE — Consult Note (Signed)
CC: seizure  HPI: [name] is an 70 y.o. female with a known history of stroke, wheelchair-bound and currently in nursing home for rehabilitation, MI, hyperlipidemia, hypertension, diabetes, coronary artery disease, secondary hyperparathyroidism, chronic systolic CHF, end-stage renal disease on hemodialysis,  chronic anemia. Presented with seizure activity thought to be tonic clonic in nature. No history of seizure in the past. At baseline pt is demented and confused.    Past Medical History  Diagnosis Date  . Stroke (HCC)   . MI (myocardial infarction) (HCC)   . Hearing impaired   . Hyperlipidemia   . Hypertension   . Diabetes mellitus without complication (HCC)   . Depression   . Coronary artery disease   . Dementia   . Secondary hyperparathyroidism (HCC)   . Chronic systolic CHF (congestive heart failure) (HCC)   . GERD (gastroesophageal reflux disease)   . Personal history of transient ischemic attack (TIA) and cerebral infarction without residual deficit   . Anemia   . Moderate aortic stenosis     a. echo 04/2014: 40-50%, moderate AS b. echo 01/2015: EF 35-40%, Severe AS, mild MR, moderate TR  . ESRD (end stage renal disease) (HCC)     a. Dialysis MWF    Past Surgical History  Procedure Laterality Date  . Cataract extraction, bilateral    . Wrist surgery    . Peripheral vascular catheterization Left 07/28/2014    Procedure: A/V Shuntogram/Fistulagram;  Surgeon: Renford DillsGregory G Schnier, MD;  Location: [facility] INVASIVE CV LAB;  Service: Cardiovascular;  Laterality: Left;  . Peripheral vascular catheterization Left 07/28/2014    Procedure: A/V Shunt Intervention;  Surgeon: Renford DillsGregory G Schnier, MD;  Location: [facility] INVASIVE CV LAB;  Service: Cardiovascular;  Laterality: Left;  . Peripheral vascular catheterization Left 02/04/2015    Procedure: A/V Shuntogram/Fistulagram;  Surgeon: Annice NeedyJason S Dew, MD;  Location: [facility] INVASIVE CV LAB;  Service: Cardiovascular;  Laterality: Left;  .  Peripheral vascular catheterization Left 02/04/2015    Procedure: A/V Shunt Intervention;  Surgeon: Annice NeedyJason S Dew, MD;  Location: [facility] INVASIVE CV LAB;  Service: Cardiovascular;  Laterality: Left;    Family History  Problem Relation Age of Onset  . CAD Father     Social History:  reports that she has never smoked. She does not have any smokeless tobacco history on file. She reports that she does not drink alcohol or use illicit drugs.  Allergies  Allergen Reactions  . Contrast Media [Iodinated Diagnostic Agents] Anaphylaxis    Medications: I have reviewed the patient's current medications.  ROS: Unable to obtain due to confusion/disorientation   Physical Examination: Blood pressure 119/64, pulse 78, temperature 97.9 F (36.6 C), temperature source Oral, resp. rate 17, height 5\' 6"  (1.676 m), weight 141 lb 15.6 oz (64.4 kg), SpO2 100 %.   Neurological Examination Mental Status: Alert to name only  Cranial Nerves: II: Discs flat bilaterally; Visual fields grossly normal, pupils equal, round, reactive to light and accommodation III,IV, VI: ptosis not present, extra-ocular motions intact bilaterally V,VII: smile symmetric, facial light touch sensation normal bilaterally VIII: hearing normal bilaterally IX,X: gag reflex present XI: bilateral shoulder shrug XII: midline tongue extension Motor: Right : Upper extremity   5/5    Left:     Upper extremity   5/5  Lower extremity   4+/5     Lower extremity   4+/5 Tone and bulk:normal tone throughout; no atrophy noted Sensory: Pinprick and light touch intact throughout, bilaterally Deep Tendon Reflexes: 1+ and symmetric throughout Plantars:  Right: downgoing   Left: downgoing Cerebellar: normal finger-to-nose, normal rapid alternating movements and normal heel-to-shin test Gait: not tested.       Laboratory Studies:   Basic Metabolic Panel:  Recent Labs Lab 05/22/15 1719 05/22/15 2230 05/23/15 0639  NA 131* 135  --   K  6.4* 3.4*  --   CL 91* 95*  --   CO2 22 31  --   GLUCOSE 110* 112*  --   BUN 43* 31*  --   CREATININE 4.27* 3.02*  --   CALCIUM 9.5 8.9  --   MG  --   --  2.1    Liver Function Tests: No results for input(s): AST, ALT, ALKPHOS, BILITOT, PROT, ALBUMIN in the last 168 hours. No results for input(s): LIPASE, AMYLASE in the last 168 hours. No results for input(s): AMMONIA in the last 168 hours.  CBC:  Recent Labs Lab 05/22/15 1719 05/22/15 2230  WBC 6.5 5.4  NEUTROABS 5.2  --   HGB 12.7 10.9*  HCT 39.1 33.2*  MCV 95.1 92.5  PLT 148* 165    Cardiac Enzymes:  Recent Labs Lab 05/22/15 1719  TROPONINI 0.15*    BNP: Invalid input(s): POCBNP  CBG:  Recent Labs Lab 05/23/15 0213 05/23/15 0800 05/23/15 1126 05/23/15 1628  GLUCAP 94 91 114* 213*    Microbiology: Results for orders placed or performed during the hospital encounter of 05/22/15  MRSA PCR Screening     Status: None   Collection Time: 05/22/15  9:32 PM  Result Value Ref Range Status   MRSA by PCR NEGATIVE NEGATIVE Final    Comment:        The GeneXpert MRSA Assay (FDA approved for NASAL specimens only), is one component of a comprehensive MRSA colonization surveillance program. It is not intended to diagnose MRSA infection nor to guide or monitor treatment for MRSA infections.     Coagulation Studies: No results for input(s): LABPROT, INR in the last 72 hours.  Urinalysis: No results for input(s): COLORURINE, LABSPEC, PHURINE, GLUCOSEU, HGBUR, BILIRUBINUR, KETONESUR, PROTEINUR, UROBILINOGEN, NITRITE, LEUKOCYTESUR in the last 168 hours.  Invalid input(s): APPERANCEUR  Lipid Panel:     Component Value Date/Time   CHOL 256* 04/22/2014 0220   TRIG 32 02/07/2015 0211   TRIG 22 04/22/2014 0220   HDL 73* 04/22/2014 0220   VLDL 4* 04/22/2014 0220   LDLCALC 179* 04/22/2014 0220    HgbA1C:  Lab Results  Component Value Date   HGBA1C 6.4* 05/02/2015    Urine Drug Screen:  No results  found for: LABOPIA, COCAINSCRNUR, LABBENZ, AMPHETMU, THCU, LABBARB  Alcohol Level: No results for input(s): ETH in the last 168 hours.  Other results: EKG: normal EKG, normal sinus rhythm, unchanged from previous tracings.  Imaging: Dg Chest Portable 1 View  05/22/2015  CLINICAL DATA:  Seizure activity today, end-stage renal disease, unresponsive, history coronary artery disease post MI, stroke, hypertension, hyperlipidemia, diabetes mellitus, chronic systolic CHF EXAM: PORTABLE CHEST 1 VIEW COMPARISON:  Portable exam 1726 hours compared to 05/02/2015 FINDINGS: Enlargement of cardiac silhouette with pulmonary vascular congestion. Hazy perihilar infiltrates suggesting pulmonary edema, question CHF. Minimal central peribronchial thickening. No pleural effusion or pneumothorax. Bones demineralized. Atherosclerotic calcification aorta. IMPRESSION: Enlargement of cardiac silhouette with vascular congestion and subtle perihilar infiltrates favoring pulmonary edema and CHF Electronically Signed   By: Ulyses Southward M.D.   On: 05/22/2015 17:59     Assessment/Plan:  70 y.o. female with a known history of stroke, wheelchair-bound and  currently in nursing home for rehabilitation, MI, hyperlipidemia, hypertension, diabetes, coronary artery disease, secondary hyperparathyroidism, chronic systolic CHF, end-stage renal disease on hemodialysis,  chronic anemia. Presented with seizure activity thought to be tonic clonic in nature. No history of seizure in the past. At baseline pt is demented and confused.   1. Seizure:  - could be in setting of pulmonary edema and hyperkelemia.   - no imaging done. CTH non contrast ordered.   - Keppra daily and post dialysis ordered.  Appears to be first time seizure.  If not clear abnormalities on imaging, would only con't Keppra for 7 days for acute seizure prophylaxis.   - EEG as out pt.   Pauletta Browns   05/23/2015, 4:55 PM

## 2015-05-23 NOTE — Progress Notes (Signed)
Subjective:  Patient known to us from prior admissions. Patient underwent urgent hemodialysis treatment on Saturday for hyperkalemia Feels well today. No acute complaints Follows commands but nonverbal  Objective:  Vital signs in last 24 hours:  Temp:  [97.9 F (36.6 C)-98.7 F (37.1 C)] 97.9 F (36.6 C) (03/05 1131) Pulse Rate:  [66-92] 78 (03/05 1131) Resp:  [14-25] 17 (03/05 1131) BP: (89-119)/(49-75) 119/64 mmHg (03/05 1131) SpO2:  [79 %-100 %] 100 % (03/05 1131) Weight:  [61.1 kg (134 lb 11.2 oz)-65.4 kg (144 lb 2.9 oz)] 64.4 kg (141 lb 15.6 oz) (03/05 0049)  Weight change:  Filed Weights   05/22/15 2116 05/22/15 2200 05/23/15 0049  Weight: 64.501 kg (142 lb 3.2 oz) 65.4 kg (144 lb 2.9 oz) 64.4 kg (141 lb 15.6 oz)    Intake/Output:    Intake/Output Summary (Last 24 hours) at 05/23/15 1518 Last data filed at 05/23/15 1300  Gross per 24 hour  Intake      0 ml  Output    353 ml  Net   -353 ml     Physical Exam: General:  no acute distress, laying in the bed, thin, frail   HEENT  moist mucous membranes   Neck  supple   Pulm/lungs  normal effort, decreased breath sounds at bases   CVS/Heart  irregular   Abdomen:   soft, nontender   Extremities:  trace peripheral edema   Neurologic:  able to follow commands but nonverbal   Skin:  no rashes   Access:        Basic Metabolic Panel:   Recent Labs Lab 05/22/15 1719 05/22/15 2230 05/23/15 0639  NA 131* 135  --   K 6.4* 3.4*  --   CL 91* 95*  --   CO2 22 31  --   GLUCOSE 110* 112*  --   BUN 43* 31*  --   CREATININE 4.27* 3.02*  --   CALCIUM 9.5 8.9  --   MG  --   --  2.1     CBC:  Recent Labs Lab 05/22/15 1719 05/22/15 2230  WBC 6.5 5.4  NEUTROABS 5.2  --   HGB 12.7 10.9*  HCT 39.1 33.2*  MCV 95.1 92.5  PLT 148* 165      Microbiology:  Recent Results (from the past 720 hour(s))  MRSA PCR Screening     Status: None   Collection Time: 05/22/15  9:32 PM  Result Value Ref Range Status    MRSA by PCR NEGATIVE NEGATIVE Final    Comment:        The GeneXpert MRSA Assay (FDA approved for NASAL specimens only), is one component of a comprehensive MRSA colonization surveillance program. It is not intended to diagnose MRSA infection nor to guide or monitor treatment for MRSA infections.     Coagulation Studies: No results for input(s): LABPROT, INR in the last 72 hours.  Urinalysis: No results for input(s): COLORURINE, LABSPEC, PHURINE, GLUCOSEU, HGBUR, BILIRUBINUR, KETONESUR, PROTEINUR, UROBILINOGEN, NITRITE, LEUKOCYTESUR in the last 72 hours.  Invalid input(s): APPERANCEUR    Imaging: Dg Chest Portable 1 View  05/22/2015  CLINICAL DATA:  Seizure activity today, end-stage renal disease, unresponsive, history coronary artery disease post MI, stroke, hypertension, hyperlipidemia, diabetes mellitus, chronic systolic CHF EXAM: PORTABLE CHEST 1 VIEW COMPARISON:  Portable exam 1726 hours compared to 05/02/2015 FINDINGS: Enlargement of cardiac silhouette with pulmonary vascular congestion. Hazy perihilar infiltrates suggesting pulmonary edema, question CHF. Minimal central peribronchial thickening. No pleural effusion or  pneumothorax. Bones demineralized. Atherosclerotic calcification aorta. IMPRESSION: Enlargement of cardiac silhouette with vascular congestion and subtle perihilar infiltrates favoring pulmonary edema and CHF Electronically Signed   By: Ulyses Southward M.D.   On: 05/22/2015 17:59     Medications:     . aspirin EC  81 mg Oral Daily  . atorvastatin  80 mg Oral Daily  . calcium acetate  1,334 mg Oral TID WC  . cholecalciferol  2,000 Units Oral Daily  . clopidogrel  75 mg Oral Daily  . docusate sodium  100 mg Oral QHS  . famotidine  20 mg Oral Daily  . feeding supplement (NEPRO CARB STEADY)  237 mL Oral BID BM  . heparin  5,000 Units Subcutaneous 3 times per day  . insulin aspart  0-9 Units Subcutaneous TID WC  . insulin glargine  5 Units Subcutaneous Daily   . iron polysaccharides  150 mg Oral Daily  . lactobacillus acidophilus  1 tablet Oral Daily  . [START ON 05/24/2015] levETIRAcetam  500 mg Oral Q M,W,F-2000  . levETIRAcetam  750 mg Oral Q24H  . sodium chloride flush  3 mL Intravenous Q12H  . sodium polystyrene  30 g Oral Once   bisacodyl, lidocaine-prilocaine  Assessment/ Plan:  70 y.o. female with past medical history of end-stage renal disease, myocardial infarction, CVA, hyperlipidemia, hypertension, diabetes mellitus, depression, secondary hyperparathyroidism, dementia, GERD, anemia chronic kidney disease, History of angioedema due to oxygen tubing.   [facility] MWF [facility] [address]   1. End-stage renal disease with hyperkalemia:  Continue MWF schedule.  Urgent treatment on Saturday for hyperkalemia. Next dialysis Monday  2. Anemia chronic kidney disease. Hemoglobin 10.9 - holding Procrit due to concern of seizures  3. Secondary hyperparathyroidism: - We will monitor phosphorus during hospitalization  4. Seizures noted at the nursing home Evaluation in progress   LOS: 1 [name] 3/5/20173:18 PM

## 2015-05-23 NOTE — Progress Notes (Signed)
RN made aware by CCMD clerk that pt had 14 beat run of VTach. MD Pyreddy notified. Verbal order given for mg level, and 2 runs of 10 meq IV potassium. Will continue to monitor.   [name], [name]

## 2015-05-24 DIAGNOSIS — R569 Unspecified convulsions: Secondary | ICD-10-CM

## 2015-05-24 LAB — DIFFERENTIAL
BASOS ABS: 0 10*3/uL (ref 0–0.1)
Basophils Relative: 1 %
EOS ABS: 0 10*3/uL (ref 0–0.7)
Eosinophils Relative: 0 %
LYMPHS ABS: 0.5 10*3/uL — AB (ref 1.0–3.6)
LYMPHS PCT: 8 %
Monocytes Absolute: 0.7 10*3/uL (ref 0.2–0.9)
Monocytes Relative: 13 %
NEUTROS ABS: 4.3 10*3/uL (ref 1.4–6.5)
NEUTROS PCT: 78 %

## 2015-05-24 LAB — CBC
HCT: 34.7 % — ABNORMAL LOW (ref 35.0–47.0)
HEMOGLOBIN: 11 g/dL — AB (ref 12.0–16.0)
MCH: 29.3 pg (ref 26.0–34.0)
MCHC: 31.8 g/dL — ABNORMAL LOW (ref 32.0–36.0)
MCV: 91.9 fL (ref 80.0–100.0)
Platelets: 188 10*3/uL (ref 150–440)
RBC: 3.77 MIL/uL — AB (ref 3.80–5.20)
RDW: 21.1 % — ABNORMAL HIGH (ref 11.5–14.5)
WBC: 5.6 10*3/uL (ref 3.6–11.0)

## 2015-05-24 LAB — RENAL FUNCTION PANEL
ALBUMIN: 2.7 g/dL — AB (ref 3.5–5.0)
ANION GAP: 12 (ref 5–15)
BUN: 50 mg/dL — ABNORMAL HIGH (ref 6–20)
CALCIUM: 9.3 mg/dL (ref 8.9–10.3)
CO2: 28 mmol/L (ref 22–32)
Chloride: 94 mmol/L — ABNORMAL LOW (ref 101–111)
Creatinine, Ser: 4.76 mg/dL — ABNORMAL HIGH (ref 0.44–1.00)
GFR calc non Af Amer: 9 mL/min — ABNORMAL LOW (ref >60)
GFR, EST AFRICAN AMERICAN: 10 mL/min — AB (ref >60)
Glucose, Bld: 338 mg/dL — ABNORMAL HIGH (ref 65–99)
PHOSPHORUS: 2.1 mg/dL — AB (ref 2.5–4.6)
Potassium: 5 mmol/L (ref 3.5–5.1)
SODIUM: 134 mmol/L — AB (ref 135–145)

## 2015-05-24 LAB — GLUCOSE, CAPILLARY
GLUCOSE-CAPILLARY: 194 mg/dL — AB (ref 65–99)
GLUCOSE-CAPILLARY: 183 mg/dL — AB (ref 65–99)
GLUCOSE-CAPILLARY: 280 mg/dL — AB (ref 65–99)
Glucose-Capillary: 294 mg/dL — ABNORMAL HIGH (ref 65–99)

## 2015-05-24 LAB — HEPATITIS B SURFACE ANTIGEN: Hepatitis B Surface Ag: NEGATIVE

## 2015-05-24 LAB — LEVETIRACETAM LEVEL: Levetiracetam Lvl: 24.1 ug/mL (ref 10.0–40.0)

## 2015-05-24 MED ORDER — SODIUM POLYSTYRENE SULFONATE 15 GM/60ML PO SUSP
30.0000 g | Freq: Once | ORAL | Status: AC
  Administered 2015-05-24: 30 g via ORAL

## 2015-05-24 NOTE — Progress Notes (Signed)
To Dialysis via bed 

## 2015-05-24 NOTE — Care Management (Addendum)
Readmit from [facility] for what appears to be new seizure.  CSW aware.  This is the fourth admission since February .  It is documented a ? New seizure but patient presented in jan with sx concerning for seizure.  Discussed need for palliative care but informed that palliative team and daughter did not get along during a previous admission.  Have discussed when patient discharges back to skilled nursing to have palliative care follow.  Do not know if this has been ordered.  It is verbally reported that daughter has been heard saying that she does not know if she will continue dialysis and there has been discussion of taking patient home- maybe with hospice services.  CM will reach out to speak with daughter about goals of treatment.

## 2015-05-24 NOTE — Psychosocial Assessment (Signed)
Notified Dr Winona LegatoVaickute of patient status. Order to transfer patient to any med-surg. Kayexalate ordered and given for K of 5.0. [facility]

## 2015-05-24 NOTE — Progress Notes (Signed)
Post hd tx 

## 2015-05-24 NOTE — Plan of Care (Signed)
Problem: Education: Goal: Knowledge of [place] [facility] Education information/materials will improve Outcome: Not Progressing Pt non verbal  Problem: Safety: Goal: Ability to remain free from injury will improve Outcome: Progressing Safety precautions in place  Problem: Physical Regulation: Goal: Ability to maintain clinical measurements within normal limits will improve Outcome: Not Progressing Bed bound  Problem: Tissue Perfusion: Goal: Risk factors for ineffective tissue perfusion will decrease Outcome: Progressing SQ heparin

## 2015-05-24 NOTE — Progress Notes (Signed)
[facility] - [place] at [facility]   PATIENT NAME: Destinee Taber    MR#:  [id number]  DATE OF BIRTH:  1945/04/28  SUBJECTIVE: 70 year old female patient admitted  secondary to noted distress. Found to have pulmonary edema, hyperkalemia. She  is a poor historian and she  not able to give any history. She is awake today but not verbal.  CHIEF COMPLAINT:   Chief Complaint  Patient presents with  . Respiratory Distress    REVIEW OF SYSTEMS:    Review of Systems  Unable to perform ROS: dementia    Nutrition:  Tolerating Diet: No Tolerating PT:      DRUG ALLERGIES:   Allergies  Allergen Reactions  . Contrast Media [Iodinated Diagnostic Agents] Anaphylaxis    VITALS:  Blood pressure 115/64, pulse 78, temperature 97.7 F (36.5 C), temperature source Oral, resp. rate 16, height  (1.676 m), weight 64.4 kg (141 lb 15.6 oz), SpO2 96 %.  PHYSICAL EXAMINATION:   Physical Exam  GENERAL:  70 y.o.-year-old patient lying in the bed with no acute distress.  EYES: Pupils equal, round, reactive to light and accommodation. No scleral icterus. Extraocular muscles intact.  HEENT: Head atraumatic, normocephalic. Oropharynx and nasopharynx clear.  NECK:  Supple, no jugular venous distention. No thyroid enlargement, no tenderness.  LUNGS: Normal breath sounds bilaterally, no wheezing, rales,rhonchi or crepitation. No use of accessory muscles of respiration.  CARDIOVASCULAR: S1, S2 normal. No murmurs, rubs, or gallops.  ABDOMEN: Soft, nontender, nondistended. Bowel sounds present. No organomegaly or mass.  EXTREMITIES: No pedal edema, cyanosis, or clubbing.  NEUROLOGIC: Unable to do full neurological exam because of her dementia. PSYCHIATRIC: Patient is slightly lethargic.  SKIN: No obvious rash, lesion, or ulcer.    LABORATORY PANEL:   CBC  Recent Labs Lab 05/22/15 2230  WBC 5.4  HGB 10.9*  HCT 33.2*  PLT 165    ------------------------------------------------------------------------------------------------------------------  Chemistries   Recent Labs Lab 05/22/15 2230 05/23/15 0639  NA 135  --   K 3.4*  --   CL 95*  --   CO2 31  --   GLUCOSE 112*  --   BUN 31*  --   CREATININE 3.02*  --   CALCIUM 8.9  --   MG  --  2.1   ------------------------------------------------------------------------------------------------------------------  Cardiac Enzymes  Recent Labs Lab 05/22/15 1719  TROPONINI 0.15*   ------------------------------------------------------------------------------------------------------------------  RADIOLOGY:  Ct Head Wo Contrast  05/23/2015  CLINICAL DATA:  Seizure activity, history of stroke, dementia EXAM: CT HEAD WITHOUT CONTRAST TECHNIQUE: Contiguous axial images were obtained from the base of the skull through the vertex without intravenous contrast. COMPARISON:  MRI brain dated 05/03/2015.  CT head dated 05/02/2015. FINDINGS: No evidence of parenchymal hemorrhage or extra-axial fluid collection. No mass lesion, mass effect, or midline shift. No CT evidence of acute infarction. Encephalomalacic changes related to old left parietal and occipital infarcts. Extensive small vessel ischemic changes. Intracranial atherosclerosis. Global cortical and central atrophy. Secondary ventriculomegaly. Ex vacuo dilatation of the left lateral ventricle. The visualized paranasal sinuses are essentially clear. The mastoid air cells are unopacified. No evidence of calvarial fracture. IMPRESSION: No evidence of acute intracranial abnormality. 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 Atrophy with extensive small vessel ischemic changes. Secondary ventriculomegaly. Electronically Signed   By: Charline Bills M.D.   On: 05/23/2015 18:03   Dg Chest Portable 1 View  05/22/2015  CLINICAL DATA:  Seizure activity today, end-stage renal disease, unresponsive, history coronary artery disease  post  MI, stroke, hypertension, hyperlipidemia, diabetes mellitus, chronic systolic CHF EXAM: PORTABLE CHEST 1 VIEW COMPARISON:  Portable exam 1726 hours compared to 05/02/2015 FINDINGS: Enlargement of cardiac silhouette with pulmonary vascular congestion. Hazy perihilar infiltrates suggesting pulmonary edema, question CHF. Minimal central peribronchial thickening. No pleural effusion or pneumothorax. Bones demineralized. Atherosclerotic calcification aorta. IMPRESSION: Enlargement of cardiac silhouette with vascular congestion and subtle perihilar infiltrates favoring pulmonary edema and CHF Electronically Signed   By: Ulyses SouthwardMark  Boles M.D.   On: 05/22/2015 17:59     ASSESSMENT AND PLAN:   Principal Problem:   Pulmonary edema Active Problems:   Seizures (HCC)   Hyperkalemia   #1 acute respiratory failure secondary to pulmonary edema in the contest of ESRD: Hemodialysis today. Received emergency hemodialysis on admission because of hyperkalemia and respiratory distress.  #2 hyperkalemia with EKG changes and also received hemodialysis  Had NSVT  at this morning. Received calcium gluconate, dextrose, insulin. Potassium decreased from 6.5-3.4. #3 history of coronary artery disease: Continue Plavix. #4 dementia #6 diabetes mellitus type 2: Patient is nothing by mouth use sliding scale with coverage only #7 possible seizure at NH<CT head showed old infarcts,  And atrophy.  . 8  hypertension today;controlled Possible d/c tomorrow   DNR  All the records are reviewed and case discussed with Care Management/Social Workerr. Management plans discussed with the patient, family and they are in agreement.  CODE STATUS: DNR TOTAL TIME TAKING CARE OF THIS PATIENT: 20 minutes.   POSSIBLE D/C IN 3-4DAYS, DEPENDING ON CLINICAL CONDITION.   Katha HammingKONIDENA,Tennis Mckinnon M.D on 05/24/2015 at 11:13 AM  Between 7am to 6pm - Pager - [phone]  After 6pm go to [email or web address] - password [email or web address] [facility]  [facility] [place]  [facility]  Office  [phone]  CC: Primary care physician; Amy Diana EvesL Krebs, NP

## 2015-05-24 NOTE — Progress Notes (Signed)
Subjective:   Objective: Current vital signs: BP 115/64 mmHg  Pulse 78  Temp(Src) 97.7 F (36.5 C) (Oral)  Resp 16  Ht  (1.676 m)  Wt 64.4 kg (141 lb 15.6 oz)  BMI 22.93 kg/m2  SpO2 96% Vital signs in last 24 hours: Temp:  [97.7 F (36.5 C)-99.1 F (37.3 C)] 97.7 F (36.5 C) (03/06 0838) Pulse Rate:  [78-84] 78 (03/06 0843) Resp:  [16-18] 16 (03/06 0838) BP: (101-119)/(64-69) 115/64 mmHg (03/06 0838) SpO2:  [96 %-100 %] 96 % (03/06 0843)  Intake/Output from previous day: 03/05 0701 - 03/06 0700 In: 120 [P.O.:120] Out: 0  Intake/Output this shift:   Nutritional status: DIET - DYS 1 Room service appropriate?: Yes; Fluid consistency:: Thin  Neurologic Exam: Mental Status: Alerts to name.  No speech.  Does not follow commands.   Cranial Nerves: II: Discs flat bilaterally; Pupils equal, round, reactive to light and accommodation III,IV, VI: ptosis not present, oculocephalic responses intact Motor: Moves upper extremities spontaneously.     Lab Results: Basic Metabolic Panel:  Recent Labs Lab 05/22/15 1719 05/22/15 2230 05/23/15 0639  NA 131* 135  --   K 6.4* 3.4*  --   CL 91* 95*  --   CO2 22 31  --   GLUCOSE 110* 112*  --   BUN 43* 31*  --   CREATININE 4.27* 3.02*  --   CALCIUM 9.5 8.9  --   MG  --   --  2.1    Liver Function Tests: No results for input(s): AST, ALT, ALKPHOS, BILITOT, PROT, ALBUMIN in the last 168 hours. No results for input(s): LIPASE, AMYLASE in the last 168 hours. No results for input(s): AMMONIA in the last 168 hours.  CBC:  Recent Labs Lab 05/22/15 1719 05/22/15 2230  WBC 6.5 5.4  NEUTROABS 5.2  --   HGB 12.7 10.9*  HCT 39.1 33.2*  MCV 95.1 92.5  PLT 148* 165    Cardiac Enzymes:  Recent Labs Lab 05/22/15 1719  TROPONINI 0.15*    Lipid Panel: No results for input(s): CHOL, TRIG, HDL, CHOLHDL, VLDL, LDLCALC in the last 168 hours.  CBG:  Recent Labs Lab 05/23/15 1126 05/23/15 1628 05/23/15 2133  05/23/15 2305 05/24/15 0818  GLUCAP 114* 213* 242* 250* 194*    Microbiology: Results for orders placed or performed during the hospital encounter of 05/22/15  MRSA PCR Screening     Status: None   Collection Time: 05/22/15  9:32 PM  Result Value Ref Range Status   MRSA by PCR NEGATIVE NEGATIVE Final    Comment:        The GeneXpert MRSA Assay (FDA approved for NASAL specimens only), is one component of a comprehensive MRSA colonization surveillance program. It is not intended to diagnose MRSA infection nor to guide or monitor treatment for MRSA infections.     Coagulation Studies: No results for input(s): LABPROT, INR in the last 72 hours.  Imaging: Ct Head Wo Contrast  05/23/2015  CLINICAL DATA:  Seizure activity, history of stroke, dementia EXAM: CT HEAD WITHOUT CONTRAST TECHNIQUE: Contiguous axial images were obtained from the base of the skull through the vertex without intravenous contrast. COMPARISON:  MRI brain dated 05/03/2015.  CT head dated 05/02/2015. FINDINGS: No evidence of parenchymal hemorrhage or extra-axial fluid collection. No mass lesion, mass effect, or midline shift. No CT evidence of acute infarction. Encephalomalacic changes related to old left parietal and occipital infarcts. Extensive small vessel ischemic changes. Intracranial atherosclerosis. Global cortical  and central atrophy. Secondary ventriculomegaly. Ex vacuo dilatation of the left lateral ventricle. The visualized paranasal sinuses are essentially clear. The mastoid air cells are unopacified. No evidence of calvarial fracture. IMPRESSION: No evidence of acute intracranial abnormality. 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 Atrophy with extensive small vessel ischemic changes. Secondary ventriculomegaly. Electronically Signed   By: Charline BillsSriyesh  Krishnan M.D.   On: 05/23/2015 18:03   Dg Chest Portable 1 View  05/22/2015  CLINICAL DATA:  Seizure activity today, end-stage renal disease, unresponsive,  history coronary artery disease post MI, stroke, hypertension, hyperlipidemia, diabetes mellitus, chronic systolic CHF EXAM: PORTABLE CHEST 1 VIEW COMPARISON:  Portable exam 1726 hours compared to 05/02/2015 FINDINGS: Enlargement of cardiac silhouette with pulmonary vascular congestion. Hazy perihilar infiltrates suggesting pulmonary edema, question CHF. Minimal central peribronchial thickening. No pleural effusion or pneumothorax. Bones demineralized. Atherosclerotic calcification aorta. IMPRESSION: Enlargement of cardiac silhouette with vascular congestion and subtle perihilar infiltrates favoring pulmonary edema and CHF Electronically Signed   By: Ulyses SouthwardMark  Boles M.D.   On: 05/22/2015 17:59    Medications:  I have reviewed the patient's current medications. Scheduled: . aspirin EC  81 mg Oral Daily  . atorvastatin  80 mg Oral Daily  . calcium acetate  1,334 mg Oral TID WC  . cholecalciferol  2,000 Units Oral Daily  . clopidogrel  75 mg Oral Daily  . docusate sodium  100 mg Oral QHS  . famotidine  20 mg Oral Daily  . feeding supplement (NEPRO CARB STEADY)  237 mL Oral BID BM  . heparin  5,000 Units Subcutaneous 3 times per day  . insulin aspart  0-9 Units Subcutaneous TID AC & HS  . insulin glargine  5 Units Subcutaneous Daily  . iron polysaccharides  150 mg Oral Daily  . lactobacillus acidophilus  1 tablet Oral Daily  . levETIRAcetam  500 mg Oral Q M,W,F-2000  . levETIRAcetam  750 mg Oral Q24H  . sodium chloride flush  3 mL Intravenous Q12H  . sodium polystyrene  30 g Oral Once    Assessment/Plan: Patient with multiple medical problems including dementia and stroke who has now developed seizure.  Started on Keppra.  No further events noted.  Head CT personally reviewed and shows chronic left parietal and occipital infarcts.  With likely precipitators of chronic infarcts and dementia, long term anticonvulsant therapy is indicated.    Recommendations: 1.  EEG to be performed.  May be  performed as an outpatient. 2.  Continue Keppra at current dose.     LOS: 2 days   Thana FarrLeslie Mitsuo Budnick, MD Neurology [phone] 05/24/2015  9:56 AM

## 2015-05-24 NOTE — Progress Notes (Signed)
HD tx ended 

## 2015-05-24 NOTE — NC FL2 (Signed)
[place] MEDICAID FL2 LEVEL OF CARE SCREENING TOOL     IDENTIFICATION  Patient Name: [name] Birthdate: 07/07/1945 Sex: female Admission Date (Current Location): 05/22/2015  [place]   Facility and Address:  [facility], [address], [place], [place] [address]      Provider Number: [id number]  Attending Physician Name and Address:  Katha HammingSnehalatha Konidena, MD  Relative Name and Phone Number:       Current Level of Care: Hospital Recommended Level of Care: Skilled Nursing Facility Prior Approval Number:    Date Approved/Denied:   PASRR Number:  [id number] [id number])  Discharge Plan: SNF    Current Diagnoses: Patient Active Problem List   Diagnosis Date Noted  . Pulmonary edema 05/22/2015  . Hyperkalemia 05/22/2015  . Confusion 05/03/2015  . Aortic valve disease   . Syncope 04/28/2015  . Malnutrition of moderate degree 04/15/2015  . Acute on chronic systolic CHF (congestive heart failure) (HCC) 04/13/2015  . Elevated troponin 04/13/2015  . Acute encephalopathy 04/13/2015  . CAD (coronary artery disease) 04/13/2015  . GERD (gastroesophageal reflux disease) 04/13/2015  . Pressure ulcer 04/13/2015  . Altered mental status   . Aortic valve stenosis, severe   . Cardiomyopathy, ischemic   . Metabolic encephalopathy   . Respiratory failure (HCC)   . Dyspnea   . Severe aortic valve stenosis   . 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 NSTEMI (non-ST elevated myocardial infarction) (HCC)   . Cardiomyopathy, hypertensive, benign   . Angioedema 02/05/2015  . Acute respiratory failure (HCC) 02/05/2015  . Seizures (HCC) 12/02/2014  . Hyperlipidemia 08/11/2014  . End stage renal disease on dialysis (HCC) 05/12/2014  . Chronic systolic CHF (congestive heart failure) (HCC) 05/12/2014  . Secondary hypertension, unspecified 05/12/2014  .  Type 2 diabetes mellitus with other circulatory complications (HCC) 05/12/2014  . Moderate aortic valve stenosis 05/12/2014    Orientation RESPIRATION BLADDER Height & Weight     Self  Normal Incontinent Weight: 141 lb 12.1 oz (64.3 kg) Height:  5\' 6"  (167.6 cm)  BEHAVIORAL SYMPTOMS/MOOD NEUROLOGICAL BOWEL NUTRITION STATUS   (None) Convulsions/Seizures (Seizures) Incontinent Diet (DYS 1)  AMBULATORY STATUS COMMUNICATION OF NEEDS Skin   Extensive Assist Verbally Other (Comment) (Pressure Ulcer Stage II Medial Sacrum, Pressure Ulcer Deep T)                       Personal Care Assistance Level of Assistance  Bathing, Feeding, Dressing Bathing Assistance: Limited assistance Feeding assistance: Limited assistance Dressing Assistance: Limited assistance     Functional Limitations Info  Sight, Hearing, Speech Sight Info: Adequate Hearing Info: Adequate Speech Info: Adequate    SPECIAL CARE FACTORS FREQUENCY  PT (By licensed PT)     PT Frequency:  (5)              Contractures      Additional Factors Info  Code Status, Allergies, Insulin Sliding Scale Code Status Info:  (DNR) Allergies Info:  (Contrast Media )   Insulin Sliding Scale Info: insulin aspart (novoLOG) injection 0-9 Units 0-9 Units, Subcutaneous, 3 times daily before meals & bedtime; insulin glargine (LANTUS) injection 5 Units 5 Units, Subcutaneous, Daily;        Current Medications (05/24/2015):  This is the current hospital active medication list Current Facility-Administered Medications  Medication Dose Route Frequency Provider Last Rate Last Dose  . aspirin EC  tablet 81 mg  81 mg Oral Daily Altamese Dilling, MD   81 mg at 05/24/15 1651  . atorvastatin (LIPITOR) tablet 80 mg  80 mg Oral Daily Altamese Dilling, MD   80 mg at 05/24/15 1651  . bisacodyl (DULCOLAX) suppository 10 mg  10 mg Rectal Daily PRN Altamese Dilling, MD      . calcium acetate (PHOSLO) capsule 1,334 mg  1,334 mg Oral  TID WC Altamese Dilling, MD   1,334 mg at 05/24/15 1652  . cholecalciferol (VITAMIN D) tablet 2,000 Units  2,000 Units Oral Daily Altamese Dilling, MD   2,000 Units at 05/24/15 1652  . clopidogrel (PLAVIX) tablet 75 mg  75 mg Oral Daily Altamese Dilling, MD   75 mg at 05/24/15 1652  . docusate sodium (COLACE) capsule 100 mg  100 mg Oral QHS Altamese Dilling, MD   100 mg at 05/22/15 2200  . famotidine (PEPCID) tablet 20 mg  20 mg Oral Daily Altamese Dilling, MD   20 mg at 05/24/15 1652  . feeding supplement (NEPRO CARB STEADY) liquid 237 mL  237 mL Oral BID BM Altamese Dilling, MD   237 mL at 05/24/15 1047  . heparin injection 5,000 Units  5,000 Units Subcutaneous 3 times per day Altamese Dilling, MD   5,000 Units at 05/24/15 0507  . insulin aspart (novoLOG) injection 0-9 Units  0-9 Units Subcutaneous TID AC & HS Robley Fries, MD   5 Units at 05/24/15 1210  . insulin glargine (LANTUS) injection 5 Units  5 Units Subcutaneous Daily Altamese Dilling, MD   5 Units at 05/24/15 1045  . iron polysaccharides (NIFEREX) capsule 150 mg  150 mg Oral Daily Altamese Dilling, MD   150 mg at 05/24/15 1652  . lactobacillus acidophilus (BACID) tablet 1 tablet  1 tablet Oral Daily Altamese Dilling, MD   1 tablet at 05/24/15 1651  . levETIRAcetam (KEPPRA) tablet 500 mg  500 mg Oral Q M,W,F-2000 Altamese Dilling, MD      . levETIRAcetam (KEPPRA) tablet 750 mg  750 mg Oral Q24H Altamese Dilling, MD   750 mg at 05/23/15 2300  . lidocaine-prilocaine (EMLA) cream 1 application  1 application Topical PRN Altamese Dilling, MD      . sodium chloride flush (NS) 0.9 % injection 3 mL  3 mL Intravenous Q12H Altamese Dilling, MD   3 mL at 05/24/15 1046  . sodium polystyrene (KAYEXALATE) 15 GM/60ML suspension 30 g  30 g Oral Once Altamese Dilling, MD   30 g at 05/22/15 2015     Discharge Medications: Please see discharge summary for a list of discharge  medications.  Relevant Imaging Results:  Relevant Lab Results:   Additional Information  (SSN [id number])  Verta Ellen Rosenda Geffrard, [name]

## 2015-05-24 NOTE — Progress Notes (Signed)
Subjective:  Admitted with hyperkalemia and required emergent hemodialysis on Saturday. Scheduled for dialysis later today.   Objective:  Vital signs in last 24 hours:  Temp:  [97.7 F (36.5 C)-99.1 F (37.3 C)] 97.7 F (36.5 C) (03/06 0838) Pulse Rate:  [78-84] 78 (03/06 0843) Resp:  [16-18] 16 (03/06 0838) BP: (101-119)/(64-69) 115/64 mmHg (03/06 0838) SpO2:  [96 %-100 %] 96 % (03/06 0843)  Weight change:  Filed Weights   05/22/15 2116 05/22/15 2200 05/23/15 0049  Weight: 64.501 kg (142 lb 3.2 oz) 65.4 kg (144 lb 2.9 oz) 64.4 kg (141 lb 15.6 oz)    Intake/Output:    Intake/Output Summary (Last 24 hours) at 05/24/15 1056 Last data filed at 05/24/15 1047  Gross per 24 hour  Intake    490 ml  Output      0 ml  Net    490 ml     Physical Exam: General:  no acute distress, laying in the bed, thin, frail   HEENT  moist mucous membranes   Neck  supple   Pulm/lungs  normal effort, decreased breath sounds at bases   CVS/Heart  irregular   Abdomen:   soft, nontender   Extremities:  trace peripheral edema   Neurologic:  able to follow commands but nonverbal   Skin:  no rashes   Access: Left upper extremity AVG       Basic Metabolic Panel:   Recent Labs Lab 05/22/15 1719 05/22/15 2230 05/23/15 0639  NA 131* 135  --   K 6.4* 3.4*  --   CL 91* 95*  --   CO2 22 31  --   GLUCOSE 110* 112*  --   BUN 43* 31*  --   CREATININE 4.27* 3.02*  --   CALCIUM 9.5 8.9  --   MG  --   --  2.1     CBC:  Recent Labs Lab 05/22/15 1719 05/22/15 2230  WBC 6.5 5.4  NEUTROABS 5.2  --   HGB 12.7 10.9*  HCT 39.1 33.2*  MCV 95.1 92.5  PLT 148* 165      Microbiology:  Recent Results (from the past 720 hour(s))  MRSA PCR Screening     Status: None   Collection Time: 05/22/15  9:32 PM  Result Value Ref Range Status   MRSA by PCR NEGATIVE NEGATIVE Final    Comment:        The GeneXpert MRSA Assay (FDA approved for NASAL specimens only), is one component of  a comprehensive MRSA colonization surveillance program. It is not intended to diagnose MRSA infection nor to guide or monitor treatment for MRSA infections.     Coagulation Studies: No results for input(s): LABPROT, INR in the last 72 hours.  Urinalysis: No results for input(s): COLORURINE, LABSPEC, PHURINE, GLUCOSEU, HGBUR, BILIRUBINUR, KETONESUR, PROTEINUR, UROBILINOGEN, NITRITE, LEUKOCYTESUR in the last 72 hours.  Invalid input(s): APPERANCEUR    Imaging: Ct Head Wo Contrast  05/23/2015  CLINICAL DATA:  Seizure activity, history of stroke, dementia EXAM: CT HEAD WITHOUT CONTRAST TECHNIQUE: Contiguous axial images were obtained from the base of the skull through the vertex without intravenous contrast. COMPARISON:  MRI brain dated 05/03/2015.  CT head dated 05/02/2015. FINDINGS: No evidence of parenchymal hemorrhage or extra-axial fluid collection. No mass lesion, mass effect, or midline shift. No CT evidence of acute infarction. Encephalomalacic changes related to old left parietal and occipital infarcts. Extensive small vessel ischemic changes. Intracranial atherosclerosis. Global cortical and central atrophy. Secondary ventriculomegaly. Ex  vacuo dilatation of the left lateral ventricle. The visualized paranasal sinuses are essentially clear. The mastoid air cells are unopacified. No evidence of calvarial fracture. IMPRESSION: No evidence of acute intracranial abnormality. 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 Atrophy with extensive small vessel ischemic changes. Secondary ventriculomegaly. Electronically Signed   By: Charline BillsSriyesh  Krishnan M.D.   On: 05/23/2015 18:03   Dg Chest Portable 1 View  05/22/2015  CLINICAL DATA:  Seizure activity today, end-stage renal disease, unresponsive, history coronary artery disease post MI, stroke, hypertension, hyperlipidemia, diabetes mellitus, chronic systolic CHF EXAM: PORTABLE CHEST 1 VIEW COMPARISON:  Portable exam 1726 hours compared to 05/02/2015  FINDINGS: Enlargement of cardiac silhouette with pulmonary vascular congestion. Hazy perihilar infiltrates suggesting pulmonary edema, question CHF. Minimal central peribronchial thickening. No pleural effusion or pneumothorax. Bones demineralized. Atherosclerotic calcification aorta. IMPRESSION: Enlargement of cardiac silhouette with vascular congestion and subtle perihilar infiltrates favoring pulmonary edema and CHF Electronically Signed   By: Ulyses SouthwardMark  Boles M.D.   On: 05/22/2015 17:59     Medications:     . aspirin EC  81 mg Oral Daily  . atorvastatin  80 mg Oral Daily  . calcium acetate  1,334 mg Oral TID WC  . cholecalciferol  2,000 Units Oral Daily  . clopidogrel  75 mg Oral Daily  . docusate sodium  100 mg Oral QHS  . famotidine  20 mg Oral Daily  . feeding supplement (NEPRO CARB STEADY)  237 mL Oral BID BM  . heparin  5,000 Units Subcutaneous 3 times per day  . insulin aspart  0-9 Units Subcutaneous TID AC & HS  . insulin glargine  5 Units Subcutaneous Daily  . iron polysaccharides  150 mg Oral Daily  . lactobacillus acidophilus  1 tablet Oral Daily  . levETIRAcetam  500 mg Oral Q M,W,F-2000  . levETIRAcetam  750 mg Oral Q24H  . sodium chloride flush  3 mL Intravenous Q12H  . sodium polystyrene  30 g Oral Once   bisacodyl, lidocaine-prilocaine  Assessment/ Plan:  70 y.o. female with past medical history of end-stage renal disease, myocardial infarction, CVA, hyperlipidemia, hypertension, diabetes mellitus, depression, secondary hyperparathyroidism, dementia, GERD, anemia chronic kidney disease, History of angioedema due to oxygen tubing.   [facility] [address]   1. End-stage renal disease with hyperkalemia:  Continue MWF schedule.  Urgent treatment on Saturday for hyperkalemia. Next dialysis later for today.   2. Anemia chronic kidney disease. Hemoglobin 10.9 - holding Procrit due to concern of seizures  3. Secondary hyperparathyroidism: phosphorus at  goal from 2/15: 4.6 - Calcium acetate  4. Hypertension: blood pressure at goal.   5. Diabetes mellitus type II with chronic kidney disease: insulin dependent.  - Continue glucose control.    LOS: 2 Keshawn Fiorito 3/6/201710:56 AM

## 2015-05-24 NOTE — Care Management Important Message (Signed)
Important Message  Patient Details  Name: [name] MRN: [id number] Date of Birth: 10/02/1945   Medicare Important Message Given:  Yes    Olegario MessierKathy A Tomas Schamp 05/24/2015, 10:24 AM

## 2015-05-24 NOTE — Clinical Social Work Note (Signed)
Clinical Social Work Assessment  Patient Details  Name: [name] MRN: [id number] Date of Birth: [birth year]  Date of referral:  05/24/15               Reason for consult:  Discharge Planning                Permission sought to share information with:  Psychiatrist Permission granted to share information::     Name::        Agency::     Relationship::   Charlene Applebaum [phone])  Contact Information:     Housing/Transportation Living arrangements for the past 2 months:  [place] of Information:  Adult Children Charlene [phone]) Patient Interpreter Needed:  None Criminal Activity/Legal Involvement Pertinent to Current Situation/Hospitalization:  No - Comment as needed Significant Relationships:  Adult Children [name]- [phone]) Lives with:  Adult Children Do you feel safe going back to the place where you live?  Yes Need for family participation in patient care:  Yes (Comment) Charlene [phone])  Care giving concerns:  Patient is from [facility].     Social Worker assessment / plan:  CSW is familiar with patient. CSW met with patient's legal guardian/ Daughter [name]- [phone] at bedside. Patient was not in room at the time. Per patient's daughter she is "unsure" of patient's discharge plans. She reports that [facility] was going to discharge patient on Tuesday May 25, 2015 home. Verbal permission granted to contact [facility] and discuss discharge plans. Per Charlene Applebaum she's "debating" about hospice services for patient and discontinuing dialysis. She reports "it's hard... I don't know what to do". She reports that she attempted to do LTC Medicaid for patient but can only retrieve 3 years of patient's financials and DSS is requesting 5 years. She reports that the state of [place] is not respecting her [place] guardianship. She reports that she's possibly interested in [place] or patient going home at  discharge. Per Charlene Applebaum she isn't ready to make choices about discharge because she's unsure of "where to go from here".   CSW is requesting a Palliative Consult for patient.   CSW attempted to contact [name]- admissions coordinator at [facility]. Left a voicemail. Awaiting call back.  FL2 completed and faxed to [facility].   CSW will continue to follow and assist.   Employment status:  Retired Forensic scientist:  Medicare PT Recommendations:  Magazine / Referral to community resources:     Patient/Family's Response to care:  Patient's daughter is unsure about discharge plans at this time. Reports patient ca return to [facility] at discharge.   Patient/Family's Understanding of and Emotional Response to Diagnosis, Current Treatment, and Prognosis:  [name] CSW's role and is appreciative of her assistance.   Emotional Assessment Appearance:  Appears stated age Attitude/Demeanor/Rapport:   (None) Affect (typically observed):  Unable to Assess Orientation:  Oriented to Self Alcohol / Substance use:  Not Applicable Psych involvement (Current and /or in the community):  No (Comment)  Discharge Needs  Concerns to be addressed:  Discharge Planning Concerns Readmission within the last 30 days:  No Current discharge risk:  Chronically ill Barriers to Discharge:  Continued Medical Work up   [employer], [name] 05/24/2015, 4:41 PM

## 2015-05-24 NOTE — Progress Notes (Signed)
Pre-hd tx 

## 2015-05-25 LAB — GLUCOSE, CAPILLARY
GLUCOSE-CAPILLARY: 190 mg/dL — AB (ref 65–99)
GLUCOSE-CAPILLARY: 238 mg/dL — AB (ref 65–99)
GLUCOSE-CAPILLARY: 223 mg/dL — AB (ref 65–99)
Glucose-Capillary: 241 mg/dL — ABNORMAL HIGH (ref 65–99)

## 2015-05-25 MED ORDER — INSULIN GLARGINE 100 UNIT/ML ~~LOC~~ SOLN
8.0000 [IU] | Freq: Every day | SUBCUTANEOUS | Status: DC
  Filled 2015-05-25 (×2): qty 0.08

## 2015-05-25 NOTE — Progress Notes (Signed)
Subjective:   Hemodialysis yesterday. Tolerated treatment well. UF of 568mL.   Objective:  Vital signs in last 24 hours:  Temp:  [97.4 F (36.3 C)-98.6 F (37 C)] 97.4 F (36.3 C) (03/07 0528) Pulse Rate:  [79-96] 88 (03/07 0528) Resp:  [10-22] 22 (03/07 0528) BP: (89-125)/(60-73) 123/69 mmHg (03/07 0528) SpO2:  [91 %-100 %] 100 % (03/07 0528) Weight:  [64.3 kg (141 lb 12.1 oz)-65 kg (143 lb 4.8 oz)] 64.3 kg (141 lb 12.1 oz) (03/06 1452)  Weight change:  Filed Weights   05/23/15 0049 05/24/15 1225 05/24/15 1452  Weight: 64.4 kg (141 lb 15.6 oz) 65 kg (143 lb 4.8 oz) 64.3 kg (141 lb 12.1 oz)    Intake/Output:    Intake/Output Summary (Last 24 hours) at 05/25/15 1215 Last data filed at 05/25/15 1200  Gross per 24 hour  Intake    600 ml  Output    569 ml  Net     31 ml     Physical Exam: General:  no acute distress,sitting up in bed, thin, frail   HEENT  moist mucous membranes   Neck  supple   Pulm/lungs  normal effort, clear  CVS/Heart  irregular   Abdomen:   soft, nontender   Extremities:  no peripheral edema   Neurologic:  able to follow commands but nonverbal   Skin:  no rashes   Access: Left upper extremity AVG       Basic Metabolic Panel:   Recent Labs Lab 05/22/15 1719 05/22/15 2230 05/23/15 0639 05/24/15 1403  NA 131* 135  --  134*  K 6.4* 3.4*  --  5.0  CL 91* 95*  --  94*  CO2 22 31  --  28  GLUCOSE 110* 112*  --  338*  BUN 43* 31*  --  50*  CREATININE 4.27* 3.02*  --  4.76*  CALCIUM 9.5 8.9  --  9.3  MG  --   --  2.1  --   PHOS  --   --   --  2.1*     CBC:  Recent Labs Lab 05/22/15 1719 05/22/15 2230 05/24/15 1403  WBC 6.5 5.4 5.6  NEUTROABS 5.2  --  4.3  HGB 12.7 10.9* 11.0*  HCT 39.1 33.2* 34.7*  MCV 95.1 92.5 91.9  PLT 148* 165 188      Microbiology:  Recent Results (from the past 720 hour(s))  MRSA PCR Screening     Status: None   Collection Time: 05/22/15  9:32 PM  Result Value Ref Range Status   MRSA by PCR  NEGATIVE NEGATIVE Final    Comment:        The GeneXpert MRSA Assay (FDA approved for NASAL specimens only), is one component of a comprehensive MRSA colonization surveillance program. It is not intended to diagnose MRSA infection nor to guide or monitor treatment for MRSA infections.     Coagulation Studies: No results for input(s): LABPROT, INR in the last 72 hours.  Urinalysis: No results for input(s): COLORURINE, LABSPEC, PHURINE, GLUCOSEU, HGBUR, BILIRUBINUR, KETONESUR, PROTEINUR, UROBILINOGEN, NITRITE, LEUKOCYTESUR in the last 72 hours.  Invalid input(s): APPERANCEUR    Imaging: Ct Head Wo Contrast  05/23/2015  CLINICAL DATA:  Seizure activity, history of stroke, dementia EXAM: CT HEAD WITHOUT CONTRAST TECHNIQUE: Contiguous axial images were obtained from the base of the skull through the vertex without intravenous contrast. COMPARISON:  MRI brain dated 05/03/2015.  CT head dated 05/02/2015. FINDINGS: No evidence of parenchymal hemorrhage or  extra-axial fluid collection. No mass lesion, mass effect, or midline shift. No CT evidence of acute infarction. Encephalomalacic changes related to old left parietal and occipital infarcts. Extensive small vessel ischemic changes. Intracranial atherosclerosis. Global cortical and central atrophy. Secondary ventriculomegaly. Ex vacuo dilatation of the left lateral ventricle. The visualized paranasal sinuses are essentially clear. The mastoid air cells are unopacified. No evidence of calvarial fracture. IMPRESSION: No evidence of acute intracranial abnormality. 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 Atrophy with extensive small vessel ischemic changes. Secondary ventriculomegaly. Electronically Signed   By: Charline Bills M.D.   On: 05/23/2015 18:03     Medications:     . aspirin EC  81 mg Oral Daily  . atorvastatin  80 mg Oral Daily  . calcium acetate  1,334 mg Oral TID WC  . cholecalciferol  2,000 Units Oral Daily  . clopidogrel   75 mg Oral Daily  . docusate sodium  100 mg Oral QHS  . famotidine  20 mg Oral Daily  . feeding supplement (NEPRO CARB STEADY)  237 mL Oral BID BM  . heparin  5,000 Units Subcutaneous 3 times per day  . insulin aspart  0-9 Units Subcutaneous TID AC & HS  . insulin glargine  5 Units Subcutaneous Daily  . iron polysaccharides  150 mg Oral Daily  . lactobacillus acidophilus  1 tablet Oral Daily  . levETIRAcetam  500 mg Oral Q M,W,F-2000  . levETIRAcetam  750 mg Oral Q24H  . sodium chloride flush  3 mL Intravenous Q12H  . sodium polystyrene  30 g Oral Once   bisacodyl, lidocaine-prilocaine  Assessment/ Plan:  70 y.o. female with past medical history of end-stage renal disease, myocardial infarction, CVA, hyperlipidemia, hypertension, diabetes mellitus, depression, secondary hyperparathyroidism, dementia, GERD, anemia chronic kidney disease, History of angioedema due to oxygen tubing.   [facility] Nephrology MWF [facility] [address]   1. End-stage renal disease with hyperkalemia:  Continue MWF schedule.  Urgent treatment on Saturday for hyperkalemia. Next dialysis later for tomorrow.   2. Anemia chronic kidney disease. Hemoglobin 11 - holding Procrit due to concern of seizures  3. Secondary hyperparathyroidism: phosphorus 2.1 - Calcium acetate  4. Hypertension: blood pressure at goal.   5. Diabetes mellitus type II with chronic kidney disease: insulin dependent.  - Continue glucose control.    LOS: 3 [name] 3/7/201712:15 PM

## 2015-05-25 NOTE — Progress Notes (Signed)
[facility] - [place] at [facility]   PATIENT NAME: [name]    MR#:  [id number]  DATE OF BIRTH:  March 26, 1945  Seen at bedside.demented,non verbal  At baseline, waiting for  EEG results.    CHIEF COMPLAINT:   Chief Complaint  Patient presents with  . Respiratory Distress    REVIEW OF SYSTEMS:    Review of Systems  Unable to perform ROS: dementia    Nutrition:  Tolerating Diet: No Tolerating PT:      DRUG ALLERGIES:   Allergies  Allergen Reactions  . Contrast Media [Iodinated Diagnostic Agents] Anaphylaxis    VITALS:  Blood pressure 123/69, pulse 88, temperature 97.4 F (36.3 C), temperature source Oral, resp. rate 22, height  (1.676 m), weight 64.3 kg (141 lb 12.1 oz), SpO2 100 %.  PHYSICAL EXAMINATION:   Physical Exam  GENERAL:  70 y.o.-year-old patient lying in the bed with no acute distress.  EYES: Pupils equal, round, reactive to light and accommodation. No scleral icterus. Extraocular muscles intact.  HEENT: Head atraumatic, normocephalic. Oropharynx and nasopharynx clear.  NECK:  Supple, no jugular venous distention. No thyroid enlargement, no tenderness.  LUNGS: Normal breath sounds bilaterally, no wheezing, rales,rhonchi or crepitation. No use of accessory muscles of respiration.  CARDIOVASCULAR: S1, S2 normal. No murmurs, rubs, or gallops.  ABDOMEN: Soft, nontender, nondistended. Bowel sounds present. No organomegaly or mass.  EXTREMITIES: No pedal edema, cyanosis, or clubbing.  NEUROLOGIC: Unable to do full neurological exam because of her dementia. PSYCHIATRIC: Patient is slightly lethargic.  SKIN: No obvious rash, lesion, or ulcer.    LABORATORY PANEL:   CBC  Recent Labs Lab 05/24/15 1403  WBC 5.6  HGB 11.0*  HCT 34.7*  PLT 188   ------------------------------------------------------------------------------------------------------------------  Chemistries   Recent Labs Lab 05/23/15 0639  05/24/15 1403  NA  --  134*  K  --  5.0  CL  --  94*  CO2  --  28  GLUCOSE  --  338*  BUN  --  50*  CREATININE  --  4.76*  CALCIUM  --  9.3  MG 2.1  --    ------------------------------------------------------------------------------------------------------------------  Cardiac Enzymes  Recent Labs Lab 05/22/15 1719  TROPONINI 0.15*   ------------------------------------------------------------------------------------------------------------------  RADIOLOGY:  Ct Head Wo Contrast  05/23/2015  CLINICAL DATA:  Seizure activity, history of stroke, dementia EXAM: CT HEAD WITHOUT CONTRAST TECHNIQUE: Contiguous axial images were obtained from the base of the skull through the vertex without intravenous contrast. COMPARISON:  MRI brain dated 05/03/2015.  CT head dated 05/02/2015. FINDINGS: No evidence of parenchymal hemorrhage or extra-axial fluid collection. No mass lesion, mass effect, or midline shift. No CT evidence of acute infarction. Encephalomalacic changes related to old left parietal and occipital infarcts. Extensive small vessel ischemic changes. Intracranial atherosclerosis. Global cortical and central atrophy. Secondary ventriculomegaly. Ex vacuo dilatation of the left lateral ventricle. The visualized paranasal sinuses are essentially clear. The mastoid air cells are unopacified. No evidence of calvarial fracture. IMPRESSION: No evidence of acute intracranial abnormality. 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 Atrophy with extensive small vessel ischemic changes. Secondary ventriculomegaly. Electronically Signed   By: Charline Bills M.D.   On: 05/23/2015 18:03     ASSESSMENT AND PLAN:   Principal Problem:   Pulmonary edema Active Problems:   Seizures (HCC)   Hyperkalemia   #1 acute respiratory failure secondary to pulmonary edema in the contest of ESRD: Hemodialysis today. Received emergency hemodialysis on admission because of hyperkalemia and  respiratory  distress.  #2 hyperkalemia with EKG changes and also received hemodialysis  Had NSVT  at this morning. Received calcium gluconate, dextrose, insulin. Potassium decreased from 6.5-3.4. #3 history of coronary artery disease: Continue Plavix. #4 dementia #6 diabetes mellitus type 2: continue SSI with coverage #7 possible seizure at NH<CT head showed old infarcts,  And atrophy.EEG result pending.continue Keppra,.  . 8  hypertension ;controlled.  DNR  All the records are reviewed and case discussed with Care Management/Social Workerr. Management plans discussed with the patient, family and they are in agreement.  CODE STATUS: DNR TOTAL TIME TAKING CARE OF THIS PATIENT: 20 minutes.   POSSIBLE D/C IN 3-4DAYS, DEPENDING ON CLINICAL CONDITION.   Katha HammingKONIDENA,Aesha Agrawal M.D on 05/25/2015 at 12:49 PM  Between 7am to 6pm - Pager - [phone]  After 6pm go to [email or web address] - password [email or web address] [facility]  [facility]  Office  [phone]  CC: Primary care physician; Amy Diana EvesL Krebs, NP

## 2015-05-25 NOTE — Procedures (Signed)
ELECTROENCEPHALOGRAM REPORT   Patient: [name]       Room #: 207A-AA EEG No. ID: [id number] Age: 70 y.o.        Sex: female Referring Physician: Luberta MutterKonidena Report Date:  05/25/2015        Interpreting Physician: Thana FarrEYNOLDS, Eliany Mccarter  History: [name] is an 70 y.o. female with a history of stroke and dementia who presents with new onset seizure activity  Medications:  Scheduled: . aspirin EC  81 mg Oral Daily  . atorvastatin  80 mg Oral Daily  . calcium acetate  1,334 mg Oral TID WC  . cholecalciferol  2,000 Units Oral Daily  . clopidogrel  75 mg Oral Daily  . docusate sodium  100 mg Oral QHS  . famotidine  20 mg Oral Daily  . feeding supplement (NEPRO CARB STEADY)  237 mL Oral BID BM  . heparin  5,000 Units Subcutaneous 3 times per day  . insulin aspart  0-9 Units Subcutaneous TID AC & HS  . [START ON 05/26/2015] insulin glargine  8 Units Subcutaneous Daily  . iron polysaccharides  150 mg Oral Daily  . lactobacillus acidophilus  1 tablet Oral Daily  . levETIRAcetam  500 mg Oral Q M,W,F-2000  . levETIRAcetam  750 mg Oral Q24H  . sodium chloride flush  3 mL Intravenous Q12H  . sodium polystyrene  30 g Oral Once    Conditions of Recording:  This is a 16 channel EEG carried out with the patient in the asleep state.  Description:  Despite the fact that the patient has her eyes open at times and closed at other times the background activity does not change.  The background activity is slow and of low voltage consisting of a mixture of delta and theta activity with delta activity being most prominent.  There are superimposed intermittent periods of rhythmic beta activity resembling sleep spindles that are most prominent in the central regions.  There are also occasional transients that appear to be poorly formed vertex central sleep transients.   No epileptiform activity is noted.   Hyperventilation and intermittent photic stimulation were not performed.  IMPRESSION: This is what  appears to be a normal asleep electroencephalogram.  No epileptiform activity is noted.     Thana FarrLeslie Emanii Bugbee, MD Neurology [phone] 05/25/2015, 2:59 PM

## 2015-05-25 NOTE — Progress Notes (Signed)
EEG completed, results pending. 

## 2015-05-25 NOTE — Progress Notes (Signed)
Initial Nutrition Assessment     INTERVENTION:  Meals and snacks: cater to pt preferences. Pt may benefit from renal diet pending intake and renal labs. Medical Nutrition Supplement Therapy: Agree with nepro BID for added nutrition   NUTRITION DIAGNOSIS:   Increased nutrient needs related to wound healing, chronic illness as evidenced by estimated needs.    GOAL:   Patient will meet greater than or equal to 90% of their needs    MONITOR:    (Energy intake, Electrolyte and renal profile)  REASON FOR ASSESSMENT:    (dialysis pt and pressure ulcer )    ASSESSMENT:      Pt admitted with pulmonary edema, hyperkalemia (HD performed yesterday). Pt out of room during rounds this am. No family present  Past Medical History  Diagnosis Date  . Stroke (HCC)   . MI (myocardial infarction) (HCC)   . Hearing impaired   . Hyperlipidemia   . Hypertension   . Diabetes mellitus without complication (HCC)   . Depression   . Coronary artery disease   . Dementia   . Secondary hyperparathyroidism (HCC)   . Chronic systolic CHF (congestive heart failure) (HCC)   . GERD (gastroesophageal reflux disease)   . Personal history of transient ischemic attack (TIA) and cerebral infarction without residual deficit   . Anemia   . Moderate aortic stenosis     a. echo 04/2014: 40-50%, moderate AS b. echo 01/2015: EF 35-40%, Severe AS, mild MR, moderate TR  . ESRD (end stage renal disease) (HCC)     a. Dialysis MWF    Current Nutrition: Noted per I and O sheet intake 50-85% meals consumed   Gastrointestinal Profile: Last BM: 3/7   Scheduled Medications:  . aspirin EC  81 mg Oral Daily  . atorvastatin  80 mg Oral Daily  . calcium acetate  1,334 mg Oral TID WC  . cholecalciferol  2,000 Units Oral Daily  . clopidogrel  75 mg Oral Daily  . docusate sodium  100 mg Oral QHS  . famotidine  20 mg Oral Daily  . feeding supplement (NEPRO CARB STEADY)  237 mL Oral BID BM  . heparin  5,000  Units Subcutaneous 3 times per day  . insulin aspart  0-9 Units Subcutaneous TID AC & HS  . insulin glargine  5 Units Subcutaneous Daily  . iron polysaccharides  150 mg Oral Daily  . lactobacillus acidophilus  1 tablet Oral Daily  . levETIRAcetam  500 mg Oral Q M,W,F-2000  . levETIRAcetam  750 mg Oral Q24H  . sodium chloride flush  3 mL Intravenous Q12H  . sodium polystyrene  30 g Oral Once       Electrolyte/Renal Profile and Glucose Profile:   Recent Labs Lab 05/22/15 1719 05/22/15 2230 05/23/15 0639 05/24/15 1403  NA 131* 135  --  134*  K 6.4* 3.4*  --  5.0  CL 91* 95*  --  94*  CO2 22 31  --  28  BUN 43* 31*  --  50*  CREATININE 4.27* 3.02*  --  4.76*  CALCIUM 9.5 8.9  --  9.3  MG  --   --  2.1  --   PHOS  --   --   --  2.1*  GLUCOSE 110* 112*  --  338*   Protein Profile:  Recent Labs Lab 05/24/15 1403  ALBUMIN 2.7*      Weight Trend since Admission: Filed Weights   05/23/15 0049 05/24/15 1225 05/24/15 1452  Weight: 141 lb 15.6 oz (64.4 kg) 143 lb 4.8 oz (65 kg) 141 lb 12.1 oz (64.3 kg)      Diet Order:  DIET - DYS 1 Room service appropriate?: Yes; Fluid consistency:: Thin  Skin: pressure ulcer stage I, II and unstageable noted   Height:   Ht Readings from Last 1 Encounters:  05/22/15 5\' 6"  (1.676 m)    Weight:   Wt Readings from Last 1 Encounters:  05/24/15 141 lb 12.1 oz (64.3 kg)    Ideal Body Weight:     BMI:  Body mass index is 22.89 kg/(m^2).  Estimated Nutritional Needs:   Kcal:  BEE 1181 kcals (IF 1.0-1.2, AF 1.2) 1417-1700 kcals/d  Protein:  (1.2-1.5 g/kg) 77-96 g/d  Fluid:  (1000ml + UOP)  EDUCATION NEEDS:   No education needs identified at this time  MODERATE Care Level  Lolly Glaus B. Freida BusmanAllen, RD, LDN [phone] (pager) Weekend/On-Call pager [phone])

## 2015-05-25 NOTE — Progress Notes (Signed)
CSW contacted [employer]or[employer]- [facility] Per [name] she is not able to take patient back because "we aren't able to meet her needs". CSW received a phone call from patient's daughter/ Legal Guardian- [name]  discussing discharge plans. Per Charlene AustriaEleanor "I assumed they weren't going to take mama back.... I guess she'll have to come home." CSW discussed completing Medicaid application and patient's daughter discussed the "hoops" she has to "jump through" with the state of [place] and stated that she does not foresee getting the finical information needed in time. CSW discussed private pay for SNF ($8,000- 7,000 per month with the 1st month's payment due on day #1). Patient's daughter reports that her family cannot pay for that fee. In agreement to take patient home. CSW informed patient's daughter that patient has to be able to sit up in a chair to receive outpatient dialysis. Patient's daughter reports that she understands. Requested a hospital bed at discharge. CSW informed RN Case Manager of above. Contacted Charlene BraunKaren- [facility] to inform her of above. CSW will continue to follow and assist.   [name], MSW, [name]-A Clinical Social Work Department [phone]

## 2015-05-26 LAB — GLUCOSE, CAPILLARY
GLUCOSE-CAPILLARY: 98 mg/dL (ref 65–99)
Glucose-Capillary: 177 mg/dL — ABNORMAL HIGH (ref 65–99)
Glucose-Capillary: 135 mg/dL — ABNORMAL HIGH (ref 65–99)
Glucose-Capillary: 120 mg/dL — ABNORMAL HIGH (ref 65–99)

## 2015-05-26 MED ORDER — LORAZEPAM 2 MG/ML IJ SOLN
2.0000 mg | Freq: Once | INTRAMUSCULAR | Status: AC
  Administered 2015-05-26: 2 mg via INTRAVENOUS

## 2015-05-26 MED ORDER — LORAZEPAM 2 MG/ML IJ SOLN
INTRAMUSCULAR | Status: AC
  Administered 2015-05-26: 07:00:00
  Filled 2015-05-26: qty 1

## 2015-05-26 MED ORDER — LEVETIRACETAM 500 MG PO TABS
1000.0000 mg | ORAL_TABLET | ORAL | Status: DC
  Administered 2015-05-27 – 2015-05-29 (×3): 1000 mg via ORAL
  Filled 2015-05-26 (×3): qty 2

## 2015-05-26 MED ORDER — LORAZEPAM 2 MG/ML IJ SOLN: 1.0000 mg | INTRAMUSCULAR | Status: DC | PRN

## 2015-05-26 MED ORDER — SODIUM CHLORIDE 0.9 % IV SOLN
500.0000 mg | Freq: Two times a day (BID) | INTRAVENOUS | Status: DC
  Administered 2015-05-26 – 2015-05-28 (×6): 500 mg via INTRAVENOUS
  Filled 2015-05-26 (×8): qty 5

## 2015-05-26 MED ORDER — RISAQUAD PO CAPS
1.0000 | ORAL_CAPSULE | Freq: Every day | ORAL | Status: DC
  Administered 2015-05-27 – 2015-05-29 (×3): 1 via ORAL
  Filled 2015-05-26 (×3): qty 1

## 2015-05-26 NOTE — Progress Notes (Signed)
Patient off the floor to dualysis

## 2015-05-26 NOTE — Progress Notes (Signed)
Subjective:   Scheduled for hemodialysis later today. Not very responsive today  Objective:  Vital signs in last 24 hours:  Temp:  [97.5 F (36.4 C)-98.3 F (36.8 C)] 98.3 F (36.8 C) (03/07 2024) Pulse Rate:  [87-97] 90 (03/08 0847) Resp:  [20] 20 (03/07 2024) BP: (110-123)/(64-79) 111/71 mmHg (03/08 0847) SpO2:  [82 %-100 %] 100 % (03/08 0847) FiO2 (%):  [28 %] 28 % (03/08 0730)  Weight change:  Filed Weights   05/23/15 0049 05/24/15 1225 05/24/15 1452  Weight: 64.4 kg (141 lb 15.6 oz) 65 kg (143 lb 4.8 oz) 64.3 kg (141 lb 12.1 oz)    Intake/Output:    Intake/Output Summary (Last 24 hours) at 05/26/15 1155 Last data filed at 05/26/15 0900  Gross per 24 hour  Intake    660 ml  Output      0 ml  Net    660 ml     Physical Exam: General:  no acute distress,sitting up in bed, thin, frail   HEENT  moist mucous membranes   Neck  supple   Pulm/lungs  normal effort, clear  CVS/Heart  irregular   Abdomen:   soft, nontender   Extremities:  no peripheral edema   Neurologic:  able to follow commands but nonverbal   Skin:  no rashes   Access: Left upper extremity AVG       Basic Metabolic Panel:   Recent Labs Lab 05/22/15 1719 05/22/15 2230 05/23/15 0639 05/24/15 1403  NA 131* 135  --  134*  K 6.4* 3.4*  --  5.0  CL 91* 95*  --  94*  CO2 22 31  --  28  GLUCOSE 110* 112*  --  338*  BUN 43* 31*  --  50*  CREATININE 4.27* 3.02*  --  4.76*  CALCIUM 9.5 8.9  --  9.3  MG  --   --  2.1  --   PHOS  --   --   --  2.1*     CBC:  Recent Labs Lab 05/22/15 1719 05/22/15 2230 05/24/15 1403  WBC 6.5 5.4 5.6  NEUTROABS 5.2  --  4.3  HGB 12.7 10.9* 11.0*  HCT 39.1 33.2* 34.7*  MCV 95.1 92.5 91.9  PLT 148* 165 188      Microbiology:  Recent Results (from the past 720 hour(s))  MRSA PCR Screening     Status: None   Collection Time: 05/22/15  9:32 PM  Result Value Ref Range Status   MRSA by PCR NEGATIVE NEGATIVE Final    Comment:        The GeneXpert  MRSA Assay (FDA approved for NASAL specimens only), is one component of a comprehensive MRSA colonization surveillance program. It is not intended to diagnose MRSA infection nor to guide or monitor treatment for MRSA infections.     Coagulation Studies: No results for input(s): LABPROT, INR in the last 72 hours.  Urinalysis: No results for input(s): COLORURINE, LABSPEC, PHURINE, GLUCOSEU, HGBUR, BILIRUBINUR, KETONESUR, PROTEINUR, UROBILINOGEN, NITRITE, LEUKOCYTESUR in the last 72 hours.  Invalid input(s): APPERANCEUR    Imaging: No results found.   Medications:     . acidophilus  1 capsule Oral Daily  . aspirin EC  81 mg Oral Daily  . atorvastatin  80 mg Oral Daily  . calcium acetate  1,334 mg Oral TID WC  . cholecalciferol  2,000 Units Oral Daily  . clopidogrel  75 mg Oral Daily  . docusate sodium  100 mg Oral  QHS  . famotidine  20 mg Oral Daily  . feeding supplement (NEPRO CARB STEADY)  237 mL Oral BID BM  . heparin  5,000 Units Subcutaneous 3 times per day  . insulin aspart  0-9 Units Subcutaneous TID AC & HS  . insulin glargine  8 Units Subcutaneous Daily  . iron polysaccharides  150 mg Oral Daily  . levETIRAcetam  500 mg Intravenous Q12H  . levETIRAcetam  500 mg Oral Q M,W,F-2000  . levETIRAcetam  750 mg Oral Q24H  . sodium chloride flush  3 mL Intravenous Q12H   bisacodyl, lidocaine-prilocaine  Assessment/ Plan:  70 y.o. female with past medical history of end-stage renal disease, myocardial infarction, CVA, hyperlipidemia, hypertension, diabetes mellitus, depression, secondary hyperparathyroidism, dementia, GERD, anemia chronic kidney disease, History of angioedema due to oxygen tubing.   [facility]phrology MWF [facility] [address]   1. End-stage renal disease with hyperkalemia: potassium 5.  Continue MWF schedule.  Urgent treatment on Saturday for hyperkalemia. Next dialysis for later today.   2. Anemia chronic kidney disease. Hemoglobin 11 - holding  Procrit due to concern of seizures  3. Secondary hyperparathyroidism: phosphorus 2.1 - Calcium acetate  4. Hypertension: blood pressure at goal.   5. Diabetes mellitus type II with chronic kidney disease: insulin dependent.  - Continue glucose control.    LOS: 4 Traves Majchrzak 3/8/201711:55 AM

## 2015-05-26 NOTE — Progress Notes (Signed)
Patient found having seizure like activity, arms bent up towards chest and stiff, shaking hard, eyes closed for about 30 seconds then stop briefly and started shaking again for about 20-30 seconds. Dr. Luberta MutterKonidena notified received order for Ativan 2 mg IV. Ativan given slow push as ordered. Rapid response called 12 lead EKG done Nurse Supervisor at the bedside. Patient post postictal. Blood sugar 177, Vital signs taken 123/79, HR 89, 97 on room air, Dr. Luberta MutterKonidena arrived at the bedside. Order received and taken out as ordered.

## 2015-05-26 NOTE — Progress Notes (Addendum)
Subjective: Patient with multiple seizures this morning.  Now s/p Ativan and lethargic.    Objective: Current vital signs: BP 99/63 mmHg  Pulse 81  Temp(Src) 97.5 F (36.4 C) (Oral)  Resp 17  Ht 5\' 6"  (1.676 m)  Wt 64.3 kg (141 lb 12.1 oz)  BMI 22.89 kg/m2  SpO2 100% Vital signs in last 24 hours: Temp:  [97.5 F (36.4 C)-98.3 F (36.8 C)] 97.5 F (36.4 C) (03/08 1237) Pulse Rate:  [81-97] 81 (03/08 1237) Resp:  [17-20] 17 (03/08 1237) BP: (99-123)/(63-79) 99/63 mmHg (03/08 1237) SpO2:  [82 %-100 %] 100 % (03/08 1237) FiO2 (%):  [28 %] 28 % (03/08 0730)  Intake/Output from previous day: 03/07 0701 - 03/08 0700 In: 660 [P.O.:660] Out: 0  Intake/Output this shift:   Nutritional status: DIET - DYS 1 Room service appropriate?: Yes; Fluid consistency:: Thin  Neurologic Exam: Mental Status: Patient does not respond to verbal stimuli.  With deep sternal rub localizes with LUE.  Does not follow commands.  No verbalizations noted.  Cranial Nerves: II: patient does not respond confrontation bilaterally, pupils right 3 mm, left 3 mm,and reactive bilaterally III,IV,VI: doll's response absent bilaterally.  V,VII: corneal reflex present bilaterally  VIII: patient does not respond to verbal stimuli IX,X: gag reflex reduced, XI: trapezius strength unable to test bilaterally XII: tongue strength unable to test Motor: Only LUE movement noted in response to pain.  No movement noted on the right Sensory: Does not respond to noxious stimuli in any extremity. Plantars: Upgoing bilaterally   Lab Results: Basic Metabolic Panel:  Recent Labs Lab 05/22/15 1719 05/22/15 2230 05/23/15 0639 05/24/15 1403  NA 131* 135  --  134*  K 6.4* 3.4*  --  5.0  CL 91* 95*  --  94*  CO2 22 31  --  28  GLUCOSE 110* 112*  --  338*  BUN 43* 31*  --  50*  CREATININE 4.27* 3.02*  --  4.76*  CALCIUM 9.5 8.9  --  9.3  MG  --   --  2.1  --   PHOS  --   --   --  2.1*    Liver Function  Tests:  Recent Labs Lab 05/24/15 1403  ALBUMIN 2.7*   No results for input(s): LIPASE, AMYLASE in the last 168 hours. No results for input(s): AMMONIA in the last 168 hours.  CBC:  Recent Labs Lab 05/22/15 1719 05/22/15 2230 05/24/15 1403  WBC 6.5 5.4 5.6  NEUTROABS 5.2  --  4.3  HGB 12.7 10.9* 11.0*  HCT 39.1 33.2* 34.7*  MCV 95.1 92.5 91.9  PLT 148* 165 188    Cardiac Enzymes:  Recent Labs Lab 05/22/15 1719  TROPONINI 0.15*    Lipid Panel: No results for input(s): CHOL, TRIG, HDL, CHOLHDL, VLDL, LDLCALC in the last 168 hours.  CBG:  Recent Labs Lab 05/25/15 1152 05/25/15 1641 05/25/15 2202 05/26/15 0720 05/26/15 1128  GLUCAP 238* 223* 241* 177* 135*    Microbiology: Results for orders placed or performed during the hospital encounter of 05/22/15  MRSA PCR Screening     Status: None   Collection Time: 05/22/15  9:32 PM  Result Value Ref Range Status   MRSA by PCR NEGATIVE NEGATIVE Final    Comment:        The GeneXpert MRSA Assay (FDA approved for NASAL specimens only), is one component of a comprehensive MRSA colonization surveillance program. It is not intended to diagnose MRSA infection nor to guide  or monitor treatment for MRSA infections.     Coagulation Studies: No results for input(s): LABPROT, INR in the last 72 hours.  Imaging: No results found.  Medications:  I have reviewed the patient's current medications. Scheduled: . acidophilus  1 capsule Oral Daily  . aspirin EC  81 mg Oral Daily  . atorvastatin  80 mg Oral Daily  . calcium acetate  1,334 mg Oral TID WC  . cholecalciferol  2,000 Units Oral Daily  . clopidogrel  75 mg Oral Daily  . docusate sodium  100 mg Oral QHS  . famotidine  20 mg Oral Daily  . feeding supplement (NEPRO CARB STEADY)  237 mL Oral BID BM  . heparin  5,000 Units Subcutaneous 3 times per day  . insulin aspart  0-9 Units Subcutaneous TID AC & HS  . insulin glargine  8 Units Subcutaneous Daily  .  iron polysaccharides  150 mg Oral Daily  . levETIRAcetam  500 mg Intravenous Q12H  . levETIRAcetam  500 mg Oral Q M,W,F-2000  . levETIRAcetam  750 mg Oral Q24H  . sodium chloride flush  3 mL Intravenous Q12H    Assessment/Plan: Patient with decreased mental status likely secondary to multiple seizures and Ativan administration.  No further seizure activity noted at this time.  EEG on yesterday showed no epileptiform activity.  Patient on Keppra.   Recommendations: 1.  Keppra  IV now 2.  Increase daily Keppra to .  Continue the  Keppra after dialysis.   3.  Continue seizure precautions 4.  MRI of the brain without contrast 5.  Ativan  prn seizure activity.     LOS: 4 days   Thana Farr, MD Neurology [phone] 05/26/2015  1:06 PM

## 2015-05-26 NOTE — Progress Notes (Signed)
Rapid response called due to pt having seizure activity pt minimally responsive post seizure Dr. Luberta MutterKonidena at bedside she notified Dr. Thad Rangereynolds; RN administered 2 mg IV push ativan once no other seizure activity present pt on 2L O2 via nasal canula and vss per Dr. Suzanne BoronKonidena's orders pt to remain in assigned room; Dr Thad Rangereynolds stated she would assess pt at the bedside

## 2015-05-26 NOTE — Progress Notes (Signed)
TX started 

## 2015-05-26 NOTE — Therapy (Signed)
Responded to rapid response. Patient found minimally responsive with SpO2 86% on room air. Nurse reported seizure activity. Patient placed on NRB at 1.00, SpO2 increased to 100%, EKG done, VS stable, O2 decreased to 2 lpm, SpO2 99%.

## 2015-05-26 NOTE — Progress Notes (Signed)
[facility] [place] [facility]   PATIENT NAME: [name]    MR#:  [id number]  DATE OF BIRTH:  08/30/1945  Had 3 episodes seizures  Back to back this am.so received ativan 2mg  and extradose of keppra 500mg  IV> Non verbal at baseline,  CHIEF COMPLAINT:   Chief Complaint  Patient presents with  . Respiratory Distress    REVIEW OF SYSTEMS:    Review of Systems  Unable to perform ROS: dementia    Nutrition:  Tolerating Diet: No Tolerating PT:      DRUG ALLERGIES:   Allergies  Allergen Reactions  . Contrast Media [Iodinated Diagnostic Agents] Anaphylaxis    VITALS:  Blood pressure 107/61, pulse 76, temperature 97.8 F (36.6 C), temperature source Oral, resp. rate 15, height 5\' 6"  (1.676 m), weight 68.9 kg (151 lb 14.4 oz), SpO2 100 %.  PHYSICAL EXAMINATION:   Physical Exam  GENERAL:  70 y.o.-year-old patient lying in the bed with no acute distress.  EYES: Pupils equal, round, reactive to light and accommodation. No scleral icterus. Extraocular muscles intact.  HEENT: Head atraumatic, normocephalic. Oropharynx and nasopharynx clear.  NECK:  Supple, no jugular venous distention. No thyroid enlargement, no tenderness.  LUNGS: Normal breath sounds bilaterally, no wheezing, rales,rhonchi or crepitation. No use of accessory muscles of respiration.  CARDIOVASCULAR: S1, S2 normal. No murmurs, rubs, or gallops.  ABDOMEN: Soft, nontender, nondistended. Bowel sounds present. No organomegaly or mass.  EXTREMITIES: No pedal edema, cyanosis, or clubbing.  NEUROLOGIC: Unable to do full neurological exam because of her dementia. PSYCHIATRIC: Patient is slightly lethargic.  SKIN: No obvious rash, lesion, or ulcer.    LABORATORY PANEL:   CBC  Recent Labs Lab 05/24/15 1403  WBC 5.6  HGB 11.0*  HCT 34.7*  PLT 188    ------------------------------------------------------------------------------------------------------------------  Chemistries   Recent Labs Lab 05/23/15 0639 05/24/15 1403  NA  --  134*  K  --  5.0  CL  --  94*  CO2  --  28  GLUCOSE  --  338*  BUN  --  50*  CREATININE  --  4.76*  CALCIUM  --  9.3  MG 2.1  --    ------------------------------------------------------------------------------------------------------------------  Cardiac Enzymes  Recent Labs Lab 05/22/15 1719  TROPONINI 0.15*   ------------------------------------------------------------------------------------------------------------------  RADIOLOGY:  No results found.   ASSESSMENT AND PLAN:   Principal Problem:   Pulmonary edema Active Problems:   Seizures (HCC)   Hyperkalemia   #1 acute respiratory failure secondary to pulmonary edema in the contest of ESRD: Hemodialysis today. Received emergency hemodialysis on admission because of hyperkalemia and respiratory distress. Her respiratory distress is resolved now, #2 hyperkalemia with EKG changes and also received hemodialysis  Received calcium gluconate, dextrose, insulin. Potassium decreased from 6.5-3.4. #3 history of coronary artery disease: Continue Plavix. #4 dementia #6 diabetes mellitus type 2: continue SSI with coverage #7seizures;on Keppra ,dose increased to 1000mg  po daily,MRI brain today and seen by neuro,continue seizure precautions.  . 8  hypertension ;controlled.  DNR  All the records are reviewed and case discussed with Care Management/Social Workerr. Management plans discussed with the patient, family and they are in agreement.  CODE STATUS: DNR TOTAL TIME TAKING CARE OF THIS PATIENT: 30 minminutes.   POSSIBLE D/C IN 3-4DAYS, DEPENDING ON CLINICAL CONDITION.   Katha HammingKONIDENA,Fransisco Messmer M.D on 05/26/2015 at 7:01 PM  Between 7am to 6pm - Pager - [phone]  After 6pm go to [email or web address] - password [email or web address] [email or web address]  Office  [phone]  CC: Primary care physician; Amy Diana Eves, NP

## 2015-05-26 NOTE — Progress Notes (Signed)
PRE HD   

## 2015-05-27 ENCOUNTER — Inpatient Hospital Stay: Payer: Medicare Other

## 2015-05-27 LAB — GLUCOSE, CAPILLARY
GLUCOSE-CAPILLARY: 265 mg/dL — AB (ref 65–99)
GLUCOSE-CAPILLARY: 281 mg/dL — AB (ref 65–99)
Glucose-Capillary: 123 mg/dL — ABNORMAL HIGH (ref 65–99)
Glucose-Capillary: 247 mg/dL — ABNORMAL HIGH (ref 65–99)

## 2015-05-27 NOTE — Progress Notes (Addendum)
Discontinue order for lantus 8 units per Dr. Winona LegatoVaickute

## 2015-05-27 NOTE — Progress Notes (Signed)
[facility] - [place] at [facility]   PATIENT NAME: [name]    MR#:  [id number]  DATE OF BIRTH:  April 25, 1945  Had 3 episodes seizures  Back to back this am.so received ativan  and extradose of keppra  IV> Non verbal at baseline,  CHIEF COMPLAINT:   Chief Complaint  Patient presents with  . Respiratory Distress   patient is 70 year old who presents to the hospital with complaints of seizure activity. Patient has dementia, not able to provide any review of systems, nonverbal. Neurologist recommended. Brain MRI. Patient's Keppra was advanced. No more seizure activity  REVIEW OF SYSTEMS:    Review of Systems  Unable to perform ROS: dementia    Nutrition:  Tolerating Diet: No Tolerating PT:      DRUG ALLERGIES:   Allergies  Allergen Reactions  . Contrast Media [Iodinated Diagnostic Agents] Anaphylaxis    VITALS:  Blood pressure 120/77, pulse 99, temperature 98.2 F (36.8 C), temperature source Oral, resp. rate 16, height  (1.676 m), weight 68.9 kg (151 lb 14.4 oz), SpO2 100 %.  PHYSICAL EXAMINATION:   Physical Exam  GENERAL:  70 y.o.-year-old patient lying in the bed with no acute distress. Sitting in the bed, not responding to verbal stimuli, nonverbal, not able to provide review of systems of follow commands EYES: Pupils equal, round, reactive to light and accommodation. No scleral icterus. Extraocular muscles intact.  HEENT: Head atraumatic, normocephalic. Oropharynx and nasopharynx clear.  NECK:  Supple, no jugular venous distention. No thyroid enlargement, no tenderness.  LUNGS: Normal breath sounds bilaterally, no wheezing, rales,rhonchi or crepitation. No use of accessory muscles of respiration.  CARDIOVASCULAR: S1, S2 normal. No murmurs, rubs, or gallops.  ABDOMEN: Soft, nontender, nondistended. Bowel sounds present. No organomegaly or mass.  EXTREMITIES: No pedal edema, cyanosis, or clubbing.  NEUROLOGIC: Unable to do  full neurological exam because of her dementia. PSYCHIATRIC: Patient is somewhat lethargic, leaning towards the left.  SKIN: No obvious rash, lesion, or ulcer.    LABORATORY PANEL:   CBC  Recent Labs Lab 05/24/15 1403  WBC 5.6  HGB 11.0*  HCT 34.7*  PLT 188   ------------------------------------------------------------------------------------------------------------------  Chemistries   Recent Labs Lab 05/23/15 0639 05/24/15 1403  NA  --  134*  K  --  5.0  CL  --  94*  CO2  --  28  GLUCOSE  --  338*  BUN  --  50*  CREATININE  --  4.76*  CALCIUM  --  9.3  MG 2.1  --    ------------------------------------------------------------------------------------------------------------------  Cardiac Enzymes  Recent Labs Lab 05/22/15 1719  TROPONINI 0.15*   ------------------------------------------------------------------------------------------------------------------  RADIOLOGY:  Mr Brain Wo Contrast  05/27/2015  CLINICAL DATA:  Seizure activity. Minimally responsive post seizure. EXAM: MRI HEAD WITHOUT CONTRAST TECHNIQUE: Multiplanar, multiecho pulse sequences of the brain and surrounding structures were obtained without intravenous contrast. COMPARISON:  CT head 05/23/2015. FINDINGS: No evidence for acute infarction, hemorrhage, mass lesion, or extra-axial fluid. Severe generalized atrophy. Extensive white matter disease. Hydrocephalus ex vacuo. Large remote LEFT occipital PCA infarct. Smaller remote LEFT frontal and parietal infarcts. Small remote RIGHT cerebellar infarct. Flow voids are maintained. Tiny focus chronic hemorrhage RIGHT cerebellar tonsil. Trace gyriform like chronic hemorrhage in the region of the LEFT occipital chronic infarct. Larger focus of chronic hemorrhage RIGHT basal ganglia/subcortical white matter; altered susceptibility in the region of chronic hemorrhage, with abnormalities on both the gradient sequence and diffusion. No evidence for large vessel  occlusion.  High-resolution coronal imaging demonstrates atrophic hippocampi by bilaterally. No temporal lobe mass or inflammation. Partial empty sella. Extracranial soft tissues unremarkable. Negative orbits status post cataract extraction. IMPRESSION: Chronic changes as described.  No acute intracranial findings. No evidence for restricted diffusion to suggest post ictal phenomenon or other acute finding of significance. Electronically Signed   By: Elsie StainJohn T Curnes M.D.   On: 05/27/2015 14:13     ASSESSMENT AND PLAN:   Principal Problem:   Pulmonary edema Active Problems:   Seizures (HCC)   Hyperkalemia   #1 acute respiratory failure with hypoxia secondary to pulmonary edema in the contest of ESRD: Hemodialysis yesterday, now on 2 L of oxygen through nasal cannula. Received emergency hemodialysis on admission because of hyperkalemia and respiratory distress. Her respiratory distress is resolved now,, weaned off oxygen as tolerated #2 hyperkalemia with EKG changes and also received hemodialysis  Received calcium gluconate, dextrose, insulin. Potassium decreased from 6.5-3.4. Repeat potassium level tomorrow morning #3 history of coronary artery disease: Continue Plavix. #4 dementia, MRI of brain revealed chronic changes, no acute intracranial findings, suggesting worsening dementia #6 diabetes mellitus type 2: continue SSI with coverage #7seizures;on Keppra ,dose increased to 1000mg  po daily,MRI brain done and showed no acute changes and seen by neuro,continue seizure precautions. 8  hypertension ;controlled.  DNR  All the records are reviewed and case discussed with Care Management/Social Workerr. Management plans discussed with the patient, family and they are in agreement.  CODE STATUS: DNR TOTAL TIME TAKING CARE OF THIS PATIENT: 45  minutes.  Discussed this patient's daughter for about 10 minutes POSSIBLE D/C IN 3-4DAYS, DEPENDING ON CLINICAL CONDITION.   Katharina CaperVAICKUTE,Kary Colaizzi M.D on  05/27/2015 at 4:18 PM  Between 7am to 6pm - Pager - [phone]  After 6pm go to [email or web address] - password [email or web address] [facility] [place] [facility]  Office  [phone]  CC: Primary care physician; Amy Diana EvesL Krebs, NP

## 2015-05-27 NOTE — Progress Notes (Signed)
Subjective:   Not very responsive this morning.   Objective:  Vital signs in last 24 hours:  Temp:  [97.5 F (36.4 C)-97.9 F (36.6 C)] 97.5 F (36.4 C) (03/09 0509) Pulse Rate:  [55-84] 84 (03/09 0509) Resp:  [15-29] 18 (03/09 0509) BP: (99-125)/(61-80) 125/70 mmHg (03/09 0509) SpO2:  [95 %-100 %] 100 % (03/09 0509) Weight:  [66.9 kg (147 lb 7.8 oz)-68.9 kg (151 lb 14.4 oz)] 68.9 kg (151 lb 14.4 oz) (03/08 1716)  Weight change:  Filed Weights   05/24/15 1452 05/26/15 1405 05/26/15 1716  Weight: 64.3 kg (141 lb 12.1 oz) 66.9 kg (147 lb 7.8 oz) 68.9 kg (151 lb 14.4 oz)    Intake/Output:    Intake/Output Summary (Last 24 hours) at 05/27/15 1145 Last data filed at 05/27/15 0900  Gross per 24 hour  Intake    360 ml  Output   1000 ml  Net   -640 ml     Physical Exam: General:  no acute distress,sitting up in bed, thin, frail   HEENT  moist mucous membranes   Neck  supple   Pulm/lungs  normal effort, clear  CVS/Heart  irregular   Abdomen:   soft, nontender   Extremities:  no peripheral edema   Neurologic:  able to follow commands but nonverbal   Skin:  no rashes   Access: Left upper extremity AVG       Basic Metabolic Panel:   Recent Labs Lab 05/22/15 1719 05/22/15 2230 05/23/15 0639 05/24/15 1403  NA 131* 135  --  134*  K 6.4* 3.4*  --  5.0  CL 91* 95*  --  94*  CO2 22 31  --  28  GLUCOSE 110* 112*  --  338*  BUN 43* 31*  --  50*  CREATININE 4.27* 3.02*  --  4.76*  CALCIUM 9.5 8.9  --  9.3  MG  --   --  2.1  --   PHOS  --   --   --  2.1*     CBC:  Recent Labs Lab 05/22/15 1719 05/22/15 2230 05/24/15 1403  WBC 6.5 5.4 5.6  NEUTROABS 5.2  --  4.3  HGB 12.7 10.9* 11.0*  HCT 39.1 33.2* 34.7*  MCV 95.1 92.5 91.9  PLT 148* 165 188      Microbiology:  Recent Results (from the past 720 hour(s))  MRSA PCR Screening     Status: None   Collection Time: 05/22/15  9:32 PM  Result Value Ref Range Status   MRSA by PCR NEGATIVE NEGATIVE Final     Comment:        The GeneXpert MRSA Assay (FDA approved for NASAL specimens only), is one component of a comprehensive MRSA colonization surveillance program. It is not intended to diagnose MRSA infection nor to guide or monitor treatment for MRSA infections.     Coagulation Studies: No results for input(s): LABPROT, INR in the last 72 hours.  Urinalysis: No results for input(s): COLORURINE, LABSPEC, PHURINE, GLUCOSEU, HGBUR, BILIRUBINUR, KETONESUR, PROTEINUR, UROBILINOGEN, NITRITE, LEUKOCYTESUR in the last 72 hours.  Invalid input(s): APPERANCEUR    Imaging: No results found.   Medications:     . acidophilus  1 capsule Oral Daily  . aspirin EC  81 mg Oral Daily  . atorvastatin  80 mg Oral Daily  . calcium acetate  1,334 mg Oral TID WC  . cholecalciferol  2,000 Units Oral Daily  . clopidogrel  75 mg Oral Daily  . docusate  sodium  100 mg Oral QHS  . famotidine  20 mg Oral Daily  . feeding supplement (NEPRO CARB STEADY)  237 mL Oral BID BM  . heparin  5,000 Units Subcutaneous 3 times per day  . insulin aspart  0-9 Units Subcutaneous TID AC & HS  . iron polysaccharides  150 mg Oral Daily  . levETIRAcetam  500 mg Intravenous Q12H  . levETIRAcetam  1,000 mg Oral Q24H  . levETIRAcetam  500 mg Oral Q M,W,F-2000  . sodium chloride flush  3 mL Intravenous Q12H   bisacodyl, lidocaine-prilocaine, LORazepam  Assessment/ Plan:  70 y.o. female with past medical history of end-stage renal disease, myocardial infarction, CVA, hyperlipidemia, hypertension, diabetes mellitus, depression, secondary hyperparathyroidism, dementia, GERD, anemia chronic kidney disease, History of angioedema due to oxygen tubing.   [facility] [address]   1. End-stage renal disease with hyperkalemia: Continue MWF schedule.  Will discuss care with family. Overall prognosis is poor.   2. Anemia chronic kidney disease. Hemoglobin 11 - holding Procrit due to concern of seizures  3.  Secondary hyperparathyroidism: phosphorus 2.1 - Calcium acetate  4. Hypertension: blood pressure at goal.   5. Diabetes mellitus type II with chronic kidney disease: insulin dependent.  - Continue glucose control.    LOS: 5 Elaysia Devargas 3/9/201711:45 AM

## 2015-05-27 NOTE — Care Management Important Message (Signed)
Important Message  Patient Details  Name: [name] MRN: [id number] Date of Birth: 09/05/1945   Medicare Important Message Given:  Yes    Olegario MessierKathy A Sheli Dorin 05/27/2015, 12:04 PM

## 2015-05-27 NOTE — Progress Notes (Signed)
Subjective: Patient awake and alert today.  Eating breakfast.    Objective: Current vital signs: BP 125/70 mmHg  Pulse 84  Temp(Src) 97.5 F (36.4 C) (Oral)  Resp 18  Ht 5\' 6"  (1.676 m)  Wt 68.9 kg (151 lb 14.4 oz)  BMI 24.53 kg/m2  SpO2 100% Vital signs in last 24 hours: Temp:  [97.5 F (36.4 C)-97.9 F (36.6 C)] 97.5 F (36.4 C) (03/09 0509) Pulse Rate:  [55-84] 84 (03/09 0509) Resp:  [15-29] 18 (03/09 0509) BP: (99-125)/(61-80) 125/70 mmHg (03/09 0509) SpO2:  [95 %-100 %] 100 % (03/09 0509) Weight:  [66.9 kg (147 lb 7.8 oz)-68.9 kg (151 lb 14.4 oz)] 68.9 kg (151 lb 14.4 oz) (03/08 1716)  Intake/Output from previous day: 03/08 0701 - 03/09 0700 In: 0  Out: 1000  Intake/Output this shift: Total I/O In: 360 [P.O.:360] Out: -  Nutritional status: DIET - DYS 1 Room service appropriate?: Yes; Fluid consistency:: Thin  Neurologic Exam: Mental Status: Alert.  Does not follow commands. No verbalizations noted.  Cranial Nerves: II: Pupils right 3 mm, left 3 mm,and reactive bilaterally III,IV,VI: doll's response present bilaterally.  V,VII: corneal reflex present bilaterally    Lab Results: Basic Metabolic Panel:  Recent Labs Lab 05/22/15 1719 05/22/15 2230 05/23/15 0639 05/24/15 1403  NA 131* 135  --  134*  K 6.4* 3.4*  --  5.0  CL 91* 95*  --  94*  CO2 22 31  --  28  GLUCOSE 110* 112*  --  338*  BUN 43* 31*  --  50*  CREATININE 4.27* 3.02*  --  4.76*  CALCIUM 9.5 8.9  --  9.3  MG  --   --  2.1  --   PHOS  --   --   --  2.1*    Liver Function Tests:  Recent Labs Lab 05/24/15 1403  ALBUMIN 2.7*   No results for input(s): LIPASE, AMYLASE in the last 168 hours. No results for input(s): AMMONIA in the last 168 hours.  CBC:  Recent Labs Lab 05/22/15 1719 05/22/15 2230 05/24/15 1403  WBC 6.5 5.4 5.6  NEUTROABS 5.2  --  4.3  HGB 12.7 10.9* 11.0*  HCT 39.1 33.2* 34.7*  MCV 95.1 92.5 91.9  PLT 148* 165 188    Cardiac Enzymes:  Recent  Labs Lab 05/22/15 1719  TROPONINI 0.15*    Lipid Panel: No results for input(s): CHOL, TRIG, HDL, CHOLHDL, VLDL, LDLCALC in the last 168 hours.  CBG:  Recent Labs Lab 05/26/15 0720 05/26/15 1128 05/26/15 1819 05/26/15 2134 05/27/15 0719  GLUCAP 177* 135* 98 120* 123*    Microbiology: Results for orders placed or performed during the hospital encounter of 05/22/15  MRSA PCR Screening     Status: None   Collection Time: 05/22/15  9:32 PM  Result Value Ref Range Status   MRSA by PCR NEGATIVE NEGATIVE Final    Comment:        The GeneXpert MRSA Assay (FDA approved for NASAL specimens only), is one component of a comprehensive MRSA colonization surveillance program. It is not intended to diagnose MRSA infection nor to guide or monitor treatment for MRSA infections.     Coagulation Studies: No results for input(s): LABPROT, INR in the last 72 hours.  Imaging: No results found.  Medications:  I have reviewed the patient's current medications. Scheduled: . acidophilus  1 capsule Oral Daily  . aspirin EC  81 mg Oral Daily  . atorvastatin  80 mg  Oral Daily  . calcium acetate  1,334 mg Oral TID WC  . cholecalciferol  2,000 Units Oral Daily  . clopidogrel  75 mg Oral Daily  . docusate sodium  100 mg Oral QHS  . famotidine  20 mg Oral Daily  . feeding supplement (NEPRO CARB STEADY)  237 mL Oral BID BM  . heparin  5,000 Units Subcutaneous 3 times per day  . insulin aspart  0-9 Units Subcutaneous TID AC & HS  . iron polysaccharides  150 mg Oral Daily  . levETIRAcetam  500 mg Intravenous Q12H  . levETIRAcetam  1,000 mg Oral Q24H  . levETIRAcetam  500 mg Oral Q M,W,F-2000  . sodium chloride flush  3 mL Intravenous Q12H    Assessment/Plan: Patient improved today.  No further seizures noted.  Tolerating increased dose of Keppra.    Recommendations: 1.  Continue Keppra at current dose 2.  Continue seizure precautions    LOS: 5 days   Thana Farr,  MD Neurology [phone] 05/27/2015  10:55 AM

## 2015-05-28 LAB — CBC
HEMATOCRIT: 37.5 % (ref 35.0–47.0)
Hemoglobin: 11.9 g/dL — ABNORMAL LOW (ref 12.0–16.0)
MCH: 29.4 pg (ref 26.0–34.0)
MCHC: 31.8 g/dL — AB (ref 32.0–36.0)
MCV: 92.4 fL (ref 80.0–100.0)
Platelets: 222 10*3/uL (ref 150–440)
RBC: 4.06 MIL/uL (ref 3.80–5.20)
RDW: 21.4 % — AB (ref 11.5–14.5)
WBC: 6.3 10*3/uL (ref 3.6–11.0)

## 2015-05-28 LAB — BASIC METABOLIC PANEL
Anion gap: 14 (ref 5–15)
BUN: 65 mg/dL — AB (ref 6–20)
CALCIUM: 9.2 mg/dL (ref 8.9–10.3)
CHLORIDE: 97 mmol/L — AB (ref 101–111)
CO2: 26 mmol/L (ref 22–32)
CREATININE: 5.85 mg/dL — AB (ref 0.44–1.00)
GFR calc Af Amer: 8 mL/min — ABNORMAL LOW (ref >60)
GFR calc non Af Amer: 7 mL/min — ABNORMAL LOW (ref >60)
Glucose, Bld: 217 mg/dL — ABNORMAL HIGH (ref 65–99)
Potassium: 5.2 mmol/L — ABNORMAL HIGH (ref 3.5–5.1)
SODIUM: 137 mmol/L (ref 135–145)

## 2015-05-28 LAB — GLUCOSE, CAPILLARY
Glucose-Capillary: 174 mg/dL — ABNORMAL HIGH (ref 65–99)
Glucose-Capillary: 108 mg/dL — ABNORMAL HIGH (ref 65–99)
Glucose-Capillary: 182 mg/dL — ABNORMAL HIGH (ref 65–99)
Glucose-Capillary: 215 mg/dL — ABNORMAL HIGH (ref 65–99)
Glucose-Capillary: 239 mg/dL — ABNORMAL HIGH (ref 65–99)

## 2015-05-28 NOTE — Care Management (Signed)
Patient daughter [name] is present.  She has discussed care with Dr. Wynelle LinkKolluru.  She has decided to discontinue outpatient dialysis, and proceed with transition to Hospice Home.  Note from Dayna BarkerKaren Robertson to follow.  CSW facilitating. RNCM signing off.

## 2015-05-28 NOTE — Progress Notes (Signed)
New hospice home referral received from [place]. Ms.Brester is a 70 year old woman with a known history of ESRD, CVA, MI, CAD, Dementia, DM II-nsulin dependent,seizures,  GERD and CHF, admitted to [facility] from [facility] on 3/4 for treatment of seizure activity. In the ED she was found to have a potassium of 6 and chest xray showed pulmonary edema. She received emergent dialysis, last dialysis was the day before admission. She has a history of seizures and had a rapid response was called on 3/8 d/t witnessed seizure activity per chart note review. Neurology was consulted as patient remained unresponsive after this episode. MRI has been performed with no changes noted. Keppra dose was increased. Per chart note review patient continues with a poor prognosis.  Patient's daughter contacted the referral center of [facility] [place] with questions regarding hospice services and wanted to meet at the hospital to discuss futher.  Writer met in the family room patient's [name] and son in Scientist, research (physical sciences) along with [employer] and [employer]. Writer detailed/outlined/explained hospice services in the home as well as at the hospice home. Prognosis was discussed related to stopping dialysis and what to expect as well as how symptoms would be managed. Both would entail stopping dialysis. Barnett Applebaum and Steph decided on stopping dialysis and focusing on symptom management and comfort at the hospice home, with planned transport by EMS tomorrow 3/11. Consents signed, patient information faxed to referral. Hospital care team made aware of and in agreement with discharge plan. Staff RN to call report to hospice home tomorrow 3/11. [phone]). Patient will need EMS transport, signed DNR in place in patient's chart. Thank you for the opportunity to ve involved in the care of this patient and her family. Flo Shanks RN, BSN, [place] [facility]  [place], [facility] [phone]

## 2015-05-28 NOTE — Progress Notes (Addendum)
Subjective: No further seizures noted.  Eating breakfast.  Objective: Current vital signs: BP 108/64 mmHg  Pulse 81  Temp(Src) 98.2 F (36.8 C) (Oral)  Resp 20  Ht  (1.676 m)  Wt 68.9 kg (151 lb 14.4 oz)  BMI 24.53 kg/m2  SpO2 100% Vital signs in last 24 hours: Temp:  [98 F (36.7 C)-98.2 F (36.8 C)] 98.2 F (36.8 C) (03/10 0418) Pulse Rate:  [81-99] 81 (03/10 0418) Resp:  [16-20] 20 (03/10 0418) BP: (108-120)/(64-77) 108/64 mmHg (03/10 0418) SpO2:  [100 %] 100 % (03/10 0418)  Intake/Output from previous day: 03/09 0701 - 03/10 0700 In: 480 [P.O.:480] Out: 0  Intake/Output this shift:   Nutritional status: DIET - DYS 1 Room service appropriate?: Yes; Fluid consistency:: Thin  Neurologic Exam: Mental Status: Alert. Does not follow commands. No verbalizations noted.  Cranial Nerves: II: Pupils right 3 mm, left 3 mm,and reactive bilaterally III,IV,VI: doll's response present bilaterally.  V,VII: corneal reflex present bilaterally   Lab Results: Basic Metabolic Panel:  Recent Labs Lab 05/22/15 1719 05/22/15 2230 05/23/15 0639 05/24/15 1403 05/28/15 0519  NA 131* 135  --  134* 137  K 6.4* 3.4*  --  5.0 5.2*  CL 91* 95*  --  94* 97*  CO2 22 31  --  28 26  GLUCOSE 110* 112*  --  338* 217*  BUN 43* 31*  --  50* 65*  CREATININE 4.27* 3.02*  --  4.76* 5.85*  CALCIUM 9.5 8.9  --  9.3 9.2  MG  --   --  2.1  --   --   PHOS  --   --   --  2.1*  --     Liver Function Tests:  Recent Labs Lab 05/24/15 1403  ALBUMIN 2.7*   No results for input(s): LIPASE, AMYLASE in the last 168 hours. No results for input(s): AMMONIA in the last 168 hours.  CBC:  Recent Labs Lab 05/22/15 1719 05/22/15 2230 05/24/15 1403  WBC 6.5 5.4 5.6  NEUTROABS 5.2  --  4.3  HGB 12.7 10.9* 11.0*  HCT 39.1 33.2* 34.7*  MCV 95.1 92.5 91.9  PLT 148* 165 188    Cardiac Enzymes:  Recent Labs Lab 05/22/15 1719  TROPONINI 0.15*    Lipid Panel: No results for  input(s): CHOL, TRIG, HDL, CHOLHDL, VLDL, LDLCALC in the last 168 hours.  CBG:  Recent Labs Lab 05/27/15 0719 05/27/15 1139 05/27/15 1630 05/27/15 1954 05/28/15 0730  GLUCAP 123* 247* 265* 281* 174*    Microbiology: Results for orders placed or performed during the hospital encounter of 05/22/15  MRSA PCR Screening     Status: None   Collection Time: 05/22/15  9:32 PM  Result Value Ref Range Status   MRSA by PCR NEGATIVE NEGATIVE Final    Comment:        The GeneXpert MRSA Assay (FDA approved for NASAL specimens only), is one component of a comprehensive MRSA colonization surveillance program. It is not intended to diagnose MRSA infection nor to guide or monitor treatment for MRSA infections.     Coagulation Studies: No results for input(s): LABPROT, INR in the last 72 hours.  Imaging: Mr Brain Wo Contrast  05/27/2015  CLINICAL DATA:  Seizure activity. Minimally responsive post seizure. EXAM: MRI HEAD WITHOUT CONTRAST TECHNIQUE: Multiplanar, multiecho pulse sequences of the brain and surrounding structures were obtained without intravenous contrast. COMPARISON:  CT head 05/23/2015. FINDINGS: No evidence for acute infarction, hemorrhage, mass lesion, or extra-axial  fluid. Severe generalized atrophy. Extensive white matter disease. Hydrocephalus ex vacuo. Large remote LEFT occipital PCA infarct. Smaller remote LEFT frontal and parietal infarcts. Small remote RIGHT cerebellar infarct. Flow voids are maintained. Tiny focus chronic hemorrhage RIGHT cerebellar tonsil. Trace gyriform like chronic hemorrhage in the region of the LEFT occipital chronic infarct. Larger focus of chronic hemorrhage RIGHT basal ganglia/subcortical white matter; altered susceptibility in the region of chronic hemorrhage, with abnormalities on both the gradient sequence and diffusion. No evidence for large vessel occlusion. High-resolution coronal imaging demonstrates atrophic hippocampi by bilaterally. No  temporal lobe mass or inflammation. Partial empty sella. Extracranial soft tissues unremarkable. Negative orbits status post cataract extraction. IMPRESSION: Chronic changes as described.  No acute intracranial findings. No evidence for restricted diffusion to suggest post ictal phenomenon or other acute finding of significance. Electronically Signed   By: Elsie StainJohn T Curnes M.D.   On: 05/27/2015 14:13    Medications:  I have reviewed the patient's current medications. Scheduled: . acidophilus  1 capsule Oral Daily  . aspirin EC  81 mg Oral Daily  . atorvastatin  80 mg Oral Daily  . calcium acetate  1,334 mg Oral TID WC  . cholecalciferol  2,000 Units Oral Daily  . clopidogrel  75 mg Oral Daily  . docusate sodium  100 mg Oral QHS  . famotidine  20 mg Oral Daily  . feeding supplement (NEPRO CARB STEADY)  237 mL Oral BID BM  . heparin  5,000 Units Subcutaneous 3 times per day  . insulin aspart  0-9 Units Subcutaneous TID AC & HS  . iron polysaccharides  150 mg Oral Daily  . levETIRAcetam  500 mg Intravenous Q12H  . levETIRAcetam  1,000 mg Oral Q24H  . levETIRAcetam  500 mg Oral Q M,W,F-2000  . sodium chloride flush  3 mL Intravenous Q12H    Assessment/Plan: No further seizures noted. Tolerating increased dose of Keppra. MRI of the brain personally reviewed and shows no acute changes.    Recommendations: 1. Continue Keppra at current dose 2. Continue seizure precautions 3.  No further neurologic intervention is recommended at this time.  If further questions arise, please call or page at that time.  Thank you for allowing neurology to participate in the care of this patient.    LOS: 6 days   Thana FarrLeslie Vina Byrd, MD Neurology [phone] 05/28/2015  9:05 AM

## 2015-05-28 NOTE — Progress Notes (Signed)
Nutrition Brief Note  Chart reviewed. Pt now transitioning to comfort care with transition to hospice home and discontinuing dialysis No further nutrition interventions warranted at this time.  Please re-consult as needed.   Chelci [place] B. Freida BusmanAllen, RD, LDN [phone] (pager) Weekend/On-Call pager [phone])

## 2015-05-28 NOTE — Progress Notes (Addendum)
CSW met with patient's daughter/ Legal Guardian Academic librarian Love) and her husband with [place] Liaison and Isaias Cowman RN Case Manager. Patient's daughter has decided to discontinue outpatient dialysis and transition to patient to [facility] tomorrow 05-29-15. Patient to be transported via EMS. CSW spoke to MD [facility] to inform her of above. CSW will continue to follow and assist.  Ernest Pine, MSW, [place] Work Department [phone]

## 2015-05-28 NOTE — Progress Notes (Signed)
Subjective:   Seen and examined on hemodialysis Not very responsive Spoke to Daughter last night about palliative care and end of life.   Objective:  Vital signs in last 24 hours:  Temp:  [97.6 F (36.4 C)-98.2 F (36.8 C)] 97.6 F (36.4 C) (03/10 1115) Pulse Rate:  [81-99] 81 (03/10 0418) Resp:  [16-20] 20 (03/10 0418) BP: (108-120)/(64-77) 108/64 mmHg (03/10 0418) SpO2:  [100 %] 100 % (03/10 0418) Weight:  [65.9 kg (145 lb 4.5 oz)] 65.9 kg (145 lb 4.5 oz) (03/10 1115)  Weight change:  Filed Weights   05/26/15 1405 05/26/15 1716 05/28/15 1115  Weight: 66.9 kg (147 lb 7.8 oz) 68.9 kg (151 lb 14.4 oz) 65.9 kg (145 lb 4.5 oz)    Intake/Output:    Intake/Output Summary (Last 24 hours) at 05/28/15 1153 Last data filed at 05/28/15 0900  Gross per 24 hour  Intake    120 ml  Output      0 ml  Net    120 ml     Physical Exam: General:  no acute distress,sitting up in bed, thin, frail   HEENT  moist mucous membranes   Neck  supple   Pulm/lungs  normal effort, clear  CVS/Heart  irregular   Abdomen:   soft, nontender   Extremities:  no peripheral edema   Neurologic:  not following commands  Skin:  no rashes   Access: Left upper extremity AVG       Basic Metabolic Panel:   Recent Labs Lab 05/22/15 1719 05/22/15 2230 05/23/15 0639 05/24/15 1403 05/28/15 0519  NA 131* 135  --  134* 137  K 6.4* 3.4*  --  5.0 5.2*  CL 91* 95*  --  94* 97*  CO2 22 31  --  28 26  GLUCOSE 110* 112*  --  338* 217*  BUN 43* 31*  --  50* 65*  CREATININE 4.27* 3.02*  --  4.76* 5.85*  CALCIUM 9.5 8.9  --  9.3 9.2  MG  --   --  2.1  --   --   PHOS  --   --   --  2.1*  --      CBC:  Recent Labs Lab 05/22/15 1719 05/22/15 2230 05/24/15 1403  WBC 6.5 5.4 5.6  NEUTROABS 5.2  --  4.3  HGB 12.7 10.9* 11.0*  HCT 39.1 33.2* 34.7*  MCV 95.1 92.5 91.9  PLT 148* 165 188      Microbiology:  Recent Results (from the past 720 hour(s))  MRSA PCR Screening     Status: None    Collection Time: 05/22/15  9:32 PM  Result Value Ref Range Status   MRSA by PCR NEGATIVE NEGATIVE Final    Comment:        The GeneXpert MRSA Assay (FDA approved for NASAL specimens only), is one component of a comprehensive MRSA colonization surveillance program. It is not intended to diagnose MRSA infection nor to guide or monitor treatment for MRSA infections.     Coagulation Studies: No results for input(s): LABPROT, INR in the last 72 hours.  Urinalysis: No results for input(s): COLORURINE, LABSPEC, PHURINE, GLUCOSEU, HGBUR, BILIRUBINUR, KETONESUR, PROTEINUR, UROBILINOGEN, NITRITE, LEUKOCYTESUR in the last 72 hours.  Invalid input(s): APPERANCEUR    Imaging: Mr Brain Wo Contrast  05/27/2015  CLINICAL DATA:  Seizure activity. Minimally responsive post seizure. EXAM: MRI HEAD WITHOUT CONTRAST TECHNIQUE: Multiplanar, multiecho pulse sequences of the brain and surrounding structures were obtained without intravenous contrast. COMPARISON:  CT head 05/23/2015. FINDINGS: No evidence for acute infarction, hemorrhage, mass lesion, or extra-axial fluid. Severe generalized atrophy. Extensive white matter disease. Hydrocephalus ex vacuo. Large remote LEFT occipital PCA infarct. Smaller remote LEFT frontal and parietal infarcts. Small remote RIGHT cerebellar infarct. Flow voids are maintained. Tiny focus chronic hemorrhage RIGHT cerebellar tonsil. Trace gyriform like chronic hemorrhage in the region of the LEFT occipital chronic infarct. Larger focus of chronic hemorrhage RIGHT basal ganglia/subcortical white matter; altered susceptibility in the region of chronic hemorrhage, with abnormalities on both the gradient sequence and diffusion. No evidence for large vessel occlusion. High-resolution coronal imaging demonstrates atrophic hippocampi by bilaterally. No temporal lobe mass or inflammation. Partial empty sella. Extracranial soft tissues unremarkable. Negative orbits status post cataract  extraction. IMPRESSION: Chronic changes as described.  No acute intracranial findings. No evidence for restricted diffusion to suggest post ictal phenomenon or other acute finding of significance. Electronically Signed   By: Elsie StainJohn T Curnes M.D.   On: 05/27/2015 14:13     Medications:     . acidophilus  1 capsule Oral Daily  . aspirin EC  81 mg Oral Daily  . atorvastatin  80 mg Oral Daily  . calcium acetate  1,334 mg Oral TID WC  . cholecalciferol  2,000 Units Oral Daily  . clopidogrel  75 mg Oral Daily  . docusate sodium  100 mg Oral QHS  . famotidine  20 mg Oral Daily  . feeding supplement (NEPRO CARB STEADY)  237 mL Oral BID BM  . heparin  5,000 Units Subcutaneous 3 times per day  . insulin aspart  0-9 Units Subcutaneous TID AC & HS  . iron polysaccharides  150 mg Oral Daily  . levETIRAcetam  500 mg Intravenous Q12H  . levETIRAcetam  1,000 mg Oral Q24H  . levETIRAcetam  500 mg Oral Q M,W,F-2000  . sodium chloride flush  3 mL Intravenous Q12H   bisacodyl, lidocaine-prilocaine, LORazepam  Assessment/ Plan:  70 y.o. female with past medical history of end-stage renal disease, myocardial infarction, CVA, hyperlipidemia, hypertension, diabetes mellitus, depression, secondary hyperparathyroidism, dementia, GERD, anemia chronic kidney disease, History of angioedema due to oxygen tubing.   [facility] [address]   1. End-stage renal disease with hyperkalemia: Continue MWF schedule. Seen examined on dialysis. Tolerating treatment well.  Will discuss care with family. Overall prognosis is poor.   2. Anemia chronic kidney disease. Hemoglobin 11 - holding Procrit due to concern of seizures  3. Secondary hyperparathyroidism: phosphorus 2.1 - Calcium acetate  4. Hypertension: blood pressure at goal.   5. Diabetes mellitus type II with chronic kidney disease: insulin dependent.  - Continue glucose control.    LOS: 6 Zeah Germano 3/10/201711:53 AM

## 2015-05-28 NOTE — Progress Notes (Signed)
[facility] - [place] at [facility]   PATIENT NAME: [name]    MR#:  [id number]  DATE OF BIRTH:  07/18/1945  Had 3 episodes seizures  Back to back this am.so received ativan 2mg  and extradose of keppra 500mg  IV> Non verbal at baseline,  CHIEF COMPLAINT:   Chief Complaint  Patient presents with  . Respiratory Distress   patient is 70 year old who presents to the hospital with complaints of seizure activity. Patient has dementia, not able to provide any review of systems, nonverbal. Neurologist recommended. Brain MRI. Patient's Keppra was advanced. No more seizure activity. Patient and family agreed to go to hospice home tomorrow. Patient underwent last hemodialysis session today. Patient was seen by me during hemodialysis, nonverbal, somnolent, no review of systems was available  REVIEW OF SYSTEMS:    Review of Systems  Unable to perform ROS: dementia    Nutrition:  Tolerating Diet: No Tolerating PT:      DRUG ALLERGIES:   Allergies  Allergen Reactions  . Contrast Media [Iodinated Diagnostic Agents] Anaphylaxis    VITALS:  Blood pressure 120/71, pulse 82, temperature 97.9 F (36.6 C), temperature source Oral, resp. rate 18, height 5\' 6"  (1.676 m), weight 64.4 kg (141 lb 15.6 oz), SpO2 100 %.  PHYSICAL EXAMINATION:   Physical Exam  GENERAL:  70 y.o.-year-old patient lying in the bed with no acute distress. Sitting in the bed, not responding to verbal stimuli, nonverbal, not able to provide review of systems of follow commands. Opens her eyes whenever stimulated with movement of verbally, non-conversive EYES: Pupils equal, round, reactive to light and accommodation. No scleral icterus. Extraocular muscles intact.  HEENT: Head atraumatic, normocephalic. Oropharynx and nasopharynx clear.  NECK:  Supple, no jugular venous distention. No thyroid enlargement, no tenderness.  LUNGS: Normal breath sounds bilaterally, no wheezing, rales,rhonchi or  crepitation. No use of accessory muscles of respiration.  CARDIOVASCULAR: S1, S2 normal. No murmurs, rubs, or gallops.  ABDOMEN: Soft, nontender, nondistended. Bowel sounds present. No organomegaly or mass.  EXTREMITIES: No pedal edema, cyanosis, or clubbing.  NEUROLOGIC: Unable to do full neurological exam because of her dementia. PSYCHIATRIC: Patient is somewhat lethargic, leaning towards the left.  SKIN: No obvious rash, lesion, or ulcer.    LABORATORY PANEL:   CBC  Recent Labs Lab 05/28/15 1121  WBC 6.3  HGB 11.9*  HCT 37.5  PLT 222   ------------------------------------------------------------------------------------------------------------------  Chemistries   Recent Labs Lab 05/23/15 0639  05/28/15 0519  NA  --   < > 137  K  --   < > 5.2*  CL  --   < > 97*  CO2  --   < > 26  GLUCOSE  --   < > 217*  BUN  --   < > 65*  CREATININE  --   < > 5.85*  CALCIUM  --   < > 9.2  MG 2.1  --   --   < > = values in this interval not displayed. ------------------------------------------------------------------------------------------------------------------  Cardiac Enzymes  Recent Labs Lab 05/22/15 1719  TROPONINI 0.15*   ------------------------------------------------------------------------------------------------------------------  RADIOLOGY:  Mr Brain Wo Contrast  05/27/2015  CLINICAL DATA:  Seizure activity. Minimally responsive post seizure. EXAM: MRI HEAD WITHOUT CONTRAST TECHNIQUE: Multiplanar, multiecho pulse sequences of the brain and surrounding structures were obtained without intravenous contrast. COMPARISON:  CT head 05/23/2015. FINDINGS: No evidence for acute infarction, hemorrhage, mass lesion, or extra-axial fluid. Severe generalized atrophy. Extensive white matter disease. Hydrocephalus ex vacuo.  Large remote LEFT occipital PCA infarct. Smaller remote LEFT frontal and parietal infarcts. Small remote RIGHT cerebellar infarct. Flow voids are maintained.  Tiny focus chronic hemorrhage RIGHT cerebellar tonsil. Trace gyriform like chronic hemorrhage in the region of the LEFT occipital chronic infarct. Larger focus of chronic hemorrhage RIGHT basal ganglia/subcortical white matter; altered susceptibility in the region of chronic hemorrhage, with abnormalities on both the gradient sequence and diffusion. No evidence for large vessel occlusion. High-resolution coronal imaging demonstrates atrophic hippocampi by bilaterally. No temporal lobe mass or inflammation. Partial empty sella. Extracranial soft tissues unremarkable. Negative orbits status post cataract extraction. IMPRESSION: Chronic changes as described.  No acute intracranial findings. No evidence for restricted diffusion to suggest post ictal phenomenon or other acute finding of significance. Electronically Signed   By: Elsie Stain M.D.   On: 05/27/2015 14:13     ASSESSMENT AND PLAN:   Principal Problem:   Pulmonary edema Active Problems:   Seizures (HCC)   Hyperkalemia   #1 acute respiratory failure with hypoxia secondary to pulmonary edema in the contest of ESRD: Hemodialysis today, now on 2 L of oxygen through nasal cannula. Received emergency hemodialysis on admission because of hyperkalemia and respiratory distress. Patient and family is agreeable to send her to hospice home tomorrow.  #2 hyperkalemia with EKG changes and also received hemodialysis  Received calcium gluconate, dextrose, insulin. Potassium improved with hemodialysis, however, potassium level is 5.2 again. Patient received her last hemodialysis session today, she is going to go to hospice facility tomorrow.  #3 history of coronary artery disease: Continue Plavix. #4 dementia, MRI of brain revealed chronic changes, no acute intracranial findings, suggesting worsening dementia, family discussed the case with nephrologist and decided to discontinue hemodialysis. Patient will be sent to hospice facility tomorrow for end-of-life  care #6 diabetes mellitus type 2: continue SSI with coverage #7seizures;on Keppra ,dose increased to  po daily,MRI brain done and showed no acute changes and seen by neuro,continue seizure precautions, current Keppra dose, per neurologist, appreciate input. 8  hypertension ;controlled.  DNR  All the records are reviewed and case discussed with Care Management/Social Workerr. Management plans discussed with the patient, family and they are in agreement.  CODE STATUS: DNR TOTAL TIME TAKING CARE OF THIS PATIENT: 35 minutes.   D/C tomorrow to hospice facility.   Katharina Caper M.D on 05/28/2015 at 5:08 PM  Between 7am to 6pm - Pager - [phone]  After 6pm go to [email or web address] - password [email or web address] [facility]  [facility] [facility]  Office  [phone]  CC: Primary care physician; Amy Diana Eves, NP

## 2015-05-29 LAB — GLUCOSE, CAPILLARY
Glucose-Capillary: 179 mg/dL — ABNORMAL HIGH (ref 65–99)
Glucose-Capillary: 279 mg/dL — ABNORMAL HIGH (ref 65–99)

## 2015-05-29 MED ORDER — MORPHINE SULFATE 20 MG/5ML PO SOLN: 5.0000 mg | ORAL | Status: AC | PRN

## 2015-05-29 MED ORDER — LEVETIRACETAM 500 MG PO TABS: 1000.0000 mg | ORAL_TABLET | Freq: Every day | ORAL | Status: AC

## 2015-05-29 NOTE — Progress Notes (Signed)
Patient discharged to hospice home and report called to admitting nurse. Patient is alert to self, nonverbal took AM meds crushed in applesauce. Son in law at the bedside and patient daughter on her way to hospice home to sign admitting paperwork. EMS called to pick up patient and take patient to hospice home.

## 2015-05-29 NOTE — Clinical Social Work Placement (Signed)
   CLINICAL SOCIAL WORK PLACEMENT  NOTE  Date:  05/29/2015  Patient Details  Name: [name] MRN: [id number] Date of Birth: 08/31/1945  Clinical Social Work is seeking post-discharge placement for this patient at the  [facility](Hospice Home) level of care (*CSW will initial, date and re-position this form in  chart as items are completed):  Yes   Patient/family provided with [facility] Clinical Social Work Department's list of facilities offering this level of care within the geographic area requested by the patient (or if unable, by the patient's family).  Yes   Patient/family informed of their freedom to choose among providers that offer the needed level of care, that participate in Medicare, Medicaid or managed care program needed by the patient, have an available bed and are willing to accept the patient.      Patient/family informed of 's ownership interest in [facility] and [facility], as well as of the fact that they are under no obligation to receive care at these facilities.  PASRR submitted to EDS on       PASRR number received on       Existing PASRR number confirmed on       FL2 transmitted to all facilities in geographic area requested by pt/family on       FL2 transmitted to all facilities within larger geographic area on       Patient informed that his/her managed care company has contracts with or will negotiate with certain facilities, including the following:            Patient/family informed of bed offers received.  Patient chooses bed at       Physician recommends and patient chooses bed at      Patient to be transferred to  [facility](Hospice Home) on 05/29/15.  Patient to be transferred to facility by  (EMS)     Patient family notified on 05/29/15 of transfer.  Name of family member notified:   Herbalist([name] (Daughter/Legal Guardian) [phone])     PHYSICIAN       Additional Comment:     _______________________________________________ Starr SinclairSamantha L Raekwan Spelman, [name] 05/29/2015, 11:31 AM

## 2015-05-29 NOTE — Progress Notes (Signed)
Spoke with Dr.Hower about IV going bad and IV keppra scheduled to infuse. Orders to leave IV out  And discontinue IV keppra. [name],[name] M

## 2015-05-29 NOTE — Progress Notes (Signed)
CSW called Hospice Home and spoke with August Luzebbie Wknd Admission Coordinator at [phone] to confirm patient admission to facility. Per Eunice Blaseebbie, patient has been accepted to facility and can be transported to facility today. Discharge summary faxed to facility [phone] and received by Debbie. Discharge packet completed and given to nurse Clydie BraunKaren. Patient room number is 12, and contact for report is Jasmine DecemberSharon @ [phone]. D/c summary, prescriptions, and DNR placed in packet. Patient was sleeping. CSW notified patient daughter/legal guardian Randye Loboleanor Love of d/c via phone. Daughter was in agreeance to discharge plan.    Sherryl MangesSamantha Elanor Cale, BSW, MSW, LCSWA Clinical Social Work Dept [phone]

## 2015-05-29 NOTE — Discharge Summary (Signed)
[facility] - [place] at [facility]   PATIENT NAME: [name]    MR#:  [id number]  DATE OF BIRTH:  11-Apr-1945  DATE OF ADMISSION:  05/22/2015 ADMITTING PHYSICIAN: Altamese Dilling, MD  DATE OF DISCHARGE: No discharge date for patient encounter.  PRIMARY CARE PHYSICIAN: Amy L Krebs, NP    ADMISSION DIAGNOSIS:  Hyperkalemia [E87.5] Acute pulmonary edema (HCC) [J81.0] Hypoxia [R09.02]  DISCHARGE DIAGNOSIS:  Acute pulmonary edema secondary to volume overload End-stage renal disease on dialysis Hyperkalemia-resolved Seizures  SECONDARY DIAGNOSIS:   Past Medical History  Diagnosis Date  . Stroke (HCC)   . MI (myocardial infarction) (HCC)   . Hearing impaired   . Hyperlipidemia   . Hypertension   . Diabetes mellitus without complication (HCC)   . Depression   . Coronary artery disease   . Dementia   . Secondary hyperparathyroidism (HCC)   . Chronic systolic CHF (congestive heart failure) (HCC)   . GERD (gastroesophageal reflux disease)   . Personal history of transient ischemic attack (TIA) and cerebral infarction without residual deficit   . Anemia   . Moderate aortic stenosis     a. echo 04/2014: 40-50%, moderate AS b. echo 01/2015: EF 35-40%, Severe AS, mild MR, moderate TR  . ESRD (end stage renal disease) (HCC)     a. Dialysis MWF    HOSPITAL COURSE:  [name]  is a 70 y.o. female admitted 05/22/2015 with chief complaint shortness of breath. On presentation to the emergency department she was noted be in respiratory distress with lab work revealing hyperkalemia. She underwent urgent hemodialysis to correct these issues. During her hospitalization she had a recurrent seizure activity evaluated by neurology who recommended increased dosages of Keppra. Given the patient's advanced dementia, recurrent seizure activity, end-stage renal disease on dialysis, the decision was made to have her transition to hospice home. She'll will not  continue hemodialysis, with main goal of symptom management.  DISCHARGE CONDITIONS:   Fair-discharge to hospice  CONSULTS OBTAINED:  Treatment Team:  Pauletta Browns, MD Wyatt Haste, MD  DRUG ALLERGIES:   Allergies  Allergen Reactions  . Contrast Media [Iodinated Diagnostic Agents] Anaphylaxis    DISCHARGE MEDICATIONS:   Current Discharge Medication List    START taking these medications   Details  morphine 20 MG/5ML solution Take 1.3 mLs (5.2 mg total) by mouth every 2 (two) hours as needed for pain. Qty: 100 mL, Refills: 0      CONTINUE these medications which have CHANGED   Details  levETIRAcetam (KEPPRA) 500 MG tablet Take 2 tablets (1,000 mg total) by mouth daily. Qty: 30 tablet, Refills: 0      CONTINUE these medications which have NOT CHANGED   Details  bisacodyl (DULCOLAX) 10 MG suppository Place 1 suppository (10 mg total) rectally daily as needed for moderate constipation. Qty: 12 suppository, Refills: 0    docusate sodium (COLACE) 100 MG capsule Take 100 mg by mouth at bedtime.     lidocaine-prilocaine (EMLA) cream Apply 1 application topically as needed (prior to dialysis on Monday, Wednesday, and Friday).      STOP taking these medications     aspirin EC 81 MG tablet      atorvastatin (LIPITOR) 80 MG tablet      calcium acetate (PHOSLO) 667 MG capsule      carvedilol (COREG) 3.125 MG tablet      cholecalciferol (VITAMIN D) 1000 units tablet      clopidogrel (PLAVIX) 75 MG tablet  Cranberry 450 MG TABS      insulin glargine (LANTUS) 100 UNIT/ML injection      insulin lispro (HUMALOG) 100 UNIT/ML injection      iron polysaccharides (NIFEREX) 150 MG capsule      Lactobacillus (ACIDOPHILUS) CAPS capsule      Nutritional Supplements (FEEDING SUPPLEMENT, NEPRO CARB STEADY,) LIQD      ranitidine (ZANTAC) 150 MG tablet          DISCHARGE INSTRUCTIONS:    DIET:  Regular diet  DISCHARGE CONDITION:  Fair  ACTIVITY:   Activity as tolerated  OXYGEN:  Home Oxygen: No.   Oxygen Delivery: room air  DISCHARGE LOCATION:  Hospice  If you experience worsening of your admission symptoms, develop shortness of breath, life threatening emergency, suicidal or homicidal thoughts you must seek medical attention immediately by calling 911 or calling your MD immediately  if symptoms less severe.  You Must read complete instructions/literature along with all the possible adverse reactions/side effects for all the Medicines you take and that have been prescribed to you. Take any new Medicines after you have completely understood and accpet all the possible adverse reactions/side effects.   Please note  You were cared for by a hospitalist during your hospital stay. If you have any questions about your discharge medications or the care you received while you were in the hospital after you are discharged, you can call the unit and asked to speak with the hospitalist on call if the hospitalist that took care of you is not available. Once you are discharged, your primary care physician will handle any further medical issues. Please note that NO REFILLS for any discharge medications will be authorized once you are discharged, as it is imperative that you return to your primary care physician (or establish a relationship with a primary care physician if you do not have one) for your aftercare needs so that they can reassess your need for medications and monitor your lab values.    On the day of Discharge:   VITAL SIGNS:  Blood pressure 131/76, pulse 91, temperature 98.1 F (36.7 C), temperature source Oral, resp. rate 16, height  (1.676 m), weight 64.592 kg (142 lb 6.4 oz), SpO2 94 %.  I/O:   Intake/Output Summary (Last 24 hours) at 05/29/15 1019 Last data filed at 05/29/15 0900  Gross per 24 hour  Intake    360 ml  Output   1000 ml  Net   -640 ml    PHYSICAL EXAMINATION:  GENERAL:  70 y.o.-year-old patient  lying in the bed with no acute distress.  EYES: Pupils equal, round, reactive to light and accommodation. No scleral icterus. Extraocular muscles intact.  HEENT: Head atraumatic, normocephalic. Oropharynx and nasopharynx clear.  NECK:  Supple, no jugular venous distention. No thyroid enlargement, no tenderness.  LUNGS: Normal breath sounds bilaterally, no wheezing, rales,rhonchi or crepitation. No use of accessory muscles of respiration.  CARDIOVASCULAR: S1, S2 normal. No murmurs, rubs, or gallops.  ABDOMEN: Soft, non-tender, non-distended. Bowel sounds present. No organomegaly or mass.  EXTREMITIES: No pedal edema, cyanosis, or clubbing. -Unna boots present NEUROLOGIC: Unable to fully assess given patient's mental status medical conditionPSYCHIATRIC: Psychiatric-unable to fully assess given patient's mental status medical condition she is lethargic and unable to answer questions or follow commands at this time  SKIN: No obvious rash, lesion, or ulcer.   DATA REVIEW:   CBC  Recent Labs Lab 05/28/15 1121  WBC 6.3  HGB 11.9*  HCT 37.5  PLT 222    Chemistries   Recent Labs Lab 05/23/15 0639  05/28/15 0519  NA  --   < > 137  K  --   < > 5.2*  CL  --   < > 97*  CO2  --   < > 26  GLUCOSE  --   < > 217*  BUN  --   < > 65*  CREATININE  --   < > 5.85*  CALCIUM  --   < > 9.2  MG 2.1  --   --   < > = values in this interval not displayed.  Cardiac Enzymes  Recent Labs Lab 05/22/15 1719  TROPONINI 0.15*    Microbiology Results  Results for orders placed or performed during the hospital encounter of 05/22/15  MRSA PCR Screening     Status: None   Collection Time: 05/22/15  9:32 PM  Result Value Ref Range Status   MRSA by PCR NEGATIVE NEGATIVE Final    Comment:        The GeneXpert MRSA Assay (FDA approved for NASAL specimens only), is one component of a comprehensive MRSA colonization surveillance program. It is not intended to diagnose MRSA infection nor to guide  or monitor treatment for MRSA infections.     RADIOLOGY:  Mr Sherrin DaisyBrain Wo Contrast  05/27/2015  CLINICAL DATA:  Seizure activity. Minimally responsive post seizure. EXAM: MRI HEAD WITHOUT CONTRAST TECHNIQUE: Multiplanar, multiecho pulse sequences of the brain and surrounding structures were obtained without intravenous contrast. COMPARISON:  CT head 05/23/2015. FINDINGS: No evidence for acute infarction, hemorrhage, mass lesion, or extra-axial fluid. Severe generalized atrophy. Extensive white matter disease. Hydrocephalus ex vacuo. Large remote LEFT occipital PCA infarct. Smaller remote LEFT frontal and parietal infarcts. Small remote RIGHT cerebellar infarct. Flow voids are maintained. Tiny focus chronic hemorrhage RIGHT cerebellar tonsil. Trace gyriform like chronic hemorrhage in the region of the LEFT occipital chronic infarct. Larger focus of chronic hemorrhage RIGHT basal ganglia/subcortical white matter; altered susceptibility in the region of chronic hemorrhage, with abnormalities on both the gradient sequence and diffusion. No evidence for large vessel occlusion. High-resolution coronal imaging demonstrates atrophic hippocampi by bilaterally. No temporal lobe mass or inflammation. Partial empty sella. Extracranial soft tissues unremarkable. Negative orbits status post cataract extraction. IMPRESSION: Chronic changes as described.  No acute intracranial findings. No evidence for restricted diffusion to suggest post ictal phenomenon or other acute finding of significance. Electronically Signed   By: Elsie StainJohn T Curnes M.D.   On: 05/27/2015 14:13     Management plans discussed with the patient, family and they are in agreement.  CODE STATUS:     Code Status Orders        Start     Ordered   05/22/15 2135  Do not attempt resuscitation (DNR)   Continuous    Question Answer Comment  In the event of cardiac or respiratory ARREST Do not call a "code blue"   In the event of cardiac or respiratory  ARREST Do not perform Intubation, CPR, defibrillation or ACLS   In the event of cardiac or respiratory ARREST Use medication by any route, position, wound care, and other measures to relive pain and suffering. May use oxygen, suction and manual treatment of airway obstruction as needed for comfort.   Comments confirmed with her HCPOA- daughter      05/22/15 2135    Code Status History    Date Active Date Inactive Code Status Order ID Comments User Context  05/03/2015  2:25 AM 05/05/2015  9:20 PM DNR [id number]  Arnaldo Natal, MD Inpatient   04/28/2015  3:13 PM 04/30/2015  9:07 PM DNR [id number]  Wyatt Haste, MD ED   04/13/2015  6:18 AM 04/21/2015  7:12 PM Full Code [id number]  Oralia Manis, MD Inpatient   02/04/2015  5:09 PM 02/23/2015  9:12 PM Full Code [id number]  Altamese Dilling, MD Inpatient   02/04/2015 12:22 PM 02/04/2015  5:09 PM Full Code [id number]  Annice Needy, MD Inpatient   12/02/2014  5:37 PM 12/05/2014  6:47 PM Full Code [id number]  Houston Siren, MD Inpatient      TOTAL TIME TAKING CARE OF THIS PATIENT: 33 minutes.    Marrie Chandra,  Mardi Mainland.D on 05/29/2015 at 10:19 AM  Between 7am to 6pm - Pager - [phone]  After 6pm go to [email or web address] - password [email or web address] [facility]  [place] [facility]  Office  [phone]  CC: Primary care physician; Amy Diana Eves, NP

## 2015-06-03 ENCOUNTER — Telehealth: Payer: Self-pay | Admitting: Family Medicine

## 2015-06-03 NOTE — Telephone Encounter (Signed)
Jerrie from Hospice called to report the death of patient.  Her call back number is [phone]

## 2015-06-19 DEATH — deceased

## 2016-05-25 IMAGING — CT CT HEAD W/O CM
2 series · 17 of 30 positions shown, 20 images · non-contrast
Comparison: 02/06/2015

CLINICAL DATA: Altered mental status

EXAM:
CT HEAD WITHOUT CONTRAST
TECHNIQUE: Contiguous axial images were obtained from the base of the skull
through the vertex without intravenous contrast.

[Series 2: head wo · axial · 0.39mm/px · z∈[-148,-78]mm · 5 of 31 slices shown]
[im 3/31  brain]
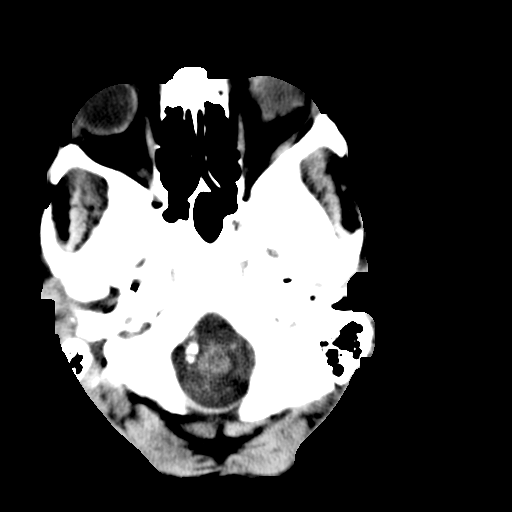
[im 7/31  brain]
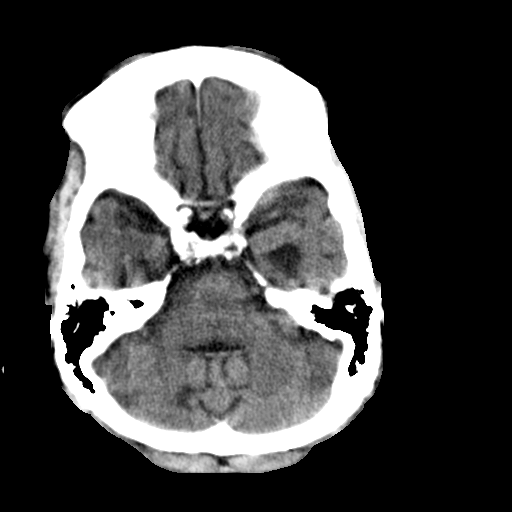
[im 11/31  brain]
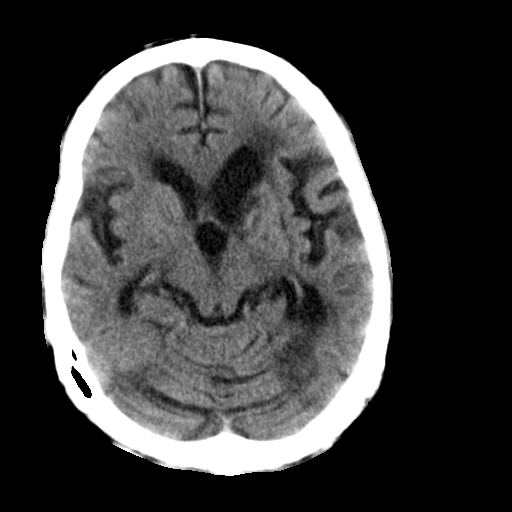
[im 15/31  brain]
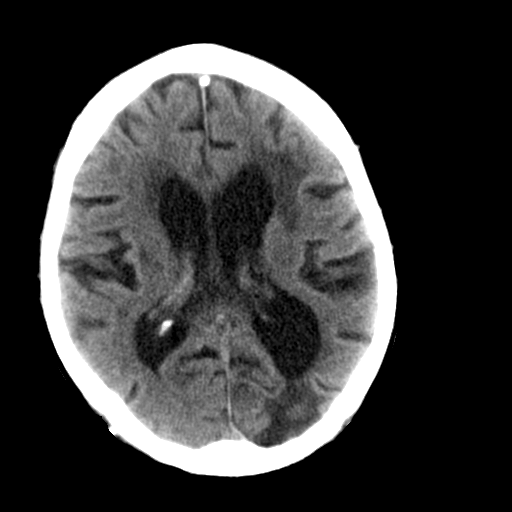
[im 17/31  brain]
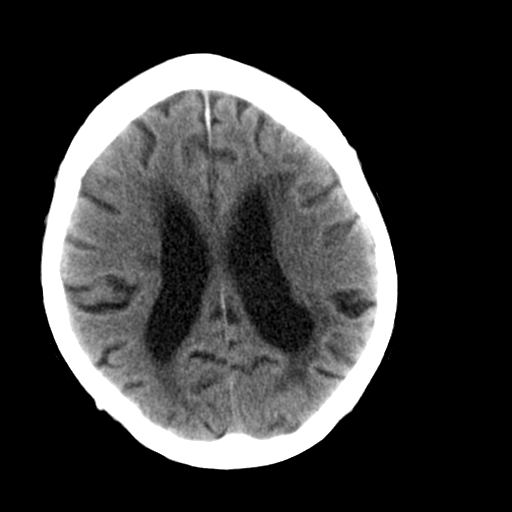

[Series 4: head wo recon · axial · 0.39mm/px · z∈[-136,-21]mm · 12 of 29 slices shown, 15 images]
[im 3/29  brain]
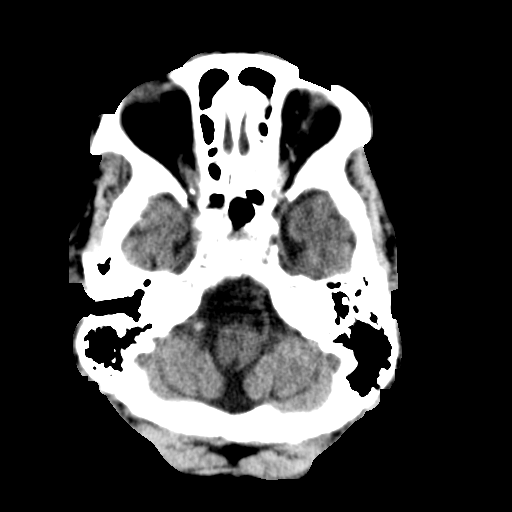
[im 3/29  bone]
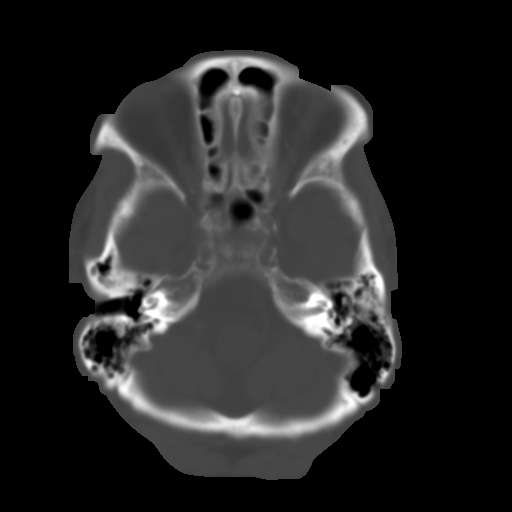
[im 5/29  brain]
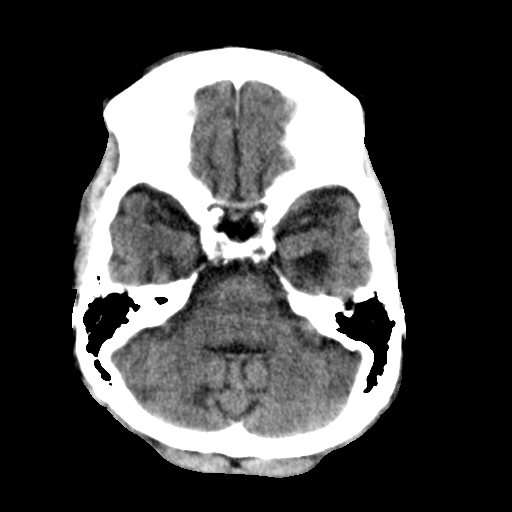
[im 7/29  brain]
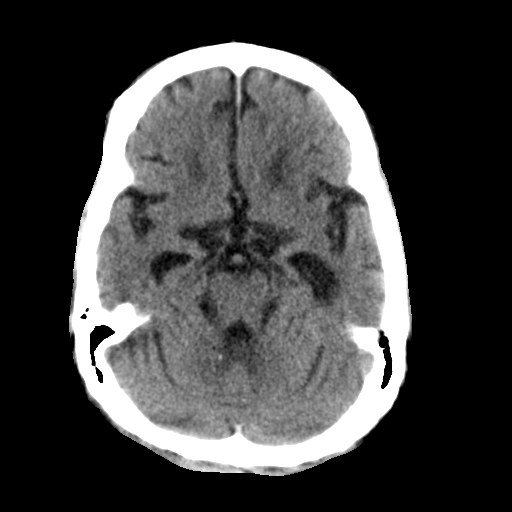
[im 9/29  brain]
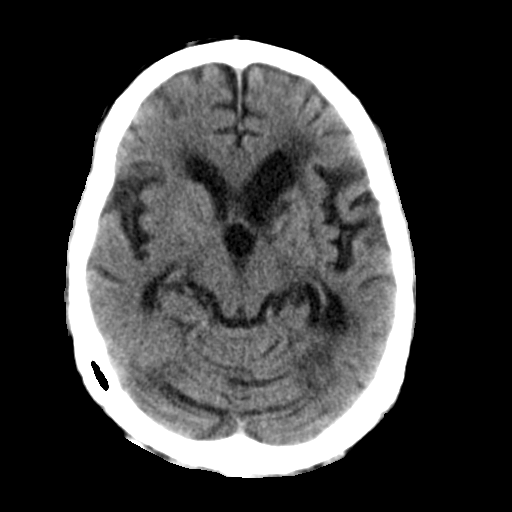
[im 11/29  brain]
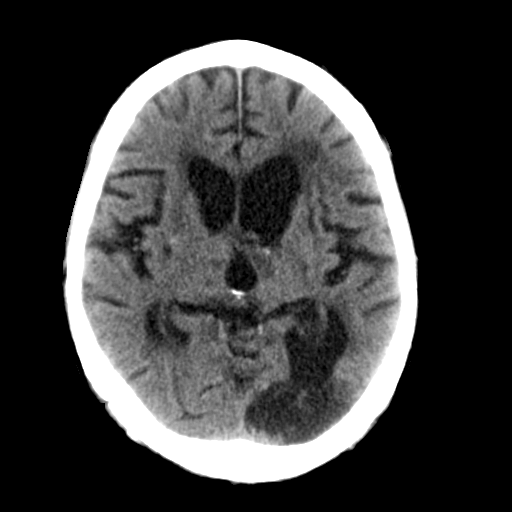
[im 11/29  bone]
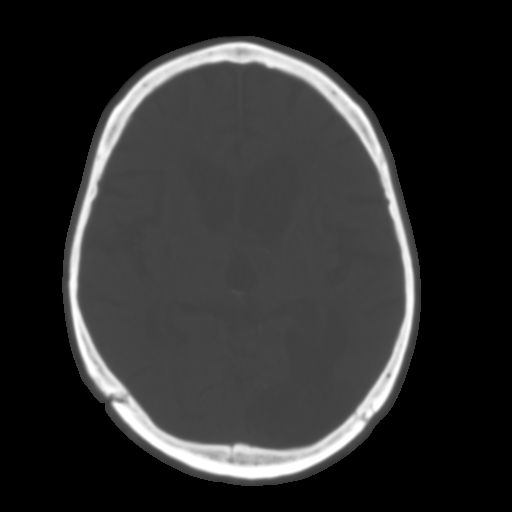
[im 13/29  brain]
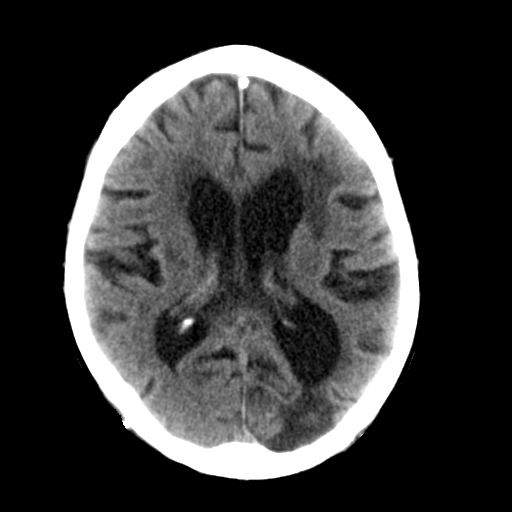
[im 16/29  brain]
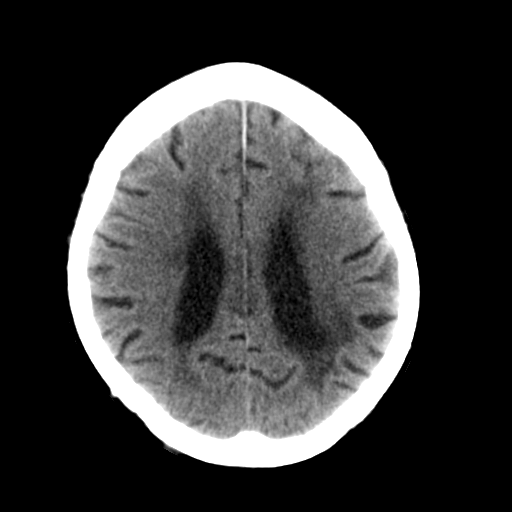
[im 18/29  brain]
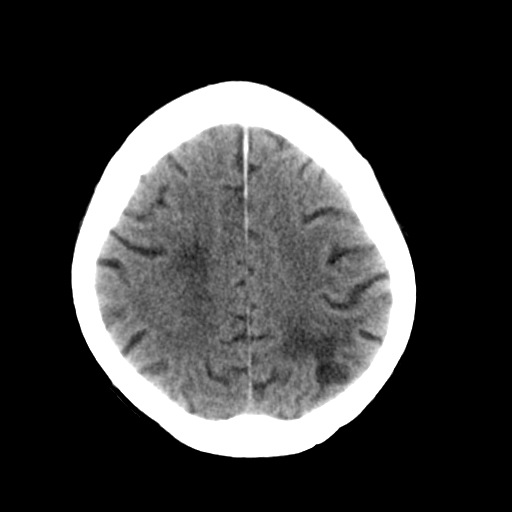
[im 20/29  brain]
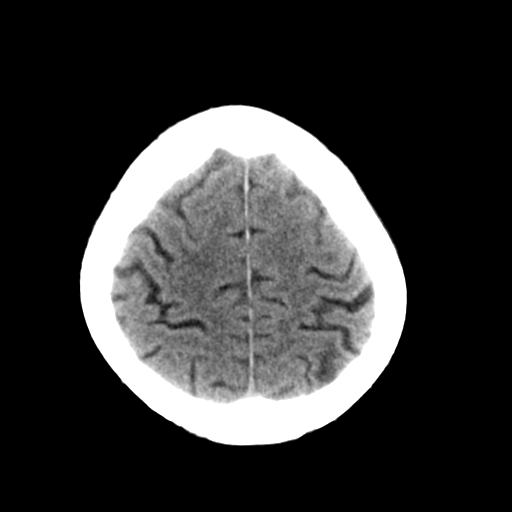
[im 20/29  bone]
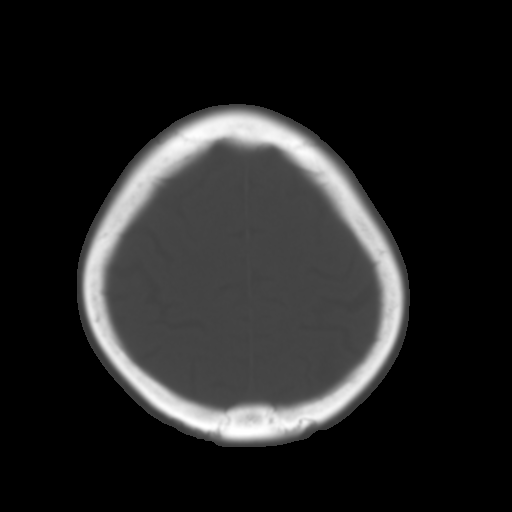
[im 22/29  brain]
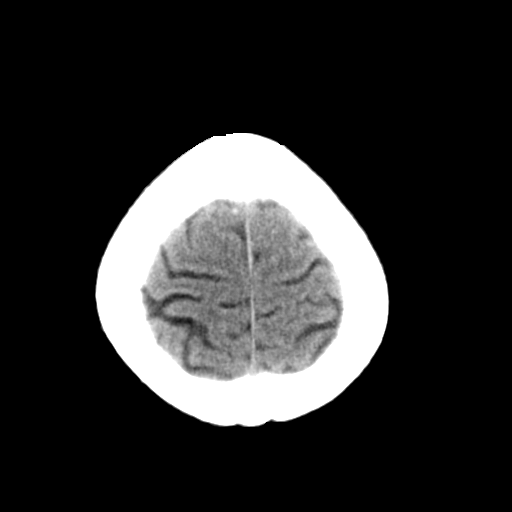
[im 24/29  brain]
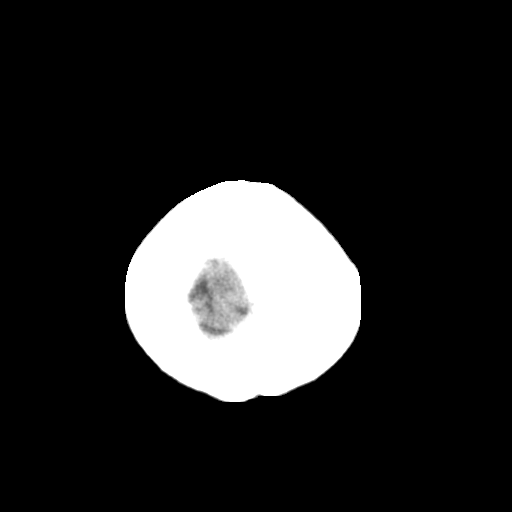
[im 26/29  brain]
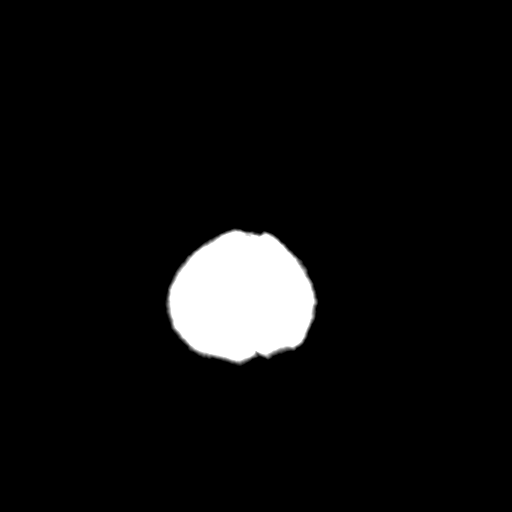

[17 of 30 positions shown; findings below may reference images not displayed]

FINDINGS: No skull fracture is noted. Paranasal sinuses and mastoid air cells
are unremarkable. Extensive atherosclerotic calcifications of
carotid siphon again noted. Stable atrophy and chronic white matter
disease. Again noted left parietal and left occipital
encephalomalacia. No definite acute cortical infarction. No mass
lesion is noted on this unenhanced scan.
IMPRESSION: No acute intracranial abnormality. No definite acute cortical
infarction. Stable atrophy and chronic white matter disease. Stable
old infarcts in left parietal and left occipital lobe

## 2016-05-25 IMAGING — CR DG CHEST 1V
1 series · 1 of 1 positions shown · non-contrast
Comparison: 02/09/2015 and earlier.

CLINICAL DATA: 69-year-old [HOSPITAL] patient with end-stage
renal disease, chronic systolic CHF, with recent illness, now with
acute mental status changes.

EXAM:
Portable CHEST 1 VIEW

[ap]
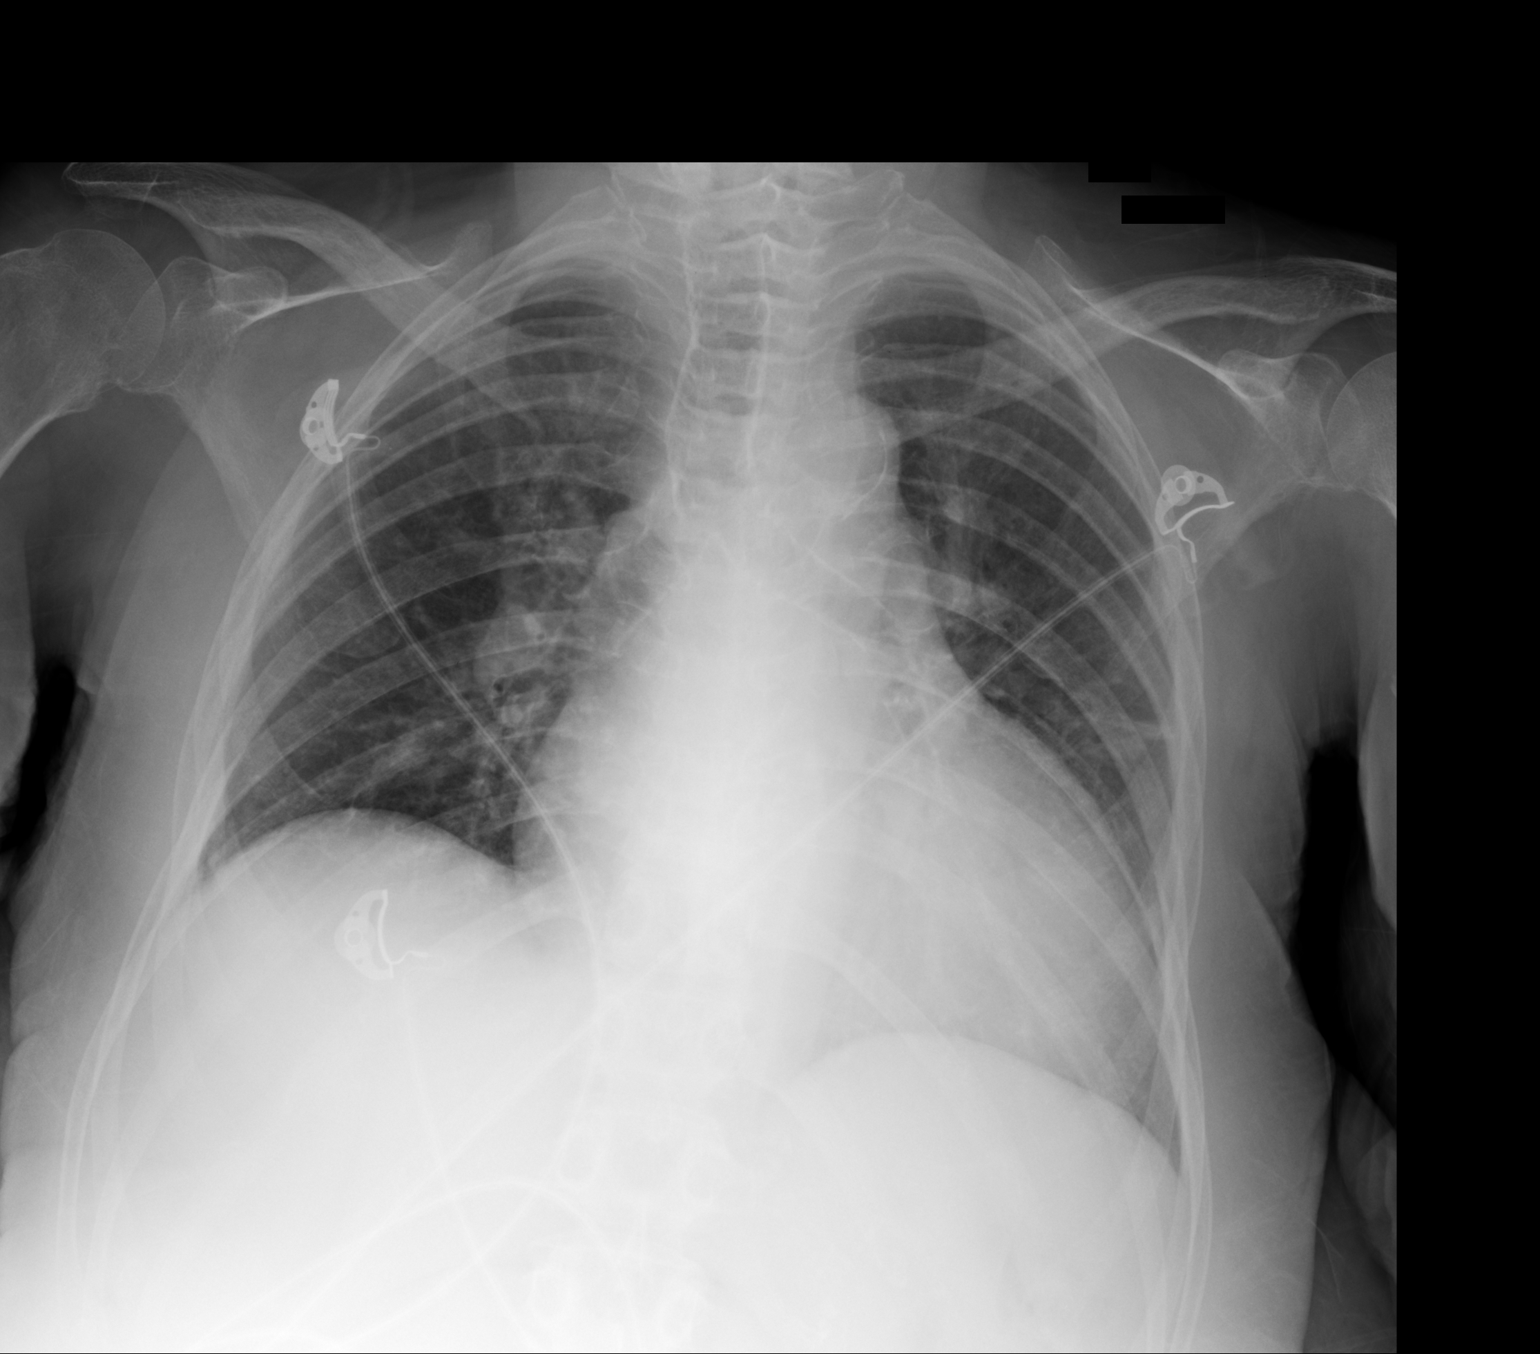

[1 of 1 positions shown; findings below may reference images not displayed]

FINDINGS: Cardiac silhouette moderately to markedly enlarged, unchanged. Mild
pulmonary venous hypertension without overt edema currently. Linear
scarring in the inferior left upper lobe, unchanged. Mild
atelectasis at the right lung base. Lungs otherwise clear. No
visible pleural effusions. Stable chronic elevation of the right
hemidiaphragm.
IMPRESSION: 1. Mild right basilar atelectasis. No acute cardiopulmonary disease
otherwise.
2. Stable moderate to marked cardiomegaly without evidence of
pulmonary edema.

## 2016-08-16 IMAGING — DX DG CHEST 1V PORT
1 series · 1 of 1 positions shown · non-contrast
Comparison: Portable exam 2902 hours compared to 05/02/2015

CLINICAL DATA: Seizure activity today, end-stage renal disease,
unresponsive, history coronary artery disease post MI, stroke,
hypertension, hyperlipidemia, diabetes mellitus, chronic systolic
CHF

EXAM:
PORTABLE CHEST 1 VIEW

[chest ap]
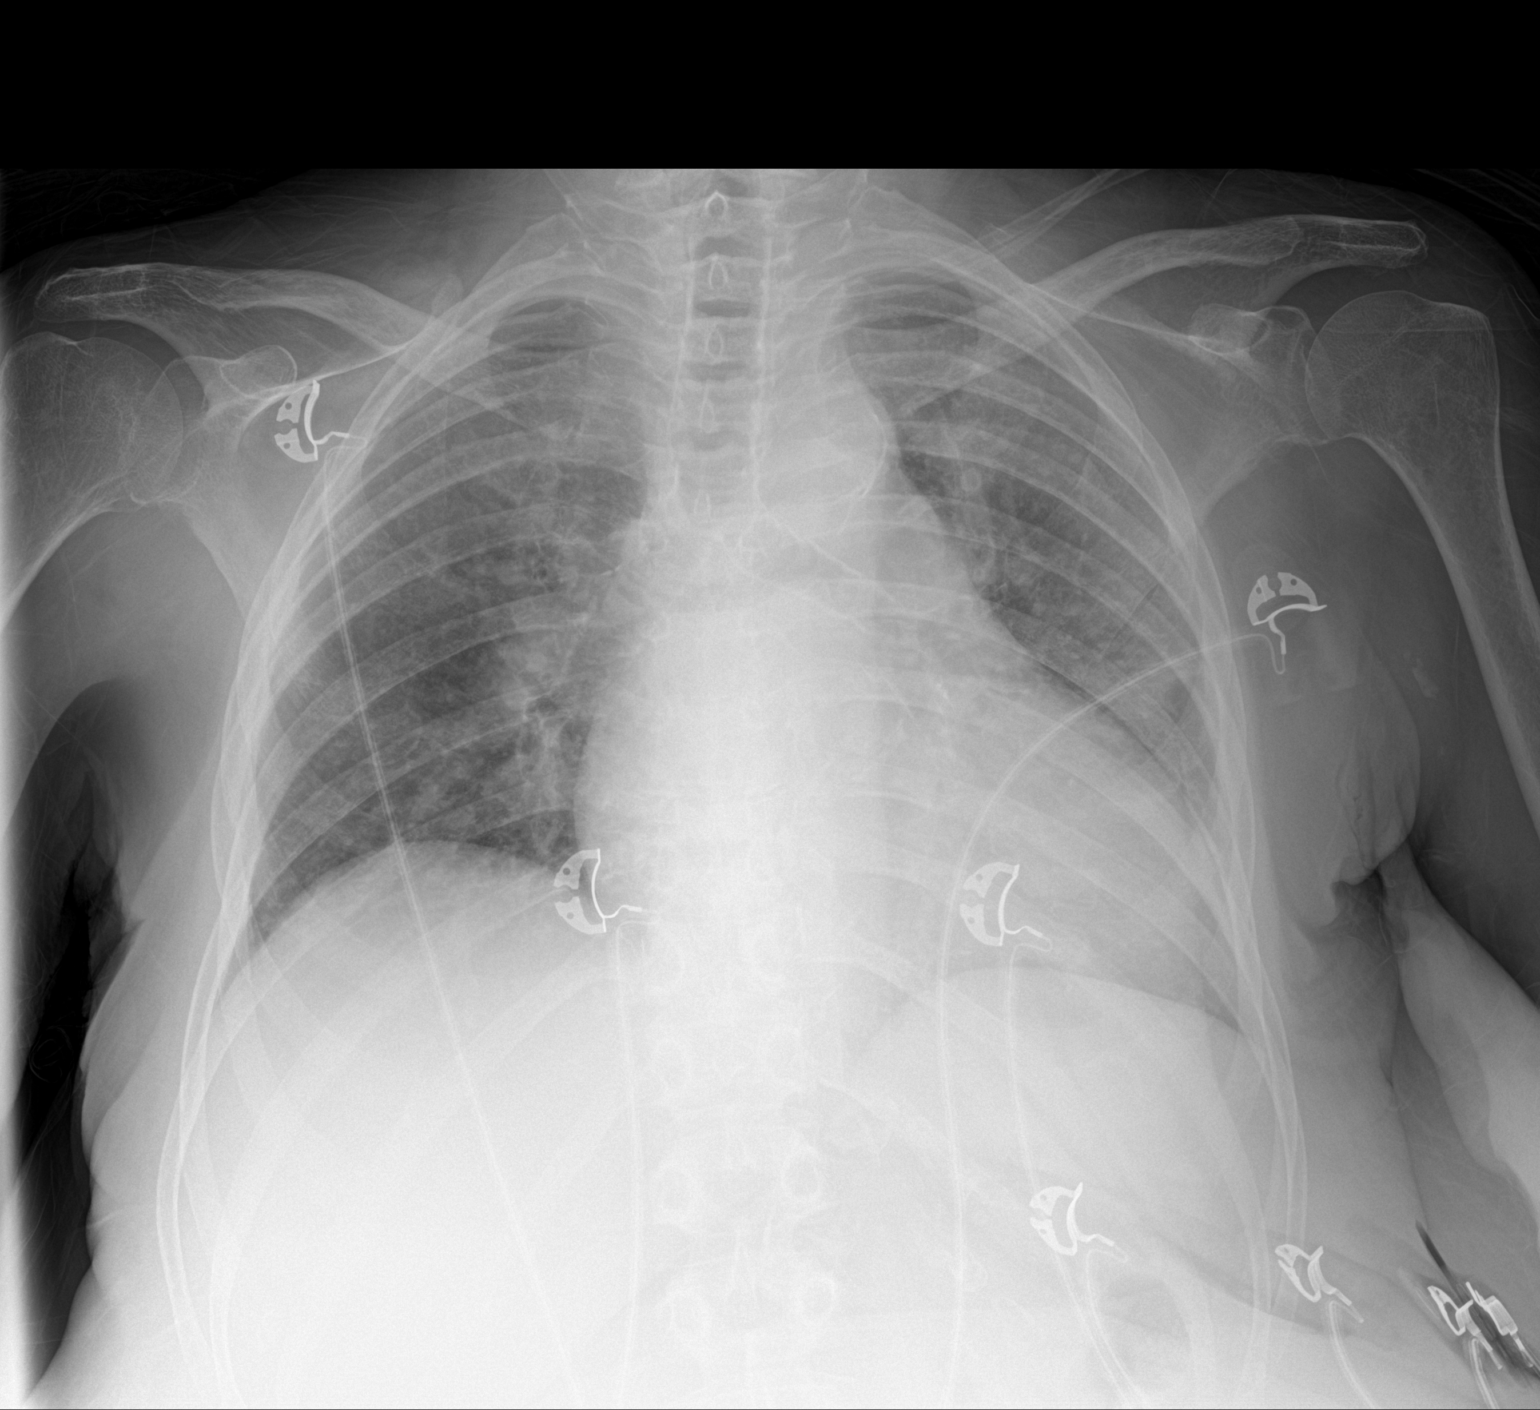

[1 of 1 positions shown; findings below may reference images not displayed]

FINDINGS: Enlargement of cardiac silhouette with pulmonary vascular
congestion.

Hazy perihilar infiltrates suggesting pulmonary edema, question CHF.

Minimal central peribronchial thickening.

No pleural effusion or pneumothorax.

Bones demineralized.

Atherosclerotic calcification aorta.
IMPRESSION: Enlargement of cardiac silhouette with vascular congestion and
subtle perihilar infiltrates favoring pulmonary edema and CHF
